# Patient Record
Sex: Female | Born: 1990
Health system: Southern US, Community
[De-identification: ages and names within clinical notes are randomized; demographics above are authoritative.]

## PROBLEM LIST (undated history)

## (undated) DIAGNOSIS — R51 Headache: Secondary | ICD-10-CM

## (undated) DIAGNOSIS — F419 Anxiety disorder, unspecified: Secondary | ICD-10-CM

## (undated) DIAGNOSIS — K589 Irritable bowel syndrome without diarrhea: Secondary | ICD-10-CM

## (undated) DIAGNOSIS — N939 Abnormal uterine and vaginal bleeding, unspecified: Secondary | ICD-10-CM

## (undated) DIAGNOSIS — D8989 Other specified disorders involving the immune mechanism, not elsewhere classified: Secondary | ICD-10-CM

## (undated) DIAGNOSIS — G43909 Migraine, unspecified, not intractable, without status migrainosus: Secondary | ICD-10-CM

## (undated) DIAGNOSIS — E282 Polycystic ovarian syndrome: Secondary | ICD-10-CM

## (undated) DIAGNOSIS — J309 Allergic rhinitis, unspecified: Secondary | ICD-10-CM

## (undated) DIAGNOSIS — R519 Headache, unspecified: Secondary | ICD-10-CM

## (undated) HISTORY — DX: Polycystic ovarian syndrome: E28.2

## (undated) HISTORY — DX: Other specified disorders involving the immune mechanism, not elsewhere classified: D89.89

## (undated) HISTORY — PX: TONSILLECTOMY: SUR1361

## (undated) HISTORY — DX: Headache: R51

## (undated) HISTORY — DX: Migraine, unspecified, not intractable, without status migrainosus: G43.909

## (undated) HISTORY — DX: Headache, unspecified: R51.9

## (undated) HISTORY — DX: Allergic rhinitis, unspecified: J30.9

---

## 2009-01-06 ENCOUNTER — Emergency Department: Payer: Self-pay | Admitting: Emergency Medicine

## 2009-01-30 ENCOUNTER — Emergency Department: Payer: Self-pay | Admitting: Emergency Medicine

## 2009-03-18 HISTORY — PX: TONSILLECTOMY: SUR1361

## 2016-07-01 DIAGNOSIS — F32A Depression, unspecified: Secondary | ICD-10-CM | POA: Insufficient documentation

## 2016-09-10 ENCOUNTER — Encounter (HOSPITAL_COMMUNITY): Payer: Self-pay

## 2016-09-10 ENCOUNTER — Emergency Department (HOSPITAL_COMMUNITY)
Admission: EM | Admit: 2016-09-10 | Discharge: 2016-09-10 | Disposition: A | Discharge status: Home / Self Care | Payer: PRIVATE HEALTH INSURANCE | Attending: Emergency Medicine | Admitting: Emergency Medicine

## 2016-09-10 DIAGNOSIS — M1990 Unspecified osteoarthritis, unspecified site: Secondary | ICD-10-CM | POA: Diagnosis not present

## 2016-09-10 DIAGNOSIS — M79673 Pain in unspecified foot: Secondary | ICD-10-CM

## 2016-09-10 DIAGNOSIS — M26629 Arthralgia of temporomandibular joint, unspecified side: Secondary | ICD-10-CM | POA: Diagnosis present

## 2016-09-10 DIAGNOSIS — M79643 Pain in unspecified hand: Secondary | ICD-10-CM

## 2016-09-10 HISTORY — DX: Anxiety disorder, unspecified: F41.9

## 2016-09-10 HISTORY — DX: Irritable bowel syndrome, unspecified: K58.9

## 2016-09-10 MED ORDER — ACETAMINOPHEN 500 MG PO TABS
1000.0000 mg | ORAL_TABLET | Freq: Once | ORAL | Status: AC
  Administered 2016-09-10: 1000 mg via ORAL
  Filled 2016-09-10: qty 2

## 2016-09-10 MED ORDER — OXYCODONE HCL 5 MG PO TABS
5.0000 mg | ORAL_TABLET | Freq: Once | ORAL | Status: AC
  Administered 2016-09-10: 5 mg via ORAL
  Filled 2016-09-10: qty 1

## 2016-09-10 MED ORDER — KETOROLAC TROMETHAMINE 60 MG/2ML IM SOLN
15.0000 mg | Freq: Once | INTRAMUSCULAR | Status: AC
  Administered 2016-09-10: 15 mg via INTRAMUSCULAR
  Filled 2016-09-10: qty 2

## 2016-09-10 NOTE — ED Provider Notes (Signed)
[facility]-EMER[facility]ENCY DEPT Provider Note   CSN: [id number] Arrival date & time: 09/10/16  1904     History   Chief Complaint Chief Complaint  Patient presents with  . Temporomandibular Joint Pain    HPI Jennifer Vincent is a 26 y.o. female.  26 yo F with a chief complaint of hand and foot pain. This been going on for the past week or so. Denies fevers or chills. Denies rash. Denies sore throat. Denies sick contacts. Denies vomiting or diarrhea. Denies chest pain shortness breath abdominal pain. Denies prior to exposure. She called her father who is a physician who told her that she needed to see someone for this. She went to urgent care and they sent her here for evaluation.   The history is provided by the patient and the spouse.  Illness  This is a new problem. The current episode started more than 1 week ago. The problem occurs constantly. The problem has been gradually worsening. Pertinent negatives include no chest pain, no headaches and no shortness of breath. Nothing aggravates the symptoms. Nothing relieves the symptoms. She has tried nothing for the symptoms. The treatment provided no relief.    Past Medical History:  Diagnosis Date  . Anxiety   . IBS (irritable bowel syndrome)     There are no active problems to display for this patient.   Past Surgical History:  Procedure Laterality Date  . TONSILLECTOMY      OB History    No data available       Home Medications    Prior to Admission medications   Not on File    Family History No family history on file.  Social History Social History  Substance Use Topics  . Smoking status: Never Smoker  . Smokeless tobacco: Never Used  . Alcohol use Yes     Allergies   Patient has no allergy information on record.   Review of Systems Review of Systems  Constitutional: Negative for chills and fever.  HENT: Negative for congestion and rhinorrhea.   Eyes: Negative for redness and visual disturbance.  Respiratory:  Negative for shortness of breath and wheezing.   Cardiovascular: Negative for chest pain and palpitations.  Gastrointestinal: Negative for nausea and vomiting.  Genitourinary: Negative for dysuria and urgency.  Musculoskeletal: Negative for arthralgias and myalgias.  Skin: Negative for pallor and wound.  Neurological: Negative for dizziness and headaches.     Physical Exam Updated Vital Signs BP 119/77   Pulse 88   Temp 98.8 F (37.1 C) (Oral)   Resp 16   LMP 08/27/2016   SpO2 99%   Physical Exam  Constitutional: She is oriented to person, place, and time. She appears well-developed and well-nourished. No distress.  HENT:  Head: Normocephalic and atraumatic.  Eyes: EOM are normal. Pupils are equal, round, and reactive to light.  Neck: Normal range of motion. Neck supple.  Cardiovascular: Normal rate and regular rhythm.  Exam reveals no gallop and no friction rub.   No murmur heard. Pulmonary/Chest: Effort normal. She has no wheezes. She has no rales.  Abdominal: Soft. She exhibits no distension and no mass. There is no tenderness. There is no guarding.  Musculoskeletal: She exhibits no edema or tenderness.  Mild erythema to the palms and soles. No overt rash. No pain with range of motion on exam. No edema.  Neurological: She is alert and oriented to person, place, and time.  Skin: Skin is warm and dry. She is not diaphoretic.  Psychiatric:  She has a normal mood and affect. Her behavior is normal.  Nursing note and vitals reviewed.    ED Treatments / Results  Labs (all labs ordered are listed, but only abnormal results are displayed) Labs Reviewed - No data to display  EKG  EKG Interpretation  Date/Time:  Tuesday September 10 2016 19:09:40 EDT Ventricular Rate:  105 PR Interval:  146 QRS Duration: 74 QT Interval:  332 QTC Calculation: 438 R Axis:   40 Text Interpretation:  Sinus tachycardia Biatrial enlargement Low voltage QRS Abnormal ECG No significant change since  last tracing Confirmed by Melene Plan [phone]) on 09/10/2016 9:01:54 PM       Radiology No results found.  Procedures Procedures (including critical care time)  Medications Ordered in ED Medications  ketorolac (TORADOL) injection 15 mg (15 mg Intramuscular Given 09/10/16 2114)  acetaminophen (TYLENOL) tablet 1,000 mg (1,000 mg Oral Given 09/10/16 2114)  oxyCODONE (Oxy IR/ROXICODONE) immediate release tablet 5 mg (5 mg Oral Given 09/10/16 2114)     Initial Impression / Assessment and Plan / ED Course  I have reviewed the triage vital signs and the nursing notes.  Pertinent labs & imaging results that were available during my care of the patient were reviewed by me and considered in my medical decision making (see chart for details).     26 yo F With a chief complaints of hand and foot pain. Going on for the past week. Family history of arthritis. Systemically is well-appearing. Was significantly tachycardic in triage but resolved by my exam. EKG with sinus tachycardia. Discussed possible courses of care with the patient. At this point she says no signs of infection, no thought to exposure. No large joint involvement. I am unsure of the etiology of lab eval in the ED.    Referred to rheumatology. The patient also needs to establish with a primary care physician. Tylenol and NSAIDs. Offered course of steroids which she is declining prior to lab eval.  9:36 PM:  I have discussed the diagnosis/risks/treatment options with the patient and family and believe the pt to be eligible for discharge home to follow-up with Rheum, PCP. We also discussed returning to the ED immediately if new or worsening sx occur. We discussed the sx which are most concerning (e.g., sudden worsening pain, rash, fever) that necessitate immediate return. Medications administered to the patient during their visit and any new prescriptions provided to the patient are listed below.  Medications given during this  visit Medications  ketorolac (TORADOL) injection 15 mg (15 mg Intramuscular Given 09/10/16 2114)  acetaminophen (TYLENOL) tablet 1,000 mg (1,000 mg Oral Given 09/10/16 2114)  oxyCODONE (Oxy IR/ROXICODONE) immediate release tablet 5 mg (5 mg Oral Given 09/10/16 2114)     The patient appears reasonably screen and/or stabilized for discharge and I doubt any other medical condition or other [facility] requiring further screening, evaluation, or treatment in the ED at this time prior to discharge.    Final Clinical Impressions(s) / ED Diagnoses   Final diagnoses:  Hand and foot pain, unspecified laterality  Arthritis    New Prescriptions New Prescriptions   No medications on file     Melene Plan, DO 09/10/16 2136

## 2016-09-10 NOTE — ED Notes (Signed)
Pt verbalized understanding of d/c instructions and has no further questions. Pt stable and NAD. VSS. Pt to follow up with rheumatologist and pcp.

## 2016-09-10 NOTE — ED Triage Notes (Signed)
Pt reports joint pain to her wrists, ankles and knees, onset 4-5 days ago but today was the worst and unrelieved with ibuprofen. She reports she went to urgent care and they sent her here. She reports feelings very anxious and panicking and reports shortness of breath but states she thinks its because of her anxiety.

## 2016-09-10 NOTE — Discharge Instructions (Signed)
Take 4 over the counter ibuprofen tablets 3 times a day or 2 over-the-counter naproxen tablets twice a day for pain. Also take tylenol 1000mg(2 extra strength) four times a day.    

## 2016-09-20 ENCOUNTER — Encounter: Payer: Self-pay | Admitting: Family Medicine

## 2016-09-20 ENCOUNTER — Ambulatory Visit (INDEPENDENT_AMBULATORY_CARE_PROVIDER_SITE_OTHER): Payer: PRIVATE HEALTH INSURANCE | Admitting: Family Medicine

## 2016-09-20 VITALS — BP 110/82 | HR 79 | Temp 98.6°F | Ht 67.5 in | Wt 192.3 lb

## 2016-09-20 DIAGNOSIS — F419 Anxiety disorder, unspecified: Secondary | ICD-10-CM

## 2016-09-20 DIAGNOSIS — M2550 Pain in unspecified joint: Secondary | ICD-10-CM

## 2016-09-20 DIAGNOSIS — F329 Major depressive disorder, single episode, unspecified: Secondary | ICD-10-CM

## 2016-09-20 DIAGNOSIS — F32A Depression, unspecified: Secondary | ICD-10-CM

## 2016-09-20 DIAGNOSIS — Z7689 Persons encountering health services in other specified circumstances: Secondary | ICD-10-CM | POA: Diagnosis not present

## 2016-09-20 DIAGNOSIS — G43809 Other migraine, not intractable, without status migrainosus: Secondary | ICD-10-CM

## 2016-09-20 LAB — VITAMIN D 25 HYDROXY (VIT D DEFICIENCY, FRACTURES): VITD: 33.58 ng/mL (ref 30.00–100.00)

## 2016-09-20 LAB — CBC WITH DIFFERENTIAL/PLATELET
BASOS ABS: 0.1 10*3/uL (ref 0.0–0.1)
Basophils Relative: 1.1 % (ref 0.0–3.0)
Eosinophils Absolute: 0 10*3/uL (ref 0.0–0.7)
Eosinophils Relative: 0.7 % (ref 0.0–5.0)
HEMATOCRIT: 41.7 % (ref 36.0–46.0)
HEMOGLOBIN: 14.1 g/dL (ref 12.0–15.0)
LYMPHS PCT: 33 % (ref 12.0–46.0)
Lymphs Abs: 1.9 10*3/uL (ref 0.7–4.0)
MCHC: 33.9 g/dL (ref 30.0–36.0)
MCV: 92.2 fl (ref 78.0–100.0)
Monocytes Absolute: 0.3 10*3/uL (ref 0.1–1.0)
Monocytes Relative: 5.9 % (ref 3.0–12.0)
Neutro Abs: 3.4 10*3/uL (ref 1.4–7.7)
Neutrophils Relative %: 59.3 % (ref 43.0–77.0)
Platelets: 233 10*3/uL (ref 150.0–400.0)
RBC: 4.52 Mil/uL (ref 3.87–5.11)
RDW: 13.1 % (ref 11.5–15.5)
WBC: 5.7 10*3/uL (ref 4.0–10.5)

## 2016-09-20 LAB — BASIC METABOLIC PANEL
BUN: 9 mg/dL (ref 6–23)
CALCIUM: 9.6 mg/dL (ref 8.4–10.5)
CO2: 29 mEq/L (ref 19–32)
Chloride: 103 mEq/L (ref 96–112)
Creatinine, Ser: 0.78 mg/dL (ref 0.40–1.20)
GFR: 94.34 mL/min (ref >60.00)
Glucose, Bld: 92 mg/dL (ref 70–99)
Potassium: 4.3 mEq/L (ref 3.5–5.1)
SODIUM: 138 meq/L (ref 135–145)

## 2016-09-20 LAB — TSH: TSH: 0.93 u[IU]/mL (ref 0.35–4.50)

## 2016-09-20 MED ORDER — SERTRALINE HCL 25 MG PO TABS: 25.0000 mg | ORAL_TABLET | Freq: Every day | ORAL | 0 refills | Status: DC

## 2016-09-20 MED ORDER — LORAZEPAM 0.5 MG PO TABS: 0.2500 mg | ORAL_TABLET | Freq: Three times a day (TID) | ORAL | 0 refills | Status: DC | PRN

## 2016-09-20 NOTE — Patient Instructions (Signed)
BEFORE YOU LEAVE: -xray sheet -follow up:  4-6 weeks -labs  Go get the xray of the hand.  Call today to set up counseling appointment.  Start slow taper off of the zoloft.  Reserve the ativan for rare use for panic attacks as needed. I do not prescribe this for regular use in adults.  We have ordered labs or studies at this visit. It can take up to 1-2 weeks for results and processing. IF results require follow up or explanation, we will call you with instructions. Clinically stable results will be released to your [facility]. If you have not heard from us or cannot find your results in [facility] in 2 weeks please contact our office at [phone].  If you are not yet signed up for [facility], please consider signing up.  For celiac testing you will need to eat wheat daily for at least 8 weeks.

## 2016-09-20 NOTE — Progress Notes (Signed)
HPI:  Renny Gunnarson is here to establish care.  Last PCP and physical: last gyn physical in April.   Has the following chronic problems that require follow up and concerns today:  Anxiety/Depression: -diagnosed about 8 years ago -gynecologist was prescribing zoloft -anxiety worse this year, worse the last few weeks - had panic attack -no thoughts of harm to self or others, hallucination, hx bipolar d/o or hospitalizations -not seeing a counselor of pscyhologist  Wrist and finger pain: -on and off for 4-5 years -severe a few weeks ago and went to the ER - got toradol and now doing better -pain in hands, wrist jts, ankles and knees -a few times per year, lasts a few weeks when occurs -notices some swelling -her brother has ankylosing spondylosis and her dad has "arthritis" -denies fever, malaise, rashes, tick bites -she did feel tired with most recent episode by was prior to period -normally gets regular exercise  Migraines: -on topamax in the past - made her feel lightheaded -now well controlled and off medications - OCPs helped  IBS: -sees Dr. Jason Fila in GI in [place] -on linzess - this has done well  ROS negative for unless reported above: fevers, unintentional weight loss, hearing or vision loss, chest pain, palpitations, struggling to breath, hemoptysis, melena, hematochezia, hematuria, falls, loc, si, thoughts of self harm  Past Medical History:  Diagnosis Date  . Anxiety   . Frequent headaches   . IBS (irritable bowel syndrome)   . PCOS (polycystic ovarian syndrome)     Past Surgical History:  Procedure Laterality Date  . TONSILLECTOMY      Family History  Problem Relation Age of Onset  . Mental illness Mother   . Arthritis Father   . Arthritis Brother   . Alcohol abuse Brother   . Mental illness Brother   . Mental illness Maternal Grandfather   . Heart disease Paternal Grandfather   . Diabetes Paternal Grandfather     Social History   Social History   . Marital status: Single    Spouse name: N/A  . Number of children: N/A  . Years of education: N/A   Social History Main Topics  . Smoking status: Never Smoker  . Smokeless tobacco: Never Used  . Alcohol use Yes     Comment: 1x per week, 1 drink  . Drug use: Unknown  . Sexual activity: Yes    Birth control/ protection: OCP   Other Topics Concern  . 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  linaclotide (LINZESS) 290 MCG CAPS capsule, Take by mouth., Disp: , Rfl:  .  Norgestimate-Ethinyl Estradiol Triphasic (TRI-PREVIFEM) 0.18/0.215/0.25 MG-35 MCG tablet, Take 1 tablet by mouth daily., Disp: , Rfl:  .  LORazepam (ATIVAN) 0.5 MG tablet, Take 0.5 tablets (0.25 mg total) by mouth every 8 (eight) hours as needed (panic attack)., Disp: 10 tablet, Rfl: 0 .  sertraline (ZOLOFT) 25 MG tablet, Take 1 tablet (25 mg total) by mouth daily., Disp: 60 tablet, Rfl: 0  EXAM:  Vitals:   09/20/16 1311  BP: 110/82  Pulse: 79  Temp: 98.6 F (37 C)    Body mass index is 29.67 kg/m.  GENERAL: vitals reviewed and listed  above, alert, oriented, appears well hydrated and in no acute distress  HEENT: atraumatic, conjunttiva clear, no obvious abnormalities on inspection of external nose and ears  NECK: no obvious masses on inspection  LUNGS: clear to auscultation bilaterally, no wheezes, rales or rhonchi, good air movement  CV: HRRR, no peripheral edema  MS: moves all extremities without noticeable abnormality, she has some TTP around her wrist and mainly 1st mcp jts, no appreciable swelling, erythema or warmth or TTP of large joints  PSYCH: pleasant and cooperative, no obvious depression or anxiety  ASSESSMENT AND PLAN:  Discussed the following assessment and  plan:  Anxiety and depression - Plan: sertraline (ZOLOFT) 25 MG tablet -discussed various treatment options, risks -she opted for: CBT, taper of zoloft, prn ativan for panic only (knows I do not rx for regular use and understand risks), then perhaps trying prozac or paxil -close follow up  Polyarthralgia - Plan: Basic metabolic panel, CBC with Differential/Platelet, TSH, VITAMIN D 25 Hydroxy (Vit-D Deficiency, Fractures), Cyclic citrul peptide antibody, IgG, Rheumatoid Factor, DG Hand Complete Right -will get labs per orders, xray R hand, consider rheum referral, would also like to screen for celiac sprue but has been avoiding gluten - will check after 8 weeks of wheat daily  Other migraine without status migrainosus, not intractable -well controlled daily  Encounter to establish care -We reviewed the PMH, PSH, FH, SH, Meds and Allergies. -We provided refills for any medications we will prescribe as needed. -We addressed current concerns per orders and patient instructions. -follow up per below   Patient Instructions  BEFORE YOU LEAVE: -xray sheet -follow up:  4-6 weeks -labs  Go get the xray of the hand.  Call today to set up counseling appointment.  Start slow taper off of the zoloft.  Reserve the ativan for rare use for panic attacks as needed. I do not prescribe this for regular use in adults.  We have ordered labs or studies at this visit. It can take up to 1-2 weeks for results and processing. IF results require follow up or explanation, we will call you with instructions. Clinically stable results will be released to your [facility]. If you have not heard from [place] or cannot find your results in [facility] in 2 weeks please contact our office at [phone].  If you are not yet signed up for [facility], please consider signing up.  For celiac testing you will need to eat wheat daily for at least 8 weeks.           Kriste BasqueKIM, Daxon Kyne R.

## 2016-09-23 ENCOUNTER — Ambulatory Visit (INDEPENDENT_AMBULATORY_CARE_PROVIDER_SITE_OTHER)
Admission: RE | Admit: 2016-09-23 | Discharge: 2016-09-23 | Disposition: A | Discharge status: Home / Self Care | Payer: PRIVATE HEALTH INSURANCE | Source: Ambulatory Visit | Attending: Family Medicine | Admitting: Family Medicine

## 2016-09-23 DIAGNOSIS — M2550 Pain in unspecified joint: Secondary | ICD-10-CM

## 2016-09-23 LAB — RHEUMATOID FACTOR

## 2016-09-23 LAB — CYCLIC CITRUL PEPTIDE ANTIBODY, IGG: Cyclic Citrullin Peptide Ab: <16 Units

## 2016-09-26 ENCOUNTER — Encounter: Payer: Self-pay | Admitting: Family Medicine

## 2016-10-01 ENCOUNTER — Ambulatory Visit (INDEPENDENT_AMBULATORY_CARE_PROVIDER_SITE_OTHER): Payer: PRIVATE HEALTH INSURANCE | Admitting: Clinical

## 2016-10-01 DIAGNOSIS — F410 Panic disorder [episodic paroxysmal anxiety] without agoraphobia: Secondary | ICD-10-CM

## 2016-10-04 ENCOUNTER — Ambulatory Visit (INDEPENDENT_AMBULATORY_CARE_PROVIDER_SITE_OTHER): Payer: PRIVATE HEALTH INSURANCE | Admitting: Family Medicine

## 2016-10-04 ENCOUNTER — Encounter: Payer: Self-pay | Admitting: Family Medicine

## 2016-10-04 VITALS — BP 110/80 | HR 95 | Temp 98.3°F | Ht 67.5 in | Wt 187.9 lb

## 2016-10-04 DIAGNOSIS — F410 Panic disorder [episodic paroxysmal anxiety] without agoraphobia: Secondary | ICD-10-CM | POA: Diagnosis not present

## 2016-10-04 DIAGNOSIS — F419 Anxiety disorder, unspecified: Secondary | ICD-10-CM | POA: Insufficient documentation

## 2016-10-04 NOTE — Progress Notes (Signed)
HPI:  Acute visit for anxiety: -worsening she feel since her gynecologist upped her zoloft and she wants to wean off -symptoms are: several panic attacks per week, feeling on edge and constant fear/anxiety about having another panic attack and feeling like she should be abl to manage this better -she was so afraid of having a panic attack that she did not go to work one day this week -she is afraid to use the benzo and become addicted - used 1/2 tab twice and it did not help, use one tab once for panic attach and it did help -decreased lexapro yesterday to 100mg  -lovers her job, life in general right now, happy and enjoys fun activities -no SI, thoughts of harm to self or other, depressed mood, manic symptoms -saw Psychologist - Eileen StanfordJenna a few days ago and felt really good about this and has done better since -thinks should see psychiatry as well as psychologist -hx of depression but she does not feel she has depression right now  ROS: See pertinent positives and negatives per HPI.  Past Medical History:  Diagnosis Date  . Anxiety   . Frequent headaches   . IBS (irritable bowel syndrome)   . PCOS (polycystic ovarian syndrome)     Past Surgical History:  Procedure Laterality Date  . TONSILLECTOMY      Family History  Problem Relation Age of Onset  . Mental illness Mother   . Arthritis Father   . Arthritis Brother   . Alcohol abuse Brother   . Mental illness Brother   . Mental illness Maternal Grandfather   . Heart disease Paternal Grandfather   . Diabetes Paternal Grandfather     Social History   Social History  . Marital status: Single    Spouse name: N/A  . Number of children: N/A  . Years of education: N/A   Social History Main Topics  . Smoking status: Never Smoker  . Smokeless tobacco: Never Used  . Alcohol use Yes     Comment: 1x per week, 1 drink  . Drug use: Unknown  . Sexual activity: Yes    Birth control/ protection: OCP   Other Topics Concern  .  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  linaclotide (LINZESS) 290 MCG CAPS capsule, Take by mouth., Disp: , Rfl:  .  LORazepam (ATIVAN) 0.5 MG tablet, Take 0.5 tablets (0.25 mg total) by mouth every 8 (eight) hours as needed (panic attack)., Disp: 10 tablet, Rfl: 0 .  Norgestimate-Ethinyl Estradiol Triphasic (TRI-PREVIFEM) 0.18/0.215/0.25 MG-35 MCG tablet, Take 1 tablet by mouth daily., Disp: , Rfl:  .  sertraline (ZOLOFT) 100 MG tablet, Take 150 mg by mouth daily., Disp: , Rfl:   EXAM:  Vitals:   10/04/16 1304  BP: 110/80  Pulse: 95  Temp: 98.3 F (36.8 C)    Body mass index is 28.99 kg/m.  GENERAL: vitals reviewed and listed above, alert, oriented, appears well hydrated and in no acute distress  HEENT: atraumatic, conjunttiva clear, no obvious abnormalities on inspection of external nose and ears  NECK: no obvious masses on inspection  LUNGS: clear to auscultation bilaterally, no wheezes, rales or rhonchi, good air movement  CV: HRRR, no peripheral edema  MS: moves all extremities without noticeable abnormality  PSYCH: pleasant and cooperative, no obvious depression, somewhat anxious in demeanor  ASSESSMENT AND PLAN:  Discussed the following assessment and plan: More than 50% of over 25 minutes spent in total in caring for this patient was spent face-to-face with the patient, counseling and/or coordinating care.    Anxiety  Panic disorder  -continue counseling -she plans to schedule appt with psychiatry -discussed ways to think through panic attack and she wants to try to get thru some on her own and plans to discuss this more with Eileen Stanford -I think it would be safe and ok to use the benzo for every other panic attack or a few times per week if  it helps and she may try this -she really wants to come off zoloft so plans to slowly taper -follow up here in interim as needed -Patient advised to return or notify a doctor immediately if symptoms worsen or persist or new concerns arise.  Patient Instructions  Call to schedule psychiatry appointment.  Continue work with [place].  Ativan as needed for panic attacks - though try to work through at least every other attack without the medication if possible.  Keep up with all of the good things you are doing, exercise, yoga, reading...etc!  I hope you are feeling better soon! Seek care immediately if worsening, new concerns or you are not improving with treatment.     Kriste Basque R., DO

## 2016-10-04 NOTE — Patient Instructions (Signed)
Call to schedule psychiatry appointment.  Continue work with [place].  Ativan as needed for panic attacks - though try to work through at least every other attack without the medication if possible.  Keep up with all of the good things you are doing, exercise, yoga, reading...etc!  I hope you are feeling better soon! Seek care immediately if worsening, new concerns or you are not improving with treatment.

## 2016-10-11 ENCOUNTER — Other Ambulatory Visit: Payer: Self-pay | Admitting: Family Medicine

## 2016-10-14 ENCOUNTER — Ambulatory Visit (INDEPENDENT_AMBULATORY_CARE_PROVIDER_SITE_OTHER): Payer: PRIVATE HEALTH INSURANCE | Admitting: Clinical

## 2016-10-14 ENCOUNTER — Ambulatory Visit: Payer: PRIVATE HEALTH INSURANCE | Admitting: Clinical

## 2016-10-14 DIAGNOSIS — F410 Panic disorder [episodic paroxysmal anxiety] without agoraphobia: Secondary | ICD-10-CM | POA: Diagnosis not present

## 2016-10-14 NOTE — Telephone Encounter (Signed)
Printed Rx was called in to [facility].

## 2016-10-25 ENCOUNTER — Ambulatory Visit: Payer: PRIVATE HEALTH INSURANCE | Admitting: Family Medicine

## 2016-10-28 ENCOUNTER — Ambulatory Visit (INDEPENDENT_AMBULATORY_CARE_PROVIDER_SITE_OTHER): Payer: PRIVATE HEALTH INSURANCE | Admitting: Clinical

## 2016-10-28 DIAGNOSIS — F410 Panic disorder [episodic paroxysmal anxiety] without agoraphobia: Secondary | ICD-10-CM | POA: Diagnosis not present

## 2016-11-11 ENCOUNTER — Ambulatory Visit (INDEPENDENT_AMBULATORY_CARE_PROVIDER_SITE_OTHER): Payer: PRIVATE HEALTH INSURANCE | Admitting: Clinical

## 2016-11-11 DIAGNOSIS — F410 Panic disorder [episodic paroxysmal anxiety] without agoraphobia: Secondary | ICD-10-CM | POA: Diagnosis not present

## 2016-11-22 ENCOUNTER — Ambulatory Visit (INDEPENDENT_AMBULATORY_CARE_PROVIDER_SITE_OTHER): Payer: PRIVATE HEALTH INSURANCE | Admitting: Clinical

## 2016-11-22 DIAGNOSIS — F410 Panic disorder [episodic paroxysmal anxiety] without agoraphobia: Secondary | ICD-10-CM | POA: Diagnosis not present

## 2016-12-05 ENCOUNTER — Encounter: Payer: Self-pay | Admitting: Family Medicine

## 2017-02-05 ENCOUNTER — Ambulatory Visit (INDEPENDENT_AMBULATORY_CARE_PROVIDER_SITE_OTHER): Payer: PRIVATE HEALTH INSURANCE | Admitting: Clinical

## 2017-02-05 DIAGNOSIS — F410 Panic disorder [episodic paroxysmal anxiety] without agoraphobia: Secondary | ICD-10-CM | POA: Diagnosis not present

## 2017-02-14 ENCOUNTER — Ambulatory Visit: Payer: Self-pay | Admitting: Family Medicine

## 2017-02-27 ENCOUNTER — Encounter: Payer: Self-pay | Admitting: Family Medicine

## 2017-02-27 ENCOUNTER — Ambulatory Visit (INDEPENDENT_AMBULATORY_CARE_PROVIDER_SITE_OTHER): Payer: PRIVATE HEALTH INSURANCE | Admitting: Family Medicine

## 2017-02-27 VITALS — BP 112/88 | HR 100 | Temp 98.8°F | Ht 67.5 in | Wt 193.4 lb

## 2017-02-27 DIAGNOSIS — Z3009 Encounter for other general counseling and advice on contraception: Secondary | ICD-10-CM | POA: Diagnosis not present

## 2017-02-27 DIAGNOSIS — R030 Elevated blood-pressure reading, without diagnosis of hypertension: Secondary | ICD-10-CM

## 2017-02-27 NOTE — Progress Notes (Signed)
HPI:  Jennifer Vincent is a very pleasant 26 year old here for an acute visit discuss contraception.  Is been on birth control pills since she was a teenager.  She was diagnosed with polycystic ovarian syndrome as a teenager.  Had irregular periods.  However, when she is on birth control she notices her blood pressure is higher.  She wants to stop her birth control as she and her husband are thinking about conceiving.  She does not have an obstetrician in town.  She wonders how long after stopping the birth control she would have before she would get pregnant.  He does not plan on getting pregnant immediately and plans to use condoms for now.  She wants my recommendations on obstetricians in town and she would like to get plugged in for a preconception visit.    ROS: See pertinent positives and negatives per HPI.  Past Medical History:  Diagnosis Date  . Anxiety   . Frequent headaches   . IBS (irritable bowel syndrome)   . PCOS (polycystic ovarian syndrome)     Past Surgical History:  Procedure Laterality Date  . TONSILLECTOMY      Family History  Problem Relation Age of Onset  . Mental illness Mother   . Arthritis Father   . Arthritis Brother   . Alcohol abuse Brother   . Mental illness Brother   . Mental illness Maternal Grandfather   . Heart disease Paternal Grandfather   . Diabetes Paternal Grandfather     Social History   Socioeconomic History  . Marital status: Married    Spouse name: None  . Number of children: None  . Years of education: None  . Highest education level: None  Social Needs  . Financial resource strain: None  . Food insecurity - worry: None  . Food insecurity - inability: None  . Transportation needs - medical: None  . Transportation needs - non-medical: None  Occupational History  . None  Tobacco Use  . Smoking status: Never Smoker  . Smokeless tobacco: Never Used  Substance and Sexual Activity  . Alcohol use: Yes    Comment: 1x per week, 1  drink  . Drug use: None  . Sexual activity: Yes    Birth control/protection: OCP  Other Topics Concern  . 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  busPIRone (BUSPAR) 15 MG tablet, Take 15 mg by mouth 2 (two) times daily. , Disp: , Rfl: 0 .  linaclotide (LINZESS) 290 MCG CAPS capsule, Take by mouth., Disp: , Rfl:  .  LORazepam (ATIVAN) 0.5 MG tablet, TAKE 1/2 TABLET BY MOUTH EVERY 8 HOURS AS NEEDED FOR PANIC ATTACK, Disp: 10 tablet, Rfl: 0 .  Norgestimate-Ethinyl Estradiol Triphasic (TRI-PREVIFEM) 0.18/0.215/0.25 MG-35 MCG tablet, Take 1 tablet by mouth daily., Disp: , Rfl:  .  sertraline (ZOLOFT) 100 MG tablet, Take 150 mg by mouth daily., Disp: , Rfl:   EXAM:  Vitals:   02/27/17 0838  BP: 112/88  Pulse: 100  Temp: 98.8 F (37.1 C)    Body mass index is 29.84 kg/m.  GENERAL: vitals reviewed and listed above, alert, oriented, appears well hydrated and in no acute distress  HEENT: atraumatic, conjunttiva clear, no obvious abnormalities on inspection of  external nose and ears  NECK: no obvious masses on inspection  LUNGS: clear to auscultation bilaterally, no wheezes, rales or rhonchi, good air movement  CV: HRRR, no peripheral edema  MS: moves all extremities without noticeable abnormality  PSYCH: pleasant and cooperative, no obvious depression or anxiety  ASSESSMENT AND PLAN:  Discussed the following assessment and plan:  Elevated blood pressure reading  Contraceptive use education  -Discussed various birth control options and consideration of stopping birth control -Provided her with a list of options for obstetrical care in town for preconception counseling -She has decided to go ahead and stop the birth control and condoms for now -Follow-up in 3  months here to recheck her blood pressure -Patient advised to return or notify a doctor immediately if symptoms worsen or persist or new concerns arise.  There are no Patient Instructions on file for this visit.  Kriste BasqueKIM, HANNAH R., DO

## 2017-03-05 ENCOUNTER — Ambulatory Visit: Payer: PRIVATE HEALTH INSURANCE | Admitting: Clinical

## 2017-03-27 ENCOUNTER — Encounter: Payer: Self-pay | Admitting: Family Medicine

## 2017-05-29 ENCOUNTER — Ambulatory Visit: Payer: Self-pay | Admitting: Family Medicine

## 2017-07-04 MED FILL — ALPRAZolam 0.5 MG TABS: 0.5 | 30 days supply | Qty: 30 | Fill #0

## 2017-07-04 MED FILL — busPIRone HCL 15 MG TABS: 15 | 30 days supply | Qty: 60 | Fill #0

## 2017-07-04 MED FILL — SERTRALINE HCL 100 MG TAB: 100 | 90 days supply | Qty: 90 | Fill #0

## 2017-07-04 MED FILL — LINZESS 290 MCG CAPSULE: 290 | 90 days supply | Qty: 90 | Fill #0

## 2017-08-06 MED FILL — busPIRone HCL 15 MG TABS: 15 | 30 days supply | Qty: 60 | Fill #1

## 2017-09-04 MED FILL — busPIRone HCL 15 MG TABS: 15 | 90 days supply | Qty: 180 | Fill #2

## 2017-09-15 MED FILL — ALPRAZolam 0.5 MG TABS: 0.5 | 30 days supply | Qty: 30 | Fill #1

## 2017-10-14 MED FILL — LINZESS 290 MCG CAPSULE: 290 | 90 days supply | Qty: 90 | Fill #1

## 2017-11-04 MED FILL — ALPRAZolam 0.5 MG TABS: 0.5 | 30 days supply | Qty: 60 | Fill #0

## 2017-11-11 ENCOUNTER — Telehealth: Payer: Self-pay | Admitting: Family Medicine

## 2017-11-11 NOTE — Telephone Encounter (Signed)
Give her my congrats. We have not seen her in some time so would recommend she schedule with her treating obstetriction to confirm and discuss. I don't believe we prescribe any of these for her? I recommend that she discuss with prescribers and her obstetrician to better weigh risks/benefits. buspar and linzess often can be continued. zoloft is sometimes continued based on risks vs benefits.

## 2017-11-11 NOTE — Telephone Encounter (Signed)
I called the pt and informed her of the message below

## 2017-11-11 NOTE — Telephone Encounter (Signed)
Copied from CRM [id number]. Topic: General - Other >> Nov 11, 2017  2:42 PM Mcneil, Ja-Kwan wrote: Reason for CRM:  Pt states she believes she may be pregnant and she would like to know if she should continue taking the following medications: linaclotide (LINZESS) 290 MCG CAPS capsule, busPIRone (BUSPAR) 15 MG tablet,and sertraline (ZOLOFT) 100 MG tablet. Pt requests call back. Cb# [phone]

## 2017-11-12 MED FILL — HYDROXYZINE PAM 25 MG CAP: 25 | 22 days supply | Qty: 90 | Fill #0

## 2017-11-27 MED FILL — BONJESTA 20-20 MG TAB: 20-20 | 30 days supply | Qty: 30 | Fill #0

## 2017-12-03 LAB — OB RESULTS CONSOLE RUBELLA ANTIBODY, IGM: Rubella: IMMUNE

## 2017-12-03 LAB — OB RESULTS CONSOLE ABO/RH: RH Type: POSITIVE

## 2017-12-03 LAB — OB RESULTS CONSOLE HIV ANTIBODY (ROUTINE TESTING): HIV: NONREACTIVE

## 2017-12-03 LAB — OB RESULTS CONSOLE RPR: RPR: NONREACTIVE

## 2017-12-03 LAB — OB RESULTS CONSOLE ANTIBODY SCREEN: Antibody Screen: NEGATIVE

## 2017-12-03 LAB — OB RESULTS CONSOLE GC/CHLAMYDIA
Chlamydia: NEGATIVE
Gonorrhea: NEGATIVE

## 2017-12-03 LAB — OB RESULTS CONSOLE HEPATITIS B SURFACE ANTIGEN: Hepatitis B Surface Ag: NEGATIVE

## 2017-12-04 MED FILL — busPIRone HCL 15 MG TABS: 15 | 30 days supply | Qty: 60 | Fill #3

## 2017-12-30 MED FILL — CITRANATAL 90 DHA COMBO PAC: 90-1 & 300 | 30 days supply | Qty: 60 | Fill #0

## 2018-01-07 ENCOUNTER — Ambulatory Visit: Payer: PRIVATE HEALTH INSURANCE | Admitting: Psychiatry

## 2018-01-11 DIAGNOSIS — F419 Anxiety disorder, unspecified: Secondary | ICD-10-CM | POA: Insufficient documentation

## 2018-01-11 DIAGNOSIS — F411 Generalized anxiety disorder: Secondary | ICD-10-CM

## 2018-01-16 ENCOUNTER — Ambulatory Visit (INDEPENDENT_AMBULATORY_CARE_PROVIDER_SITE_OTHER): Payer: Self-pay | Admitting: Family Medicine

## 2018-01-16 ENCOUNTER — Encounter: Payer: Self-pay | Admitting: Family Medicine

## 2018-01-16 VITALS — BP 100/75 | HR 101 | Temp 98.7°F | Resp 18 | Wt 207.0 lb

## 2018-01-16 DIAGNOSIS — J029 Acute pharyngitis, unspecified: Secondary | ICD-10-CM

## 2018-01-16 LAB — POCT RAPID STREP A (OFFICE): RAPID STREP A SCREEN: NEGATIVE

## 2018-01-16 NOTE — Patient Instructions (Signed)

## 2018-01-16 NOTE — Progress Notes (Signed)
Jennifer Vincent is a 27 y.o. female who presents today with concerns of sore throat in the last 24 hours. She has not attempted to treat this condition with any medications and of note is [redacted] weeks pregnant.  Review of Systems  Constitutional: Negative for chills, fever and malaise/fatigue.  HENT: Positive for sore throat. Negative for congestion, ear discharge, ear pain and sinus pain.   Eyes: Negative.   Respiratory: Negative for cough, sputum production and shortness of breath.   Cardiovascular: Negative.  Negative for chest pain.  Gastrointestinal: Negative for abdominal pain, diarrhea, nausea and vomiting.  Genitourinary: Negative for dysuria, frequency, hematuria and urgency.  Musculoskeletal: Negative for myalgias.  Skin: Negative.   Neurological: Negative for headaches.  Endo/Heme/Allergies: Negative.   Psychiatric/Behavioral: Negative.     O: Vitals:   01/16/18 1210  BP: 100/75  Pulse: (!) 101  Resp: 18  Temp: 98.7 F (37.1 C)  SpO2: 98%     Physical Exam  Constitutional: She is oriented to person, place, and time. Vital signs are normal. She appears well-developed and well-nourished. She is active.  Non-toxic appearance. She does not have a sickly appearance.  HENT:  Head: Normocephalic.  Right Ear: Hearing, tympanic membrane, external ear and ear canal normal.  Left Ear: Hearing, tympanic membrane, external ear and ear canal normal.  Nose: Nose normal.  Mouth/Throat: Uvula is midline and oropharynx is clear and moist.  Neck: Normal range of motion. Neck supple.  Cardiovascular: Normal rate, regular rhythm, normal heart sounds and normal pulses.  Pulmonary/Chest: Effort normal and breath sounds normal.  Abdominal: Soft. Bowel sounds are normal.  Musculoskeletal: Normal range of motion.  Lymphadenopathy:       Head (right side): No submental and no submandibular adenopathy present.       Head (left side): No submental and no submandibular adenopathy present.    She has  no cervical adenopathy.  Neurological: She is alert and oriented to person, place, and time.  Psychiatric: She has a normal mood and affect.  Vitals reviewed.    A: 1. Sore throat    P: Discussed exam findings, diagnosis etiology and medication use and indications reviewed with patient. Follow- Up and discharge instructions provided. No emergent/urgent issues found on exam.  Patient verbalized understanding of information provided and agrees with plan of care (POC), all questions answered.  1. Sore throat - POCT rapid strep A Results for orders placed or performed in visit on 01/16/18 (from the past 24 hour(s))  POCT rapid strep A     Status: Normal   Collection Time: 01/16/18 12:24 PM  Result Value Ref Range   Rapid Strep A Screen Negative Negative   Discussed supportive care of warm salt water rinses, tylenol and throat lozenges. Reviewed symptoms for urgent emergent evaluation.   Other orders - BONJESTA 20-20 MG TBCR - Prenatal Vit-Fe Sulfate-FA (PRENATAL MULTIVIT-IRON PO); Take by mouth.

## 2018-01-23 ENCOUNTER — Ambulatory Visit: Payer: No Typology Code available for payment source | Admitting: Psychiatry

## 2018-01-23 ENCOUNTER — Encounter: Payer: Self-pay | Admitting: Psychiatry

## 2018-01-23 VITALS — BP 117/68 | HR 90

## 2018-01-23 DIAGNOSIS — F411 Generalized anxiety disorder: Secondary | ICD-10-CM

## 2018-01-23 MED ORDER — SERTRALINE HCL 100 MG PO TABS: 100.0000 mg | ORAL_TABLET | Freq: Every day | ORAL | 1 refills | Status: DC

## 2018-01-23 MED ORDER — BUSPIRONE HCL 15 MG PO TABS: 15.0000 mg | ORAL_TABLET | Freq: Two times a day (BID) | ORAL | 1 refills | Status: DC

## 2018-01-23 NOTE — Progress Notes (Signed)
Jennifer Vincent [id number] 09/16/1990 27 y.o.  Subjective:   Patient ID:  Jennifer Vincent is a 27 y.o. (DOB 01/12/1991) female.  Chief Complaint:  Chief Complaint  Patient presents with  . Follow-up    Anxiety    HPI Jennifer Vincent presents to the office today for follow-up of anxiety. She reports that her mood has improved and is ok overall other than brief "hormone shifts." She reports that her anxiety has been lower than it has been in a long time. She reports that she has continued Buspar 15 mg po BID. She denies any recent panic attacks. Reports occasional anxious thoughts but no longer having physical s/s of anxiety. "I am feeling really good." She reports adequate sleep since anti-nausea medication has caused some drowsiness. She reports that she is now [redacted] weeks pregnant and now feeling better in terms of nausea and energy. She reports that her appetite has been improving. Has been exercising again and thinks that this has been helpful for fatigue. She reports adequate concentration overall with occasional difficulty. Denies SI.   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  Review of Systems:  Review of Systems  Constitutional:       Recent URI that has almost resolved.    Gastrointestinal: Positive for nausea and vomiting.  Neurological: Positive for headaches.   Pregnant. Due date is July 10, 2018.    Medications: I have reviewed the patient's current medications.  Current Outpatient Medications  Medication Sig Dispense Refill  . busPIRone (BUSPAR) 30 MG tablet Take 20 mg by mouth 2 (two) times daily. 2/3 po bid  0  . doxylamine, Sleep, (UNISOM) 25 MG tablet Take 25 mg by mouth at bedtime as needed.    . Prenatal Vit-Fe Sulfate-FA (PRENATAL MULTIVIT-IRON PO) Take by mouth.    . Pyridoxine HCl (VITAMIN B-6 PO) Take by mouth.    . sertraline (ZOLOFT) 100 MG tablet Take 1 tablet (100 mg total) by mouth  daily. 90 tablet 1  . BONJESTA 20-20 MG TBCR   0  . busPIRone (BUSPAR) 15 MG tablet Take 1 tablet (15 mg total) by mouth 2 (two) times daily. 180 tablet 1  . hydrOXYzine (ATARAX/VISTARIL) 25 MG tablet Take 25 mg by mouth every 6 (six) hours as needed.    . linaclotide (LINZESS) 290 MCG CAPS capsule Take by mouth.     No current facility-administered medications for this visit.     Medication Side Effects: None  Allergies: No Known Allergies  Past Medical History:  Diagnosis Date  . Allergic rhinitis   . Anxiety   . Frequent headaches   . IBS (irritable bowel syndrome)   . PCOS (polycystic ovarian syndrome)     Family History  Problem Relation Age of Onset  . Depression Mother   . Eating disorder Mother   . Arthritis Father   . Arthritis Brother   . Alcohol abuse Brother   . Mental illness Brother   . Depression Brother   . Anxiety disorder Brother   . Mental illness Maternal Grandfather   . Anxiety disorder Maternal Grandfather   . OCD Maternal Grandfather   . Heart disease Paternal Grandfather   . Diabetes Paternal Grandfather     Social History   Socioeconomic History  . Marital status: Married    Spouse name: Not on file  . Number of children: Not on file  . Years of  education: Not on file  . Highest education level: Not on file  Occupational History  . Not on file  Social Needs  . Financial resource strain: Not on file  . Food insecurity:    Worry: Not on file    Inability: Not on file  . Transportation needs:    Medical: Not on file    Non-medical: Not on file  Tobacco Use  . Smoking status: Never Smoker  . Smokeless tobacco: Never Used  Substance and Sexual Activity  . Alcohol use: Yes    Comment: 1x per week, 1 drink  . Drug use: Not on file  . Sexual activity: Yes  Lifestyle  . Physical activity:    Days per week: Not on file    Minutes per session: Not on file  . Stress: Not on file  Relationships  . Social connections:    Talks on  phone: Not on file    Gets together: Not on file    Attends religious service: Not on file    Active member of club or organization: Not on file    Attends meetings of clubs or organizations: Not on file    Relationship status: Not on file  . Intimate partner violence:    Fear of current or ex partner: Not on file    Emotionally abused: Not on file    Physically abused: Not on file    Forced sexual activity: Not on file  Other Topics Concern  . 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   Please see review of systems for further details on the patient's review from today.   Objective:   Physical Exam:  BP 117/68   Pulse 90   LMP 02/27/2017 (Exact Date)   Physical Exam  Constitutional: She is oriented to person, place, and time. She appears well-developed. No distress.  Musculoskeletal: She exhibits no deformity.  Neurological: She is alert and oriented to person, place, and time. Coordination normal.  Psychiatric: She has a normal mood and affect. Her speech is normal and behavior is normal. Judgment and thought content normal. Her mood appears not anxious. Her affect is not angry, not blunt, not labile and not inappropriate. Cognition and memory are normal. She does not exhibit a depressed mood. She expresses no homicidal and no suicidal ideation. She expresses no suicidal plans and no homicidal plans.  Insight intact. No auditory or visual hallucinations. No delusions.     Lab Review:     Component Value Date/Time   NA 138 09/20/2016 1413   K 4.3 09/20/2016 1413   CL 103 09/20/2016 1413   CO2 29 09/20/2016 1413   GLUCOSE 92 09/20/2016 1413   BUN 9 09/20/2016 1413    CREATININE 0.78 09/20/2016 1413   CALCIUM 9.6 09/20/2016 1413       Component Value Date/Time   WBC 5.7 09/20/2016 1413   RBC 4.52 09/20/2016 1413   HGB 14.1 09/20/2016 1413   HCT 41.7 09/20/2016 1413   PLT 233.0 09/20/2016 1413   MCV 92.2 09/20/2016 1413   MCHC 33.9 09/20/2016 1413   RDW 13.1 09/20/2016 1413   LYMPHSABS 1.9 09/20/2016 1413  MONOABS 0.3 09/20/2016 1413   EOSABS 0.0 09/20/2016 1413   BASOSABS 0.1 09/20/2016 1413    No results found for: POCLITH, LITHIUM   No results found for: PHENYTOIN, PHENOBARB, VALPROATE, CBMZ   .res Assessment: Plan:   Patient seen for 30 minutes and greater than 50% of visit spent counseling and coordinating care to include discussing potential benefits and risk of medications during pregnancy.  Patient reports that she has discussed current medications with her OB/GYN, and that she feels that benefits of medications are currently outweighing potential risks.  Patient reports that she would like to continue current medications.  Patient advised to contact office with any worsening signs and symptoms and she agrees to do so. Generalized anxiety disorder - Plan: sertraline (ZOLOFT) 100 MG tablet, busPIRone (BUSPAR) 15 MG tablet  Please see After Visit Summary for patient specific instructions.  Future Appointments  Date Time Provider Department Center  02/02/2018  4:00 PM Mathis Fare, LCSW [facility] None  04/24/2018  8:00 AM Corie Chiquito, PMHNP [facility] None    No orders of the defined types were placed in this encounter.     -------------------------------

## 2018-02-02 ENCOUNTER — Ambulatory Visit: Payer: PRIVATE HEALTH INSURANCE | Admitting: Psychiatry

## 2018-03-18 NOTE — L&D Delivery Note (Signed)
Delivery Note At 4:49 PM a viable female was delivered via Vaginal, Spontaneous  Presentation LOA Apgars pending Weight pending Placenta routine Cord PH not sent  Complications tight nuchal, delivered through  Anesthesia:   Episiotomy: None Lacerations: 2nd degree Suture Repair: 2.0 3.0 vicryl Est. Blood Loss (mL):  266cc   It's a girl - "Ava"! Mom to postpartum.  Baby to Couplet care / Skin to Skin.  [name] 07/03/2018, 5:20 PM

## 2018-04-22 DIAGNOSIS — B349 Viral infection, unspecified: Secondary | ICD-10-CM | POA: Diagnosis not present

## 2018-04-24 ENCOUNTER — Encounter: Payer: Self-pay | Admitting: Psychiatry

## 2018-04-24 ENCOUNTER — Ambulatory Visit: Payer: BLUE CROSS/BLUE SHIELD | Admitting: Psychiatry

## 2018-04-24 VITALS — BP 95/61 | HR 107

## 2018-04-24 DIAGNOSIS — F411 Generalized anxiety disorder: Secondary | ICD-10-CM

## 2018-04-24 MED ORDER — BUSPIRONE HCL 15 MG PO TABS: 15.0000 mg | ORAL_TABLET | Freq: Two times a day (BID) | ORAL | 1 refills | Status: DC

## 2018-04-24 MED ORDER — SERTRALINE HCL 100 MG PO TABS: 100.0000 mg | ORAL_TABLET | Freq: Every day | ORAL | 1 refills | Status: DC

## 2018-04-24 NOTE — Progress Notes (Signed)
Jennifer Vincent [id number] 06/03/1990 28 y.o.  Subjective:   Patient ID:  Jennifer Vincent is a 28 y.o. (DOB 11/02/1990) female.  Chief Complaint:  Chief Complaint  Patient presents with  . Follow-up    h/o Anxiety    HPI Jennifer Vincent presents to the office today for follow-up of anxiety. She reports that anxiety has been well controlled. She reports that she had a panic attack while traveling and that this was precipitated by exhaustion and other circumstances. She reports taking 1/2 tab of Xanax with good response. Denies any other panic attacks. Reports that she has been able to manage generalized anxiety. She reports that her mood has been "good" overall. Denies depression. She reports that she has been easily moved to tears and attributes this to pregnancy. Reports some sleep disturbance and attributes this to pregnancy. Reports falling asleep at 10 pm and awakens around 3-4 am to use the bathroom and then sleep is fragmented. She reports that her appetite has been good. Energy was low this past week with viral s/s. Energy has decreased as pregnancy progresses. Motivation has been good. Concentration has been good overall. Denies SI.  Enjoying new job. She reports that she has a good support system.   Past Psychiatric Medication Trials: Ativan Klonopin Sertraline-effective at 100 mg.  Increased anxiety at 150 mg daily BuSpar-effective  Review of Systems:  Review of Systems  HENT: Positive for congestion.   Respiratory: Positive for cough.   Gastrointestinal:       Reports that pregnancy nausea has resolved.   Musculoskeletal: Negative for gait problem.  Neurological: Negative for tremors.  Psychiatric/Behavioral:       Please refer to HPI    Medications: I have reviewed the patient's current medications.  Current Outpatient Medications  Medication Sig Dispense Refill  . Prenatal Vit-Fe Sulfate-FA (PRENATAL MULTIVIT-IRON PO) Take by mouth.    . busPIRone (BUSPAR) 15 MG tablet Take 1  tablet (15 mg total) by mouth 2 (two) times daily. 180 tablet 1  . hydrOXYzine (ATARAX/VISTARIL) 25 MG tablet Take 25 mg by mouth every 6 (six) hours as needed.    . sertraline (ZOLOFT) 100 MG tablet Take 1 tablet (100 mg total) by mouth daily. 90 tablet 1   No current facility-administered medications for this visit.     Medication Side Effects: None  Allergies: No Known Allergies  Past Medical History:  Diagnosis Date  . Allergic rhinitis   . Anxiety   . Frequent headaches   . IBS (irritable bowel syndrome)   . PCOS (polycystic ovarian syndrome)     Family History  Problem Relation Age of Onset  . Depression Mother   . Eating disorder Mother   . Arthritis Father   . Arthritis Brother   . Alcohol abuse Brother   . Mental illness Brother   . Depression Brother   . Anxiety disorder Brother   . Mental illness Maternal Grandfather   . Anxiety disorder Maternal Grandfather   . OCD Maternal Grandfather   . Heart disease Paternal Grandfather   . Diabetes Paternal Grandfather     Social History   Socioeconomic History  . Marital status: Married    Spouse name: Not on file  . Number of children: Not on file  . Years of education: Not on file  . Highest education level: Not on file  Occupational History  . Not on file  Social Needs  . Financial resource strain: Not on file  . Food insecurity:  Worry: Not on file    Inability: Not on file  . Transportation needs:    Medical: Not on file    Non-medical: Not on file  Tobacco Use  . Smoking status: Never Smoker  . Smokeless tobacco: Never Used  Substance and Sexual Activity  . Alcohol use: Yes    Comment: 1x per week, 1 drink  . Drug use: Not on file  . Sexual activity: Yes  Lifestyle  . Physical activity:    Days per week: Not on file    Minutes per session: Not on file  . Stress: Not on file  Relationships  . Social connections:    Talks on phone: Not on file    Gets together: Not on file    Attends  religious service: Not on file    Active member of club or organization: Not on file    Attends meetings of clubs or organizations: Not on file    Relationship status: Not on file  . Intimate partner violence:    Fear of current or ex partner: Not on file    Emotionally abused: Not on file    Physically abused: Not on file    Forced sexual activity: Not on file  Other Topics Concern  . 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   Please see review of systems for further details on the patient's review from today.   Objective:   Physical Exam:  BP 95/61   Pulse (!) 107   Physical Exam Constitutional:      General: She is not in acute distress.    Appearance: She is well-developed.  Musculoskeletal:        General: No deformity.  Neurological:     Mental Status: She is alert and oriented to person, place, and time.     Coordination: Coordination normal.  Psychiatric:        Attention and Perception: Attention and perception normal. She does not perceive auditory or visual hallucinations.        Mood and Affect: Mood normal. Mood is not anxious or depressed. Affect is not labile, blunt, angry or inappropriate.        Speech: Speech normal.        Behavior: Behavior normal.        Thought Content: Thought content normal. Thought content does not include homicidal or suicidal ideation. Thought content does not include homicidal or suicidal plan.        Cognition and Memory: Cognition and memory normal.        Judgment: Judgment normal.     Comments: Insight intact. No delusions.      Lab Review:     Component Value Date/Time   NA 138 09/20/2016 1413   K 4.3 09/20/2016 1413    CL 103 09/20/2016 1413   CO2 29 09/20/2016 1413   GLUCOSE 92 09/20/2016 1413   BUN 9 09/20/2016 1413   CREATININE 0.78 09/20/2016 1413   CALCIUM 9.6 09/20/2016 1413       Component Value Date/Time   WBC 5.7 09/20/2016 1413   RBC 4.52 09/20/2016 1413   HGB 14.1 09/20/2016 1413  HCT 41.7 09/20/2016 1413   PLT 233.0 09/20/2016 1413   MCV 92.2 09/20/2016 1413   MCHC 33.9 09/20/2016 1413   RDW 13.1 09/20/2016 1413   LYMPHSABS 1.9 09/20/2016 1413   MONOABS 0.3 09/20/2016 1413   EOSABS 0.0 09/20/2016 1413   BASOSABS 0.1 09/20/2016 1413    No results found for: POCLITH, LITHIUM   No results found for: PHENYTOIN, PHENOBARB, VALPROATE, CBMZ   .res Assessment: Plan:   Recommend continuing current plan of care since anxiety signs and symptoms are well controlled.  Discussed plan if patient were to develop postpartum depression and recommended that she call office and request appointment as soon as possible if this were to occur. Continue sertraline 100 mg daily for mood and anxiety. Continue BuSpar 15 mg twice daily for anxiety. Generalized anxiety disorder - Plan: sertraline (ZOLOFT) 100 MG tablet, busPIRone (BUSPAR) 15 MG tablet  Please see After Visit Summary for patient specific instructions.  Future Appointments  Date Time Provider Department Center  08/06/2018 11:00 AM Corie Chiquito, PMHNP [facility] None    No orders of the defined types were placed in this encounter.     -------------------------------

## 2018-05-04 DIAGNOSIS — Z3A30 30 weeks gestation of pregnancy: Secondary | ICD-10-CM | POA: Diagnosis not present

## 2018-05-04 DIAGNOSIS — O26843 Uterine size-date discrepancy, third trimester: Secondary | ICD-10-CM | POA: Diagnosis not present

## 2018-06-11 DIAGNOSIS — Z3688 Encounter for antenatal screening for fetal macrosomia: Secondary | ICD-10-CM | POA: Diagnosis not present

## 2018-06-11 DIAGNOSIS — Z3685 Encounter for antenatal screening for Streptococcus B: Secondary | ICD-10-CM | POA: Diagnosis not present

## 2018-06-11 DIAGNOSIS — Z3A35 35 weeks gestation of pregnancy: Secondary | ICD-10-CM | POA: Diagnosis not present

## 2018-06-11 LAB — OB RESULTS CONSOLE GBS: GBS: NEGATIVE

## 2018-06-22 ENCOUNTER — Encounter (HOSPITAL_COMMUNITY): Payer: Self-pay | Admitting: *Deleted

## 2018-06-22 ENCOUNTER — Telehealth (HOSPITAL_COMMUNITY): Payer: Self-pay | Admitting: *Deleted

## 2018-06-22 NOTE — Telephone Encounter (Signed)
Preadmission screen  

## 2018-06-30 DIAGNOSIS — O3663X0 Maternal care for excessive fetal growth, third trimester, not applicable or unspecified: Secondary | ICD-10-CM | POA: Diagnosis not present

## 2018-06-30 DIAGNOSIS — Z3A38 38 weeks gestation of pregnancy: Secondary | ICD-10-CM | POA: Diagnosis not present

## 2018-07-02 ENCOUNTER — Other Ambulatory Visit (HOSPITAL_COMMUNITY): Payer: Self-pay | Admitting: *Deleted

## 2018-07-02 NOTE — H&P (Signed)
Jennifer Vincent is a 28 y.o. female presenting for IOL s/s LGA - EFW 97%-ile. This pregnancy has been uncomplicated except for EFW 97%-ile at 38.4 wga. EFW 9#6 at that time. She was counseled on options and opted IOL at 39.0 wga. She has a hx of anxiety and Jewish ancestry.    OB History    Gravida  1   Para      Term      Preterm      AB      Living        SAB      TAB      Ectopic      Multiple      Live Births             Past Medical History:  Diagnosis Date  . Allergic rhinitis   . Anxiety   . Frequent headaches   . IBS (irritable bowel syndrome)   . PCOS (polycystic ovarian syndrome)    Past Surgical History:  Procedure Laterality Date  . TONSILLECTOMY     Family History: family history includes Alcohol abuse in her brother; Anxiety disorder in her brother and maternal grandfather; Arthritis in her brother and father; Depression in her brother and mother; Diabetes in her paternal grandfather; Eating disorder in her mother; Heart disease in her paternal grandfather; Mental illness in her brother and maternal grandfather; OCD in her maternal grandfather. Social History:  reports that she has never smoked. She has never used smokeless tobacco. She reports current alcohol use. No history on file for drug.     Maternal Diabetes: No Genetic Screening: Normal Maternal Ultrasounds/Referrals: Abnormal:  Findings:   Other: LGA - EFW 97%-ile at 38.4 wga. EFW 9#6 at that time.  Fetal Ultrasounds or other Referrals:  None Maternal Substance Abuse:  No Significant Maternal Medications:  None Significant Maternal Lab Results:  None Other Comments:  None  ROS History   Last menstrual period 10/03/2017. Exam Physical Exam  (from office) NAD, A&O NWOB Abd soft, nondistended, gravid SVE 2/50/-2  Prenatal labs: ABO, Rh: A/Positive/-- (09/18 0000) Antibody: Negative (09/18 0000) Rubella: Immune (09/18 0000) RPR: Nonreactive (09/18 0000)  HBsAg: Negative (09/18  0000)  HIV: Non-reactive (09/18 0000)  GBS:   Negative  Assessment/Plan: 28 yo G1P0 @39 .0 wga (EDD 07/10/18) presenting for IOL s/s LGA - 97%ile.  Bishop's score of 6 but given a nullip, plan for one dose of cervical ripening followed by pitocin 4 hours after the one dose of cytotec - both orders are placed. Will plan for AROM after pitocin started.  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  Expecting female. GBS negative.      Jennifer Vincent 07/02/2018, 9:59 AM

## 2018-07-03 ENCOUNTER — Inpatient Hospital Stay (HOSPITAL_COMMUNITY)
Admission: AD | Admit: 2018-07-03 | Discharge: 2018-07-05 | DRG: 807 | Disposition: A | Discharge status: Home / Self Care | Payer: BLUE CROSS/BLUE SHIELD | Attending: Obstetrics and Gynecology | Admitting: Obstetrics and Gynecology

## 2018-07-03 ENCOUNTER — Other Ambulatory Visit: Payer: Self-pay

## 2018-07-03 ENCOUNTER — Inpatient Hospital Stay (HOSPITAL_COMMUNITY): Payer: BLUE CROSS/BLUE SHIELD | Admitting: Anesthesiology

## 2018-07-03 ENCOUNTER — Inpatient Hospital Stay (HOSPITAL_COMMUNITY): Payer: BLUE CROSS/BLUE SHIELD

## 2018-07-03 ENCOUNTER — Encounter (HOSPITAL_COMMUNITY): Payer: Self-pay

## 2018-07-03 DIAGNOSIS — Z3490 Encounter for supervision of normal pregnancy, unspecified, unspecified trimester: Secondary | ICD-10-CM

## 2018-07-03 DIAGNOSIS — O3663X0 Maternal care for excessive fetal growth, third trimester, not applicable or unspecified: Secondary | ICD-10-CM | POA: Diagnosis not present

## 2018-07-03 DIAGNOSIS — Z3A39 39 weeks gestation of pregnancy: Secondary | ICD-10-CM

## 2018-07-03 DIAGNOSIS — Z23 Encounter for immunization: Secondary | ICD-10-CM | POA: Diagnosis not present

## 2018-07-03 LAB — CBC
HCT: 39.9 % (ref 36.0–46.0)
Hemoglobin: 13.1 g/dL (ref 12.0–15.0)
MCH: 30.3 pg (ref 26.0–34.0)
MCHC: 32.8 g/dL (ref 30.0–36.0)
MCV: 92.4 fL (ref 80.0–100.0)
Platelets: 209 10*3/uL (ref 150–400)
RBC: 4.32 MIL/uL (ref 3.87–5.11)
RDW: 14 % (ref 11.5–15.5)
WBC: 12.2 10*3/uL — ABNORMAL HIGH (ref 4.0–10.5)
nRBC: 0 % (ref 0.0–0.2)

## 2018-07-03 LAB — TYPE AND SCREEN
ABO/RH(D): A POS
Antibody Screen: NEGATIVE

## 2018-07-03 LAB — RPR: RPR Ser Ql: NONREACTIVE

## 2018-07-03 MED ORDER — SIMETHICONE 80 MG PO CHEW: 80.0000 mg | CHEWABLE_TABLET | ORAL | Status: DC | PRN

## 2018-07-03 MED ORDER — LIDOCAINE HCL (PF) 1 % IJ SOLN
INTRAMUSCULAR | Status: DC | PRN
  Administered 2018-07-03: 3 mL via EPIDURAL
  Administered 2018-07-03: 5 mL via EPIDURAL
  Administered 2018-07-03: 2 mL via EPIDURAL

## 2018-07-03 MED ORDER — BUTORPHANOL TARTRATE 1 MG/ML IJ SOLN: 1.0000 mg | INTRAMUSCULAR | Status: DC | PRN

## 2018-07-03 MED ORDER — LACTATED RINGERS IV SOLN
500.0000 mL | Freq: Once | INTRAVENOUS | Status: AC
  Administered 2018-07-03: 500 mL via INTRAVENOUS

## 2018-07-03 MED ORDER — FENTANYL-BUPIVACAINE-NACL 0.5-0.125-0.9 MG/250ML-% EP SOLN
12.0000 mL/h | EPIDURAL | Status: DC | PRN
  Filled 2018-07-03: qty 250

## 2018-07-03 MED ORDER — DIPHENHYDRAMINE HCL 50 MG/ML IJ SOLN: 12.5000 mg | INTRAMUSCULAR | Status: DC | PRN

## 2018-07-03 MED ORDER — OXYTOCIN 40 UNITS IN NORMAL SALINE INFUSION - SIMPLE MED
2.5000 [IU]/h | INTRAVENOUS | Status: DC
  Administered 2018-07-03: 2.5 [IU]/h via INTRAVENOUS

## 2018-07-03 MED ORDER — BUSPIRONE HCL 5 MG PO TABS
15.0000 mg | ORAL_TABLET | Freq: Two times a day (BID) | ORAL | Status: DC
  Administered 2018-07-03 – 2018-07-05 (×4): 15 mg via ORAL
  Filled 2018-07-03 (×5): qty 3

## 2018-07-03 MED ORDER — COCONUT OIL OIL: 1.0000 "application " | TOPICAL_OIL | Status: DC | PRN

## 2018-07-03 MED ORDER — OXYTOCIN 40 UNITS IN NORMAL SALINE INFUSION - SIMPLE MED
1.0000 m[IU]/min | INTRAVENOUS | Status: DC
  Administered 2018-07-03: 2 m[IU]/min via INTRAVENOUS
  Filled 2018-07-03: qty 1000

## 2018-07-03 MED ORDER — SENNOSIDES-DOCUSATE SODIUM 8.6-50 MG PO TABS
2.0000 | ORAL_TABLET | ORAL | Status: DC
  Administered 2018-07-03 – 2018-07-05 (×2): 2 via ORAL
  Filled 2018-07-03 (×2): qty 2

## 2018-07-03 MED ORDER — PHENYLEPHRINE 40 MCG/ML (10ML) SYRINGE FOR IV PUSH (FOR BLOOD PRESSURE SUPPORT)
80.0000 ug | PREFILLED_SYRINGE | INTRAVENOUS | Status: DC | PRN
  Filled 2018-07-03: qty 10

## 2018-07-03 MED ORDER — OXYTOCIN BOLUS FROM INFUSION
500.0000 mL | Freq: Once | INTRAVENOUS | Status: AC
  Administered 2018-07-03: 17:00:00 500 mL via INTRAVENOUS

## 2018-07-03 MED ORDER — EPHEDRINE 5 MG/ML INJ: 10.0000 mg | INTRAVENOUS | Status: DC | PRN

## 2018-07-03 MED ORDER — IBUPROFEN 600 MG PO TABS
600.0000 mg | ORAL_TABLET | Freq: Four times a day (QID) | ORAL | Status: DC
  Administered 2018-07-03 – 2018-07-05 (×6): 600 mg via ORAL
  Filled 2018-07-03 (×6): qty 1

## 2018-07-03 MED ORDER — ONDANSETRON HCL 4 MG/2ML IJ SOLN: 4.0000 mg | Freq: Four times a day (QID) | INTRAMUSCULAR | Status: DC | PRN

## 2018-07-03 MED ORDER — TETANUS-DIPHTH-ACELL PERTUSSIS 5-2.5-18.5 LF-MCG/0.5 IM SUSP: 0.5000 mL | Freq: Once | INTRAMUSCULAR | Status: DC

## 2018-07-03 MED ORDER — ONDANSETRON HCL 4 MG PO TABS: 4.0000 mg | ORAL_TABLET | ORAL | Status: DC | PRN

## 2018-07-03 MED ORDER — LACTATED RINGERS IV SOLN: 500.0000 mL | INTRAVENOUS | Status: DC | PRN

## 2018-07-03 MED ORDER — ONDANSETRON HCL 4 MG/2ML IJ SOLN: 4.0000 mg | INTRAMUSCULAR | Status: DC | PRN

## 2018-07-03 MED ORDER — BENZOCAINE-MENTHOL 20-0.5 % EX AERO
1.0000 "application " | INHALATION_SPRAY | CUTANEOUS | Status: DC | PRN
  Filled 2018-07-03: qty 56

## 2018-07-03 MED ORDER — SODIUM CHLORIDE (PF) 0.9 % IJ SOLN
INTRAMUSCULAR | Status: DC | PRN
  Administered 2018-07-03: 12 mL/h via EPIDURAL

## 2018-07-03 MED ORDER — LIDOCAINE HCL (PF) 1 % IJ SOLN
30.0000 mL | INTRAMUSCULAR | Status: DC | PRN
  Filled 2018-07-03: qty 30

## 2018-07-03 MED ORDER — PHENYLEPHRINE 40 MCG/ML (10ML) SYRINGE FOR IV PUSH (FOR BLOOD PRESSURE SUPPORT): 80.0000 ug | PREFILLED_SYRINGE | INTRAVENOUS | Status: DC | PRN

## 2018-07-03 MED ORDER — OXYCODONE-ACETAMINOPHEN 5-325 MG PO TABS: 1.0000 | ORAL_TABLET | ORAL | Status: DC | PRN

## 2018-07-03 MED ORDER — TERBUTALINE SULFATE 1 MG/ML IJ SOLN: 0.2500 mg | Freq: Once | INTRAMUSCULAR | Status: DC | PRN

## 2018-07-03 MED ORDER — DIPHENHYDRAMINE HCL 25 MG PO CAPS: 25.0000 mg | ORAL_CAPSULE | Freq: Four times a day (QID) | ORAL | Status: DC | PRN

## 2018-07-03 MED ORDER — PRENATAL MULTIVITAMIN CH
1.0000 | ORAL_TABLET | Freq: Every day | ORAL | Status: DC
  Administered 2018-07-04: 13:00:00 1 via ORAL
  Filled 2018-07-03: qty 1

## 2018-07-03 MED ORDER — SOD CITRATE-CITRIC ACID 500-334 MG/5ML PO SOLN: 30.0000 mL | ORAL | Status: DC | PRN

## 2018-07-03 MED ORDER — ACETAMINOPHEN 325 MG PO TABS: 650.0000 mg | ORAL_TABLET | ORAL | Status: DC | PRN

## 2018-07-03 MED ORDER — DIBUCAINE (PERIANAL) 1 % EX OINT
1.0000 "application " | TOPICAL_OINTMENT | CUTANEOUS | Status: DC | PRN
  Administered 2018-07-04: 1 via RECTAL
  Filled 2018-07-03: qty 28

## 2018-07-03 MED ORDER — SERTRALINE HCL 100 MG PO TABS
100.0000 mg | ORAL_TABLET | Freq: Every day | ORAL | Status: DC
  Administered 2018-07-03 – 2018-07-04 (×2): 100 mg via ORAL
  Filled 2018-07-03 (×3): qty 1

## 2018-07-03 MED ORDER — OXYCODONE HCL 5 MG PO TABS
5.0000 mg | ORAL_TABLET | ORAL | Status: DC | PRN
  Administered 2018-07-04: 5 mg via ORAL
  Filled 2018-07-03: qty 1

## 2018-07-03 MED ORDER — ZOLPIDEM TARTRATE 5 MG PO TABS: 5.0000 mg | ORAL_TABLET | Freq: Every evening | ORAL | Status: DC | PRN

## 2018-07-03 MED ORDER — MISOPROSTOL 25 MCG QUARTER TABLET
25.0000 ug | ORAL_TABLET | Freq: Once | ORAL | Status: AC
  Administered 2018-07-03: 25 ug via VAGINAL
  Filled 2018-07-03: qty 1

## 2018-07-03 MED ORDER — ZOLPIDEM TARTRATE 5 MG PO TABS
5.0000 mg | ORAL_TABLET | Freq: Once | ORAL | Status: AC
  Administered 2018-07-03: 5 mg via ORAL
  Filled 2018-07-03: qty 1

## 2018-07-03 MED ORDER — LACTATED RINGERS IV SOLN
INTRAVENOUS | Status: DC
  Administered 2018-07-03 (×2): via INTRAVENOUS

## 2018-07-03 MED ORDER — OXYCODONE HCL 5 MG PO TABS: 10.0000 mg | ORAL_TABLET | ORAL | Status: DC | PRN

## 2018-07-03 MED ORDER — OXYCODONE-ACETAMINOPHEN 5-325 MG PO TABS: 2.0000 | ORAL_TABLET | ORAL | Status: DC | PRN

## 2018-07-03 MED ORDER — WITCH HAZEL-GLYCERIN EX PADS: 1.0000 "application " | MEDICATED_PAD | CUTANEOUS | Status: DC | PRN

## 2018-07-03 NOTE — Progress Notes (Signed)
Patient has been pushing for almost two hours. Position is now LOA - almost DOA. Caput forming but she has pushing from 0 to +1. Thus, she has made good progress albeit slow.  She would like to continue pushing.

## 2018-07-03 NOTE — Progress Notes (Signed)
9/c/0, just a little LOA, almost LOT but not quite Position change to help head come down   Rosie Fate MD

## 2018-07-03 NOTE — Anesthesia Procedure Notes (Signed)
Epidural Patient location during procedure: OB Start time: 07/03/2018 8:26 AM End time: 07/03/2018 8:33 AM  Staffing Anesthesiologist: Marcene Duos, MD Performed: anesthesiologist   Preanesthetic Checklist Completed: patient identified, site marked, surgical consent, pre-op evaluation, timeout performed, IV checked, risks and benefits discussed and monitors and equipment checked  Epidural Patient position: sitting Prep: site prepped and draped and DuraPrep Patient monitoring: continuous pulse ox and blood pressure Approach: midline Location: L4-L5 Injection technique: LOR air  Needle:  Needle type: Tuohy  Needle gauge: 17 G Needle length: 9 cm and 9 Needle insertion depth: 6 cm Catheter type: closed end flexible Catheter size: 19 Gauge Catheter at skin depth: 11 cm Test dose: negative  Assessment Events: blood not aspirated, injection not painful, no injection resistance, negative IV test and no paresthesia

## 2018-07-03 NOTE — Anesthesia Preprocedure Evaluation (Signed)
Anesthesia Evaluation  Patient identified by MRN, date of birth, ID band Patient awake    Reviewed: Allergy & Precautions, Patient's Chart, lab work & pertinent test results  Airway Mallampati: II  TM Distance: >3 FB     Dental   Pulmonary    Pulmonary exam normal        Cardiovascular negative cardio ROS Normal cardiovascular exam     Neuro/Psych  Headaches,    GI/Hepatic negative GI ROS, Neg liver ROS,   Endo/Other  negative endocrine ROS  Renal/GU negative Renal ROS     Musculoskeletal   Abdominal   Peds  Hematology negative hematology ROS (+)   Anesthesia Other Findings   Reproductive/Obstetrics (+) Pregnancy                             Anesthesia Physical Anesthesia Plan  ASA: II  Anesthesia Plan: Epidural   Post-op Pain Management:    Induction:   PONV Risk Score and Plan: Treatment may vary due to age or medical condition  Airway Management Planned: Natural Airway  Additional Equipment:   Intra-op Plan:   Post-operative Plan:   Informed Consent: I have reviewed the patients History and Physical, chart, labs and discussed the procedure including the risks, benefits and alternatives for the proposed anesthesia with the patient or authorized representative who has indicated his/her understanding and acceptance.       Plan Discussed with:   Anesthesia Plan Comments:         Anesthesia Quick Evaluation

## 2018-07-03 NOTE — Progress Notes (Signed)
No changes to H&P.  SVE 6/60/-3, head well applied to cervix, AROM for clear fluid, OP EFW 9#10 by leopolds (4360g). Last [place] 9#6 in office ~ one week ago.  Possibility of shoulder dystocia d/w pt in detail.  Continue pitocin per protocol. Wants CLE.  Expecting female.  GBS negative.    Rosie Fate MD

## 2018-07-03 NOTE — Lactation Note (Signed)
This note was copied from a baby's chart. Lactation Consultation Note  Patient Name: Jennifer Vincent LSLHT'D Date: 07/03/2018 Reason for consult: Initial assessment;1st time breastfeeding;Term P1, 4 hour female infant, LGA greater than 9 lbs. Per mom, she is having a lot of breast pain due infant not latching well in L&D ( breastfeeding  hurt and was painful). Infant breastfeed 5 minutes on each breast. LC notice mom has very flat nipples with abrasions on both breast due poor latch in L &D. Mom attended a BF class at [facility] in her pregnancy and has Medela DEBP at home. Mom was taught hand expression and infant was given 5 ml of colostrum by spoon. LC discussed identifying hunger cues and breastfeeding infant 8 or more times within 24 hours. LC ask mom due breast stimulation and hand express prior to latching infant to breast. After few attempts of latching infant to breast, Mom was fitted with 24 mm NS and infant latched without difficulty, wide mouth and nose to breast. Infant breastfeed for 21 minutes and was still feeding when LC left room.  Mom was given comfort gels and explained how to use. Mom was given breast shells and knows to wear them during the day in bra  and not at night  Mom will try to pump every 3 hours for 15 minutes on initial setting. LC discussed I & O. Mom knows to call Nurse or LC if she has any further questions, concerns or need assistance with latching infant to breast. Mom made aware of O/P services, breastfeeding support groups, community resources, and our phone # for post-discharge questions.  Mom's plans: 1. Mom will breastfeed according hunger cues, 8 or more times within 24 hours using 24 mm NS. 2. Mom will wear breast shells during day in bra. 3. Mom will try pump after breastfeeding with DEBP and will give infant back any EBM. 4. Mom will call for assistance if she needs help with latching infant to breast.   Maternal Data Formula Feeding for  Exclusion: No Has patient been taught Hand Expression?: Yes(74ml given by spoon) Does the patient have breastfeeding experience prior to this delivery?: No  Feeding Feeding Type: Breast Fed  LATCH Score Latch: Repeated attempts needed to sustain latch, nipple held in mouth throughout feeding, stimulation needed to elicit sucking reflex.  Audible Swallowing: A few with stimulation  Type of Nipple: Flat  Comfort (Breast/Nipple): Soft / non-tender  Hold (Positioning): Assistance needed to correctly position infant at breast and maintain latch.  LATCH Score: 6  Interventions Interventions: Breast feeding basics reviewed;Assisted with latch;Skin to skin;Breast massage;Hand express;Support pillows;Comfort gels;Adjust position;Shells;Breast compression;Expressed milk;DEBP;Position options;Pre-pump if needed;Hand pump  Lactation Tools Discussed/Used Tools: Shells;Pump;Nipple Shields Nipple shield size: 24 Breast pump type: Double-Electric Breast Pump WIC Program: No Pump Review: Setup, frequency, and cleaning;Milk Storage Initiated by:: Danelle Earthly, IBCLC Date initiated:: 07/03/18   Consult Status Consult Status: Follow-up Date: 07/04/18 Follow-up type: In-patient    Danelle Earthly 07/03/2018, 9:18 PM

## 2018-07-04 LAB — CBC
HCT: 36.6 % (ref 36.0–46.0)
Hemoglobin: 12.1 g/dL (ref 12.0–15.0)
MCH: 30.7 pg (ref 26.0–34.0)
MCHC: 33.1 g/dL (ref 30.0–36.0)
MCV: 92.9 fL (ref 80.0–100.0)
Platelets: 182 10*3/uL (ref 150–400)
RBC: 3.94 MIL/uL (ref 3.87–5.11)
RDW: 14.1 % (ref 11.5–15.5)
WBC: 21.1 10*3/uL — ABNORMAL HIGH (ref 4.0–10.5)
nRBC: 0 % (ref 0.0–0.2)

## 2018-07-04 LAB — ABO/RH: ABO/RH(D): A POS

## 2018-07-04 MED ORDER — DOCUSATE SODIUM 100 MG PO CAPS: 100.0000 mg | ORAL_CAPSULE | Freq: Two times a day (BID) | ORAL | 2 refills | Status: DC

## 2018-07-04 MED ORDER — IBUPROFEN 600 MG PO TABS: 600.0000 mg | ORAL_TABLET | Freq: Four times a day (QID) | ORAL | 0 refills | Status: DC

## 2018-07-04 MED ORDER — OXYCODONE HCL 5 MG PO TABS: 5.0000 mg | ORAL_TABLET | ORAL | 0 refills | Status: DC | PRN

## 2018-07-04 NOTE — Lactation Note (Signed)
This note was copied from a baby's chart. Lactation Consultation Note  Patient Name: Girl Phinley Schrack [id number] Date: 07/04/2018 Reason for consult: Follow-up assessment;1st time breastfeeding;Primapara;Term;Nipple pain/trauma;Difficult latch  Visited with P1 Mom of term baby at 45 hrs old.  Mom has history of anxiety and is feeling very anxious right now.  Mom started crying.  Reassured Mom and gave her some TLC.   Baby cueing and alert.  Mom states baby had just fed on right breast for 20 mins.  Talked about normal newborn feeding behavior, and how baby will becoming more hungry.   Baby had a large void and transitional stool prior to assist with breastfeeding.  Offered to assist/assess with latch.  Mom has very sore, bruised and blistered nipples from earlier shallow latches.  Nipple flat, with very little eversion with pumping.  Mom had been using a 20 mm nipple shield.  Watched FOB apply this very well.  Nipple looks pinched in shield.  Baby unable to attain a deep, consistent latch.  Mom tends to want to hold shield in place.  Assisted Mom to move her hands back to base of breast.  Baby tends to push nipple out of her mouth.   Oral exam- labial frenulum to gum ridge and posterior lingual frenulum noted.  Baby's tongue elevated and extends.  Palate normal arched.  Switched to 24 mm nipple shield following hand pumping to prime the breast.  Mom assisted to latch baby to left breast in football hold.  Baby able to attain a deep latch with deep jaw extensions.  Baby was consistent for 25 mins.  Colostrum noted all over baby's mouth.  Swallowing identified.   Reviewed pumping and disassembling and washing of pump parts, rinsing, and air drying.    Comfort Gels given to Mom, and explained how to care for them.  Plan-  1- Keep baby STS as much as possible 2- Offer breast with feeding cue, pre-pump using hand pump and 24 mm nipple shield to assist with latch.  Mom to support breast firmly during  feeding. 3- After every other breastfeeding, pump both breasts on initiation setting.  Save any EBM to feed back to baby.  4- ask for help prn.    Interventions Interventions: Breast feeding basics reviewed;Assisted with latch;Skin to skin;Breast massage;Hand express;Pre-pump if needed;Breast compression;Adjust position;Support pillows;Position options;Expressed milk;Shells;Comfort gels;Hand pump;DEBP  Lactation Tools Discussed/Used Tools: Shells;Pump;Nipple Shields;Comfort gels Nipple shield size: 24 Shell Type: Inverted Breast pump type: Double-Electric Breast Pump;Manual   Consult Status Consult Status: Follow-up Date: 07/05/18 Follow-up type: In-patient    Judee Clara 07/04/2018, 2:25 PM

## 2018-07-04 NOTE — Progress Notes (Signed)
MOB was referred for history of depression/anxiety. * Referral screened out by Clinical Social Worker because none of the following criteria appear to apply: ~ History of anxiety/depression during this pregnancy, or of post-partum depression following prior delivery. ~ Diagnosis of anxiety and/or depression within last 3 years OR * MOB's symptoms currently being treated with medication and/or therapy. MOB has an active Rx for Zoloft and Buspar.  No concerns were noted in OB record.   Please contact the Clinical Social Worker if needs arise, by [facility] request, or if MOB scores greater than 9/yes to question 10 on Edinburgh Postpartum Depression Screen.  Blaine Hamper, MSW, LCSW Clinical Social Work [phone]

## 2018-07-04 NOTE — Progress Notes (Signed)
Post Partum Day 1 Subjective: no complaints, up ad lib, voiding and tolerating PO  Objective: Blood pressure 117/81, pulse (!) 105, temperature 97.8 F (36.6 C), temperature source Oral, resp. rate 19, height 5\' 8"  (1.727 m), weight 104.3 kg, last menstrual period 10/03/2017, SpO2 98 %, unknown if currently breastfeeding.  Physical Exam:  General: alert, cooperative and appears stated age Lochia: appropriate Uterine Fundus: firm Incision: healing well, no significant drainage, no dehiscence, no significant erythema DVT Evaluation: No evidence of DVT seen on physical exam. Negative Homan's sign. No cords or calf tenderness. No significant calf/ankle edema.  Recent Labs    07/03/18 0020 07/04/18 0451  HGB 13.1 12.1  HCT 39.9 36.6    Assessment/Plan: Plan for discharge tomorrow.    LOS: 1 day   [name] 07/04/2018, 9:24 PM

## 2018-07-04 NOTE — Discharge Summary (Signed)
Obstetric Discharge Summary Reason for Admission: induction of labor Prenatal Procedures: none Intrapartum Procedures: spontaneous vaginal delivery Postpartum Procedures: none Complications-Operative and Postpartum: 2nd degree perineal laceration Hemoglobin  Date Value Ref Range Status  07/04/2018 12.1 12.0 - 15.0 g/dL Final   HCT  Date Value Ref Range Status  07/04/2018 36.6 36.0 - 46.0 % Final    Physical Exam:  General: alert, cooperative and appears stated age Lochia: appropriate Uterine Fundus: firm Incision: healing well, no significant drainage, no dehiscence DVT Evaluation: No evidence of DVT seen on physical exam. Negative Homan's sign. No cords or calf tenderness. No significant calf/ankle edema.  Discharge Diagnoses: Term Pregnancy-delivered and LGA  Discharge Information: Date: 07/04/2018 Activity: pelvic rest Diet: routine Medications: PNV, Ibuprofen, Colace and oxycodone Condition: stable Instructions: refer to practice specific booklet Discharge to: home   Newborn Data: Live born female  Birth Weight: 9 lb 4.5 oz (4210 g) APGAR: 8, 9  Newborn Delivery   Birth date/time:  07/03/2019 16:49:00 Delivery type:  Vaginal, Spontaneous     Home with mother.  [name] 07/04/2018, 7:51 AM

## 2018-07-04 NOTE — Anesthesia Postprocedure Evaluation (Signed)
Anesthesia Post Note  Patient: Jennifer Vincent  Procedure(s) Performed: AN AD HOC LABOR EPIDURAL     Patient location during evaluation: Mother Baby Anesthesia Type: Epidural Level of consciousness: awake Pain management: satisfactory to patient Vital Signs Assessment: post-procedure vital signs reviewed and stable Respiratory status: spontaneous breathing Cardiovascular status: stable Anesthetic complications: no    Last Vitals:  Vitals:   07/04/18 0500 07/04/18 0734  BP: 92/71 117/81  Pulse: (!) 113 (!) 105  Resp: 19   Temp: 36.6 C   SpO2:  98%    Last Pain:  Vitals:   07/04/18 0734  TempSrc:   PainSc: 6    Pain Goal:                Epidural/Spinal Function Cutaneous sensation: Normal sensation (07/04/18 0734), Patient able to flex knees: Yes (07/04/18 0734), Patient able to lift hips off bed: Yes (07/04/18 0734), Back pain beyond tenderness at insertion site: No (07/04/18 0734), Progressively worsening motor and/or sensory loss: No (07/04/18 0734), Bowel and/or bladder incontinence post epidural: No (07/04/18 0734)  Cephus Shelling

## 2018-07-05 ENCOUNTER — Ambulatory Visit: Payer: Self-pay

## 2018-07-05 NOTE — Lactation Note (Signed)
This note was copied from a baby's chart. Lactation Consultation Note:  Mother ready for discharge. She reports that she thinks that breastfeeding is going better.  Mother denies having pain or soreness when infant feeding.   Mother was given harmony hand pump with instructions.   Encouraged mother to continue to cue base feed and breastfeed 8-12 time or more in 24 hours. Discussed cluster feeding up to the 5th night of life. Mother reports having an electric pump at home.   Discussed treatment and prevention of engorgement.  Mother receptive to all teaching.  Advised to follow up with [facility] for breastfeeding questions or concerns. Patient hand phone number for lc office.   Patient Name: Jennifer Vincent [id number]'N Date: 07/05/2018 Reason for consult: Follow-up assessment   Maternal Data    Feeding    LATCH Score                   Interventions Interventions: Hand pump  Lactation Tools Discussed/Used     Consult Status Consult Status: Complete    Jennifer Vincent 07/05/2018, 3:07 PM

## 2018-07-05 NOTE — Discharge Summary (Signed)
Obstetric Discharge Summary Please see prior dcs. No changes. Patient re-examined on day of discharge and no changes.    Idelia Salm MD

## 2018-07-16 ENCOUNTER — Ambulatory Visit: Payer: Self-pay

## 2018-07-16 DIAGNOSIS — R633 Feeding difficulties: Secondary | ICD-10-CM | POA: Diagnosis not present

## 2018-07-16 NOTE — Lactation Note (Addendum)
This note was copied from a baby's chart. Lactation Consultation Note  Patient Name: Jennifer Vincent Orbach GNFAO'ZToday's Date: 07/16/2018   07/16/2018  Name: Jennifer Vincent Darnold MRN: [id number] Date of Birth: 07/03/2019 Gestational Age: Gestational Age: [id number] Birth Weight: 148.5 oz Weight today:    10 pounds 3.3 ounces (4628 grams) with clean size 1 diaper  13 day old term infant presents today with mom for feeding assessment. Mom has been feeling overwhelmed as infant comes on and off the breast and will sometimes become frantic with feeding. Infant tends to cluster feed for hours at a time, most likely she is not transferring well with her latches and needing frequent feedings.   Infant has gained 602 grams in the last 11 days with an average daily weight gain of 55 grams a day. Infant is well above birth weight.   Mom was using the NS in the hospital and for 2 days once home. Mom says they threw them away as they did not stay on well. This is most likely due to flat nipples that do not evert well.  Worked on application and stimulating nipple prior to applying.   Infant is self awakening for some feeds and parents are awakening at 3 hours if she is not stirring. Enc mom to allow infant to demand feed since she has such good growth.   Mom has been pumping and bottle feeding infant recently as mom has been feeling overwhelmed with feeding. Infant tends to pull on and off the breast. Mom was feeling very anxious. Reviewed hands free bra and hands on pumping to help empty the breast. Reviewed relaxation with pumping.   Infant has been supplemented with formula in the beginning for jaundice. Mom says occasionally she needs to use formula as she is out of breast milk. Enc her to do so as needed.   Infant latched to the right breast in the cross cradle hold. Infant would attempt to latch and pull on and off the breast. # 24 NS applied and infant relatched and fed for about 20 minutes and transferred 40 ml. Mom  reports this is the best feeding she has had recently.   Flat nipples seem to be an issue with infant sustaining latch. Will reassess infant oral structure at next assessment. Gave mom breast shells with instructions on use and cleaning. To wear during the day only.   With difficulty of feeding and latching, enc mom to pump and bottle feed at night and to attempt BF 3-4 x a day and to pump afterwards to supplement infant as needed.   Infant to follow up with Dr. Vonna KotykeClaire tomorrow. Infant to follow up with Lactation in 2 weeks and to call with questions/concerns as needed. Mom informed of Virtual BF Support Groups and how to sign up. [facility] did call and speak with mom after discharge.   Mom feels like she is resting and napping during the day and eating and drinking well. Dad is working from home and very supportive of mom.   Praised mom for her efforts in feeding her child. Mom says she feels better after meeting today. Enc mom to take one feeding at a time and to adjust feedings as needed with goal of making sure infant is fed and milk supply is protected while continuing to practice and learn BF. Mom voiced understanding.     General Information: Mother's reason for visit: Feeding assessment, difficulty maintaining latch Consult: Initial Lactation consultant: Noralee StainSharon Jeanpaul Biehl RN,IBCLC Breastfeeding experience: has  been pumping and bottle feeding as infant not BF well. Maternal medical conditions: Polycystic ovarian syndrome Maternal medications: Pre-natal vitamin(Zoloft, Buspar)  Breastfeeding History: Frequency of breast feeding: has been pumping and bottle feeding Duration of feeding: 20 minutes  Supplementation: Supplement method: bottle(Medela slow flow nipple)         Breast milk volume: 3 ounces Breast milk frequency: every 3 hours   Pump type: Medela pump in style Pump frequency: every 3 hours Pump volume: 4-5 ounces  Infant Output Assessment: Voids per 24 hours:  8+ Urine color: Clear yellow Stools per 24 hours: 5+ Stool color: Yellow  Breast Assessment: Breast: Soft, Compressible Nipple: Flat, Reddened(have healed per mom) Pain level: 2 Pain interventions: Bra, Breast pump, Expressed breast milk, Coconut oil, Nipple shield  Feeding Assessment: Infant oral assessment: Variance Infant oral assessment comment: Infant with recessed chin and high palate. Infant with thin labial frenulum noted, upper lip with some tightness with flanging. Infant with some decreased mid tongue elevation. Infant with good tongue extension and lateralization. Infant has difficulty maintaining latch on the breast, mom's nipple are flat. mom with no pain with feeding.  Will reasess at next feeding.  Positioning: Cross cradle(right breast, 20 minutes) Latch: 1 - Repeated attempts needed to sustain latch, nipple held in mouth throughout feeding, stimulation needed to elicit sucking reflex. Audible swallowing: 2 - Spontaneous and intermittent Type of nipple: 1 - Flat Comfort: 2 - Soft/non-tender Hold: 2 - No assistance needed to correctly position infant at breast LATCH score: 8 Latch assessment: Deep Lips flanged: Yes Suck assessment: Displays both Tools: Nipple shield 24 mm Pre-feed weight: 4628 grams Post feed weight: 4668 grams Amount transferred: 40 ml Amount supplemented: supplemented prior to appt  Additional Feeding Assessment:                                    Totals: Total amount transferred: 40 ml Total supplement given: did not supplement after feedings Total amount pumped post feed: did not pump   Plan: 1. Offer infant the breast with feeding cues, limit feedings to 30 minutes if infant is sleepy. Try to put infant to breast about 3-4 x a day.  2. Keep infant awake with feedings as needed 3. Massage/compress breast with feeding  4. Empty the first breast before offering the second breast 5. If infant frantic at the breast offer 1/2  ounce to 1 ounce in the bottle and then offer the breast 6. Use the Nipple Shield to start the feeding if needed and then take it off and latch her to the breast 7. Can use your manual pump prior to latch to help pull out nipple prior to feeding 8. Can use the breast shells between feedings during the day to assist with elongate nipple prior to latch 9. Offer infant a bottle of pumped milk if she will not latch to the breast 10. Infant needs about 86-115 ml (3-4 ounces) for 8 feedings a day or 690-920 ml (23-31 ounces) in 24 hours. Infant may take more or less depending on how often she feeds. Feed her until she is satisfied.  11. Feed infant using a slow flow nipple 12. Feed infant using the paced bottle feeding method (video on [email or web address].[email or web address]) 13. Would recommend that you pump at least 4-8 x a day for 15-20 minutes with your double electric breast pump or anytime infant is not latching to the breast for a  feeding 14. Keep up the good work 15. Thank you for allowing me to assist you today 16. Please call with any questions/concerns as needed [phone] 17. Follow up with Lactation in 2 weeks   Ed Blalock RN, IBCLC                                                       Ed Blalock 07/16/2018, 8:37 AM

## 2018-08-06 ENCOUNTER — Other Ambulatory Visit: Payer: Self-pay

## 2018-08-06 ENCOUNTER — Encounter: Payer: Self-pay | Admitting: Psychiatry

## 2018-08-06 ENCOUNTER — Ambulatory Visit (INDEPENDENT_AMBULATORY_CARE_PROVIDER_SITE_OTHER): Payer: BLUE CROSS/BLUE SHIELD | Admitting: Psychiatry

## 2018-08-06 DIAGNOSIS — F411 Generalized anxiety disorder: Secondary | ICD-10-CM | POA: Diagnosis not present

## 2018-08-06 NOTE — Progress Notes (Signed)
Jennifer Vincent [id number] 10/18/1990 28 y.o.  Virtual Visit via Telephone Note  I connected with pt on 08/06/18 at 11:00 AM EDT by telephone and verified that I am speaking with the correct person using two identifiers.   I discussed the limitations, risks, security and privacy concerns of performing an evaluation and management service by telephone and the availability of in person appointments. I also discussed with the patient that there may be a patient responsible charge related to this service. The patient expressed understanding and agreed to proceed.   I discussed the assessment and treatment plan with the patient. The patient was provided an opportunity to ask questions and all were answered. The patient agreed with the plan and demonstrated an understanding of the instructions.   The patient was advised to call back or seek an in-person evaluation if the symptoms worsen or if the condition fails to improve as anticipated.  I provided 30 minutes of non-face-to-face time during this encounter.  The patient was located at home.  The provider was located at home.   Corie Chiquito, PMHNP   Subjective:   Patient ID:  Jennifer Vincent is a 28 y.o. (DOB 01/21/1991) female.  Chief Complaint:  Chief Complaint  Patient presents with  . Follow-up    Anxiety    HPI Jennifer Vincent presents for follow-up of anxiety and depression. Baby, Ava, will be 72 weeks old tomorrow. Pt reports that the first 2 weeks were "difficult" with sleep deprivation and some increased anxiety. Reports that she had some challenges with breastfeeding and anxiety in response to this. Reports that she had a severe panic attack about a week after giving birth with a "perfect storm" of triggers. She reports that she took 1/2 tab of Xanax and gave her baby formula afterwards. Reports that anxiety is now manageable. Reports that she has been bonding with her baby since birth. She reports that baby is doing well and sleeping better and  pt reports that she is now sleeping better. She reports that she is sleeping an adequate amount. She reports that there are moments where she feels sad about the pandemic interfering with how she had hoped the birth of their child with not being able to share this time with family due to preventing risk of virus. Denies persistent sad mood. Reports that she was having crying episodes in the first 2-3 weeks after child birth when having breastfeeding issues. Reports crying yesterday with husband returning to work today. She reports that her appetite has been increased with breastfeeding. She reports that her energy and motivation have been ok now that she is getting adequate sleep. Has been trying to walk when weather permits. Discussed adequate concentration is adequate. Denies SI.   Reports that her husband has been very supportive.   Reports that she mostly pumping and then feeding a bottle.   Past Psychiatric Medication Trials: Ativan Xanax Klonopin Sertraline-effective at 100 mg.  Increased anxiety at 150 mg daily BuSpar-effective  Review of Systems:  Review of Systems  Genitourinary: Positive for vaginal bleeding.  Musculoskeletal: Negative for gait problem.  Neurological: Negative for tremors.  Psychiatric/Behavioral:       Please refer to HPI    Medications: I have reviewed the patient's current medications.  Current Outpatient Medications  Medication Sig Dispense Refill  . ALPRAZolam (XANAX) 0.5 MG tablet Take 0.5 mg by mouth daily as needed for anxiety.    . Prenatal Vit-Fe Sulfate-FA (PRENATAL MULTIVIT-IRON PO) Take by mouth.    Marland Kitchen  busPIRone (BUSPAR) 15 MG tablet Take 1 tablet (15 mg total) by mouth 2 (two) times daily. 180 tablet 1  . ibuprofen (ADVIL) 600 MG tablet Take 1 tablet (600 mg total) by mouth every 6 (six) hours. (Patient not taking: Reported on 08/06/2018) 30 tablet 0  . sertraline (ZOLOFT) 100 MG tablet Take 1 tablet (100 mg total) by mouth daily. 90 tablet 1    No current facility-administered medications for this visit.     Medication Side Effects: None  Allergies: No Known Allergies  Past Medical History:  Diagnosis Date  . Allergic rhinitis   . Anxiety   . Frequent headaches   . IBS (irritable bowel syndrome)   . PCOS (polycystic ovarian syndrome)     Family History  Problem Relation Age of Onset  . Depression Mother   . Eating disorder Mother   . Arthritis Father   . Arthritis Brother   . Alcohol abuse Brother   . Mental illness Brother   . Depression Brother   . Anxiety disorder Brother   . Mental illness Maternal Grandfather   . Anxiety disorder Maternal Grandfather   . OCD Maternal Grandfather   . Heart disease Paternal Grandfather   . Diabetes Paternal Grandfather     Social History   Socioeconomic History  . Marital status: Married    Spouse name: Not on file  . Number of children: Not on file  . Years of education: Not on file  . Highest education level: Not on file  Occupational History  . Not on file  Social Needs  . Financial resource strain: Not on file  . Food insecurity:    Worry: Not on file    Inability: Not on file  . Transportation needs:    Medical: Not on file    Non-medical: Not on file  Tobacco Use  . Smoking status: Never Smoker  . Smokeless tobacco: Never Used  Substance and Sexual Activity  . Alcohol use: Yes    Comment: 1x per week, 1 drink  . Drug use: Never  . Sexual activity: Yes  Lifestyle  . Physical activity:    Days per week: Not on file    Minutes per session: Not on file  . Stress: Not on file  Relationships  . Social connections:    Talks on phone: Not on file    Gets together: Not on file    Attends religious service: Not on file    Active member of club or organization: Not on file    Attends meetings of clubs or organizations: Not on file    Relationship status: Not on file  . Intimate partner violence:    Fear of current or ex partner: Not on file     Emotionally abused: Not on file    Physically abused: Not on file    Forced sexual activity: Not on file  Other Topics Concern  . 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   Please see review of systems for further details on the patient's review from today.   Objective:   Physical Exam:  LMP 10/03/2017   Physical Exam Neurological:  Mental Status: She is alert and oriented to person, place, and time.     Cranial Nerves: No dysarthria.  Psychiatric:        Attention and Perception: Attention normal.        Speech: Speech normal.        Behavior: Behavior is cooperative.        Thought Content: Thought content normal. Thought content is not paranoid or delusional. Thought content does not include homicidal or suicidal ideation. Thought content does not include homicidal or suicidal plan.        Cognition and Memory: Cognition and memory normal.        Judgment: Judgment normal.     Comments: Mood is appropriate to content     Lab Review:     Component Value Date/Time   NA 138 09/20/2016 1413   K 4.3 09/20/2016 1413   CL 103 09/20/2016 1413   CO2 29 09/20/2016 1413   GLUCOSE 92 09/20/2016 1413   BUN 9 09/20/2016 1413   CREATININE 0.78 09/20/2016 1413   CALCIUM 9.6 09/20/2016 1413       Component Value Date/Time   WBC 21.1 (H) 07/04/2018 0451   RBC 3.94 07/04/2018 0451   HGB 12.1 07/04/2018 0451   HCT 36.6 07/04/2018 0451   PLT 182 07/04/2018 0451   MCV 92.9 07/04/2018 0451   MCH 30.7 07/04/2018 0451   MCHC 33.1 07/04/2018 0451   RDW 14.1 07/04/2018 0451   LYMPHSABS 1.9 09/20/2016 1413   MONOABS 0.3 09/20/2016 1413   EOSABS 0.0 09/20/2016 1413   BASOSABS 0.1  09/20/2016 1413    No results found for: POCLITH, LITHIUM   No results found for: PHENYTOIN, PHENOBARB, VALPROATE, CBMZ   .res Assessment: Plan:   Will continue sertraline 100 mg daily for anxiety and depression. Continue BuSpar 15 mg twice daily for anxiety. Will continue alprazolam 0.5 mg 1/2 to 1 tablet daily as needed for severe anxiety or panic.  Discussed that alprazolam is detected in breastmilk and recommended waiting 8-12 hours after taking Xanax before breastfeeding and feeding infant either stored breast milk or formula during that time.  Patient to follow-up in 6 weeks or sooner if clinically indicated. Patient advised to contact office with any questions, adverse effects, or acute worsening in signs and symptoms.   Generalized anxiety disorder  Please see After Visit Summary for patient specific instructions.  No future appointments.  No orders of the defined types were placed in this encounter.     -------------------------------

## 2018-08-11 DIAGNOSIS — Z1389 Encounter for screening for other disorder: Secondary | ICD-10-CM | POA: Diagnosis not present

## 2018-10-25 ENCOUNTER — Other Ambulatory Visit: Payer: Self-pay | Admitting: Psychiatry

## 2018-10-25 DIAGNOSIS — F411 Generalized anxiety disorder: Secondary | ICD-10-CM

## 2018-11-06 ENCOUNTER — Ambulatory Visit (INDEPENDENT_AMBULATORY_CARE_PROVIDER_SITE_OTHER): Payer: BC Managed Care – PPO | Admitting: Psychiatry

## 2018-11-06 ENCOUNTER — Other Ambulatory Visit: Payer: Self-pay

## 2018-11-06 ENCOUNTER — Encounter: Payer: Self-pay | Admitting: Psychiatry

## 2018-11-06 DIAGNOSIS — F411 Generalized anxiety disorder: Secondary | ICD-10-CM

## 2018-11-06 MED ORDER — ALPRAZOLAM 0.5 MG PO TABS: 0.5000 mg | ORAL_TABLET | Freq: Two times a day (BID) | ORAL | 1 refills | Status: DC | PRN

## 2018-11-06 MED ORDER — SERTRALINE HCL 100 MG PO TABS: 100.0000 mg | ORAL_TABLET | Freq: Every day | ORAL | 1 refills | Status: DC

## 2018-11-06 MED ORDER — BUSPIRONE HCL 15 MG PO TABS: 15.0000 mg | ORAL_TABLET | Freq: Two times a day (BID) | ORAL | 0 refills | Status: DC

## 2018-11-06 NOTE — Progress Notes (Signed)
Jennifer Vincent [id number] 09/01/1990 28 y.o.  Virtual Visit via Telephone Note  I connected with pt on 11/06/18 at  2:15 PM EDT by telephone and verified that I am speaking with the correct person using two identifiers.   I discussed the limitations, risks, security and privacy concerns of performing an evaluation and management service by telephone and the availability of in person appointments. I also discussed with the patient that there may be a patient responsible charge related to this service. The patient expressed understanding and agreed to proceed.   I discussed the assessment and treatment plan with the patient. The patient was provided an opportunity to ask questions and all were answered. The patient agreed with the plan and demonstrated an understanding of the instructions.   The patient was advised to call back or seek an in-person evaluation if the symptoms worsen or if the condition fails to improve as anticipated.  I provided 30 minutes of non-face-to-face time during this encounter.  The patient was located at home.  [facility]   Jennifer ChiquitoJessica Lamaj Vincent, PMHNP   Subjective:   Patient ID:  Jennifer Vincent is a 28 y.o. (DOB 02/24/1991) female.  Chief Complaint:  Chief Complaint  Patient presents with  . Anxiety  . Panic Attack    HPI Jennifer Vincent presents for follow-up of anxiety. She reports that she is doing "ok." She reports that she is in her second week back to work and having some difficulty with return to work and reports that it has been harder than she expected. She reports that she has had more anxiety and is tearful- "overwhelmed teary." She reports that she has had a few panic attacks this week for the first time in awhile. Has been having increase in anxious thoughts and worry. Reports that she has had some intrusive thoughts and catastrophic thinking. Noticed HA's earlier this week and it could be from eye strain, working with  florescent lights, and anxiety. Has had some tremor over the past week. Has also noticed some chest tightness and SOB. Denies depressed mood. Sleeping ok. Took Xanax Monday, Tuesday, and Wednesday nights. She reports that appetite has been good. Energy and motivation have been ok. Reports that motivation to work has been lower. Has been trying to exercising and recently bought a Peloton. Has been having difficulty with concentration this. Denies SI.   Monday was first day of school. K-2 in person and the remainder of the grades are doing remote learning.   Currently breastfeeding and is in process of weaning. Reports that she will pump and dump after taking  Past Psychiatric Medication Trials: Ativan Xanax Klonopin Sertraline-effective at 100 mg. Increased anxiety at 150 mg daily BuSpar-effective  Review of Systems:  Review of Systems  Musculoskeletal: Negative for gait problem.  Neurological: Positive for tremors and headaches.  Psychiatric/Behavioral:       Please refer to HPI    Medications: I have reviewed the patient's current medications.  Current Outpatient Medications  Medication Sig Dispense Refill  . ALPRAZolam (XANAX) 0.5 MG tablet Take 1 tablet (0.5 mg total) by mouth 2 (two) times daily as needed for anxiety. 60 tablet 1  . busPIRone (BUSPAR) 15 MG tablet Take 1 tablet (15 mg total) by mouth 2 (two) times daily. 180 tablet 0  . Prenatal Vit-Fe Sulfate-FA (PRENATAL MULTIVIT-IRON PO) Take by mouth.    . sertraline (ZOLOFT) 100 MG tablet Take 1 tablet (100 mg total) by mouth daily. 90 tablet 1  No current facility-administered medications for this visit.     Medication Side Effects: None  Allergies: No Known Allergies  Past Medical History:  Diagnosis Date  . Allergic rhinitis   . Anxiety   . Frequent headaches   . IBS (irritable bowel syndrome)   . PCOS (polycystic ovarian syndrome)     Family History  Problem Relation Age of Onset  . Depression Mother    . Eating disorder Mother   . Arthritis Father   . Arthritis Brother   . Alcohol abuse Brother   . Mental illness Brother   . Depression Brother   . Anxiety disorder Brother   . Mental illness Maternal Grandfather   . Anxiety disorder Maternal Grandfather   . OCD Maternal Grandfather   . Heart disease Paternal Grandfather   . Diabetes Paternal Grandfather     Social History   Socioeconomic History  . Marital status: Married    Spouse name: Not on file  . Number of children: Not on file  . Years of education: Not on file  . Highest education level: Not on file  Occupational History  . Not on file  Social Needs  . Financial resource strain: Not on file  . Food insecurity    Worry: Not on file    Inability: Not on file  . Transportation needs    Medical: Not on file    Non-medical: Not on file  Tobacco Use  . Smoking status: Never Smoker  . Smokeless tobacco: Never Used  Substance and Sexual Activity  . Alcohol use: Yes    Comment: 1x per week, 1 drink  . Drug use: Never  . Sexual activity: Yes  Lifestyle  . Physical activity    Days per week: Not on file    Minutes per session: Not on file  . Stress: Not on file  Relationships  . Social Herbalist on phone: Not on file    Gets together: Not on file    Attends religious service: Not on file    Active member of club or organization: Not on file    Attends meetings of clubs or organizations: Not on file    Relationship status: Not on file  . Intimate partner violence    Fear of current or ex partner: Not on file    Emotionally abused: Not on file    Physically abused: Not on file    Forced sexual activity: Not on file  Other Topics Concern  . 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   Please see review of systems for further details on the patient's review from today.   Objective:   Physical Exam:  There were no vitals taken for this visit.  Physical Exam Neurological:     Mental Status: She is alert and oriented to person, place, and time.     Cranial Nerves: No dysarthria.  Psychiatric:        Attention and Perception: Attention normal.        Mood and Affect: Mood is anxious.        Speech: Speech normal.  Behavior: Behavior is cooperative.        Thought Content: Thought content normal. Thought content is not paranoid or delusional. Thought content does not include homicidal or suicidal ideation. Thought content does not include homicidal or suicidal plan.        Cognition and Memory: Cognition and memory normal.        Judgment: Judgment normal.     Lab Review:     Component Value Date/Time   NA 138 09/20/2016 1413   K 4.3 09/20/2016 1413   CL 103 09/20/2016 1413   CO2 29 09/20/2016 1413   GLUCOSE 92 09/20/2016 1413   BUN 9 09/20/2016 1413   CREATININE 0.78 09/20/2016 1413   CALCIUM 9.6 09/20/2016 1413       Component Value Date/Time   WBC 21.1 (H) 07/04/2018 0451   RBC 3.94 07/04/2018 0451   HGB 12.1 07/04/2018 0451   HCT 36.6 07/04/2018 0451   PLT 182 07/04/2018 0451   MCV 92.9 07/04/2018 0451   MCH 30.7 07/04/2018 0451   MCHC 33.1 07/04/2018 0451   RDW 14.1 07/04/2018 0451   LYMPHSABS 1.9 09/20/2016 1413   MONOABS 0.3 09/20/2016 1413   EOSABS 0.0 09/20/2016 1413   BASOSABS 0.1 09/20/2016 1413    No results found for: POCLITH, LITHIUM   No results found for: PHENYTOIN, PHENOBARB, VALPROATE, CBMZ   .res Assessment: Plan:   Pt seen for 30 minutes and greater than 50% of visit spent counseling pt and coordinating care. Discussed potential benefits, risks, and side effects of several tx options to improve anxiety to include increasing Buspar  and starting Propranolol. Also discussed safety of medications while breastfeeding .Pt reports that she would prefer to increase Buspar and not start any new medications at this time.  Recommended increasing Buspar to 15 mg either 1 and 1/3 tab (20 mg) or 1.5 tabs (22.5 mg) BID for at least one week, and then may increase Buspar to 30 mg BID as tolerated.  Recommended considering taking Xanax prn twice daily over the weekend, and pumping after taking Xanax, to stabilize acute anxiety and panic since pt does not have to work and her husband can assist with the baby if needed. Discussed that therapy is available via telehealth if she would like to resume therapy. Pt reports that she will consider this.  Pt to f/u in 4 weeks or sooner if clinically indicated.  Jennifer Vincent was seen today for anxiety and panic attack.  Diagnoses and all orders for this visit:  Generalized anxiety disorder -     busPIRone (BUSPAR) 15 MG tablet; Take 1 tablet (15 mg total) by mouth 2 (two) times daily. -     sertraline (ZOLOFT) 100 MG tablet; Take 1 tablet (100 mg total) by mouth daily. -     ALPRAZolam (XANAX) 0.5 MG tablet; Take 1 tablet (0.5 mg total) by mouth 2 (two) times daily as needed for anxiety.    Please see After Visit Summary for patient specific instructions.  No future appointments.  No orders of the defined types were placed in this encounter.     -------------------------------

## 2018-11-26 ENCOUNTER — Ambulatory Visit: Payer: BLUE CROSS/BLUE SHIELD | Admitting: Psychiatry

## 2018-11-26 ENCOUNTER — Encounter

## 2018-12-01 DIAGNOSIS — K625 Hemorrhage of anus and rectum: Secondary | ICD-10-CM | POA: Diagnosis not present

## 2018-12-01 DIAGNOSIS — L659 Nonscarring hair loss, unspecified: Secondary | ICD-10-CM | POA: Diagnosis not present

## 2019-01-04 ENCOUNTER — Other Ambulatory Visit: Payer: Self-pay

## 2019-01-04 DIAGNOSIS — Z20822 Contact with and (suspected) exposure to covid-19: Secondary | ICD-10-CM

## 2019-01-06 LAB — NOVEL CORONAVIRUS, NAA: SARS-CoV-2, NAA: NOT DETECTED

## 2019-01-13 ENCOUNTER — Telehealth: Payer: Self-pay | Admitting: *Deleted

## 2019-01-13 ENCOUNTER — Telehealth: Payer: Self-pay | Admitting: Psychiatry

## 2019-01-13 ENCOUNTER — Other Ambulatory Visit: Payer: Self-pay | Admitting: Adult Health

## 2019-01-13 ENCOUNTER — Other Ambulatory Visit: Payer: Self-pay

## 2019-01-13 ENCOUNTER — Telehealth (INDEPENDENT_AMBULATORY_CARE_PROVIDER_SITE_OTHER): Payer: BC Managed Care – PPO | Admitting: Adult Health

## 2019-01-13 DIAGNOSIS — G43011 Migraine without aura, intractable, with status migrainosus: Secondary | ICD-10-CM | POA: Diagnosis not present

## 2019-01-13 MED ORDER — NAPROXEN 500 MG PO TABS: 500.0000 mg | ORAL_TABLET | Freq: Two times a day (BID) | ORAL | 0 refills | Status: DC

## 2019-01-13 MED ORDER — SUMATRIPTAN SUCCINATE 50 MG PO TABS: 50.0000 mg | ORAL_TABLET | ORAL | 0 refills | Status: DC | PRN

## 2019-01-13 MED ORDER — ONDANSETRON HCL 4 MG PO TABS: 4.0000 mg | ORAL_TABLET | Freq: Three times a day (TID) | ORAL | 0 refills | Status: DC | PRN

## 2019-01-13 NOTE — Progress Notes (Signed)
Virtual Visit via Video Note  I connected with Jennifer Vincent on 01/13/19 at  3:30 PM EDT by a video enabled telemedicine application and verified that I am speaking with the correct person using two identifiers.  Location patient: home Location provider:work or home office Persons participating in the virtual visit: patient, provider  I discussed the limitations of evaluation and management by telemedicine and the availability of in person appointments. The patient expressed understanding and agreed to proceed.   HPI: 28 year old female who is being evaluated today for migraine headache.  She has a history of migraine headaches in the past has not had a migraine in the last 10 to 12 years.  In the past she is used Imitrex and Topamax which seemed to work well for her but Topamax made her feel jittery.  For the last 2 days she has had a migraine headache with accompanied by photophobia and phonophobia as well as nausea.  She has tried over-the-counter Motrin and Aleve without any improvement.  Ranges in the weather and stress usually causes headaches for her.  No fevers, chills, vomiting, or abdominal pain.   ROS: See pertinent positives and negatives per HPI.  Past Medical History:  Diagnosis Date  . Allergic rhinitis   . Anxiety   . Frequent headaches   . IBS (irritable bowel syndrome)   . PCOS (polycystic ovarian syndrome)     Past Surgical History:  Procedure Laterality Date  . TONSILLECTOMY      Family History  Problem Relation Age of Onset  . Depression Mother   . Eating disorder Mother   . Arthritis Father   . Arthritis Brother   . Alcohol abuse Brother   . Mental illness Brother   . Depression Brother   . Anxiety disorder Brother   . Mental illness Maternal Grandfather   . Anxiety disorder Maternal Grandfather   . OCD Maternal Grandfather   . Heart disease Paternal Grandfather   . Diabetes Paternal Grandfather       Current Outpatient Medications:  .   ALPRAZolam (XANAX) 0.5 MG tablet, Take 1 tablet (0.5 mg total) by mouth 2 (two) times daily as needed for anxiety., Disp: 60 tablet, Rfl: 1 .  busPIRone (BUSPAR) 15 MG tablet, Take 1 tablet (15 mg total) by mouth 2 (two) times daily., Disp: 180 tablet, Rfl: 0 .  Prenatal Vit-Fe Sulfate-FA (PRENATAL MULTIVIT-IRON PO), Take by mouth., Disp: , Rfl:  .  sertraline (ZOLOFT) 100 MG tablet, Take 1 tablet (100 mg total) by mouth daily., Disp: 90 tablet, Rfl: 1  EXAM:  VITALS per patient if applicable:  GENERAL: alert, oriented, appears well and in no acute distress  HEENT: atraumatic, conjunttiva clear, no obvious abnormalities on inspection of external nose and ears  NECK: normal movements of the head and neck  LUNGS: on inspection no signs of respiratory distress, breathing rate appears normal, no obvious gross SOB, gasping or wheezing  CV: no obvious cyanosis  MS: moves all visible extremities without noticeable abnormality  PSYCH/NEURO: pleasant and cooperative, no obvious depression or anxiety, speech and thought processing grossly intact  ASSESSMENT AND PLAN:  Discussed the following assessment and plan:  1. Intractable migraine without aura and with status migrainosus - Follow up if no improvement in the next 24 hours  - SUMAtriptan (IMITREX) 50 MG tablet; Take 1 tablet (50 mg total) by mouth every 2 (two) hours as needed for migraine. May repeat in 2 hours if headache persists or recurs.  Dispense: 10 tablet;  Refill: 0 - naproxen (NAPROSYN) 500 MG tablet; Take 1 tablet (500 mg total) by mouth 2 (two) times daily with a meal.  Dispense: 30 tablet; Refill: 0 - ondansetron (ZOFRAN) 4 MG tablet; Take 1 tablet (4 mg total) by mouth every 8 (eight) hours as needed for nausea or vomiting.  Dispense: 20 tablet; Refill: 0   I discussed the assessment and treatment plan with the patient. The patient was provided an opportunity to ask questions and all were answered. The patient agreed with  the plan and demonstrated an understanding of the instructions.   The patient was advised to call back or seek an in-person evaluation if the symptoms worsen or if the condition fails to improve as anticipated.   Dorothyann Peng, NP

## 2019-01-13 NOTE — Telephone Encounter (Signed)
Received return call from Dr. Quay Burow that prescription was being sent to the pharmacy.Patient called and notified that new prescription was being sent to the pharmacy and to check with pharmacy to see when it would be available for pickup. Pt verbalized understanding.

## 2019-01-13 NOTE — Telephone Encounter (Signed)
Spoke with Alroy Dust, the pharmacist who states that a fax was sent earlier today regarding prescription of Sumatriptan 50 mg tab. Alroy Dust states that the instructions currently could be dangerous for the patient to take as written. Alroy Dust states that instructions usually state to take 1 tablet for one dose as needed and may take an additional dose after a certain amount of time or a max daily dose needs to be specified on current instructions.  On call provider Dr. Quay Burow called and left message to return call for additional information.

## 2019-01-13 NOTE — Telephone Encounter (Signed)
Refills sent by [facility] today.

## 2019-01-13 NOTE — Telephone Encounter (Signed)
Patient went to pharmacy to pick upo prescription and was told refill was sent back . She needs this medication for her migraines.   Please advise .  Please reach out to Pharmacy    [facility] [place], [place]  [address] [place]  [address]  Phone: [phone] Fax: [phone]   Medication  SUMAtriptan (IMITREX) 50 MG tablet [[id number]]

## 2019-01-13 NOTE — Addendum Note (Signed)
Addended by: Binnie Rail on: 01/13/2019 06:07 PM   Modules accepted: Orders

## 2019-01-13 NOTE — Telephone Encounter (Signed)
Jennifer Vincent called to because she is having a high panic day.  She took a Xanax this morning at 7:00am.  She wants to take another dose.  When can she take the 2nd dose?  Please call soon

## 2019-01-13 NOTE — Telephone Encounter (Signed)
Medication Refill - Medication: SUMAtriptan (IMITREX) 50 MG tablet [[id number]]     Preferred Pharmacy (with phone number or street name):  [facility] [place] [facility], [place] [facility] [place]  [address] [place] [address]  Phone: [phone] Fax: [phone]     Agent: Please be advised that RX refills may take up to 3 business days. We ask that you follow-up with your pharmacy.     Please contact Pharmacy on directions for patient to take the medication

## 2019-01-13 NOTE — Telephone Encounter (Signed)
Pt. Made aware. She said yesterday and today have been really bad. She did make an appt. With Jessica for 01/22/19. Pt. Has has a lot of stress going on, her mother is sick and they are not sure what is going on with her. She has had a rough 36 hours, has had a migraine and things are just not going well at the moment. Can she increase Xanax to 2 tablets daily and 1 PRN just for a few days or until her appt? Please advise.

## 2019-01-13 NOTE — Telephone Encounter (Signed)
rx resent  

## 2019-01-14 ENCOUNTER — Telehealth: Payer: Self-pay | Admitting: Psychiatry

## 2019-01-14 NOTE — Telephone Encounter (Signed)
Left pt. A detailed message on her personal phone with information from all messages tied to this one. Asked her to call back with any questions or concerns.

## 2019-01-14 NOTE — Telephone Encounter (Signed)
See previous phone message. 

## 2019-01-14 NOTE — Telephone Encounter (Signed)
PT had to take Imitrex this am due to a migraine. Wants to know if she can take Xanax today with having the imitrex?

## 2019-01-18 ENCOUNTER — Encounter: Payer: Self-pay | Admitting: Family Medicine

## 2019-01-18 ENCOUNTER — Other Ambulatory Visit: Payer: Self-pay

## 2019-01-18 ENCOUNTER — Ambulatory Visit: Payer: BC Managed Care – PPO | Admitting: Family Medicine

## 2019-01-18 VITALS — BP 130/80 | HR 109 | Temp 97.9°F | Ht 68.0 in | Wt 219.6 lb

## 2019-01-18 DIAGNOSIS — M2550 Pain in unspecified joint: Secondary | ICD-10-CM | POA: Diagnosis not present

## 2019-01-18 DIAGNOSIS — F419 Anxiety disorder, unspecified: Secondary | ICD-10-CM

## 2019-01-18 DIAGNOSIS — G43809 Other migraine, not intractable, without status migrainosus: Secondary | ICD-10-CM | POA: Diagnosis not present

## 2019-01-18 DIAGNOSIS — R5383 Other fatigue: Secondary | ICD-10-CM

## 2019-01-18 DIAGNOSIS — D72829 Elevated white blood cell count, unspecified: Secondary | ICD-10-CM

## 2019-01-18 LAB — CBC WITH DIFFERENTIAL/PLATELET
Basophils Absolute: 0 10*3/uL (ref 0.0–0.1)
Basophils Relative: 0.9 % (ref 0.0–3.0)
Eosinophils Absolute: 0 10*3/uL (ref 0.0–0.7)
Eosinophils Relative: 0.5 % (ref 0.0–5.0)
HCT: 44 % (ref 36.0–46.0)
Hemoglobin: 14.8 g/dL (ref 12.0–15.0)
Lymphocytes Relative: 27.4 % (ref 12.0–46.0)
Lymphs Abs: 1.5 10*3/uL (ref 0.7–4.0)
MCHC: 33.6 g/dL (ref 30.0–36.0)
MCV: 92 fl (ref 78.0–100.0)
Monocytes Absolute: 0.3 10*3/uL (ref 0.1–1.0)
Monocytes Relative: 5.5 % (ref 3.0–12.0)
Neutro Abs: 3.6 10*3/uL (ref 1.4–7.7)
Neutrophils Relative %: 65.7 % (ref 43.0–77.0)
Platelets: 247 10*3/uL (ref 150.0–400.0)
RBC: 4.78 Mil/uL (ref 3.87–5.11)
RDW: 12.5 % (ref 11.5–15.5)
WBC: 5.5 10*3/uL (ref 4.0–10.5)

## 2019-01-18 LAB — TSH: TSH: 0.81 u[IU]/mL (ref 0.35–4.50)

## 2019-01-18 LAB — COMPREHENSIVE METABOLIC PANEL
ALT: 10 U/L (ref 0–35)
AST: 14 U/L (ref 0–37)
Albumin: 4.7 g/dL (ref 3.5–5.2)
Alkaline Phosphatase: 54 U/L (ref 39–117)
BUN: 9 mg/dL (ref 6–23)
CO2: 28 mEq/L (ref 19–32)
Calcium: 9.6 mg/dL (ref 8.4–10.5)
Chloride: 103 mEq/L (ref 96–112)
Creatinine, Ser: 0.68 mg/dL (ref 0.40–1.20)
GFR: 102.24 mL/min (ref >60.00)
Glucose, Bld: 95 mg/dL (ref 70–99)
Potassium: 3.7 mEq/L (ref 3.5–5.1)
Sodium: 140 mEq/L (ref 135–145)
Total Bilirubin: 0.4 mg/dL (ref 0.2–1.2)
Total Protein: 7.2 g/dL (ref 6.0–8.3)

## 2019-01-18 LAB — VITAMIN D 25 HYDROXY (VIT D DEFICIENCY, FRACTURES): VITD: 33.46 ng/mL (ref 30.00–100.00)

## 2019-01-18 LAB — VITAMIN B12: Vitamin B-12: 531 pg/mL (ref 211–911)

## 2019-01-18 LAB — C-REACTIVE PROTEIN: CRP: <1 mg/dL (ref 0.5–20.0)

## 2019-01-18 LAB — SEDIMENTATION RATE: Sed Rate: 4 mm/hr (ref 0–20)

## 2019-01-18 LAB — URIC ACID: Uric Acid, Serum: 3.5 mg/dL (ref 2.4–7.0)

## 2019-01-18 NOTE — Progress Notes (Signed)
Jennifer Vincent DOB: 05/16/1990 Encounter date: 01/18/2019  This is a 28 y.o. female who presents to establish care. Chief Complaint  Patient presents with  . Establish Care    History of present illness:   Main reason she wanted to come in was joint pain.  A couple years ago saw Dr. Maudie Mercury for this. Bothered her on and off for 5 years. Always bad in hands, but recently feeling it in knees, ankles, but comes and goes. Has 3 mo old so it did bother her during pregnancy as well. When she called to make appointment she couldn't even make a bottle for daughter due to hands being in pain. Looks swollen to her sometimes. Today discomfort is at a 4-5/10. Sometimes knuckles look red to her. Rings hard to get off sometimes. Rainy weather is worse; stormy days are worse but these also trigger bad headaches for her. Pain seems worse at night (might be related to her slowing down); can wake at night with it being stiff. Doesn't feel like ibuprofen helps and doesn't want to be taking it all the time. Doesn't really have muscle pain. Not really having back or neck pain.   Dad and brother both have arthritis.   She did feel better when she was pregnant.   Allergic rhinitis: FGH:WEXHBZJ issue. Stable now. Was improved when pregnant.  Anxiety: following with therapist. On zoloft. Headaches: didn't feel like the sumatriptan helped when she took it.    Past Medical History:  Diagnosis Date  . Allergic rhinitis   . Anxiety   . Frequent headaches   . IBS (irritable bowel syndrome)   . PCOS (polycystic ovarian syndrome)    Past Surgical History:  Procedure Laterality Date  . TONSILLECTOMY     Allergies  Allergen Reactions  . Lactose Intolerance (Gi)     GI upset   Current Meds  Medication Sig  . busPIRone (BUSPAR) 15 MG tablet Take 1 tablet (15 mg total) by mouth 2 (two) times daily.  . naproxen (NAPROSYN) 500 MG tablet Take 1 tablet (500 mg total) by mouth 2 (two) times daily with a meal.  .  ondansetron (ZOFRAN) 4 MG tablet Take 1 tablet (4 mg total) by mouth every 8 (eight) hours as needed for nausea or vomiting.  . sertraline (ZOLOFT) 100 MG tablet Take 1 tablet (100 mg total) by mouth daily.  . SUMAtriptan (IMITREX) 50 MG tablet Take 1 tablet (50 mg total) by mouth as needed for up to 1 dose for migraine. May repeat in 2 hours if headache persists or recurs.   Social History   Tobacco Use  . Smoking status: Never Smoker  . Smokeless tobacco: Never Used  Substance Use Topics  . Alcohol use: Yes    Comment: 1x per week, 1 drink   Family History  Problem Relation Age of Onset  . Depression Mother   . Eating disorder Mother   . Arthritis Father   . Crohn's disease Father   . Arthritis Brother   . Alcohol abuse Brother   . Mental illness Brother   . Depression Brother   . Anxiety disorder Brother   . Crohn's disease Brother   . Mental illness Maternal Grandfather   . Anxiety disorder Maternal Grandfather   . OCD Maternal Grandfather   . Heart disease Paternal Grandfather   . Diabetes Paternal Grandfather      Review of Systems  Objective:  BP 130/80 (BP Location: Left Arm, Patient Position: Sitting, Cuff Size: Large)  Pulse (!) 109   Temp 97.9 F (36.6 C) (Temporal)   Ht 5\' 8"  (1.727 m)   Wt 219 lb 9.6 oz (99.6 kg)   LMP 01/01/2019 (Exact Date)   SpO2 98%   Breastfeeding No   BMI 33.39 kg/m   Weight: 219 lb 9.6 oz (99.6 kg)   BP Readings from Last 3 Encounters:  01/18/19 130/80  07/04/18 118/82  04/24/18 95/61   Wt Readings from Last 3 Encounters:  01/18/19 219 lb 9.6 oz (99.6 kg)  07/03/18 230 lb (104.3 kg)  01/16/18 207 lb (93.9 kg)    Physical Exam Constitutional:      General: She is not in acute distress.    Appearance: She is well-developed.  Cardiovascular:     Rate and Rhythm: Normal rate and regular rhythm.     Heart sounds: Normal heart sounds. No murmur. No friction rub.  Pulmonary:     Effort: Pulmonary effort is normal. No  respiratory distress.     Breath sounds: Normal breath sounds. No wheezing or rales.  Abdominal:     General: Bowel sounds are normal. There is no distension.     Palpations: Abdomen is soft.     Tenderness: There is no abdominal tenderness.  Musculoskeletal:     Right lower leg: No edema.     Left lower leg: No edema.     Comments: There is some appreciated bogginess in the PIP joints of the fingers bilaterally.  Otherwise no joint inflammations appreciated today, although patient is not in pain today either.  She has full mobility of other joints.  There is slight tenderness of the lateral epicondyle on the right, but this is minimal.  There is no pain with joint mobility.  Skin:    General: Skin is warm.  Neurological:     Mental Status: She is alert and oriented to person, place, and time.  Psychiatric:        Mood and Affect: Mood normal.        Behavior: Behavior normal.     Assessment/Plan:  1. Anxiety Current joint pain is causing more anxiety.  On Zoloft.  Actively in therapy.  2. Arthralgia, unspecified joint I do feel she has some underlying process going on contributing to sporadic joint inflammation.  Previous blood work was negative.  We will repeat some additional blood work today.  If this is negative, I do think that it would be reasonable for her to follow-up with rheumatology as I do suspect there is something underlying triggering this joint inflammation and significant pain. - ANA; Future - C-reactive protein; Future - Rheumatoid factor; Future - Sedimentation rate; Future - Uric acid; Future - Comprehensive metabolic panel; Future  3. Leukocytosis, unspecified type - CBC with Differential/Platelet; Future  Return for follow up based on lab results.  13/01/19, MD

## 2019-01-19 ENCOUNTER — Other Ambulatory Visit: Payer: Self-pay | Admitting: Family Medicine

## 2019-01-19 ENCOUNTER — Other Ambulatory Visit: Payer: Self-pay

## 2019-01-19 ENCOUNTER — Encounter: Payer: Self-pay | Admitting: Psychiatry

## 2019-01-19 ENCOUNTER — Ambulatory Visit (INDEPENDENT_AMBULATORY_CARE_PROVIDER_SITE_OTHER): Payer: BC Managed Care – PPO | Admitting: Psychiatry

## 2019-01-19 DIAGNOSIS — F411 Generalized anxiety disorder: Secondary | ICD-10-CM | POA: Diagnosis not present

## 2019-01-19 DIAGNOSIS — G43719 Chronic migraine without aura, intractable, without status migrainosus: Secondary | ICD-10-CM | POA: Diagnosis not present

## 2019-01-19 DIAGNOSIS — Z049 Encounter for examination and observation for unspecified reason: Secondary | ICD-10-CM | POA: Diagnosis not present

## 2019-01-19 MED ORDER — PREDNISONE 20 MG PO TABS: ORAL_TABLET | ORAL | 0 refills | Status: DC

## 2019-01-19 NOTE — Progress Notes (Signed)
Jennifer Vincent [id number] Nov 01, 1990 28 y.o.  Virtual Visit via Telephone Note  I connected with pt on 01/19/19 at  2:30 PM EST by telephone and verified that I am speaking with the correct person using two identifiers.   I discussed the limitations, risks, security and privacy concerns of performing an evaluation and management service by telephone and the availability of in person appointments. I also discussed with the patient that there may be a patient responsible charge related to this service. The patient expressed understanding and agreed to proceed.   I discussed the assessment and treatment plan with the patient. The patient was provided an opportunity to ask questions and all were answered. The patient agreed with the plan and demonstrated an understanding of the instructions.   The patient was advised to call back or seek an in-person evaluation if the symptoms worsen or if the condition fails to improve as anticipated.  I provided 25 minutes of non-face-to-face time during this encounter.  The patient was located at home.  The provider was located at [facility].   Corie Chiquito, PMHNP   Subjective:   Patient ID:  Jennifer Vincent is a 28 y.o. (DOB [birth year]) female.  Chief Complaint:  Chief Complaint  Patient presents with  . Anxiety    HPI Jennifer Vincent presents for follow-up of anxiety.   She reports that there was a "health scare" involving her mother who tested positive for COVID. Health- related stress is a significant trigger to her anxiety. Pt also had severe migraine with nausea. Saw a neurologist today. She reports that she thinks that her anxiety had been gradually building up and she was not as aware of this. She reports that she has been seeking medical tx for physical s/s.  Reports that last week she was not able to go to work last week due to severe anxiety and migraines. She reports that taking Alprazolam BID was somewhat helpful for her anxiety. She  reports that the day her migraine and anxiety was most severe she did not notice any significant improvement in anxiety with Xanax 0.5 mg po qd.   "I can breathe a little bit." She reports some improvement in anxiety after seeing PCP and neurologist. She reports that she is continuing to have some anxiety and anxious thoughts re: pending lab results. Reports that last panic s/s occurred yesterday. Had racing heart rate in anticipation of PCP apt. Denies depressed mood. She reports that she has been sleeping ok. Appetite has been decreased since migraine. Concentration has been better today.  Denies SI.   Will start Zonisamide tonight. Prescribed Baclofen prn migraines.   Has apt to start therapy.   Past Psychiatric Medication Trials: Ativan Xanax Klonopin Sertraline-effective at 100 mg. Increased anxiety at 150 mg daily BuSpar-effective  Review of Systems:  Review of Systems  Musculoskeletal: Positive for arthralgias and joint swelling. Negative for gait problem.  Neurological: Positive for headaches. Negative for tremors.       Reports that she has had long-standing daily headaches. Recent severe migraine  Psychiatric/Behavioral:       Please refer to HPI    Medications: Medications reviewed  Current Outpatient Medications  Medication Sig Dispense Refill  . ALPRAZolam (XANAX) 0.5 MG tablet Take 1 tablet (0.5 mg total) by mouth 2 (two) times daily as needed for anxiety. 60 tablet 1  . busPIRone (BUSPAR) 15 MG tablet Take 1 tablet (15 mg total) by mouth 2 (two) times daily. 180 tablet 0  . naproxen (NAPROSYN)  500 MG tablet Take 1 tablet (500 mg total) by mouth 2 (two) times daily with a meal. 30 tablet 0  . ondansetron (ZOFRAN) 4 MG tablet Take 1 tablet (4 mg total) by mouth every 8 (eight) hours as needed for nausea or vomiting. 20 tablet 0  . predniSONE (DELTASONE) 20 MG tablet Take 3 tabs daily x 3 days, 2 tabs daily x 3 days, 1 tab daily x 2 days, 1/2 tab daily x 2 days 18  tablet 0  . sertraline (ZOLOFT) 100 MG tablet Take 1 tablet (100 mg total) by mouth daily. 90 tablet 1  . SUMAtriptan (IMITREX) 50 MG tablet Take 1 tablet (50 mg total) by mouth as needed for up to 1 dose for migraine. May repeat in 2 hours if headache persists or recurs. 10 tablet 0   No current facility-administered medications for this visit.     Medication Side Effects: None  Allergies:  Allergies  Allergen Reactions  . Lactose Intolerance (Gi)     GI upset    Past Medical History:  Diagnosis Date  . Allergic rhinitis   . Anxiety   . Frequent headaches   . IBS (irritable bowel syndrome)   . PCOS (polycystic ovarian syndrome)     Family History  Problem Relation Age of Onset  . Depression Mother   . Eating disorder Mother   . Arthritis Father   . Crohn's disease Father   . Arthritis Brother   . Alcohol abuse Brother   . Mental illness Brother   . Depression Brother   . Anxiety disorder Brother   . Crohn's disease Brother   . Mental illness Maternal Grandfather   . Anxiety disorder Maternal Grandfather   . OCD Maternal Grandfather   . Heart disease Paternal Grandfather   . Diabetes Paternal Grandfather     Social History   Socioeconomic History  . Marital status: Married    Spouse name: Not on file  . Number of children: Not on file  . Years of education: Not on file  . Highest education level: Not on file  Occupational History  . Not on file  Social Needs  . Financial resource strain: Not on file  . Food insecurity    Worry: Not on file    Inability: Not on file  . Transportation needs    Medical: Not on file    Non-medical: Not on file  Tobacco Use  . Smoking status: Never Smoker  . Smokeless tobacco: Never Used  Substance and Sexual Activity  . Alcohol use: Yes    Comment: 1x per week, 1 drink  . Drug use: Never  . Sexual activity: Yes  Lifestyle  . Physical activity    Days per week: Not on file    Minutes per session: Not on file  .  Stress: Not on file  Relationships  . Social Musician on phone: Not on file    Gets together: Not on file    Attends religious service: Not on file    Active member of club or organization: Not on file    Attends meetings of clubs or organizations: Not on file    Relationship status: Not on file  . Intimate partner violence    Fear of current or ex partner: Not on file    Emotionally abused: Not on file    Physically abused: Not on file    Forced sexual activity: Not on file  Other Topics Concern  .  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   Please see review of systems for further details on the patient's review from today.   Objective:   Physical Exam:  LMP 01/01/2019 (Exact Date)   Physical Exam Neurological:     Mental Status: She is alert and oriented to person, place, and time.     Cranial Nerves: No dysarthria.  Psychiatric:        Attention and Perception: Attention normal.        Mood and Affect: Mood is anxious.        Speech: Speech normal.        Behavior: Behavior is cooperative.        Thought Content: Thought content normal. Thought content is not paranoid or delusional. Thought content does not include homicidal or suicidal ideation. Thought content does not include homicidal or suicidal plan.        Cognition and Memory: Cognition and memory normal.        Judgment: Judgment normal.     Comments: Insight intact     Lab Review:     Component Value Date/Time   NA 140 01/18/2019 1226   K 3.7 01/18/2019 1226   CL 103 01/18/2019 1226   CO2 28 01/18/2019 1226   GLUCOSE 95 01/18/2019 1226   BUN 9 01/18/2019 1226   CREATININE 0.68 01/18/2019  1226   CALCIUM 9.6 01/18/2019 1226   PROT 7.2 01/18/2019 1226   ALBUMIN 4.7 01/18/2019 1226   AST 14 01/18/2019 1226   ALT 10 01/18/2019 1226   ALKPHOS 54 01/18/2019 1226   BILITOT 0.4 01/18/2019 1226       Component Value Date/Time   WBC 5.5 01/18/2019 1226   RBC 4.78 01/18/2019 1226   HGB 14.8 01/18/2019 1226   HCT 44.0 01/18/2019 1226   PLT 247.0 01/18/2019 1226   MCV 92.0 01/18/2019 1226   MCH 30.7 07/04/2018 0451   MCHC 33.6 01/18/2019 1226   RDW 12.5 01/18/2019 1226   LYMPHSABS 1.5 01/18/2019 1226   MONOABS 0.3 01/18/2019 1226   EOSABS 0.0 01/18/2019 1226   BASOSABS 0.0 01/18/2019 1226    No results found for: POCLITH, LITHIUM   No results found for: PHENYTOIN, PHENOBARB, VALPROATE, CBMZ   .res Assessment: Plan:   Patient seen for 25 minutes and greater than 50% of visit spent counseling patient and coordination of care to include discussing recent visits with neurologist and PCP and reviewing PCPs note.  Reiterated that some of her physical signs and symptoms are likely unrelated to anxiety and agreed with plan to continue to work with medical providers for evaluation and treatment of the signs and symptoms. Discussed that she could take Xanax 0.5 mg 2 tabs as needed severe anxiety or can take 1 Xanax 0.5 mg tablet and then take an additional 0.5 mg tablet if ineffective.  Encourage patient to continue to use Xanax as needed to stabilize acute anxiety. Will continue current medications without changes since patient is also adjusting to new medications for treatment of migraines. Patient to follow-up with this provider in 6 weeks or sooner if clinically indicated. Patient advised to contact office with  any questions, adverse effects, or acute worsening in signs and symptoms.   Jennifer Vincent was seen today for anxiety.  Diagnoses and all orders for this visit:  Generalized anxiety disorder    Please see After Visit Summary for patient specific instructions.  No future  appointments.  No orders of the defined types were placed in this encounter.     -------------------------------

## 2019-01-20 ENCOUNTER — Other Ambulatory Visit: Payer: Self-pay | Admitting: Family Medicine

## 2019-01-20 DIAGNOSIS — R768 Other specified abnormal immunological findings in serum: Secondary | ICD-10-CM

## 2019-01-20 DIAGNOSIS — M2550 Pain in unspecified joint: Secondary | ICD-10-CM

## 2019-01-20 LAB — ANTI-NUCLEAR AB-TITER (ANA TITER): ANA Titer 1: 1:320 {titer} — ABNORMAL HIGH

## 2019-01-20 LAB — ANA: Anti Nuclear Antibody (ANA): POSITIVE — AB

## 2019-01-20 LAB — RHEUMATOID FACTOR: Rheumatoid fact SerPl-aCnc: <14 IU/mL (ref <14)

## 2019-01-21 DIAGNOSIS — G43909 Migraine, unspecified, not intractable, without status migrainosus: Secondary | ICD-10-CM | POA: Insufficient documentation

## 2019-01-22 ENCOUNTER — Ambulatory Visit: Payer: BC Managed Care – PPO | Admitting: Psychiatry

## 2019-01-25 NOTE — Progress Notes (Signed)
Office Visit Note  Patient: Jennifer Vincent             Date of Birth: 10-Jul-1990           MRN: [id number]             PCP: Caren Macadam, MD Referring: Caren Macadam, MD Visit Date: 01/26/2019 Occupation: School counselor  Subjective:  New Patient (Initial Visit) (Abnormal labs, joint pain and swelling)   History of Present Illness: Jennifer Vincent is a 28 y.o. female seen in consultation per request of her PCP.  According to patient her symptoms are started about 4 to 5 years ago with a stiffness and dull pain in her ankles and wrist.  She states the symptoms fluctuated over time.  Approximately 2-1/2 years ago she was experiencing increased pain at the time she had labs done by her PCP which were all negative.  She states she was pregnant and had no problems during pregnancy.  Now she is 6 months postpartum.  She stopped nursing about 1-1/2 months ago.  She noticed that the discomfort started coming back at the same time.  She describes pain and discomfort in her elbows, wrist, hands, knees, ankles and feet.  She also has some neck stiffness.  She has noticed occasional redness and swelling in her hands.  She also gives history of nocturnal pain in most of her joints and most severe in her hands.  She had seen her PCP recently and the labs showed positive ANA for that reason she was referred to me.  There is history of Crohn's disease in her brother and ulcerative colitis in her father who also has associated arthritis.  Activities of Daily Living:  Patient reports morning stiffness for 30 minutes.   Patient Reports nocturnal pain.  Difficulty dressing/grooming: Denies Difficulty climbing stairs: Denies Difficulty getting out of chair: Denies Difficulty using hands for taps, buttons, cutlery, and/or writing: Reports  Review of Systems  Constitutional: Positive for fatigue. Negative for night sweats, weight gain and weight loss.  HENT: Positive for mouth dryness. Negative for mouth  sores, trouble swallowing, trouble swallowing and nose dryness.   Eyes: Positive for dryness. Negative for pain, redness and visual disturbance.  Respiratory: Negative for cough, shortness of breath and difficulty breathing.   Cardiovascular: Negative for chest pain, palpitations, hypertension, irregular heartbeat and swelling in legs/feet.  Gastrointestinal: Positive for constipation. Negative for blood in stool and diarrhea.       History of IBS  Endocrine: Negative for excessive thirst and increased urination.  Genitourinary: Negative for difficulty urinating and vaginal dryness.  Musculoskeletal: Positive for arthralgias, joint pain, joint swelling and morning stiffness. Negative for myalgias, muscle weakness, muscle tenderness and myalgias.  Skin: Negative for color change, rash, hair loss, skin tightness, ulcers and sensitivity to sunlight.  Allergic/Immunologic: Negative for susceptible to infections.  Neurological: Positive for headaches. Negative for dizziness, numbness, memory loss, night sweats and weakness.       Migraine headaches  Hematological: Negative for bruising/bleeding tendency and swollen glands.  Psychiatric/Behavioral: Negative for depressed mood and sleep disturbance. The patient is nervous/anxious.     PMFS History:  Patient Active Problem List   Diagnosis Date Noted   Pregnant 07/03/2018   Generalized anxiety disorder 01/11/2018   Panic disorder 10/04/2016   Anxiety 10/04/2016    Past Medical History:  Diagnosis Date   Allergic rhinitis    Anxiety    Frequent headaches    IBS (irritable bowel syndrome)  PCOS (polycystic ovarian syndrome)     Family History  Problem Relation Age of Onset   Depression Mother    Eating disorder Mother    Arthritis Father    Crohn's disease Father    Arthritis Brother    Alcohol abuse Brother    Mental illness Brother    Depression Brother    Anxiety disorder Brother    Crohn's disease Brother      Mental illness Maternal Grandfather    Anxiety disorder Maternal Grandfather    OCD Maternal Grandfather    Heart disease Paternal Grandfather    Diabetes Paternal Grandfather    Past Surgical History:  Procedure Laterality Date   TONSILLECTOMY     Social History   Social History Narrative   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  Constitutional:      Appearance: She is well-developed.  HENT:     Head: Normocephalic and atraumatic.  Eyes:     Conjunctiva/sclera: Conjunctivae normal.  Neck:     Musculoskeletal: Normal range of motion.  Cardiovascular:     Rate and Rhythm: Normal rate and regular rhythm.     Heart sounds: Normal heart sounds.  Pulmonary:     Effort: Pulmonary effort is normal.     Breath sounds: Normal breath sounds.  Abdominal:     General: Bowel sounds are normal.     Palpations: Abdomen is soft.  Lymphadenopathy:     Cervical: No cervical adenopathy.  Skin:    General: Skin is warm and dry.     Capillary Refill: Capillary refill takes less than 2 seconds.  Neurological:     Mental Status: She is alert and oriented to person, place, and time.  Psychiatric:        Behavior: Behavior normal.      Musculoskeletal Exam: C-spine thoracic and lumbar spine were in good range of motion.  She had no SI joint tenderness.  Shoulder joints, elbow joints, wrist joints were in good range of motion.  She had tenderness over wrist  joints, MCPs and PIPs.  No synovitis was noted.  She had good range of motion of bilateral hip joints.  She had discomfort range of motion of her knee joints without any warmth swelling or effusion.  She has discomfort with range of motion of her ankle joints and MTPs.  No warmth swelling or effusion was noted.  CDAI Exam: CDAI Score: -- Patient Global: --; Provider Global: -- Swollen: --; Tender: -- Joint Exam   No joint exam has been documented for this visit   There is currently no information documented on the homunculus. Go to the Rheumatology activity and complete the homunculus joint exam.  Investigation: Findings:  01/18/19: ANA 1:320 NS, uric acid 3.5, sed rate 4, RF<14, CRP<1, vitamin D 33.46, vitamin B12 531, TSH 0.81  Component     Latest Ref Rng & Units 01/18/2019  ANA Titer 1     titer 1:320 (H)  ANA Pattern 1      Nuclear, Speckled (A)  Uric Acid, Serum     2.4 - 7.0 mg/dL 3.5  Sed Rate     0 - 20 mm/hr 4  RA Latex Turbid.     <14 IU/mL <14  CRP     0.5 - 20.0 mg/dL <1.0  Anti Nuclear Antibody (ANA)     NEGATIVE POSITIVE (A)   Component     Latest Ref Rng & Units 01/18/2019  VITD     30.00 - 100.00 ng/mL 33.46  Vitamin B12     211 - 911 pg/mL 531  TSH     0.35 - 4.50 uIU/mL 0.81   Imaging: Xr Foot 2 Views Left  Result Date: 01/26/2019 No MTP, PIP or DIP narrowing was noted.  No intertarsal joint space narrowing was noted.  No erosive changes were noted. Impression: Unremarkable x-ray of the foot.  Xr Foot 2 Views Right  Result Date: 01/26/2019 No MTP PIP or DIP narrowing was noted.  No intertarsal joint space narrowing was noted.  No erosive changes were noted. Impression: Unremarkable x-ray of the foot.  Xr Hand 2 View Left  Result Date: 01/26/2019 No MCP PIP or DIP narrowing was noted.  No intercarpal joint space narrowing was noted.  No erosive changes were noted. Impression: Unremarkable x-ray of the hand.  Xr Hand 2 View Right  Result Date:  01/26/2019 No MCP PIP or DIP narrowing was noted.  No intercarpal joint space narrowing was noted.  No erosive changes were noted. Impression: Unremarkable x-ray of the hand.  Xr Knee 3 View Left  Result Date: 01/26/2019 No medial lateral c ompartment narrowing was noted.  Mild patellofemoral narrowing was noted.  No chondrocalcinosis was noted. Impression: Mild chondromalacia patella of the knee.  Xr Knee 3 View Right  Result Date: 01/26/2019 No medial lateral c ompartment narrowing was noted.  Mild patellofemoral narrowing was noted.  No chondrocalcinosis was noted. Impression: Mild chondromalacia patella of the knee.   Recent Labs: Lab Results  Component Value Date   WBC 5.5 01/18/2019   HGB 14.8 01/18/2019   PLT 247.0 01/18/2019   NA 140 01/18/2019   K 3.7 01/18/2019   CL 103 01/18/2019   CO2 28 01/18/2019   GLUCOSE 95 01/18/2019   BUN 9 01/18/2019   CREATININE 0.68 01/18/2019   BILITOT 0.4 01/18/2019   ALKPHOS 54 01/18/2019   AST 14 01/18/2019   ALT 10 01/18/2019   PROT 7.2 01/18/2019   ALBUMIN 4.7 01/18/2019   CALCIUM 9.6 01/18/2019    Speciality Comments: No specialty comments available.  Procedures:  No procedures performed Allergies: Lactose intolerance (gi)   Assessment / Plan:     Visit Diagnoses: Polyarthralgia-patient complains of pain in multiple joints off and on for several years.  The joint pain has been worse in the last 2 months.  She is 6 months postpartum.  She did really well during pregnancy.  Pain in both hands -she has been experiencing increased pain and discomfort in her hands.  Patient had no synovitis on the examination.  Plan: XR Hand 2 View Right, XR Hand 2 View Left, x-ray of bilateral hands were unremarkable.  CCP, 14-3-3 eta Protein, HLA-B27  Chronic pain of both knees -she complains of pain and discomfort in her bilateral knee joints.  No warmth swelling or  effusion was noted.  Plan: XR KNEE 3 VIEW RIGHT, XR KNEE 3 VIEW LEFT.  X-ray  showed mild chondromalacia patella.  Pain in both feet -she has pain in her bilateral feet and ankles.  No swelling was noted.  She had tenderness on palpation.  Plan: XR Foot 2 Views Right, XR Foot 2 Views Left.  X-ray of bilateral feet were unremarkable.  Other fatigue - Plan: CK (Creatine Kinase), G6PD  Positive ANA (antinuclear antibody) - 01/18/19: ANA 1:320 NS, uric acid 3.5, sed rate 4, RF<14, CRP<1, vitamin D 33.46, vitamin B12 531, TSH 0.81 - Plan: UA w reflex microscopic, Scl-70, RNP Antibody, Anti-Smith antibody, Ro, [place], Ds DNA, C3 and C4, Beta-2 GP1, acl, Lupus Anticoagulant Eval w/Reflex  Generalized anxiety disorder  Panic disorder  History of migraine - Followed by Dr. Domingo Cocking  History of IBS-patient has not seen a gastroenterologist in the past.  She has never had colonoscopy.  Family history of Crohn's disease - Brother  Family history of ulcerative colitis - Her father has inflammatory arthritis and ulcerative colitis.   Orders: Orders Placed This Encounter  Procedures   XR Hand 2 View Right   XR Hand 2 View Left   XR KNEE 3 VIEW RIGHT   XR KNEE 3 VIEW LEFT   XR Foot 2 Views Right   XR Foot 2 Views Left   UA w reflex microscopic   CK (Creatine Kinase)   CCP   14-3-3 eta Protein   HLA-B27   Scl-70   RNP Antibody   Anti-Smith antibody   Ro   La   Ds DNA   C3 and C4   Beta-2 GP1   acl   Lupus Anticoagulant Eval w/Reflex   G6PD   No orders of the defined types were placed in this encounter.   Face-to-face time spent with patient was 50 minutes. Greater than 50% of time was spent in counseling and coordination of care.  Follow-Up Instructions: Return for Polyarthralgia, positive ANA.   Bo Merino, MD  Note - This record has been created using Editor, commissioning.  Chart creation errors have been sought, but may not always  have been located. Such creation errors do not reflect on  the standard of medical care.

## 2019-01-26 ENCOUNTER — Encounter: Payer: Self-pay | Admitting: Rheumatology

## 2019-01-26 ENCOUNTER — Ambulatory Visit: Payer: Self-pay

## 2019-01-26 ENCOUNTER — Other Ambulatory Visit: Payer: Self-pay

## 2019-01-26 ENCOUNTER — Ambulatory Visit: Payer: BC Managed Care – PPO | Admitting: Rheumatology

## 2019-01-26 VITALS — BP 112/73 | HR 89 | Resp 14 | Ht 68.0 in | Wt 213.8 lb

## 2019-01-26 DIAGNOSIS — F410 Panic disorder [episodic paroxysmal anxiety] without agoraphobia: Secondary | ICD-10-CM | POA: Diagnosis not present

## 2019-01-26 DIAGNOSIS — M25562 Pain in left knee: Secondary | ICD-10-CM | POA: Diagnosis not present

## 2019-01-26 DIAGNOSIS — Z8379 Family history of other diseases of the digestive system: Secondary | ICD-10-CM

## 2019-01-26 DIAGNOSIS — M79671 Pain in right foot: Secondary | ICD-10-CM

## 2019-01-26 DIAGNOSIS — M2550 Pain in unspecified joint: Secondary | ICD-10-CM

## 2019-01-26 DIAGNOSIS — M25561 Pain in right knee: Secondary | ICD-10-CM

## 2019-01-26 DIAGNOSIS — R5383 Other fatigue: Secondary | ICD-10-CM

## 2019-01-26 DIAGNOSIS — M79641 Pain in right hand: Secondary | ICD-10-CM

## 2019-01-26 DIAGNOSIS — M79672 Pain in left foot: Secondary | ICD-10-CM

## 2019-01-26 DIAGNOSIS — R768 Other specified abnormal immunological findings in serum: Secondary | ICD-10-CM

## 2019-01-26 DIAGNOSIS — G8929 Other chronic pain: Secondary | ICD-10-CM

## 2019-01-26 DIAGNOSIS — Z8669 Personal history of other diseases of the nervous system and sense organs: Secondary | ICD-10-CM

## 2019-01-26 DIAGNOSIS — M79642 Pain in left hand: Secondary | ICD-10-CM | POA: Diagnosis not present

## 2019-01-26 DIAGNOSIS — F411 Generalized anxiety disorder: Secondary | ICD-10-CM

## 2019-01-26 DIAGNOSIS — Z8719 Personal history of other diseases of the digestive system: Secondary | ICD-10-CM

## 2019-01-27 NOTE — Telephone Encounter (Signed)
Please call patient to discuss approximately when we will be able to receive blood work.  Also let her know that if her blood work is abnormal we will schedule her sooner.  As regards to the joint pain she can use topical diclofenac gel or use Tylenol.  She may discuss with Dr. Domingo Cocking what options does she have for pain management if she cannot take Tylenol.

## 2019-01-27 NOTE — Telephone Encounter (Signed)
Patient advised it can take a week to a week and half before all labs have resulted.  Patient advised that if her blood work is abnormal we will schedule her sooner. Patient advised in regards to the joint pain she can use topical diclofenac gel or use Tylenol.  She may discuss with Dr. Domingo Cocking what options does she have for pain management if she cannot take Tylenol. Patient verbalized understanding.

## 2019-01-28 DIAGNOSIS — G43719 Chronic migraine without aura, intractable, without status migrainosus: Secondary | ICD-10-CM | POA: Diagnosis not present

## 2019-01-28 DIAGNOSIS — M542 Cervicalgia: Secondary | ICD-10-CM | POA: Diagnosis not present

## 2019-01-29 ENCOUNTER — Encounter: Payer: Self-pay | Admitting: Rheumatology

## 2019-01-30 LAB — BETA-2 GLYCOPROTEIN ANTIBODIES
Beta-2 Glyco 1 IgA: <9 SAU (ref <=20)
Beta-2 Glyco 1 IgM: 9 SMU (ref <=20)
Beta-2 Glyco I IgG: <9 SGU (ref <=20)

## 2019-01-30 LAB — URINALYSIS, ROUTINE W REFLEX MICROSCOPIC
Bacteria, UA: NONE SEEN /HPF
Bilirubin Urine: NEGATIVE
Glucose, UA: NEGATIVE
Hgb urine dipstick: NEGATIVE
Hyaline Cast: NONE SEEN /LPF
Ketones, ur: NEGATIVE
Nitrite: NEGATIVE
Protein, ur: NEGATIVE
Specific Gravity, Urine: 1.025 (ref 1.001–1.03)
pH: 8 (ref 5.0–8.0)

## 2019-01-30 LAB — CARDIOLIPIN ANTIBODIES, IGG, IGM, IGA
Anticardiolipin IgA: <11 [APL'U]
Anticardiolipin IgG: 42 [GPL'U] — ABNORMAL HIGH
Anticardiolipin IgM: <12 [MPL'U]

## 2019-01-30 LAB — LUPUS ANTICOAGULANT EVAL W/ REFLEX
PTT-LA Screen: 32 s (ref <=40)
dRVVT: 35 s (ref <=45)

## 2019-01-30 LAB — RNP ANTIBODY: Ribonucleic Protein(ENA) Antibody, IgG: <1 AI

## 2019-01-30 LAB — GLUCOSE 6 PHOSPHATE DEHYDROGENASE: G-6PDH: 13.9 U/g Hgb (ref 7.0–20.5)

## 2019-01-30 LAB — HLA-B27 ANTIGEN: HLA-B27 Antigen: NEGATIVE

## 2019-01-30 LAB — ANTI-SMITH ANTIBODY: ENA SM Ab Ser-aCnc: <1 AI

## 2019-01-30 LAB — ANTI-SCLERODERMA ANTIBODY: Scleroderma (Scl-70) (ENA) Antibody, IgG: 1.8 AI — AB

## 2019-01-30 LAB — CK: Total CK: 81 U/L (ref 29–143)

## 2019-01-30 LAB — C3 AND C4
C3 Complement: 115 mg/dL (ref 83–193)
C4 Complement: 36 mg/dL (ref 15–57)

## 2019-01-30 LAB — CYCLIC CITRUL PEPTIDE ANTIBODY, IGG: Cyclic Citrullin Peptide Ab: <16 UNITS

## 2019-01-30 LAB — SJOGRENS SYNDROME-B EXTRACTABLE NUCLEAR ANTIBODY: SSB (La) (ENA) Antibody, IgG: <1 AI

## 2019-01-30 LAB — 14-3-3 ETA PROTEIN: 14-3-3 eta Protein: <0.2 ng/mL (ref <0.2)

## 2019-01-30 LAB — SJOGRENS SYNDROME-A EXTRACTABLE NUCLEAR ANTIBODY: SSA (Ro) (ENA) Antibody, IgG: <1 AI

## 2019-01-30 LAB — ANTI-DNA ANTIBODY, DOUBLE-STRANDED: ds DNA Ab: 1 IU/mL

## 2019-02-01 NOTE — Progress Notes (Signed)
Office Visit Note  Patient: Jennifer Vincent             Date of Birth: [birth year]           MRN: [id number]             PCP: Caren Macadam, MD Referring: Caren Macadam, MD Visit Date: 02/03/2019 Occupation: '@GUAROCC'$ @  Subjective:  Pain in multiple joints.   History of Present Illness: Jennifer Vincent is a 28 y.o. female with a history of inflammatory arthritis.  She states she had episode of increased joint pain and swelling in her hands and her left ankle joint.  She had pictures on her iPhone today which we looked at.  She had erythema over her MCP joints.  She also had swelling over MCP joints and left ankle joint.  She states since last night she has been having discomfort in her bilateral elbow joints.  She also has discomfort in her bilateral knee joints.  Her father who is an Materials engineer at [employer] and her husband joined during the conversation as well.  She has been experiencing a lot of fatigue.  She denies any history of oral ulcers, nasal ulcers, malar rash, photosensitivity or Raynaud's phenomenon.  She has been experiencing increased headaches.  Activities of Daily Living:  Patient reports morning stiffness for 24 hours.   Patient Reports nocturnal pain.  Difficulty dressing/grooming: Denies Difficulty climbing stairs: Denies Difficulty getting out of chair: Denies Difficulty using hands for taps, buttons, cutlery, and/or writing: Reports  Review of Systems  Constitutional: Positive for fatigue.  HENT: Positive for mouth dryness. Negative for mouth sores and nose dryness.   Eyes: Positive for dryness. Negative for itching.  Respiratory: Negative for shortness of breath, wheezing and difficulty breathing.   Cardiovascular: Negative for chest pain and palpitations.  Gastrointestinal: Negative for blood in stool, constipation and diarrhea.  Endocrine: Negative for increased urination.  Genitourinary: Negative for difficulty urinating.  Musculoskeletal: Positive for  arthralgias, joint pain, joint swelling and morning stiffness.  Skin: Negative for color change and rash.  Allergic/Immunologic: Negative for susceptible to infections.  Neurological: Positive for headaches. Negative for dizziness, numbness, memory loss and weakness.  Hematological: Negative for bruising/bleeding tendency.  Psychiatric/Behavioral: Positive for sleep disturbance. Negative for depressed mood and confusion. The patient is nervous/anxious.     PMFS History:  Patient Active Problem List   Diagnosis Date Noted  . Pregnant 07/03/2018  . Generalized anxiety disorder 01/11/2018  . Panic disorder 10/04/2016  . Anxiety 10/04/2016    Past Medical History:  Diagnosis Date  . Allergic rhinitis   . Anxiety   . Frequent headaches   . IBS (irritable bowel syndrome)   . PCOS (polycystic ovarian syndrome)     Family History  Problem Relation Age of Onset  . Depression Mother   . Eating disorder Mother   . Arthritis Father   . Crohn's disease Father   . Arthritis Brother   . Alcohol abuse Brother   . Mental illness Brother   . Depression Brother   . Anxiety disorder Brother   . Crohn's disease Brother   . Mental illness Maternal Grandfather   . Anxiety disorder Maternal Grandfather   . OCD Maternal Grandfather   . Heart disease Paternal Grandfather   . Diabetes Paternal Grandfather    Past Surgical History:  Procedure Laterality Date  . 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 Influenza-Unspecified 12/16/2016, 12/25/2017, 12/16/2018  . PPD Test 10/13/2014     Objective: Vital Signs: BP (!) 127/91 (BP Location: Left Arm, Patient Position: Sitting, Cuff Size: Normal)   Pulse (!) 105   Resp 14   Ht 5'  8" (1.727 m)   Wt 215 lb (97.5 kg)   BMI 32.69 kg/m    Physical Exam Vitals signs and nursing note reviewed.  Constitutional:      Appearance: She is well-developed.  HENT:     Head: Normocephalic and atraumatic.  Eyes:     Conjunctiva/sclera: Conjunctivae normal.  Neck:     Musculoskeletal: Normal range of motion.  Cardiovascular:     Rate and Rhythm: Normal rate and regular rhythm.     Heart sounds: Normal heart sounds.  Pulmonary:     Effort: Pulmonary effort is normal.     Breath sounds: Normal breath sounds.  Abdominal:     General: Bowel sounds are normal.     Palpations: Abdomen is soft.  Lymphadenopathy:     Cervical: No cervical adenopathy.  Skin:    General: Skin is warm and dry.     Capillary Refill: Capillary refill takes less than 2 seconds.  Neurological:     Mental Status: She is alert and oriented to person, place, and time.  Psychiatric:        Behavior: Behavior normal.      Musculoskeletal Exam: C-spine was in good range of motion.  She has some trapezius tightness.  Shoulder joints, elbow joints, wrist joints were in good range of motion.  She had tenderness on palpation of bilateral elbow joints.  She had no synovitis over MCPs or PIPs.  She has mild erythema over PIPs.  Hip joints, knee joints, ankles, MTPs and PIPs in good range of motion with no synovitis.  She had painful range of motion of bilateral knee joints.  CDAI Exam: CDAI Score: - Patient Global: -; Provider Global: - Swollen: -; Tender: - Joint Exam   No joint exam has been documented for this visit   There is currently no information documented on the homunculus. Go to the Rheumatology activity and complete the homunculus joint exam.  Investigation: No additional findings.  Imaging: Xr Foot 2 Views Left  Result Date: 01/26/2019 No MTP, PIP or DIP narrowing was noted.  No intertarsal joint space narrowing was noted.  No erosive changes were noted. Impression: Unremarkable x-ray  of the foot.  Xr Foot 2 Views Right  Result Date: 01/26/2019 No MTP PIP or DIP narrowing was noted.  No intertarsal joint space narrowing was noted.  No erosive changes were noted. Impression: Unremarkable x-ray of the foot.  Xr Hand 2 View Left  Result Date: 01/26/2019 No MCP PIP or DIP narrowing was noted.  No intercarpal joint space narrowing was noted.  No erosive changes were noted. Impression: Unremarkable x-ray of the hand.  Xr Hand 2 View Right  Result Date: 01/26/2019 No MCP PIP or DIP narrowing was noted.  No intercarpal joint space narrowing was noted.  No erosive changes were noted. Impression: Unremarkable x-ray of the hand.  Xr Knee 3 View Left  Result Date: 01/26/2019 No medial lateral c ompartment narrowing was noted.  Mild patellofemoral narrowing was noted.  No chondrocalcinosis was noted. Impression: Mild  chondromalacia patella of the knee.  Xr Knee 3 View Right  Result Date: 01/26/2019 No medial lateral c ompartment narrowing was noted.  Mild patellofemoral narrowing was noted.  No chondrocalcinosis was noted. Impression: Mild chondromalacia patella of the knee.   Recent Labs: Lab Results  Component Value Date   WBC 5.5 01/18/2019   HGB 14.8 01/18/2019   PLT 247.0 01/18/2019   NA 140 01/18/2019   K 3.7 01/18/2019   CL 103 01/18/2019   CO2 28 01/18/2019   GLUCOSE 95 01/18/2019   BUN 9 01/18/2019   CREATININE 0.68 01/18/2019   BILITOT 0.4 01/18/2019   ALKPHOS 54 01/18/2019   AST 14 01/18/2019   ALT 10 01/18/2019   PROT 7.2 01/18/2019   ALBUMIN 4.7 01/18/2019   CALCIUM 9.6 01/18/2019   January 18, 2019 ANA 1: 320 nuclear speckled  January 26, 2019 ENA negative except SCL 70+, C3-C4 normal, anticardiolipin IgG 42, beta-2 GP 1 -, lupus anticoagulant negative, CK 81, anti-CCP negative, '14 3 3 '$ eta negative, HLA-B27 negative  Speciality Comments: No specialty comments available.  Procedures:  No procedures performed Allergies: Lactose intolerance  (gi)   Assessment / Plan:     Visit Diagnoses: Autoimmune disease ([place]) - Positive ANA, positive SCL 70, aCL 42, fatigue, inflammatory arthritis (pictures noted on the cell phone showed MCP, left ankle joint swelling and rash was noted over her MCPs).  All other autoimmune work-up was negative.  I had detailed discussion with the patient.  Her father and husband also joined [place] on her cell phone.  We had detailed discussion regarding the clinical findings.  After detailed discussion we discussed the use of Plaquenil.  I believe it to be very helpful to decrease her symptoms.  Indications side effects contraindications were discussed.  The plan is to start her on Plaquenil 200 mg p.o. twice daily.  She will need baseline eye examination and then yearly eye examination.  At this point she does not have any clinical features of scleroderma.  She has positive anticardiolipin antibody.  I will repeat anticardiolipin antibody in 3 months.  I have advised her to increase fluid intake.  Polyarthralgia - History of joint pain for many years.  The pain has been worse in the last 2 months.  She is 6 months postpartum.  X-rays of bilateral hands and bilateral feet at the last visit were negative.  Pain of both elbows-she has been experiencing increased elbow joint pain in the last 24 hours.  She had tenderness on palpation.  High risk medication use-we will check labs which will include CBC, CMP in 1 month and then will obtain CBC, CMP, anticardiolipin, C3-C4 in 3 months.  Patient is a starting progesterone oral contraceptive per her GYN for headaches.  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  She had discomfort range of motion of her bilateral knee joints.  No warmth swelling or effusion was noted.  Panic disorder  Generalized anxiety disorder  History of migraine - Followed by Dr. Domingo Cocking.  History of IBS - Patient never had colonoscopy although she has seen a gastroenterologist in the past.   Family history of Crohn's disease - And her brother.  Family history of ulcerative colitis - Her father has inflammatory arthritis and ulcerative colitis.  Orders: Orders Placed This Encounter  Procedures  . CBC with Differential/Platelet  . COMPLETE METABOLIC PANEL WITH GFR   Meds ordered this encounter  Medications  . hydroxychloroquine (PLAQUENIL) 200 MG tablet    Sig: Take  1 tablet (200 mg total) by mouth 2 (two) times daily.    Dispense:  180 tablet    Refill:  0    Face-to-face time spent with patient was 40 minutes. Greater than 50% of time was spent in counseling and coordination of care.  Follow-Up Instructions: Return in about 4 weeks (around 03/03/2019) for Autoimmune disease.   Bo Merino, MD  Note - This record has been created using Editor, commissioning.  Chart creation errors have been sought, but may not always  have been located. Such creation errors do not reflect on  the standard of medical care.

## 2019-02-03 ENCOUNTER — Encounter: Payer: Self-pay | Admitting: Rheumatology

## 2019-02-03 ENCOUNTER — Other Ambulatory Visit: Payer: Self-pay

## 2019-02-03 ENCOUNTER — Ambulatory Visit: Payer: BC Managed Care – PPO | Admitting: Rheumatology

## 2019-02-03 ENCOUNTER — Telehealth: Payer: Self-pay | Admitting: Pharmacist

## 2019-02-03 ENCOUNTER — Telehealth: Payer: Self-pay | Admitting: Psychiatry

## 2019-02-03 VITALS — BP 127/91 | HR 105 | Resp 14 | Ht 68.0 in | Wt 215.0 lb

## 2019-02-03 DIAGNOSIS — F410 Panic disorder [episodic paroxysmal anxiety] without agoraphobia: Secondary | ICD-10-CM

## 2019-02-03 DIAGNOSIS — Z79899 Other long term (current) drug therapy: Secondary | ICD-10-CM

## 2019-02-03 DIAGNOSIS — M359 Systemic involvement of connective tissue, unspecified: Secondary | ICD-10-CM

## 2019-02-03 DIAGNOSIS — M25522 Pain in left elbow: Secondary | ICD-10-CM

## 2019-02-03 DIAGNOSIS — M25521 Pain in right elbow: Secondary | ICD-10-CM | POA: Diagnosis not present

## 2019-02-03 DIAGNOSIS — M2241 Chondromalacia patellae, right knee: Secondary | ICD-10-CM | POA: Diagnosis not present

## 2019-02-03 DIAGNOSIS — F411 Generalized anxiety disorder: Secondary | ICD-10-CM

## 2019-02-03 DIAGNOSIS — Z8669 Personal history of other diseases of the nervous system and sense organs: Secondary | ICD-10-CM

## 2019-02-03 DIAGNOSIS — M2550 Pain in unspecified joint: Secondary | ICD-10-CM

## 2019-02-03 DIAGNOSIS — Z8719 Personal history of other diseases of the digestive system: Secondary | ICD-10-CM

## 2019-02-03 DIAGNOSIS — M2242 Chondromalacia patellae, left knee: Secondary | ICD-10-CM

## 2019-02-03 DIAGNOSIS — Z8379 Family history of other diseases of the digestive system: Secondary | ICD-10-CM

## 2019-02-03 MED ORDER — HYDROXYCHLOROQUINE SULFATE 200 MG PO TABS: 200.0000 mg | ORAL_TABLET | Freq: Two times a day (BID) | ORAL | 0 refills | Status: DC

## 2019-02-03 MED ORDER — BUSPIRONE HCL 15 MG PO TABS: 30.0000 mg | ORAL_TABLET | Freq: Two times a day (BID) | ORAL | 0 refills | Status: DC

## 2019-02-03 NOTE — Telephone Encounter (Signed)
Updated Rx to Buspar 15 mg 2 tablets bid #360

## 2019-02-03 NOTE — Patient Instructions (Signed)
Standing Labs We placed an order today for your standing lab work.    Please come back and get your standing labs in 1 month, 3 months, and then every 5 months.  We have open lab daily Monday through Thursday from 8:30-12:30 PM and 1:30-4:30 PM and Friday from 8:30-12:30 PM and 1:30-4:00 PM at the office of Dr. Shaili Deveshwar.   You may experience shorter wait times on Monday and Friday afternoons. The office is located at [address], [address], [place], [place] [address] No appointment is necessary.   Labs are drawn by [facility].  You may receive a bill from [facility] for your lab work.  If you wish to have your labs drawn at another location, please call the office 24 hours in advance to send orders.  If you have any questions regarding directions or hours of operation,  please call [phone].   Just as a reminder please drink plenty of water prior to coming for your lab work. Thanks!  Vaccines You are taking a medication(s) that can suppress your immune system.  The following immunizations are recommended: . Flu annually . Pneumonia (Pneumovax 23 and Prevnar 13 spaced at least 1 year apart) . Shingrix  Please check with your PCP to make sure you are up to date.   Hydroxychloroquine tablets What is this medicine? HYDROXYCHLOROQUINE (hye drox ee KLOR oh kwin) is used to treat rheumatoid arthritis and systemic lupus erythematosus. It is also used to treat malaria. This medicine may be used for other purposes; ask your health care provider or pharmacist if you have questions. COMMON BRAND NAME(S): Plaquenil, Quineprox What should I tell my health care provider before I take this medicine? They need to know if you have any of these conditions:  diabetes  eye disease, vision problems  G6PD deficiency  heart disease  history of irregular heartbeat  if you often drink alcohol  kidney disease  liver disease  porphyria  psoriasis  an unusual or allergic reaction  to chloroquine, hydroxychloroquine, other medicines, foods, dyes, or preservatives  pregnant or trying to get pregnant  breast-feeding How should I use this medicine? Take this medicine by mouth with a glass of water. Follow the directions on the prescription label. Do not cut, crush or chew this medicine. Swallow the tablets whole. Take this medicine with food. Avoid taking antacids within 4 hours of taking this medicine. It is best to separate these medicines by at least 4 hours. Take your medicine at regular intervals. Do not take it more often than directed. Take all of your medicine as directed even if you think you are better. Do not skip doses or stop your medicine early. Talk to your pediatrician regarding the use of this medicine in children. While this drug may be prescribed for selected conditions, precautions do apply. Overdosage: If you think you have taken too much of this medicine contact a poison control center or emergency room at once. NOTE: This medicine is only for you. Do not share this medicine with others. What if I miss a dose? If you miss a dose, take it as soon as you can. If it is almost time for your next dose, take only that dose. Do not take double or extra doses. What may interact with this medicine? Do not take this medicine with any of the following medications:  cisapride  dronedarone  pimozide  thioridazine This medicine may also interact with the following medications:  ampicillin  antacids  cimetidine  cyclosporine    digoxin  kaolin  medicines for diabetes, like insulin, glipizide, glyburide  medicines for seizures like carbamazepine, phenobarbital, phenytoin  mefloquine  methotrexate  other medicines that prolong the QT interval (cause an abnormal heart rhythm)  praziquantel This list may not describe all possible interactions. Give your health care provider a list of all the medicines, herbs, non-prescription drugs, or dietary  supplements you use. Also tell them if you smoke, drink alcohol, or use illegal drugs. Some items may interact with your medicine. What should I watch for while using this medicine? Visit your health care professional for regular checks on your progress. Tell your health care professional if your symptoms do not start to get better or if they get worse. You may need blood work done while you are taking this medicine. If you take other medicines that can affect heart rhythm, you may need more testing. Talk to your health care professional if you have questions. Your vision may be tested before and during use of this medicine. Tell your health care professional right away if you have any change in your eyesight. What side effects may I notice from receiving this medicine? Side effects that you should report to your doctor or health care professional as soon as possible:  allergic reactions like skin rash, itching or hives, swelling of the face, lips, or tongue  changes in vision  decreased hearing or ringing of the ears  muscle weakness  redness, blistering, peeling or loosening of the skin, including inside the mouth  sensitivity to light  signs and symptoms of a dangerous change in heartbeat or heart rhythm like chest pain; dizziness; fast or irregular heartbeat; palpitations; feeling faint or lightheaded, falls; breathing problems  signs and symptoms of liver injury like dark yellow or brown urine; general ill feeling or flu-like symptoms; light-colored stools; loss of appetite; nausea; right upper belly pain; unusually weak or tired; yellowing of the eyes or skin  signs and symptoms of low blood sugar such as feeling anxious; confusion; dizziness; increased hunger; unusually weak or tired; sweating; shakiness; cold; irritable; headache; blurred vision; fast heartbeat; loss of consciousness  suicidal thoughts  uncontrollable head, mouth, neck, arm, or leg movements Side effects that  usually do not require medical attention (report to your doctor or health care professional if they continue or are bothersome):  diarrhea  dizziness  hair loss  headache  irritable  loss of appetite  nausea, vomiting  stomach pain This list may not describe all possible side effects. Call your doctor for medical advice about side effects. You may report side effects to FDA at 1-800-FDA-1088. Where should I keep my medicine? Keep out of the reach of children. Store at room temperature between 15 and 30 degrees C (59 and 86 degrees F). Protect from moisture and light. Throw away any unused medicine after the expiration date. NOTE: This sheet is a summary. It may not cover all possible information. If you have questions about this medicine, talk to your doctor, pharmacist, or health care provider.  2020 Elsevier/Gold Standard (2018-07-13 12:56:32)   

## 2019-02-03 NOTE — Progress Notes (Signed)
Pharmacy Note  Subjective: Patient presents today to [facility] for follow up office visit.   Patient seen by the pharmacist for counseling on hydroxychloroquine autoimmune disease.  She is naive to therapy.  Objective: CMP     Component Value Date/Time   NA 140 01/18/2019 1226   K 3.7 01/18/2019 1226   CL 103 01/18/2019 1226   CO2 28 01/18/2019 1226   GLUCOSE 95 01/18/2019 1226   BUN 9 01/18/2019 1226   CREATININE 0.68 01/18/2019 1226   CALCIUM 9.6 01/18/2019 1226   PROT 7.2 01/18/2019 1226   ALBUMIN 4.7 01/18/2019 1226   AST 14 01/18/2019 1226   ALT 10 01/18/2019 1226   ALKPHOS 54 01/18/2019 1226   BILITOT 0.4 01/18/2019 1226    CBC    Component Value Date/Time   WBC 5.5 01/18/2019 1226   RBC 4.78 01/18/2019 1226   HGB 14.8 01/18/2019 1226   HCT 44.0 01/18/2019 1226   PLT 247.0 01/18/2019 1226   MCV 92.0 01/18/2019 1226   MCH 30.7 07/04/2018 0451   MCHC 33.6 01/18/2019 1226   RDW 12.5 01/18/2019 1226   LYMPHSABS 1.5 01/18/2019 1226   MONOABS 0.3 01/18/2019 1226   EOSABS 0.0 01/18/2019 1226   BASOSABS 0.0 01/18/2019 1226    Assessment/Plan: Patient was counseled on the purpose, proper use, and adverse effects of hydroxychloroquine including nausea/diarrhea, skin rash, headaches, and sun sensitivity.  Discussed importance of annual eye exams while on hydroxychloroquine to monitor to ocular toxicity and discussed importance of frequent laboratory monitoring.  Provided patient with eye exam form for baseline ophthalmologic exam and standing lab instructions.  Provided patient with educational materials on hydroxychloroquine and answered all questions.  Patient consented to hydroxychloroquine.  Will upload consent in the media tab.     Dose will be Plaquenil 200 mg twice daily based on weight of 97.5 kg and height of 5'8".  Prescription sent to local pharmacy.  All questions encouraged and answered.  Instructed patient to call with any other questions or  concerns.   Mariella Saa, PharmD, [place], [place] Clinical Specialty Pharmacist [phone]  02/03/2019 3:29 PM

## 2019-02-03 NOTE — Telephone Encounter (Signed)
Jennifer Vincent called because she is trying to get a refill of her buspar but the pharmacy has an old prescription with wrong dosing.  She needs a prescription for 15mg  BID. [facility] [address].

## 2019-02-03 NOTE — Telephone Encounter (Signed)
[facility]. Submitted a Prior Authorization request to [facility] for Plaquenil via Cover My Meds. Will update once we receive a response.  Mariella Saa, PharmD, [place], [place] Clinical Specialty Pharmacist [phone]  02/03/2019 3:48 PM

## 2019-02-04 ENCOUNTER — Encounter: Payer: Self-pay | Admitting: Rheumatology

## 2019-02-04 NOTE — Telephone Encounter (Signed)
Received notification from [facility] regarding a prior authorization for Plaquenil. Authorization has been APPROVED from 02/03/2019 to 02/01/2022.   Will send document to scan center.  Authorization # [id number]   Mariella Saa, PharmD, [place], [facility] Clinical Specialty Pharmacist [phone]  02/04/2019 12:18 PM

## 2019-02-04 NOTE — Telephone Encounter (Signed)
Janett Billow from [facility] left a voicemail stating she got the PA approved for patient's Plaquenil, however there is a drug interaction and wanted to make sure you are aware and okay to dispense.  Please call back at [phone]

## 2019-02-04 NOTE — Telephone Encounter (Signed)
Returned call and spoke with pharmacist Janett Billow.  She states patient had prescription for chlorpromazine and interacts with Plaquenil.  She states patient said she rarely takes and will stop taking.  Patient has no underlying heart conditions and last EKG in 2018 had normal QTC prolongation.  Gave permission to fill.   Mariella Saa, PharmD, [place], [place] Clinical Specialty Pharmacist [phone]  02/04/2019 3:49 PM

## 2019-02-04 NOTE — Telephone Encounter (Signed)
[facility] requesting a call back today in reference to drug interaction request. Patient wants to pick up medication today. Please call pharmacy.

## 2019-02-08 ENCOUNTER — Other Ambulatory Visit: Payer: Self-pay

## 2019-02-08 ENCOUNTER — Telehealth (INDEPENDENT_AMBULATORY_CARE_PROVIDER_SITE_OTHER): Payer: BC Managed Care – PPO | Admitting: Family Medicine

## 2019-02-08 DIAGNOSIS — Z20822 Contact with and (suspected) exposure to covid-19: Secondary | ICD-10-CM

## 2019-02-08 DIAGNOSIS — Z7189 Other specified counseling: Secondary | ICD-10-CM | POA: Diagnosis not present

## 2019-02-08 DIAGNOSIS — J069 Acute upper respiratory infection, unspecified: Secondary | ICD-10-CM

## 2019-02-08 NOTE — Progress Notes (Signed)
Virtual Visit via Video Note  I connected with Jennifer Vincent on 02/08/19 at  9:00 AM EST by a video enabled telemedicine application 2/2 COVID-19 pandemic and verified that I am speaking with the correct person using two identifiers.  Location patient: home Location provider:work or home office Persons participating in the virtual visit: patient, provider  I discussed the limitations of evaluation and management by telemedicine and the availability of in person appointments. The patient expressed understanding and agreed to proceed.   HPI: Pt is a 28 yo with pmh sig for migraines, seasonal allergies, GAD who is followed by Dr. Hassan Rowan.  Pt seen for acute concern of progressively worsening sore throat starting on Saturday.  Started as a tickle, now sore.  Pt also feeling congested. Denies fever, cough, ear pain/pressure, facial pain/pressure, diarrhea, loss of taste or smell, HAs.  Tried ibuprofen, succret cough.  Taking zyrtec for allergies-states this feels different. 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  Pt concerned about getting her daughter sick.  Not breast feeding.   ROS: See pertinent positives and negatives per HPI.  Past Medical History:  Diagnosis Date  . Allergic rhinitis   . Anxiety   . Frequent headaches   . IBS (irritable bowel syndrome)   . PCOS (polycystic ovarian syndrome)     Past Surgical History:  Procedure Laterality Date  . TONSILLECTOMY      Family History  Problem Relation Age of Onset  . Depression Mother   . Eating disorder Mother   . Arthritis Father   . Crohn's disease Father   . Arthritis Brother   . Alcohol abuse Brother   . Mental illness Brother   . Depression Brother   . Anxiety disorder Brother   . Crohn's disease Brother   . Mental illness Maternal Grandfather   . Anxiety disorder Maternal Grandfather   . OCD Maternal Grandfather   . Heart disease Paternal Grandfather   . Diabetes Paternal Grandfather     Current  Outpatient Medications:  .  ALPRAZolam (XANAX) 0.5 MG tablet, Take 1 tablet (0.5 mg total) by mouth 2 (two) times daily as needed for anxiety., Disp: 60 tablet, Rfl: 1 .  baclofen (LIORESAL) 10 MG tablet, as needed., Disp: , Rfl:  .  busPIRone (BUSPAR) 15 MG tablet, Take 2 tablets (30 mg total) by mouth 2 (two) times daily., Disp: 360 tablet, Rfl: 0 .  hydroxychloroquine (PLAQUENIL) 200 MG tablet, Take 1 tablet (200 mg total) by mouth 2 (two) times daily., Disp: 180 tablet, Rfl: 0 .  sertraline (ZOLOFT) 100 MG tablet, Take 1 tablet (100 mg total) by mouth daily., Disp: 90 tablet, Rfl: 1 .  zonisamide (ZONEGRAN) 25 MG capsule, , Disp: , Rfl:   EXAM:  VITALS per patient if applicable: RR between 12-20 bpm  GENERAL: alert, oriented, appears well and in no acute distress  HEENT: atraumatic, conjunctiva clear, no obvious abnormalities on inspection of external nose and ears.  Pharynx with mild erythema.  No edema or exudate noted.  NECK: normal movements of the head and neck  LUNGS: on inspection no signs of respiratory distress, breathing rate appears normal, no obvious gross SOB, gasping or wheezing  CV: no obvious cyanosis  MS: moves all visible extremities without noticeable abnormality  PSYCH/NEURO: pleasant and cooperative, no obvious depression or anxiety, speech and thought processing grossly intact  ASSESSMENT AND PLAN:  Discussed the following assessment and plan:  Viral URI  -Discussed various causes of sore  throat and congestion including viral etiology. -supportive care -discussed frequent handwashing.  Consider wearing mask around daughter if unable to socially distance -consider COVID testing for worsened or continued symptoms -given precautions  Educated about COVID-19 infection -Discussed s/s -Consider Covid testing for continued or worsening symptoms  Follow-up as needed   I discussed the assessment and treatment plan with the patient. The patient was provided  an opportunity to ask questions and all were answered. The patient agreed with the plan and demonstrated an understanding of the instructions.   The patient was advised to call back or seek an in-person evaluation if the symptoms worsen or if the condition fails to improve as anticipated.   [name], MD

## 2019-02-10 ENCOUNTER — Encounter: Payer: Self-pay | Admitting: Family Medicine

## 2019-02-10 ENCOUNTER — Other Ambulatory Visit: Payer: Self-pay | Admitting: Family Medicine

## 2019-02-10 ENCOUNTER — Telehealth: Payer: Self-pay | Admitting: Family Medicine

## 2019-02-10 ENCOUNTER — Other Ambulatory Visit: Payer: Self-pay | Admitting: Internal Medicine

## 2019-02-10 DIAGNOSIS — G43011 Migraine without aura, intractable, with status migrainosus: Secondary | ICD-10-CM

## 2019-02-10 LAB — NOVEL CORONAVIRUS, NAA: SARS-CoV-2, NAA: NOT DETECTED

## 2019-02-10 MED ORDER — RIZATRIPTAN BENZOATE 10 MG PO TABS: 10.0000 mg | ORAL_TABLET | ORAL | 2 refills | Status: DC | PRN

## 2019-02-10 MED ORDER — SUMATRIPTAN SUCCINATE 100 MG PO TABS: 100.0000 mg | ORAL_TABLET | Freq: Once | ORAL | 2 refills | Status: DC | PRN

## 2019-02-10 NOTE — Telephone Encounter (Signed)
Copied from [place] [id number]. Topic: General - Other >> Feb 10, 2019  9:38 AM Keene Breath wrote: Reason for CRM: Patient called to ask if the doctor could send in some medication for her migraine headaches.  CB# [phone]

## 2019-02-10 NOTE — Telephone Encounter (Signed)
See mychart message from the pt with symptoms.

## 2019-02-10 NOTE — Telephone Encounter (Signed)
What has she tried so far? Can she give more details of migraine?   I know previously sumatriptan didn't work for her. Has she tried maxalt? Does she know dose of sumatriptan? Has she tried relpax?

## 2019-02-10 NOTE — Telephone Encounter (Signed)
Addressing through [employer]

## 2019-02-15 DIAGNOSIS — G43719 Chronic migraine without aura, intractable, without status migrainosus: Secondary | ICD-10-CM | POA: Diagnosis not present

## 2019-02-15 DIAGNOSIS — M542 Cervicalgia: Secondary | ICD-10-CM | POA: Diagnosis not present

## 2019-02-16 ENCOUNTER — Telehealth: Payer: BC Managed Care – PPO | Admitting: Nurse Practitioner

## 2019-02-16 DIAGNOSIS — R05 Cough: Secondary | ICD-10-CM | POA: Diagnosis not present

## 2019-02-16 DIAGNOSIS — R059 Cough, unspecified: Secondary | ICD-10-CM

## 2019-02-16 MED ORDER — BENZONATATE 100 MG PO CAPS: 100.0000 mg | ORAL_CAPSULE | Freq: Three times a day (TID) | ORAL | 0 refills | Status: DC | PRN

## 2019-02-16 NOTE — Progress Notes (Signed)
We are sorry that you are not feeling well.  Here is how we plan to help!  Based on your presentation I believe you most likely have A cough due to a virus.  This is called viral bronchitis and is best treated by rest, plenty of fluids and control of the cough.  You may use Ibuprofen or Tylenol as directed to help your symptoms.     In addition you may use A prescription cough medication called Tessalon Perles 100mg . You may take 1-2 capsules every 8 hours as needed for your cough.  If you develop a fver- may want to be tested for covid. Below is testing infromation. You can go to one of the  testing sites listed below, while they are opened (see hours). You do not need a doctors order to be tested for covid.You do need to self-isolate until your results return and if positive 14 days from when your symptoms started and until you are 3 days symptom free.   Testing Locations (Monday - Friday, 10a.m. - 3:30 p.m.)   [place]: [facility] Entrance), [address] [place], [place]: [address], [place], [place], [place] (entrance off [address] (Closed each Monday): [address] [place], [place] - the short stay covered drive at [facility] (Use the [employer] entrance to [facility] next to [facility].) -    From your responses in the [place] you describe inflammation in the upper respiratory tract which is causing a significant cough.  This is commonly called Bronchitis and has four common causes:    Allergies  Viral Infections  Acid Reflux  Bacterial Infection Allergies, viruses and acid reflux are treated by controlling symptoms or eliminating the cause. An example might be a cough caused by taking certain blood pressure medications. You stop the cough by changing the medication. Another example might be a cough caused by  acid reflux. Controlling the reflux helps control the cough.  USE OF BRONCHODILATOR ("RESCUE") INHALERS: There is a risk from using your bronchodilator too frequently.  The risk is that over-reliance on a medication which only relaxes the muscles surrounding the breathing tubes can reduce the effectiveness of medications prescribed to reduce swelling and congestion of the tubes themselves.  Although you feel brief relief from the bronchodilator inhaler, your asthma may actually be worsening with the tubes becoming more swollen and filled with mucus.  This can delay other crucial treatments, such as oral steroid medications. If you need to use a bronchodilator inhaler daily, several times per day, you should discuss this with your provider.  There are probably better treatments that could be used to keep your asthma under control.     HOME CARE . Only take medications as instructed by your medical team. . Complete the entire course of an antibiotic. . Drink plenty of fluids and get plenty of rest. . Avoid close contacts especially the very young and the elderly . Cover your mouth if you cough or cough into your sleeve. . Always remember to wash your hands . A steam or ultrasonic humidifier can help congestion.   GET HELP RIGHT AWAY IF: . You develop worsening fever. . You become short of breath . You cough up blood. . Your symptoms persist after you have completed your treatment plan MAKE SURE YOU   Understand these instructions.  Will watch your  condition.  Will get help right away if you are not doing well or get worse.  Your e-visit answers were reviewed by a board certified advanced clinical practitioner to complete your personal care plan.  Depending on the condition, your plan could have included both over the counter or prescription medications. If there is a problem please reply  once you have received a response from your provider. Your safety is important to [place].  If you have  drug allergies check your prescription carefully.    You can use MyChart to ask questions about today's visit, request a non-urgent call back, or ask for a work or school excuse for 24 hours related to this e-Visit. If it has been greater than 24 hours you will need to follow up with your provider, or enter a new e-Visit to address those concerns. You will get an e-mail in the next two days asking about your experience.  I hope that your e-visit has been valuable and will speed your recovery. Thank you for using e-visits.  5-10 minutes spent reviewing and documenting in chart.

## 2019-02-17 ENCOUNTER — Other Ambulatory Visit: Payer: Self-pay

## 2019-02-17 DIAGNOSIS — Z20822 Contact with and (suspected) exposure to covid-19: Secondary | ICD-10-CM

## 2019-02-20 LAB — NOVEL CORONAVIRUS, NAA: SARS-CoV-2, NAA: NOT DETECTED

## 2019-02-22 ENCOUNTER — Ambulatory Visit: Payer: BC Managed Care – PPO | Admitting: Rheumatology

## 2019-03-01 DIAGNOSIS — M542 Cervicalgia: Secondary | ICD-10-CM | POA: Diagnosis not present

## 2019-03-01 DIAGNOSIS — Z79899 Other long term (current) drug therapy: Secondary | ICD-10-CM | POA: Diagnosis not present

## 2019-03-01 DIAGNOSIS — G43719 Chronic migraine without aura, intractable, without status migrainosus: Secondary | ICD-10-CM | POA: Diagnosis not present

## 2019-03-02 ENCOUNTER — Ambulatory Visit: Payer: BC Managed Care – PPO | Admitting: Rheumatology

## 2019-03-02 NOTE — Progress Notes (Signed)
Virtual Visit via Video Note  I connected with Jennifer Vincent on 03/03/19 at 11:00 AM EST by a video enabled telemedicine application and verified that I am speaking with the correct person using two identifiers.  Location: Patient: Home Provider: Clinic  This service was conducted via virtual visit.  Both audio and visual tools were used.  The patient was located at home. I was located in my office.  Consent was obtained prior to the virtual visit and is aware of possible charges through their insurance for this visit.  The patient is an established patient.  Dr. Corliss Skains, MD conducted the virtual visit and Sherron Ales, PA-C acted as scribe during the service.  Office staff helped with scheduling follow up visits after the service was conducted.     I discussed the limitations of evaluation and management by telemedicine and the availability of in person appointments. The patient expressed understanding and agreed to proceed.  CC: Medication monitoring  History of Present Illness: Patient is a 28 year old female with a past medical history of autoimmune disease.  She is taking plaquenil 200 mg 1 tablet by mouth twice daily.  She is tolerating PLQ without any side effects.  She has been taking PLQ for the past 3 weeks.  She has noticed mild improvement in her level of fatigue. She has persistent arthralgias in multiple joints.  She has mild inflammation in both hands and difficulty making a complete fist. She has been seeing Dr. Neale Burly for management of frequent migraines.   Review of Systems  Constitutional: Positive for malaise/fatigue. Negative for fever.  HENT:       Denies oral or nasal ulcers Denies mouth dryness   Eyes: Negative for photophobia, pain, discharge and redness.       +Dry eyes  Respiratory: Negative for cough, shortness of breath and wheezing.   Cardiovascular: Negative for chest pain and palpitations.  Gastrointestinal: Negative for blood in stool, constipation and  diarrhea.  Genitourinary: Negative for dysuria.  Musculoskeletal: Positive for joint pain. Negative for back pain, myalgias and neck pain.       +Morning stiffness  +Joint swelling   Skin: Positive for rash.  Neurological: Positive for headaches. Negative for dizziness.  Psychiatric/Behavioral: Negative for depression. The patient is nervous/anxious. The patient does not have insomnia.       Observations/Objective: Physical Exam  Constitutional: She is oriented to person, place, and time and well-developed, well-nourished, and in no distress.  HENT:  Head: Normocephalic and atraumatic.  Eyes: Conjunctivae are normal.  Pulmonary/Chest: Effort normal.  Neurological: She is alert and oriented to person, place, and time.  Psychiatric: Mood, memory, affect and judgment normal.   Patient reports morning stiffness for 10 minutes.   Patient denies nocturnal pain.  Difficulty dressing/grooming: Denies Difficulty climbing stairs: Denies Difficulty getting out of chair: Denies Difficulty using hands for taps, buttons, cutlery, and/or writing: Reports   Assessment and Plan: Visit Diagnoses: Autoimmune disease (HCC) - Positive ANA, positive SCL 70, aCL 42, fatigue, inflammatory arthritis (pictures noted on the cell phone showed MCP, left ankle joint swelling and rash was noted over her MCPs).  All other autoimmune work-up was negative: She started on Plaquenil 200 mg 1 tablet by mouth twice daily 3 weeks ago. She has been tolerating PLQ without any side effects.  She is having persistent pain and intermittent inflammation in both hands and both elbow joints.  She has not noticed improvement in her joint pain or inflammation since starting on PLQ.  She has noticed mild improvement in her level of fatigue.  She has ongoing eye dryness and uses eye drops twice daily.  She continues to have hair loss but is unsure if it is related to being postpartum.  She has not had any oral or nasal ulcerations.  She  has not had any symptoms of Raynaud's recently.  She has facial erythema intermittently. She will continue to take plaquenil 200 mg 1 tablet twice daily M-F.  She does not need any refills at this time.  She was advised to notify us if she develops any new or worsening symptoms.  She will follow up in 3 months.  High risk medication use: Plaquenil 200 mg 1 tablet by mouth twice daily. Baseline eye exam on 03/01/19.  She will have the results sent to our office.  She will require lab work 1 month after starting on PLQ then in 3 months then every 5 months. Standing orders are in place.   Positive anticardiolipin antibody-plan to repeat antibody titer in 3 months.  Polyarthralgia - History of joint pain for many years.   She is 8 months postpartum.  X-rays of bilateral hands and bilateral feet at the last visit were negative.  She has ongoing pain and inflammation in both hands and both elbow joints.    Pain of both elbows-She has ongoing pain and intermittent inflammation in both elbow joints.   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   Other medical conditions are listed as follows:  Panic disorder  Generalized anxiety disorder  History of migraine - Followed by Dr. Domingo Cocking.  History of IBS - Patient never had colonoscopy although she has seen a gastroenterologist in the past.  Family history of Crohn's disease - And her brother.  Family history of ulcerative colitis - Her father has inflammatory arthritis and ulcerative colitis.  Follow Up Instructions: She will follow up in 3 months.    I discussed the assessment and treatment plan with the patient. The patient was provided an opportunity to ask questions and all were answered. The patient agreed with the plan and demonstrated an understanding of the instructions.   The patient was advised to call back or seek an in-person evaluation if the symptoms worsen or if the condition fails to improve as anticipated.  I  provided 25 minutes of non-face-to-face time during this encounter.   Bo Merino, MD   Scribed by-  Hazel Sams, PA-C

## 2019-03-03 ENCOUNTER — Encounter: Payer: Self-pay | Admitting: Rheumatology

## 2019-03-03 ENCOUNTER — Other Ambulatory Visit: Payer: Self-pay

## 2019-03-03 ENCOUNTER — Telehealth (INDEPENDENT_AMBULATORY_CARE_PROVIDER_SITE_OTHER): Payer: BC Managed Care – PPO | Admitting: Rheumatology

## 2019-03-03 DIAGNOSIS — F410 Panic disorder [episodic paroxysmal anxiety] without agoraphobia: Secondary | ICD-10-CM

## 2019-03-03 DIAGNOSIS — F411 Generalized anxiety disorder: Secondary | ICD-10-CM

## 2019-03-03 DIAGNOSIS — M2550 Pain in unspecified joint: Secondary | ICD-10-CM

## 2019-03-03 DIAGNOSIS — M359 Systemic involvement of connective tissue, unspecified: Secondary | ICD-10-CM | POA: Diagnosis not present

## 2019-03-03 DIAGNOSIS — Z8669 Personal history of other diseases of the nervous system and sense organs: Secondary | ICD-10-CM

## 2019-03-03 DIAGNOSIS — Z8719 Personal history of other diseases of the digestive system: Secondary | ICD-10-CM

## 2019-03-03 DIAGNOSIS — M25521 Pain in right elbow: Secondary | ICD-10-CM

## 2019-03-03 DIAGNOSIS — Z8379 Family history of other diseases of the digestive system: Secondary | ICD-10-CM

## 2019-03-03 DIAGNOSIS — Z79899 Other long term (current) drug therapy: Secondary | ICD-10-CM | POA: Diagnosis not present

## 2019-03-03 DIAGNOSIS — M25522 Pain in left elbow: Secondary | ICD-10-CM

## 2019-03-03 DIAGNOSIS — M2241 Chondromalacia patellae, right knee: Secondary | ICD-10-CM

## 2019-03-03 DIAGNOSIS — M2242 Chondromalacia patellae, left knee: Secondary | ICD-10-CM

## 2019-03-15 DIAGNOSIS — M542 Cervicalgia: Secondary | ICD-10-CM | POA: Diagnosis not present

## 2019-03-15 DIAGNOSIS — G43719 Chronic migraine without aura, intractable, without status migrainosus: Secondary | ICD-10-CM | POA: Diagnosis not present

## 2019-03-23 ENCOUNTER — Other Ambulatory Visit: Payer: Self-pay

## 2019-03-23 DIAGNOSIS — Z79899 Other long term (current) drug therapy: Secondary | ICD-10-CM

## 2019-03-24 LAB — CBC WITH DIFFERENTIAL/PLATELET
Absolute Monocytes: 308 cells/uL (ref 200–950)
Basophils Absolute: 62 cells/uL (ref 0–200)
Basophils Relative: 1.1 %
Eosinophils Absolute: 62 cells/uL (ref 15–500)
Eosinophils Relative: 1.1 %
HCT: 42.1 % (ref 35.0–45.0)
Hemoglobin: 14 g/dL (ref 11.7–15.5)
Lymphs Abs: 1686 cells/uL (ref 850–3900)
MCH: 30.6 pg (ref 27.0–33.0)
MCHC: 33.3 g/dL (ref 32.0–36.0)
MCV: 91.9 fL (ref 80.0–100.0)
MPV: 12.9 fL — ABNORMAL HIGH (ref 7.5–12.5)
Monocytes Relative: 5.5 %
Neutro Abs: 3483 cells/uL (ref 1500–7800)
Neutrophils Relative %: 62.2 %
Platelets: 245 10*3/uL (ref 140–400)
RBC: 4.58 10*6/uL (ref 3.80–5.10)
RDW: 12.3 % (ref 11.0–15.0)
Total Lymphocyte: 30.1 %
WBC: 5.6 10*3/uL (ref 3.8–10.8)

## 2019-03-24 LAB — COMPLETE METABOLIC PANEL WITH GFR
AG Ratio: 2 (calc) (ref 1.0–2.5)
ALT: 11 U/L (ref 6–29)
AST: 14 U/L (ref 10–30)
Albumin: 4.7 g/dL (ref 3.6–5.1)
Alkaline phosphatase (APISO): 41 U/L (ref 31–125)
BUN: 9 mg/dL (ref 7–25)
CO2: 24 mmol/L (ref 20–32)
Calcium: 9.3 mg/dL (ref 8.6–10.2)
Chloride: 106 mmol/L (ref 98–110)
Creat: 0.73 mg/dL (ref 0.50–1.10)
GFR, Est African American: 129 mL/min/{1.73_m2} (ref >=60)
GFR, Est Non African American: 111 mL/min/{1.73_m2} (ref >=60)
Globulin: 2.4 g/dL (calc) (ref 1.9–3.7)
Glucose, Bld: 108 mg/dL (ref 65–139)
Potassium: 3.9 mmol/L (ref 3.5–5.3)
Sodium: 139 mmol/L (ref 135–146)
Total Bilirubin: 0.4 mg/dL (ref 0.2–1.2)
Total Protein: 7.1 g/dL (ref 6.1–8.1)

## 2019-03-29 DIAGNOSIS — M542 Cervicalgia: Secondary | ICD-10-CM | POA: Diagnosis not present

## 2019-03-29 DIAGNOSIS — G43719 Chronic migraine without aura, intractable, without status migrainosus: Secondary | ICD-10-CM | POA: Diagnosis not present

## 2019-03-31 DIAGNOSIS — Z309 Encounter for contraceptive management, unspecified: Secondary | ICD-10-CM | POA: Diagnosis not present

## 2019-03-31 DIAGNOSIS — Z3041 Encounter for surveillance of contraceptive pills: Secondary | ICD-10-CM | POA: Diagnosis not present

## 2019-04-12 DIAGNOSIS — G43719 Chronic migraine without aura, intractable, without status migrainosus: Secondary | ICD-10-CM | POA: Diagnosis not present

## 2019-04-12 DIAGNOSIS — M542 Cervicalgia: Secondary | ICD-10-CM | POA: Diagnosis not present

## 2019-04-16 ENCOUNTER — Ambulatory Visit: Payer: BC Managed Care – PPO | Attending: Internal Medicine

## 2019-04-16 DIAGNOSIS — Z20822 Contact with and (suspected) exposure to covid-19: Secondary | ICD-10-CM

## 2019-04-17 LAB — NOVEL CORONAVIRUS, NAA: SARS-CoV-2, NAA: NOT DETECTED

## 2019-04-23 ENCOUNTER — Other Ambulatory Visit: Payer: Self-pay | Admitting: Rheumatology

## 2019-04-23 DIAGNOSIS — M359 Systemic involvement of connective tissue, unspecified: Secondary | ICD-10-CM

## 2019-04-23 NOTE — Telephone Encounter (Signed)
Last Visit: 03/03/19 Next Visit: 06/02/19 Labs: 03/23/19 WNL Eye exam: 03/03/2019 normal.  Okay to refill per Dr. Corliss Skains

## 2019-04-26 ENCOUNTER — Encounter: Payer: Self-pay | Admitting: Family Medicine

## 2019-04-26 ENCOUNTER — Other Ambulatory Visit: Payer: Self-pay | Admitting: Psychiatry

## 2019-04-26 DIAGNOSIS — G43809 Other migraine, not intractable, without status migrainosus: Secondary | ICD-10-CM

## 2019-04-26 DIAGNOSIS — F411 Generalized anxiety disorder: Secondary | ICD-10-CM

## 2019-04-27 DIAGNOSIS — G43719 Chronic migraine without aura, intractable, without status migrainosus: Secondary | ICD-10-CM | POA: Diagnosis not present

## 2019-04-27 DIAGNOSIS — M542 Cervicalgia: Secondary | ICD-10-CM | POA: Diagnosis not present

## 2019-05-10 ENCOUNTER — Other Ambulatory Visit: Payer: Self-pay | Admitting: Psychiatry

## 2019-05-10 DIAGNOSIS — F411 Generalized anxiety disorder: Secondary | ICD-10-CM

## 2019-05-10 DIAGNOSIS — G43829 Menstrual migraine, not intractable, without status migrainosus: Secondary | ICD-10-CM | POA: Diagnosis not present

## 2019-05-10 DIAGNOSIS — M542 Cervicalgia: Secondary | ICD-10-CM | POA: Diagnosis not present

## 2019-05-10 DIAGNOSIS — G43719 Chronic migraine without aura, intractable, without status migrainosus: Secondary | ICD-10-CM | POA: Diagnosis not present

## 2019-05-13 ENCOUNTER — Ambulatory Visit: Payer: BC Managed Care – PPO | Attending: Internal Medicine

## 2019-05-13 DIAGNOSIS — Z23 Encounter for immunization: Secondary | ICD-10-CM | POA: Insufficient documentation

## 2019-05-13 NOTE — Progress Notes (Signed)
   Covid-19 Vaccination Clinic  Name:  Jennifer Vincent    MRN: [id number] DOB: 02/20/1991  05/13/2019  Jennifer Vincent was observed post Covid-19 immunization for 15 minutes without incidence. She was provided with Vaccine Information Sheet and instruction to access the V-Safe system.   Jennifer Vincent was instructed to call 911 with any severe reactions post vaccine: Marland Kitchen Difficulty breathing  . Swelling of your face and throat  . A fast heartbeat  . A bad rash all over your body  . Dizziness and weakness    Immunizations Administered    Name Date Dose VIS Date Route   Pfizer COVID-19 Vaccine 05/13/2019  9:43 AM 0.3 mL 02/26/2019 Intramuscular   Manufacturer: [employer], [employer]   Lot: [id number]   NDC: [address]-6

## 2019-05-25 NOTE — Progress Notes (Deleted)
Office Visit Note  Patient: Jennifer Vincent             Date of Birth: 09/23/1990           MRN: [id number]             PCP: Caren Macadam, MD Referring: Caren Macadam, MD Visit Date: 06/02/2019 Occupation: @GUAROCC @  Subjective:  No chief complaint on file.   History of Present Illness: Meria Vincent is a 29 y.o. female ***   Activities of Daily Living:  Patient reports morning stiffness for *** {minute/hour:19697}.   Patient {ACTIONS;DENIES/REPORTS:21021675::"Denies"} nocturnal pain.  Difficulty dressing/grooming: {ACTIONS;DENIES/REPORTS:21021675::"Denies"} Difficulty climbing stairs: {ACTIONS;DENIES/REPORTS:21021675::"Denies"} Difficulty getting out of chair: {ACTIONS;DENIES/REPORTS:21021675::"Denies"} Difficulty using hands for taps, buttons, cutlery, and/or writing: {ACTIONS;DENIES/REPORTS:21021675::"Denies"}  No Rheumatology ROS completed.   PMFS History:  Patient Active Problem List   Diagnosis Date Noted  . Pregnant 07/03/2018  . Generalized anxiety disorder 01/11/2018  . Panic disorder 10/04/2016  . Anxiety 10/04/2016    Past Medical History:  Diagnosis Date  . Allergic rhinitis   . Anxiety   . Frequent headaches   . IBS (irritable bowel syndrome)   . PCOS (polycystic ovarian syndrome)     Family History  Problem Relation Age of Onset  . Depression Mother   . Eating disorder Mother   . Arthritis Father   . Crohn's disease Father   . Arthritis Brother   . Alcohol abuse Brother   . Mental illness Brother   . Depression Brother   . Anxiety disorder Brother   . Crohn's disease Brother   . Mental illness Maternal Grandfather   . Anxiety disorder Maternal Grandfather   . OCD Maternal Grandfather   . Heart disease Paternal Grandfather   . Diabetes Paternal Grandfather    Past Surgical History:  Procedure Laterality Date  . 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 Influenza-Unspecified 12/16/2016, 12/25/2017, 12/16/2018  . PFIZER SARS-COV-2 Vaccination 05/13/2019  . PPD Test 10/13/2014     Objective: Vital Signs: There were no vitals taken for this visit.   Physical Exam   Musculoskeletal Exam: ***  CDAI Exam: CDAI Score: -- Patient Global: --; Provider Global: -- Swollen: --; Tender: -- Joint Exam 06/02/2019   No joint exam has been documented for this visit   There is currently no information documented on the homunculus. Go to the Rheumatology activity and complete the homunculus joint exam.  Investigation: No additional findings.  Imaging: No results found.  Recent Labs: Lab Results  Component Value Date   WBC 5.6 03/23/2019   HGB 14.0 03/23/2019   PLT 245 03/23/2019   NA 139 03/23/2019   K 3.9 03/23/2019   CL 106 03/23/2019   CO2 24 03/23/2019   GLUCOSE 108 03/23/2019   BUN 9 03/23/2019   CREATININE 0.73 03/23/2019   BILITOT 0.4 03/23/2019   ALKPHOS 54 01/18/2019   AST 14 03/23/2019   ALT 11 03/23/2019   PROT 7.1 03/23/2019   ALBUMIN 4.7 01/18/2019   CALCIUM 9.3 03/23/2019   GFRAA 129 03/23/2019    Speciality Comments: PLQ eye exam: 03/03/2019 normal. [facility]. Follow up in 1 year.  Procedures:  No  procedures performed Allergies: Lactose intolerance (gi)   Assessment / Plan:     Visit Diagnoses: No diagnosis found.  Orders: No orders of the defined types were placed in this encounter.  No orders of the defined types were placed in this encounter.   Face-to-face time spent with patient was *** minutes. Greater than 50% of time was spent in counseling and coordination of care.  Follow-Up Instructions: No follow-ups on file.   Ellen Henri, CMA  Note - This record has been created using Animal nutritionist.   Chart creation errors have been sought, but may not always  have been located. Such creation errors do not reflect on  the standard of medical care.

## 2019-05-30 IMAGING — DX DG HAND COMPLETE 3+V*R*
3 series · 3 of 3 positions shown · non-contrast
Comparison: None.

CLINICAL DATA: Chronic pain, no injury, some swelling

EXAM:
RIGHT HAND - COMPLETE 3+ VIEW

[hand ap]
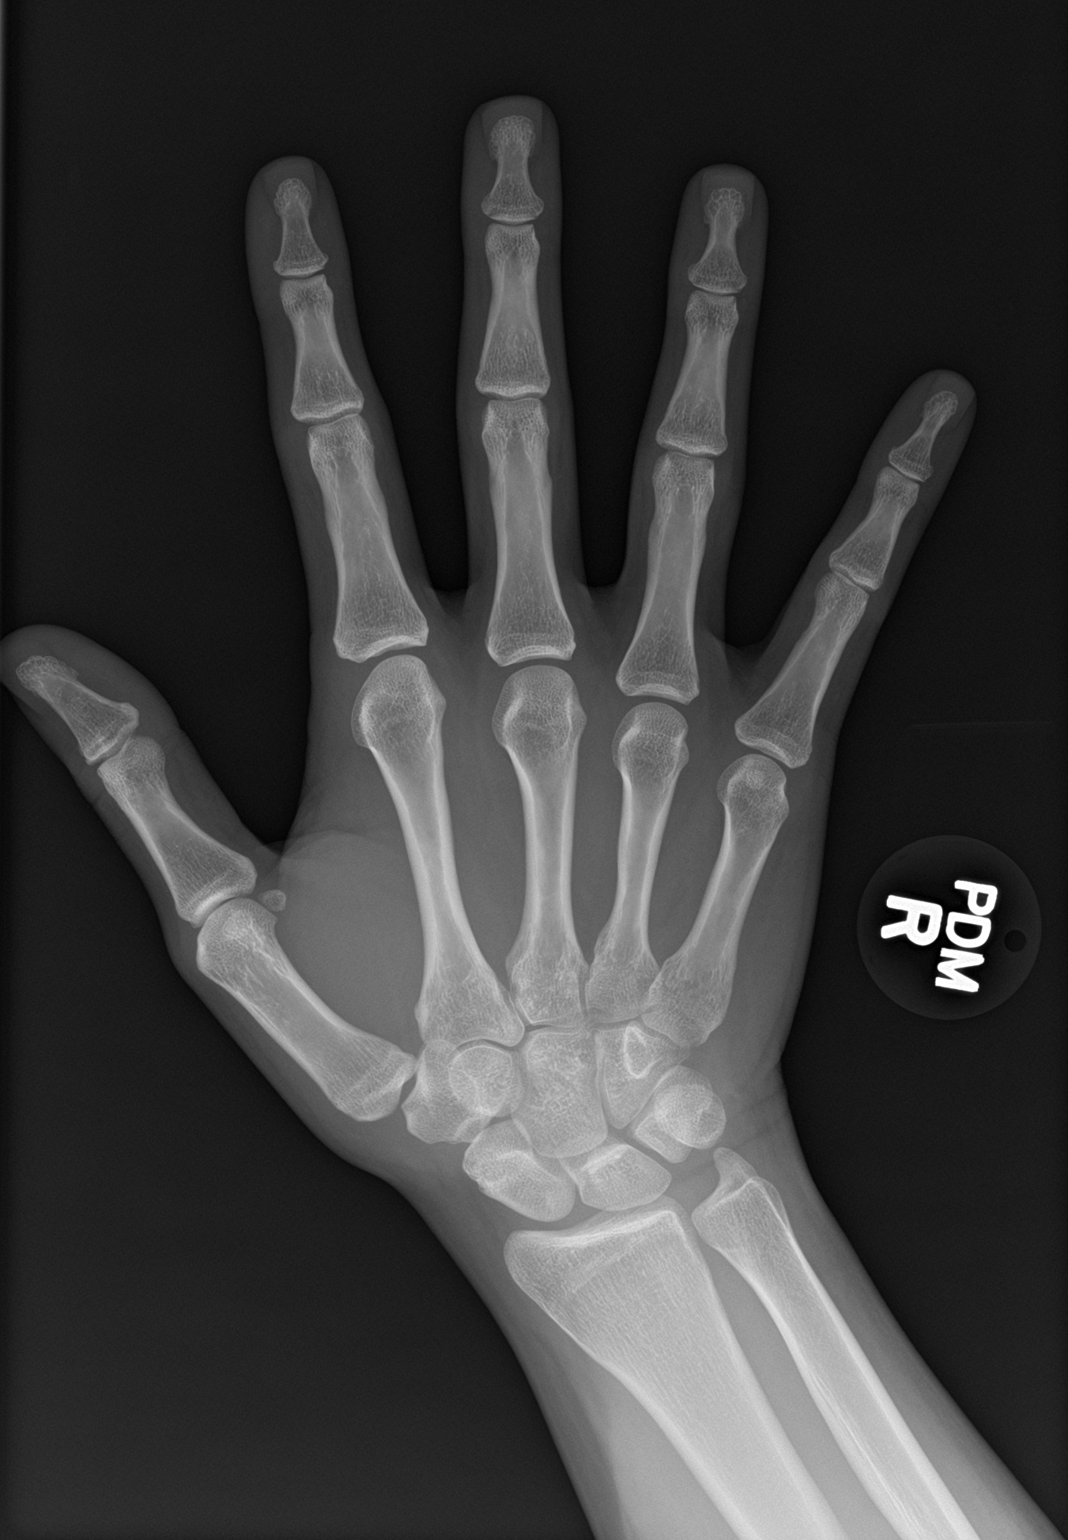

[hand obl]
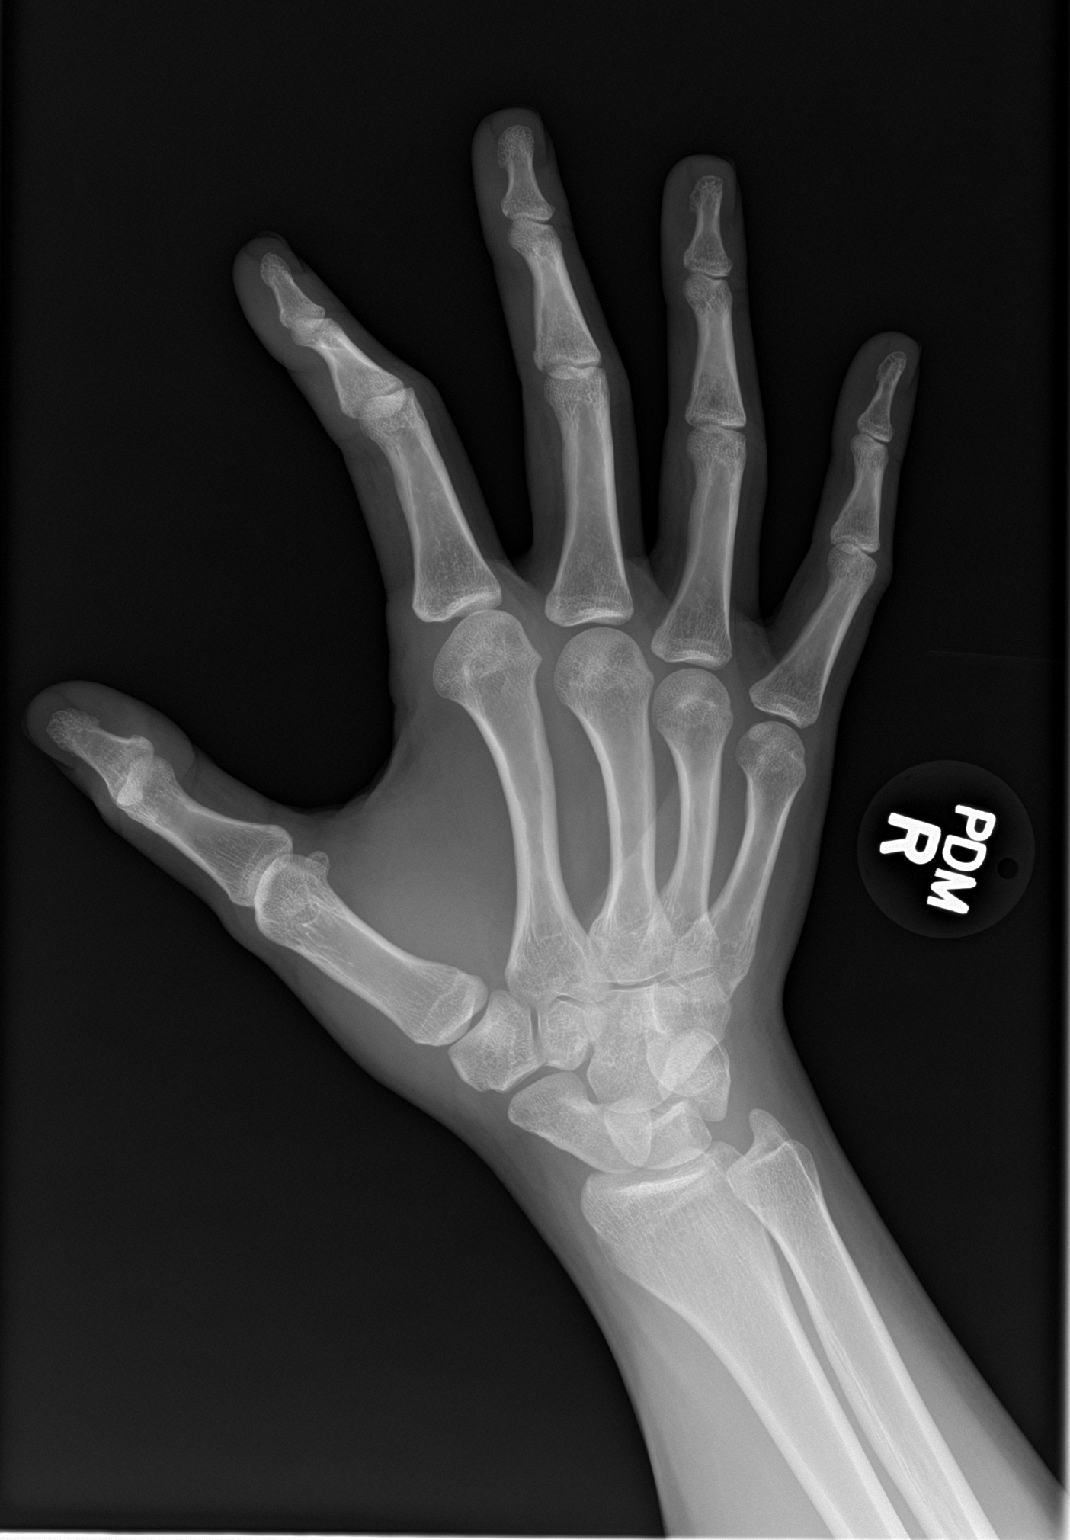

[hand lat]
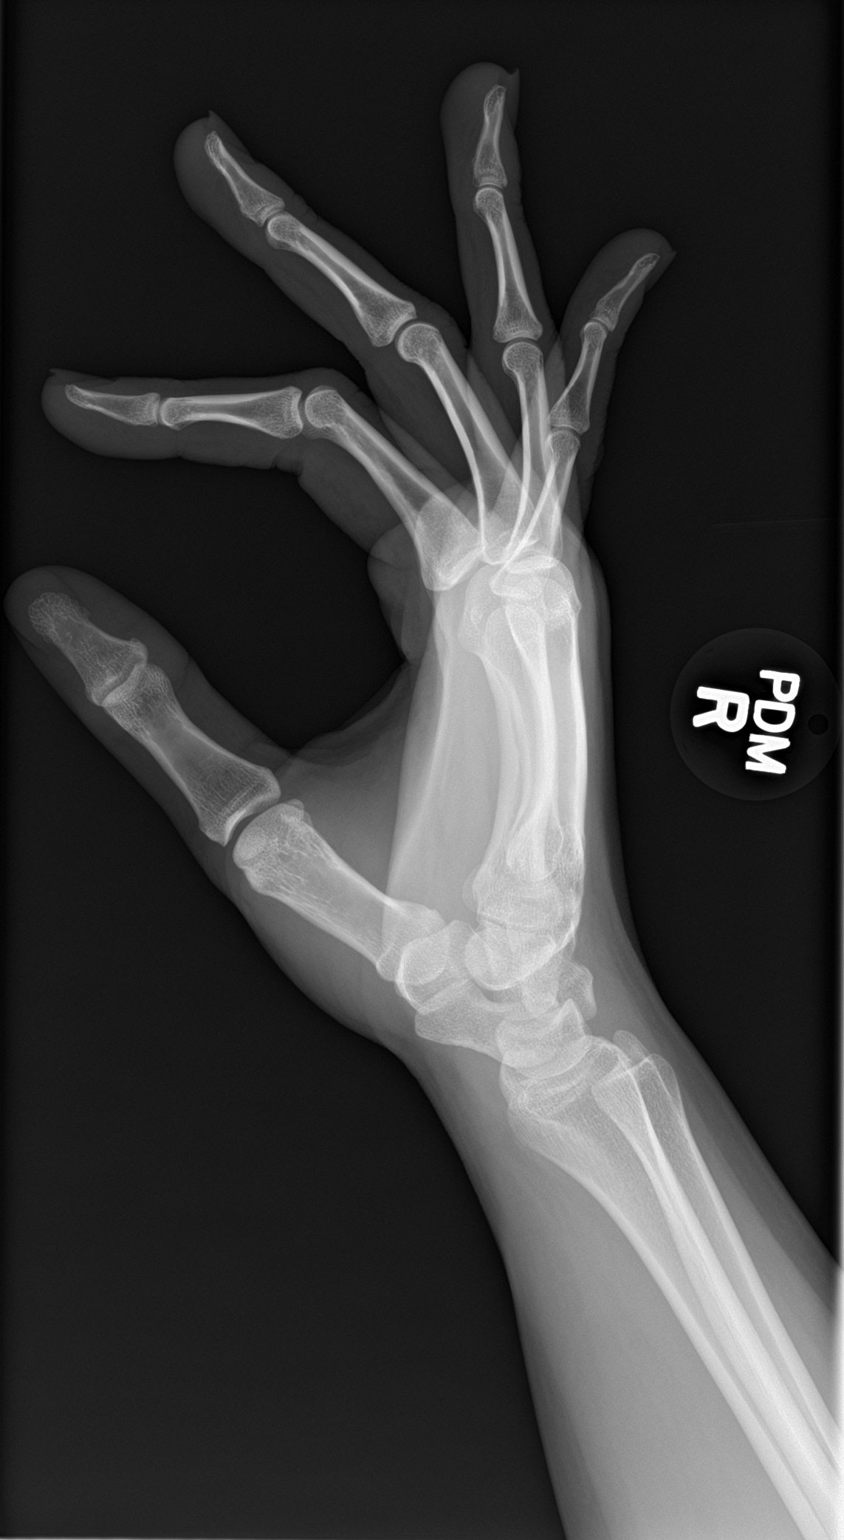

[3 of 3 positions shown; findings below may reference images not displayed]

FINDINGS: The radiocarpal joint space appears normal and the ulnar styloid is
intact. MCP, PIP, and DIP joints appear normal. No erosion is seen.
IMPRESSION: Negative.

## 2019-05-31 NOTE — Progress Notes (Addendum)
[facility]    Provider:  Dr Lucia Gaskins Requesting Provider: Wynn Banker, MD Primary Care Provider:  Wynn Banker, MD  CC:  Severe migraines  HPI:  Jennifer Vincent is a 29 y.o. female here as requested by Wynn Banker, MD for migraines. [facility] is [facility], Dr. Neale Burly. PMHx autoimmune disease, PCOS, frequent headache, anxiety.  I reviewed her chart for anything relevant to headaches, I do see that she was seen January 13, 2019 for migraine headaches, she had used Imitrex and Topamax which seemed to work well for her but Topamax made her feel jittery, for 2 days prior to this appointment she had a migraine headache with accompanied photophobia and phonophobia as well as nausea.  Motrin and Aleve without any improvement.  Headaches are affected by weather and stress.  Physical and neuro exam were unremarkable.  She was given Imitrex, naproxen, Zofran.  Unfortunately Dr. Onnie Boer notes are not available to me(we will request).   Severe migraine since she stopped breast-feeding at 6 months postpartum, onset of first menses.  She had migraines in college and afterwards they were sporadic, managed with Excedrin, she would get headaches a couple times a week but nothing that ibuprofen could manage.  After she stopped breast-feeding and started cycle again there were nothing like she had before.  They are excruciating around her periods. She has been seeing Dr. Neale Burly. She has also seen a neurologist in [place] who started her on topamax. Prior it was manageable, once migraine a month. In her 16s she would get a few headaches a week but nothing severe. She had a great pregnancy. Now they are severe and crippling they are so severe. Every time she gets her period they are intense. But it happens at other times as well. She is having migraines all month worse during her period. She has daily headaches. They can start behind the ears, can be  unilateral, pounding/throbbing/pulsating, can start behind the left eye and spreads, nausea, vomiting, light and sound sensitivity. Dr. Neale Burly started her on zonisamide up to 200 and not getting better. She had the nerve blocks. She tried maxalt and it helped but she would have rebound. She is waking up with headaches, they are worse positionally, she is having blurry vision.   Medications tried: topamax, maxalt, zoloft, imitrex, zonegran, propranolol, zofran,    Reviewed notes, labs and imaging from outside physicians, which showed:  See above for notes review. Also cbc/cmp normal 03/23/2019. Will request dr [facility]  Review of Systems: Patient complains of symptoms per HPI as well as the following symptoms: severe headaches, intractable. Pertinent negatives and positives per HPI. All others negative.   Social History   Socioeconomic History  . Marital status: Married    Spouse name: Not on file  . Number of children: 1  . Years of education: Not on file  . Highest education level: Master's degree (e.g., MA, MS, MEng, MEd, MSW, MBA)  Occupational History  . Not on file  Tobacco Use  . Smoking status: Never Smoker  . Smokeless tobacco: Never Used  Substance and Sexual Activity  . Alcohol use: Not Currently    Comment: rarely; quit summer 2019 (pre-pregnancy)  . Drug use: Never  . Sexual activity: Yes    Birth control/protection: Pill  Other Topics Concern  . 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 Difficulty of Paying Living Expenses:   Food Insecurity:   . Worried About Programme researcher, broadcasting/film/video in the  Last Year:   . Barista in the Last Year:   Transportation Needs:   . Freight forwarder (Medical):   Marland Kitchen Lack of Transportation (Non-Medical):   Physical Activity:   . Days of Exercise per Week:   . Minutes of Exercise per Session:   Stress:   . Feeling of Stress :   Social Connections:   . Frequency of Communication with Friends and Family:   . Frequency of Social Gatherings with Friends and Family:   . Attends Religious Services:   . Active Member of Clubs or Organizations:   . Attends Banker Meetings:   Marland Kitchen Marital Status:   Intimate Partner Violence:   . Fear of Current or Ex-Partner:   . Emotionally Abused:   Marland Kitchen Physically Abused:   . Sexually Abused:     Family History  Problem Relation Age of Onset  . Depression Mother   . Eating disorder Mother   . Arthritis Father   . Crohn's disease Father   . Ulcerative colitis Father   . Arthritis Brother   . Alcohol abuse Brother   . Mental illness Brother   . Depression Brother   . Anxiety disorder Brother   . Crohn's disease Brother   . Mental illness Maternal Grandfather   . Anxiety disorder Maternal Grandfather   . OCD Maternal Grandfather   . Heart disease Paternal Grandfather   . Diabetes Paternal Grandfather   . Heart attack Paternal Grandfather   . Breast cancer Maternal Grandmother     Past Medical History:  Diagnosis Date  . Allergic rhinitis   . Anxiety   . Autoimmune disorder (HCC)    nonspecified  . Frequent headaches   . IBS (irritable bowel syndrome)   . Migraine   . PCOS (polycystic ovarian syndrome)     Patient Active Problem List   Diagnosis Date Noted  . Chronic migraine without aura, with intractable migraine, so stated, with status migrainosus 06/01/2019  . Pregnant 07/03/2018  . Generalized anxiety disorder 01/11/2018  . Panic disorder 10/04/2016  . Anxiety 10/04/2016    Past Surgical History:  Procedure Laterality Date  . TONSILLECTOMY      Current  Outpatient Medications  Medication Sig Dispense Refill  . ALPRAZolam (XANAX) 0.5 MG tablet Take 1 tablet (0.5 mg total) by mouth 2 (two) times daily as needed for anxiety. 60 tablet 1  . busPIRone (BUSPAR) 15 MG tablet Take 17.5 mg by mouth 2 (two) times daily.    . hydroxychloroquine (PLAQUENIL) 200 MG tablet TAKE 1 TABLET BY MOUTH TWICE A DAY 60 tablet 2  . norethindrone (MICRONOR) 0.35 MG tablet Take 1 tablet by mouth daily.    Marland Kitchen perphenazine (TRILAFON) 4 MG tablet Take 4 mg by mouth as needed.    . sertraline (ZOLOFT) 100 MG tablet TAKE 1 TABLET BY MOUTH EVERY DAY 90 tablet 0  . zonisamide (ZONEGRAN) 100 MG capsule Take 2 capsules (200 mg total)  by mouth at bedtime. 60 capsule 6  . Fremanezumab-vfrm (AJOVY) 225 MG/1.5ML SOAJ Inject 225 mg into the skin every 30 (thirty) days. 1 pen 11  . methylPREDNISolone (MEDROL DOSEPAK) 4 MG TBPK tablet Take the pills daily with food for 6 days 21 tablet 1  . Ubrogepant (UBRELVY) 50 MG TABS Take 50 mg by mouth in the morning and at bedtime. 7 tablet 0   No current facility-administered medications for this visit.    Allergies as of 06/01/2019 - Review Complete 06/01/2019  Allergen Reaction Noted  . Lactose intolerance (gi)  01/18/2019    Vitals: BP 115/75 (BP Location: Right Arm, Patient Position: Sitting)   Pulse 86   Temp 98.6 F (37 C) Comment: taken at front  Ht 5\' 8"  (1.727 m)   Wt 199 lb (90.3 kg)   BMI 30.26 kg/m  Last Weight:  Wt Readings from Last 1 Encounters:  06/01/19 199 lb (90.3 kg)   Last Height:   Ht Readings from Last 1 Encounters:  06/01/19 5\' 8"  (1.727 m)     Physical exam: Exam: Gen: NAD, conversant, well nourised, well groomed                     CV: RRR, no MRG. No Carotid Bruits. No peripheral edema, warm, nontender Eyes: Conjunctivae clear without exudates or hemorrhage  Neuro: Detailed Neurologic Exam  Speech:    Speech is normal; fluent and spontaneous with normal comprehension.  Cognition:    The  patient is oriented to person, place, and time;     recent and remote memory intact;     language fluent;     normal attention, concentration,     fund of knowledge Cranial Nerves:    The pupils are equal, round, and reactive to light. The fundi are normal and spontaneous venous pulsations are present. Visual fields are full to finger confrontation. Extraocular movements are intact. Trigeminal sensation is intact and the muscles of mastication are normal. The face is symmetric. The palate elevates in the midline. Hearing intact. Voice is normal. Shoulder shrug is normal. The tongue has normal motion without fasciculations.   Coordination:    No dysmetria.   Gait:  normal native gait  Motor Observation:    No asymmetry, no atrophy, and no involuntary movements noted. Tone:    Normal muscle tone.    Posture:    Posture is normal. normal erect    Strength:    Strength is V/V in the upper and lower limbs.      Sensation: intact to LT     Reflex Exam:  DTR's:    Deep tendon reflexes in the upper and lower extremities are normal bilaterally.   Toes:    The toes are downgoing bilaterally.   Clonus:    Clonus is absent.    Assessment/Plan:  29 year old with chronic migraines, they are worse during her menses but she is also having daily headaches and severe migraines throughout the month. Patient is really suffering,crying today, she is having severe headaches and she is a new mom. We could start Ajovy as this is the quickest medication we have, with daily oral meds we have to start low and titrate and this takes time, and patient is suffering and crying in the office. We will give her a medrol dosepak and Ubrelvy to bridge for a few days. We can discuss acute management and what we do during pregnancy at next appointment (they want to start  trying later this year), we did discuss she has to be off of the [facility] for 5 months prior to trying to geting pregnant. Also need an MRI.   MRI  brain due to concerning symptoms, severe headache, to look for etiologies such as space occupying mass, chiari or intracranial hypertension (pseudotumor). She is also post-partum and there are several serious conditions that can happen during pregnancy and after pregnancy such as enlarged pituitary so need imaging. PCOS, age and being overweight also puts her at higher risk for IDIOPATHIC INTRACRANIAL HYPERTENSION which we can see signs of on MRI.    Orders Placed This Encounter  Procedures  . MR BRAIN W WO CONTRAST   Meds ordered this encounter  Medications  . methylPREDNISolone (MEDROL DOSEPAK) 4 MG TBPK tablet    Sig: Take the pills daily with food for 6 days    Dispense:  21 tablet    Refill:  1  . Fremanezumab-vfrm (AJOVY) 225 MG/1.5ML SOAJ    Sig: Inject 225 mg into the skin every 30 (thirty) days.    Dispense:  1 pen    Refill:  11  . Ubrogepant (UBRELVY) 50 MG TABS    Sig: Take 50 mg by mouth in the morning and at bedtime.    Dispense:  7 tablet    Refill:  0  . zonisamide (ZONEGRAN) 100 MG capsule    Sig: Take 2 capsules (200 mg total) by mouth at bedtime.    Dispense:  60 capsule    Refill:  6    Cc: Caren Macadam, MD  Sarina Ill, MD  [facility] [address] [place], [place] [address]  Phone [phone] Fax [phone]

## 2019-05-31 NOTE — Progress Notes (Deleted)
Office Visit Note  Patient: Jennifer Vincent             Date of Birth: 01/23/1991           MRN: [id number]             PCP: Caren Macadam, MD Referring: Caren Macadam, MD Visit Date: 06/08/2019 Occupation: @GUAROCC @  Subjective:  No chief complaint on file.   History of Present Illness: Jennifer Vincent is a 29 y.o. female ***   Activities of Daily Living:  Patient reports morning stiffness for *** {minute/hour:19697}.   Patient {ACTIONS;DENIES/REPORTS:21021675::"Denies"} nocturnal pain.  Difficulty dressing/grooming: {ACTIONS;DENIES/REPORTS:21021675::"Denies"} Difficulty climbing stairs: {ACTIONS;DENIES/REPORTS:21021675::"Denies"} Difficulty getting out of chair: {ACTIONS;DENIES/REPORTS:21021675::"Denies"} Difficulty using hands for taps, buttons, cutlery, and/or writing: {ACTIONS;DENIES/REPORTS:21021675::"Denies"}  No Rheumatology ROS completed.   PMFS History:  Patient Active Problem List   Diagnosis Date Noted  . Pregnant 07/03/2018  . Generalized anxiety disorder 01/11/2018  . Panic disorder 10/04/2016  . Anxiety 10/04/2016    Past Medical History:  Diagnosis Date  . Allergic rhinitis   . Anxiety   . Frequent headaches   . IBS (irritable bowel syndrome)   . PCOS (polycystic ovarian syndrome)     Family History  Problem Relation Age of Onset  . Depression Mother   . Eating disorder Mother   . Arthritis Father   . Crohn's disease Father   . Arthritis Brother   . Alcohol abuse Brother   . Mental illness Brother   . Depression Brother   . Anxiety disorder Brother   . Crohn's disease Brother   . Mental illness Maternal Grandfather   . Anxiety disorder Maternal Grandfather   . OCD Maternal Grandfather   . Heart disease Paternal Grandfather   . Diabetes Paternal Grandfather    Past Surgical History:  Procedure Laterality Date  . 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 Influenza-Unspecified 12/16/2016, 12/25/2017, 12/16/2018  . PFIZER SARS-COV-2 Vaccination 05/13/2019  . PPD Test 10/13/2014     Objective: Vital Signs: There were no vitals taken for this visit.   Physical Exam   Musculoskeletal Exam: ***  CDAI Exam: CDAI Score: -- Patient Global: --; Provider Global: -- Swollen: --; Tender: -- Joint Exam 06/08/2019   No joint exam has been documented for this visit   There is currently no information documented on the homunculus. Go to the Rheumatology activity and complete the homunculus joint exam.  Investigation: No additional findings.  Imaging: No results found.  Recent Labs: Lab Results  Component Value Date   WBC 5.6 03/23/2019   HGB 14.0 03/23/2019   PLT 245 03/23/2019   NA 139 03/23/2019   K 3.9 03/23/2019   CL 106 03/23/2019   CO2 24 03/23/2019   GLUCOSE 108 03/23/2019   BUN 9 03/23/2019   CREATININE 0.73 03/23/2019   BILITOT 0.4 03/23/2019   ALKPHOS 54 01/18/2019   AST 14 03/23/2019   ALT 11 03/23/2019   PROT 7.1 03/23/2019   ALBUMIN 4.7 01/18/2019   CALCIUM 9.3 03/23/2019   GFRAA 129 03/23/2019    Speciality Comments: PLQ eye exam: 03/03/2019 normal. [facility]. Follow up in 1 year.  Procedures:  No  procedures performed Allergies: Lactose intolerance (gi)   Assessment / Plan:     Visit Diagnoses: No diagnosis found.  Orders: No orders of the defined types were placed in this encounter.  No orders of the defined types were placed in this encounter.   Face-to-face time spent with patient was *** minutes. Greater than 50% of time was spent in counseling and coordination of care.  Follow-Up Instructions: No follow-ups on file.   Ellen Henri, CMA  Note - This record has been created using Animal nutritionist.   Chart creation errors have been sought, but may not always  have been located. Such creation errors do not reflect on  the standard of medical care.

## 2019-06-01 ENCOUNTER — Other Ambulatory Visit: Payer: Self-pay

## 2019-06-01 ENCOUNTER — Encounter: Payer: Self-pay | Admitting: Neurology

## 2019-06-01 ENCOUNTER — Ambulatory Visit (INDEPENDENT_AMBULATORY_CARE_PROVIDER_SITE_OTHER): Payer: BC Managed Care – PPO | Admitting: Neurology

## 2019-06-01 VITALS — BP 115/75 | HR 86 | Temp 98.6°F | Ht 68.0 in | Wt 199.0 lb

## 2019-06-01 DIAGNOSIS — H538 Other visual disturbances: Secondary | ICD-10-CM

## 2019-06-01 DIAGNOSIS — R51 Headache with orthostatic component, not elsewhere classified: Secondary | ICD-10-CM

## 2019-06-01 DIAGNOSIS — G43711 Chronic migraine without aura, intractable, with status migrainosus: Secondary | ICD-10-CM

## 2019-06-01 DIAGNOSIS — R519 Headache, unspecified: Secondary | ICD-10-CM | POA: Diagnosis not present

## 2019-06-01 DIAGNOSIS — H539 Unspecified visual disturbance: Secondary | ICD-10-CM

## 2019-06-01 MED ORDER — METHYLPREDNISOLONE 4 MG PO TBPK: ORAL_TABLET | ORAL | 1 refills | Status: DC

## 2019-06-01 MED ORDER — UBRELVY 50 MG PO TABS: 50.0000 mg | ORAL_TABLET | Freq: Two times a day (BID) | ORAL | 0 refills | Status: DC

## 2019-06-01 MED ORDER — AJOVY 225 MG/1.5ML ~~LOC~~ SOAJ: 225.0000 mg | SUBCUTANEOUS | 11 refills | Status: DC

## 2019-06-01 MED ORDER — ZONISAMIDE 100 MG PO CAPS: 200.0000 mg | ORAL_CAPSULE | Freq: Every evening | ORAL | 6 refills | Status: DC

## 2019-06-01 NOTE — Patient Instructions (Signed)
Starting Ajovy Start 6 day medrol dosepak taper Ubrelvy 2x daily for a few days and then as needed  Ubrogepant tablets What is this medicine? UBROGEPANT (ue BROE je pant) is used to treat migraine headaches with or without aura. An aura is a strange feeling or visual disturbance that warns you of an attack. It is not used to prevent migraines. This medicine may be used for other purposes; ask your health care provider or pharmacist if you have questions. COMMON BRAND NAME(S): [name] What should I tell my health care provider before I take this medicine? They need to know if you have any of these conditions:  kidney disease  liver disease  an unusual or allergic reaction to ubrogepant, other medicines, foods, dyes, or preservatives  pregnant or trying to get pregnant  breast-feeding How should I use this medicine? Take this medicine by mouth with a glass of water. Follow the directions on the prescription label. You can take it with or without food. If it upsets your stomach, take it with food. Take your medicine at regular intervals. Do not take it more often than directed. Do not stop taking except on your doctor's advice. Talk to your pediatrician about the use of this medicine in children. Special care may be needed. Overdosage: If you think you have taken too much of this medicine contact a poison control center or emergency room at once. NOTE: This medicine is only for you. Do not share this medicine with others. What if I miss a dose? This does not apply. This medicine is not for regular use. What may interact with this medicine? Do not take this medicine with any of the following medicines:  ceritinib  certain antibiotics like chloramphenicol, clarithromycin, telithromycin  certain antivirals for HIV like atazanavir, cobicistat, darunavir, delavirdine, fosamprenavir, indinavir, ritonavir  certain medicines for fungal infections like itraconazole, ketoconazole,  posaconazole, voriconazole  conivaptan  grapefruit  idelalisib  mifepristone  nefazodone  ribociclib This medicine may also interact with the following medications:  carvedilol  certain medicines for seizures like phenobarbital, phenytoin  ciprofloxacin  cyclosporine  eltrombopag  fluconazole  fluvoxamine  quinidine  rifampin  St. John's wort  verapamil This list may not describe all possible interactions. Give your health care provider a list of all the medicines, herbs, non-prescription drugs, or dietary supplements you use. Also tell them if you smoke, drink alcohol, or use illegal drugs. Some items may interact with your medicine. What should I watch for while using this medicine? Visit your health care professional for regular checks on your progress. Tell your health care professional if your symptoms do not start to get better or if they get worse. Your mouth may get dry. Chewing sugarless gum or sucking hard candy and drinking plenty of water may help. Contact your health care professional if the problem does not go away or is severe. What side effects may I notice from receiving this medicine? Side effects that you should report to your doctor or health care professional as soon as possible:  allergic reactions like skin rash, itching or hives; swelling of the face, lips, or tongue Side effects that usually do not require medical attention (report these to your doctor or health care professional if they continue or are bothersome):  drowsiness  dry mouth  nausea  tiredness This list may not describe all possible side effects. Call your doctor for medical advice about side effects. You may report side effects to FDA at 1-800-FDA-1088. Where should I keep  my medicine? Keep out of the reach of children. Store at room temperature between 15 and 30 degrees C (59 and 86 degrees F). Throw away any unused medicine after the expiration date. NOTE: This sheet is  a summary. It may not cover all possible information. If you have questions about this medicine, talk to your doctor, pharmacist, or health care provider.  2020 Elsevier/Gold Standard (2018-05-21 08:50:55) Methylprednisolone tablets What is this medicine? METHYLPREDNISOLONE (meth ill pred NISS oh lone) is a corticosteroid. It is commonly used to treat inflammation of the skin, joints, lungs, and other organs. Common conditions treated include asthma, allergies, and arthritis. It is also used for other conditions, such as blood disorders and diseases of the adrenal glands. This medicine may be used for other purposes; ask your health care provider or pharmacist if you have questions. COMMON BRAND NAME(S): Medrol, Medrol Dosepak What should I tell my health care provider before I take this medicine? They need to know if you have any of these conditions:  Cushing's syndrome  eye disease, vision problems  diabetes  glaucoma  heart disease  high blood pressure  infection (especially a virus infection such as chickenpox, cold sores, or herpes)  liver disease  mental illness  myasthenia gravis  osteoporosis  recently received or scheduled to receive a vaccine  seizures  stomach or intestine problems  thyroid disease  an unusual or allergic reaction to lactose, methylprednisolone, other medicines, foods, dyes, or preservatives  pregnant or trying to get pregnant  breast-feeding How should I use this medicine? Take this medicine by mouth with a glass of water. Follow the directions on the prescription label. Take this medicine with food. If you are taking this medicine once a day, take it in the morning. Do not take it more often than directed. Do not suddenly stop taking your medicine because you may develop a severe reaction. Your doctor will tell you how much medicine to take. If your doctor wants you to stop the medicine, the dose may be slowly lowered over time to avoid any  side effects. Talk to your pediatrician regarding the use of this medicine in children. Special care may be needed. Overdosage: If you think you have taken too much of this medicine contact a poison control center or emergency room at once. NOTE: This medicine is only for you. Do not share this medicine with others. What if I miss a dose? If you miss a dose, take it as soon as you can. If it is almost time for your next dose, talk to your doctor or health care professional. You may need to miss a dose or take an extra dose. Do not take double or extra doses without advice. What may interact with this medicine? Do not take this medicine with any of the following medications:  alefacept  echinacea  live virus vaccines  metyrapone  mifepristone This medicine may also interact with the following medications:  amphotericin B  aspirin and aspirin-like medicines  certain antibiotics like erythromycin, clarithromycin, troleandomycin  certain medicines for diabetes  certain medicines for fungal infections like ketoconazole  certain medicines for seizures like carbamazepine, phenobarbital, phenytoin  certain medicines that treat or prevent blood clots like warfarin  cholestyramine  cyclosporine  digoxin  diuretics  female hormones, like estrogens and birth control pills  isoniazid  NSAIDs, medicines for pain inflammation, like ibuprofen or naproxen  other medicines for myasthenia gravis  rifampin  vaccines This list may not describe all possible interactions. Give  your health care provider a list of all the medicines, herbs, non-prescription drugs, or dietary supplements you use. Also tell them if you smoke, drink alcohol, or use illegal drugs. Some items may interact with your medicine. What should I watch for while using this medicine? Tell your doctor or healthcare professional if your symptoms do not start to get better or if they get worse. Do not stop taking except  on your doctor's advice. You may develop a severe reaction. Your doctor will tell you how much medicine to take. This medicine may increase your risk of getting an infection. Tell your doctor or health care professional if you are around anyone with measles or chickenpox, or if you develop sores or blisters that do not heal properly. This medicine may increase blood sugar levels. Ask your healthcare provider if changes in diet or medicines are needed if you have diabetes. Tell your doctor or health care professional right away if you have any change in your eyesight. Using this medicine for a long time may increase your risk of low bone mass. Talk to your doctor about bone health. What side effects may I notice from receiving this medicine? Side effects that you should report to your doctor or health care professional as soon as possible:  allergic reactions like skin rash, itching or hives, swelling of the face, lips, or tongue  bloody or tarry stools  hallucination, loss of contact with reality  muscle cramps  muscle pain  palpitations  signs and symptoms of high blood sugar such as being more thirsty or hungry or having to urinate more than normal. You may also feel very tired or have blurry vision.  signs and symptoms of infection like fever or chills; cough; sore throat; pain or trouble passing urine Side effects that usually do not require medical attention (report to your doctor or health care professional if they continue or are bothersome):  changes in emotions or mood  constipation  diarrhea  excessive hair growth on the face or body  headache  nausea, vomiting  trouble sleeping  weight gain This list may not describe all possible side effects. Call your doctor for medical advice about side effects. You may report side effects to FDA at 1-800-FDA-1088. Where should I keep my medicine? Keep out of the reach of children. Store at room temperature between 20 and 25  degrees C (68 and 77 degrees F). Throw away any unused medicine after the expiration date. NOTE: This sheet is a summary. It may not cover all possible information. If you have questions about this medicine, talk to your doctor, pharmacist, or health care provider.  2020 Elsevier/Gold Standard (2017-12-04 09:19:36) Vernell Barrier injection What is this medicine? FREMANEZUMAB (fre ma NEZ ue mab) is used to prevent migraine headaches. This medicine may be used for other purposes; ask your health care provider or pharmacist if you have questions. COMMON BRAND NAME(S): AJOVY What should I tell my health care provider before I take this medicine? They need to know if you have any of these conditions:  an unusual or allergic reaction to fremanezumab, other medicines, foods, dyes, or preservatives  pregnant or trying to get pregnant  breast-feeding How should I use this medicine? This medicine is for injection under the skin. You will be taught how to prepare and give this medicine. Use exactly as directed. Take your medicine at regular intervals. Do not take your medicine more often than directed. It is important that you put your used needles and  syringes in a special sharps container. Do not put them in a trash can. If you do not have a sharps container, call your pharmacist or healthcare provider to get one. Talk to your pediatrician regarding the use of this medicine in children. Special care may be needed. Overdosage: If you think you have taken too much of this medicine contact a poison control center or emergency room at once. NOTE: This medicine is only for you. Do not share this medicine with others. What if I miss a dose? If you miss a dose, take it as soon as you can. If it is almost time for your next dose, take only that dose. Do not take double or extra doses. What may interact with this medicine? Interactions are not expected. This list may not describe all possible interactions.  Give your health care provider a list of all the medicines, herbs, non-prescription drugs, or dietary supplements you use. Also tell them if you smoke, drink alcohol, or use illegal drugs. Some items may interact with your medicine. What should I watch for while using this medicine? Tell your doctor or healthcare professional if your symptoms do not start to get better or if they get worse. What side effects may I notice from receiving this medicine? Side effects that you should report to your doctor or health care professional as soon as possible:  allergic reactions like skin rash, itching or hives, swelling of the face, lips, or tongue Side effects that usually do not require medical attention (report these to your doctor or health care professional if they continue or are bothersome):  pain, redness, or irritation at site where injected This list may not describe all possible side effects. Call your doctor for medical advice about side effects. You may report side effects to FDA at 1-800-FDA-1088. Where should I keep my medicine? Keep out of the reach of children. You will be instructed on how to store this medicine. Throw away any unused medicine after the expiration date on the label. NOTE: This sheet is a summary. It may not cover all possible information. If you have questions about this medicine, talk to your doctor, pharmacist, or health care provider.  2020 Elsevier/Gold Standard (2016-12-02 17:22:56)

## 2019-06-02 ENCOUNTER — Ambulatory Visit: Payer: BC Managed Care – PPO | Attending: Internal Medicine

## 2019-06-02 ENCOUNTER — Ambulatory Visit: Payer: BC Managed Care – PPO | Admitting: Physician Assistant

## 2019-06-02 ENCOUNTER — Ambulatory Visit: Payer: BC Managed Care – PPO

## 2019-06-02 DIAGNOSIS — Z23 Encounter for immunization: Secondary | ICD-10-CM

## 2019-06-02 NOTE — Progress Notes (Signed)
   Covid-19 Vaccination Clinic  Name:  Jennifer Vincent    MRN: [id number] DOB: Dec 13, 1990  06/02/2019  [name] was observed post Covid-19 immunization for 15 minutes without incident. She was provided with Vaccine Information Sheet and instruction to access the V-Safe system.   [name] was instructed to call 911 with any severe reactions post vaccine: Marland Kitchen Difficulty breathing  . Swelling of face and throat  . A fast heartbeat  . A bad rash all over body  . Dizziness and weakness   Immunizations Administered    Name Date Dose VIS Date Route   Pfizer COVID-19 Vaccine 06/02/2019  1:10 PM 0.3 mL 02/26/2019 Intramuscular   Manufacturer: [employer], [employer]   Lot: [id number]   NDC: [address]-4

## 2019-06-08 ENCOUNTER — Ambulatory Visit: Payer: BC Managed Care – PPO | Admitting: Physician Assistant

## 2019-06-08 ENCOUNTER — Telehealth: Payer: Self-pay | Admitting: Neurology

## 2019-06-08 NOTE — Telephone Encounter (Signed)
no to the covid questions MR Brain w/wo contrast Dr. Valaria Good Auth: [id number] (exp. 06/04/19 to 11/30/19). Patient is scheduled at [facility] for 06/15/19.

## 2019-06-09 ENCOUNTER — Other Ambulatory Visit: Payer: Self-pay | Admitting: Neurology

## 2019-06-09 MED ORDER — UBRELVY 100 MG PO TABS: 100.0000 mg | ORAL_TABLET | ORAL | 6 refills | Status: DC | PRN

## 2019-06-10 ENCOUNTER — Telehealth: Payer: Self-pay | Admitting: Neurology

## 2019-06-10 DIAGNOSIS — D225 Melanocytic nevi of trunk: Secondary | ICD-10-CM | POA: Diagnosis not present

## 2019-06-10 DIAGNOSIS — D2272 Melanocytic nevi of left lower limb, including hip: Secondary | ICD-10-CM | POA: Diagnosis not present

## 2019-06-10 DIAGNOSIS — D2262 Melanocytic nevi of left upper limb, including shoulder: Secondary | ICD-10-CM | POA: Diagnosis not present

## 2019-06-10 NOTE — Telephone Encounter (Signed)
Pt called requesting a CB for more information in regards to her upcoming MRI

## 2019-06-10 NOTE — Telephone Encounter (Signed)
Spoke with patient and answered her questions. Pt was advised on mychart she can see Dr. Trevor Mace note which included the rationale for ordering the MRI. Pt was very appreciative. We also discussed Ajovy  Savings card. Pt aware to take this to the pharmacy when refill due and is aware that although PA will be needed, she can still get the medication with the card. She verbalized appreciation for the call.

## 2019-06-14 NOTE — Telephone Encounter (Signed)
Pt has called to report she wishes to cancel her MRI for now and will call back to r/s it.

## 2019-06-14 NOTE — Telephone Encounter (Signed)
Noted, cancel patient appt for 06/15/19.

## 2019-06-15 ENCOUNTER — Telehealth: Payer: Self-pay | Admitting: *Deleted

## 2019-06-15 ENCOUNTER — Other Ambulatory Visit: Payer: BC Managed Care – PPO

## 2019-06-15 NOTE — Telephone Encounter (Signed)
Completed Ajovy PA on CMM. Key: [id number]. Awaiting [facility].

## 2019-06-15 NOTE — Telephone Encounter (Signed)
Completed Ubrelvy PA on CMM. Key: [id number]. Awaiting determination from [facility]. Pt has tried two triptans. Anticipate it will get approved.

## 2019-06-18 ENCOUNTER — Encounter: Payer: Self-pay | Admitting: *Deleted

## 2019-06-18 NOTE — Telephone Encounter (Signed)
Per [employer], Ajovy denied. Requires Emgality and Aimovig trials. Pt can use savings card. I sent a message to pt. If we should choose to send additional info/appeal, fax within 180 days to [phone].

## 2019-06-21 NOTE — Telephone Encounter (Signed)
No details have been provided but Cover My Meds says the [name] has been approved.

## 2019-06-25 NOTE — Progress Notes (Deleted)
Office Visit Note  Patient: Jennifer Vincent             Date of Birth: 05/30/1990           MRN: [id number]             PCP: Wynn Banker, MD Referring: Wynn Banker, MD Visit Date: 06/30/2019 Occupation: @GUAROCC @  Subjective:  No chief complaint on file.   History of Present Illness: Jennifer Vincent is a 29 y.o. female ***   Activities of Daily Living:  Patient reports morning stiffness for *** {minute/hour:19697}.   Patient {ACTIONS;DENIES/REPORTS:21021675::"Denies"} nocturnal pain.  Difficulty dressing/grooming: {ACTIONS;DENIES/REPORTS:21021675::"Denies"} Difficulty climbing stairs: {ACTIONS;DENIES/REPORTS:21021675::"Denies"} Difficulty getting out of chair: {ACTIONS;DENIES/REPORTS:21021675::"Denies"} Difficulty using hands for taps, buttons, cutlery, and/or writing: {ACTIONS;DENIES/REPORTS:21021675::"Denies"}  No Rheumatology ROS completed.   PMFS History:  Patient Active Problem List   Diagnosis Date Noted  . Chronic migraine without aura, with intractable migraine, so stated, with status migrainosus 06/01/2019  . Pregnant 07/03/2018  . Generalized anxiety disorder 01/11/2018  . Panic disorder 10/04/2016  . Anxiety 10/04/2016    Past Medical History:  Diagnosis Date  . Allergic rhinitis   . Anxiety   . Autoimmune disorder (HCC)    nonspecified  . Frequent headaches   . IBS (irritable bowel syndrome)   . Migraine   . PCOS (polycystic ovarian syndrome)     Family History  Problem Relation Age of Onset  . Depression Mother   . Eating disorder Mother   . Arthritis Father   . Crohn's disease Father   . Ulcerative colitis Father   . Arthritis Brother   . Alcohol abuse Brother   . Mental illness Brother   . Depression Brother   . Anxiety disorder Brother   . Crohn's disease Brother   . Mental illness Maternal Grandfather   . Anxiety disorder Maternal Grandfather   . OCD Maternal Grandfather   . Heart disease Paternal Grandfather   . Diabetes  Paternal Grandfather   . Heart attack Paternal Grandfather   . Breast cancer Maternal Grandmother    Past Surgical History:  Procedure Laterality Date  . 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 Influenza-Unspecified 12/16/2016, 12/25/2017, 12/16/2018  . PFIZER SARS-COV-2 Vaccination 05/13/2019, 06/02/2019  . PPD Test 10/13/2014     Objective: Vital Signs: There were no vitals taken for this visit.   Physical Exam   Musculoskeletal Exam: ***  CDAI Exam: CDAI Score: -- Patient Global: --; Provider Global: -- Swollen: --; Tender: -- Joint Exam 06/30/2019   No joint exam has been documented for this visit   There is currently no information documented on the homunculus. Go to the Rheumatology activity and complete the homunculus joint exam.  Investigation: No additional findings.  Imaging: No results found.  Recent Labs: Lab Results  Component Value Date   WBC 5.6 03/23/2019   HGB 14.0 03/23/2019   PLT 245 03/23/2019   NA 139 03/23/2019   K 3.9 03/23/2019   CL 106 03/23/2019   CO2  24 03/23/2019   GLUCOSE 108 03/23/2019   BUN 9 03/23/2019   CREATININE 0.73 03/23/2019   BILITOT 0.4 03/23/2019   ALKPHOS 54 01/18/2019   AST 14 03/23/2019   ALT 11 03/23/2019   PROT 7.1 03/23/2019   ALBUMIN 4.7 01/18/2019   CALCIUM 9.3 03/23/2019   GFRAA 129 03/23/2019    Speciality Comments: PLQ eye exam: 03/03/2019 normal. [facility]. Follow up in 1 year.  Procedures:  No procedures performed Allergies: Lactose intolerance (gi)   Assessment / Plan:     Visit Diagnoses: No diagnosis found.  Orders: No  orders of the defined types were placed in this encounter.  No orders of the defined types were placed in this encounter.   Face-to-face time spent with patient was *** minutes. Greater than 50% of time was spent in counseling and coordination of care.  Follow-Up Instructions: No follow-ups on file.   Ofilia Neas, PA-C  Note - This record has been created using Dragon software.  Chart creation errors have been sought, but may not always  have been located. Such creation errors do not reflect on  the standard of medical care.

## 2019-06-29 ENCOUNTER — Telehealth: Payer: Self-pay | Admitting: Rheumatology

## 2019-06-29 NOTE — Telephone Encounter (Signed)
Patient called to let Dr. Corliss Skains know that the reason she rescheduled her appointment to 07/27/19 was due to her new insurance.  Patient states under her new [employer] plan (same member ID #) for specialists patient must meet $7,500 deductible before insurance will pay.     Patient is also requesting a return call to discuss the cost of her Plaquenil which is $51 per month.

## 2019-06-29 NOTE — Telephone Encounter (Signed)
I returned patient's call and discussed that she can look on good Rx site to get the reduction and the cost of hydroxychloroquine.  Patient will try looking at the options available on good Rx.

## 2019-06-30 ENCOUNTER — Ambulatory Visit: Payer: BC Managed Care – PPO | Admitting: Physician Assistant

## 2019-06-30 NOTE — Telephone Encounter (Signed)
Reviewed grants and there are currently no grants available for the treatment of autoimmune disease.  I agree that best option would be good Rx.   Verlin Fester, PharmD, [place], CPP Clinical Specialty Pharmacist [phone]  06/30/2019 2:42 PM

## 2019-07-05 ENCOUNTER — Ambulatory Visit (INDEPENDENT_AMBULATORY_CARE_PROVIDER_SITE_OTHER): Payer: Self-pay | Admitting: *Deleted

## 2019-07-05 ENCOUNTER — Other Ambulatory Visit: Payer: Self-pay

## 2019-07-05 DIAGNOSIS — G43711 Chronic migraine without aura, intractable, with status migrainosus: Secondary | ICD-10-CM

## 2019-07-05 DIAGNOSIS — Z0289 Encounter for other administrative examinations: Secondary | ICD-10-CM

## 2019-07-05 MED ORDER — AJOVY 225 MG/1.5ML ~~LOC~~ SOAJ: 225.0000 mg | SUBCUTANEOUS | 0 refills | Status: DC

## 2019-07-05 NOTE — Telephone Encounter (Signed)
Ajovy 225 mg pen order placed in chart. Printed copy of med list for pt.

## 2019-07-05 NOTE — Progress Notes (Signed)
Pt came by office to pick up one Ajovy 225 mg sample pen per v.o. Dr. Lucia Gaskins. Pt reported this month's dose was unsuccessful. She will have her husband give this injection. Pt's next dose will be due in 30 days. Her questions were answered.

## 2019-07-19 ENCOUNTER — Other Ambulatory Visit: Payer: Self-pay | Admitting: Psychiatry

## 2019-07-19 DIAGNOSIS — F411 Generalized anxiety disorder: Secondary | ICD-10-CM

## 2019-07-19 NOTE — Telephone Encounter (Signed)
Last apt 01/2019, overdue for follow up

## 2019-07-20 NOTE — Progress Notes (Deleted)
Office Visit Note  Patient: Jennifer Vincent             Date of Birth: 1990-07-04           MRN: [id number]             PCP: Caren Macadam, MD Referring: Caren Macadam, MD Visit Date: 07/27/2019 Occupation: @GUAROCC @  Subjective:  No chief complaint on file.   History of Present Illness: Jennifer Vincent is a 29 y.o. female ***   Activities of Daily Living:  Patient reports morning stiffness for *** {minute/hour:19697}.   Patient {ACTIONS;DENIES/REPORTS:21021675::"Denies"} nocturnal pain.  Difficulty dressing/grooming: {ACTIONS;DENIES/REPORTS:21021675::"Denies"} Difficulty climbing stairs: {ACTIONS;DENIES/REPORTS:21021675::"Denies"} Difficulty getting out of chair: {ACTIONS;DENIES/REPORTS:21021675::"Denies"} Difficulty using hands for taps, buttons, cutlery, and/or writing: {ACTIONS;DENIES/REPORTS:21021675::"Denies"}  No Rheumatology ROS completed.   PMFS History:  Patient Active Problem List   Diagnosis Date Noted  . Chronic migraine without aura, with intractable migraine, so stated, with status migrainosus 06/01/2019  . Pregnant 07/03/2018  . Generalized anxiety disorder 01/11/2018  . Panic disorder 10/04/2016  . Anxiety 10/04/2016    Past Medical History:  Diagnosis Date  . Allergic rhinitis   . Anxiety   . Autoimmune disorder ([place])    nonspecified  . Frequent headaches   . IBS (irritable bowel syndrome)   . Migraine   . PCOS (polycystic ovarian syndrome)     Family History  Problem Relation Age of Onset  . Depression Mother   . Eating disorder Mother   . Arthritis Father   . Crohn's disease Father   . Ulcerative colitis Father   . Arthritis Brother   . Alcohol abuse Brother   . Mental illness Brother   . Depression Brother   . Anxiety disorder Brother   . Crohn's disease Brother   . Mental illness Maternal Grandfather   . Anxiety disorder Maternal Grandfather   . OCD Maternal Grandfather   . Heart disease Paternal Grandfather   . Diabetes  Paternal Grandfather   . Heart attack Paternal Grandfather   . Breast cancer Maternal Grandmother    Past Surgical History:  Procedure Laterality Date  . 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 Influenza-Unspecified 12/16/2016, 12/25/2017, 12/16/2018  . PFIZER SARS-COV-2 Vaccination 05/13/2019, 06/02/2019  . PPD Test 10/13/2014     Objective: Vital Signs: There were no vitals taken for this visit.   Physical Exam   Musculoskeletal Exam: ***  CDAI Exam: CDAI Score: -- Patient Global: --; Provider Global: -- Swollen: --; Tender: -- Joint Exam 07/27/2019   No joint exam has been documented for this visit   There is currently no information documented on the homunculus. Go to the Rheumatology activity and complete the homunculus joint exam.  Investigation: No additional findings.  Imaging: No results found.  Recent Labs: Lab Results  Component Value Date   WBC 5.6 03/23/2019   HGB 14.0 03/23/2019   PLT 245 03/23/2019   NA 139 03/23/2019   K 3.9 03/23/2019   CL 106 03/23/2019   CO2  24 03/23/2019   GLUCOSE 108 03/23/2019   BUN 9 03/23/2019   CREATININE 0.73 03/23/2019   BILITOT 0.4 03/23/2019   ALKPHOS 54 01/18/2019   AST 14 03/23/2019   ALT 11 03/23/2019   PROT 7.1 03/23/2019   ALBUMIN 4.7 01/18/2019   CALCIUM 9.3 03/23/2019   GFRAA 129 03/23/2019    Speciality Comments: PLQ eye exam: 03/03/2019 normal. [facility]. Follow up in 1 year.  Procedures:  No procedures performed Allergies: Lactose intolerance (gi)   Assessment / Plan:     Visit Diagnoses: No diagnosis found.  Orders: No  orders of the defined types were placed in this encounter.  No orders of the defined types were placed in this encounter.   Face-to-face time spent with patient was *** minutes. Greater than 50% of time was spent in counseling and coordination of care.  Follow-Up Instructions: No follow-ups on file.   Gearldine Bienenstock, PA-C  Note - This record has been created using Dragon software.  Chart creation errors have been sought, but may not always  have been located. Such creation errors do not reflect on  the standard of medical care.,

## 2019-07-26 NOTE — Progress Notes (Signed)
Office Visit Note  Patient: Jennifer Vincent             Date of Birth: [birth year]           MRN: [id number]             PCP: Wynn Banker, MD Referring: Wynn Banker, MD Visit Date: 07/29/2019 Occupation: @GUAROCC @  Subjective:  Other (increased flares )   History of Present Illness: Jennifer Vincent is a 29 y.o. female with history of autoimmune disease.  She has been on Plaquenil since November 2020.  She had some relief for couple of months but recentlyShe has been having increased flares.  She continues to have pain and discomfort in her bilateral hands, bilateral elbows, bilateral knees and her bilateral ankles.  She brought a picture on her iPhone from June 20, 2019 where she had swelling in her left second and third finger.  She continues to have fatigue.  She states difficult for her to function after 4 PM.  She has been experiencing nocturnal pain.  She states she has been seeing Dr. June 22, 2019 and her headaches are much better.  She is planning to get pregnant in the near future.  Activities of Daily Living:  Patient reports morning stiffness for 0 minutes.   Patient Reports nocturnal pain.  Difficulty dressing/grooming: Denies Difficulty climbing stairs: Denies Difficulty getting out of chair: Denies Difficulty using hands for taps, buttons, cutlery, and/or writing: Reports  Review of Systems  Constitutional: Positive for fatigue. Negative for night sweats, weight gain and weight loss.  HENT: Negative for mouth sores, trouble swallowing, trouble swallowing, mouth dryness and nose dryness.   Eyes: Positive for dryness. Negative for pain, redness, itching and visual disturbance.  Respiratory: Negative for cough, shortness of breath and difficulty breathing.   Cardiovascular: Negative for chest pain, palpitations, hypertension, irregular heartbeat and swelling in legs/feet.  Gastrointestinal: Positive for constipation. Negative for blood in stool and diarrhea.  Endocrine:  Negative for increased urination.  Genitourinary: Negative for difficulty urinating, painful urination and vaginal dryness.  Musculoskeletal: Positive for arthralgias, joint pain, joint swelling and muscle tenderness. Negative for myalgias, muscle weakness, morning stiffness and myalgias.  Skin: Negative for color change, rash, hair loss, redness, skin tightness, ulcers and sensitivity to sunlight.  Allergic/Immunologic: Negative for susceptible to infections.  Neurological: Positive for headaches. Negative for dizziness, numbness, memory loss, night sweats and weakness.  Hematological: Negative for bruising/bleeding tendency and swollen glands.  Psychiatric/Behavioral: Negative for depressed mood, confusion and sleep disturbance. The patient is nervous/anxious.     PMFS History:  Patient Active Problem List   Diagnosis Date Noted  . Chronic migraine without aura, with intractable migraine, so stated, with status migrainosus 06/01/2019  . Pregnant 07/03/2018  . Generalized anxiety disorder 01/11/2018  . Panic disorder 10/04/2016  . Anxiety 10/04/2016    Past Medical History:  Diagnosis Date  . Allergic rhinitis   . Anxiety   . Autoimmune disorder ([facility])    nonspecified  . Frequent headaches   . IBS (irritable bowel syndrome)   . Migraine   . PCOS (polycystic ovarian syndrome)     Family History  Problem Relation Age of Onset  . Depression Mother   . Eating disorder Mother   . Arthritis Father   . Crohn's disease Father   . Ulcerative colitis Father   . Arthritis Brother   . Alcohol abuse Brother   . Mental illness Brother   . Depression Brother   . Anxiety disorder  Brother   . Crohn's disease Brother   . Mental illness Maternal Grandfather   . Anxiety disorder Maternal Grandfather   . OCD Maternal Grandfather   . Heart disease Paternal Grandfather   . Diabetes Paternal Grandfather   . Heart attack Paternal Grandfather   . Breast cancer Maternal Grandmother   . Healthy  Daughter    Past Surgical History:  Procedure Laterality Date  . 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 Influenza-Unspecified 12/16/2016, 12/25/2017, 12/16/2018  . PFIZER SARS-COV-2 Vaccination 05/13/2019, 06/02/2019  . PPD Test 10/13/2014     Objective: Vital Signs: BP 120/80 (BP Location: Left Arm, Patient Position: Sitting, Cuff Size: Normal)   Pulse 80   Resp 15   Ht 5\' 8"  (1.727 m)   Wt 203 lb (92.1 kg)   BMI 30.87 kg/m    Physical Exam Vitals and nursing note reviewed.  Constitutional:      Appearance: She is well-developed.  HENT:     Head: Normocephalic and atraumatic.  Eyes:     Conjunctiva/sclera: Conjunctivae normal.  Cardiovascular:     Rate and Rhythm: Normal rate and regular rhythm.     Heart sounds: Normal heart sounds.  Pulmonary:     Effort: Pulmonary effort is normal.     Breath sounds: Normal breath sounds.  Abdominal:     General: Bowel sounds are normal.     Palpations: Abdomen is soft.  Musculoskeletal:     Cervical back: Normal range of motion.  Lymphadenopathy:     Cervical: No cervical adenopathy.  Skin:    General: Skin is warm and dry.     Capillary Refill: Capillary refill takes less than 2 seconds.  Neurological:     Mental Status: She is alert and oriented to person, place, and time.  Psychiatric:        Behavior: Behavior normal.      Musculoskeletal Exam: C-spine thoracic and lumbar spine with good range of motion.  Shoulder joints, elbow joints, wrist joints, MCPs PIPs and DIPs with good range of motion with no synovitis.  She has tenderness on multiple joints as  described below.  Hip joints, knee joints, ankles, MTPs with good range of motion with no synovitis.  CDAI Exam: CDAI Score: 9.9  Patient Global: 6 mm; Provider Global: 3 mm Swollen: 0 ; Tender: 11  Joint Exam 07/29/2019      Right  Left  Wrist      Tender  MCP 1   Tender     MCP 3   Tender   Tender  MCP 4      Tender  PIP 3   Tender   Tender  PIP 4   Tender   Tender  Ankle   Tender   Tender     Investigation: No additional findings.  Imaging: No results found.  Recent Labs: Lab Results  Component Value Date   WBC 5.6 03/23/2019   HGB 14.0 03/23/2019   PLT 245 03/23/2019   NA 139  03/23/2019   K 3.9 03/23/2019   CL 106 03/23/2019   CO2 24 03/23/2019   GLUCOSE 108 03/23/2019   BUN 9 03/23/2019   CREATININE 0.73 03/23/2019   BILITOT 0.4 03/23/2019   ALKPHOS 54 01/18/2019   AST 14 03/23/2019   ALT 11 03/23/2019   PROT 7.1 03/23/2019   ALBUMIN 4.7 01/18/2019   CALCIUM 9.3 03/23/2019   GFRAA 129 03/23/2019    Speciality Comments: PLQ eye exam: 03/03/2019 normal. [facility]. Follow up in 1 year.  Procedures:  No procedures performed Allergies: Lactose intolerance (gi)   Assessment / Plan:     Visit Diagnoses: Autoimmune disease ([facility]) - Positive ANA, positive SCL 70, aCL 42, fatigue, inflammatory arthritis (pictures noted on the cell phone showed MCP, left ankle joint swelling and rash in the past.)  She states she did well for 2 months after starting Plaquenil but then the flares a started again.  She brought a picture on her cell phone today with her left hand swelling.  She states she has episodic increased pain, nocturnal pain and swelling.  She has difficulty making a fist every day.  She has been experiencing increased fatigue.  She denies any history of oral ulcers, nasal ulcers, malar rash, photosensitivity or Raynaud's phenomenon.  She continues to have fatigue.  We had detailed discussion regarding different treatment options.  Patient is planning pregnancy  in the near future.  Indications side effects contraindications of Imuran were discussed.  A handout was given and consent was taken.  We will obtain TPMT today.  If TPMT is normal we will start her on Imuran 50 mg p.o. daily and increase it to 100 mg p.o. daily if labs are stable.  She will need labs in 2 weeks x 2 and then every 2 months.  Patient is planning pregnancy.  I will obtain complete ENA panel.  She will continue Plaquenil.  High risk medication use - Plaquenil 200 mg 1 tablet by mouth twice daily. Baseline eye exam on 03/01/19.   Anticardiolipin antibody positive -we will check the antibody levels again today.  Pain of both elbows-she complains of ongoing pain in her bilateral elbows.  She also complains of pain in her hands, knee joints and ankles.  No synovitis was noted on examination today.  Chondromalacia of both patellae  Generalized anxiety disorder  Panic disorder  History of migraine-followed by neurology.  History of IBS  Family history of Crohn's disease  Family history of ulcerative colitis  Orders: Orders Placed This Encounter  Procedures  . CBC with Differential/Platelet  . COMPLETE METABOLIC PANEL WITH GFR  . Urinalysis, Routine w reflex microscopic  . ANA  . Anti-DNA antibody, double-stranded  . C3 and C4  . Sedimentation rate  . Anti-scleroderma antibody  . Cardiolipin antibodies, IgG, IgM, IgA  . Hepatitis panel, acute  . HIV Antibody (routine testing w rflx)  . QuantiFERON-TB Gold Plus  . Serum protein electrophoresis with reflex  . IgG, IgA, IgM  . Thiopurine methyltransferase(tpmt)rbc   No orders of the defined types were placed in this encounter.   Face-to-face time spent with patient was 30 minutes. Greater than 50% of time was spent in counseling and coordination of care.  Follow-Up Instructions: Return in about 6 weeks (around 09/09/2019) for Autoimmune disease.   Pollyann Savoy, MD  Note - This record has been created using  Animal nutritionist.  Chart creation errors have been sought, but may not always  have been located. Such creation  errors do not reflect on  the standard of medical care.

## 2019-07-27 ENCOUNTER — Ambulatory Visit: Payer: BC Managed Care – PPO | Admitting: Rheumatology

## 2019-07-27 NOTE — Progress Notes (Signed)
[facility]    Provider:  Dr Jaynee Eagles Requesting Provider: Caren Macadam, MD Primary Care Provider:  Caren Macadam, MD  CC:  Severe migraines  07/28/2019: In the last 2 months she has gone with 25 headache free days and she is so grateful. The Ajovy has helped tremendously. The other migraines have been better and easier to treat. Roselyn Meier helps but doesn't "kick it". The migraines that are the worst are around her menses. She is working with her OB for birth control. She has been taking the minipill continuosly but not working. She was just switched on Sunday to Lo-Loestrin continuously. We discussed she is going to get pregnant, Ajovy is contraindicated as are the cgrps, we can start botox.. After a long discussion, decided on botox, discussed risks in pregnancy, also want her to discuss with her obgyn, titrate off of zonegran.   Medications tried: topamax, maxalt, zoloft, imitrex, zonegran, propranolol, zofran,  Amitriptyline,flexeril  HPI:  Jennifer Vincent is a 29 y.o. female here as requested by Caren Macadam, MD for migraines. She is a patient of the [facility], Dr. Domingo Cocking. PMHx autoimmune disease, PCOS, frequent headache, anxiety.  I reviewed her chart for anything relevant to headaches, I do see that she was seen January 13, 2019 for migraine headaches, she had used Imitrex and Topamax which seemed to work well for her but Topamax made her feel jittery, for 2 days prior to this appointment she had a migraine headache with accompanied photophobia and phonophobia as well as nausea.  Motrin and Aleve without any improvement.  Headaches are affected by weather and stress.  Physical and neuro exam were unremarkable.  She was given Imitrex, naproxen, Zofran.  Unfortunately Dr. Kirstie Mirza notes are not available to me(we will request).   Severe migraine since she stopped breast-feeding at 6 months postpartum, onset of first menses.  She had migraines in  college and afterwards they were sporadic, managed with Excedrin, she would get headaches a couple times a week but nothing that ibuprofen could manage.  After she stopped breast-feeding and started cycle again there were nothing like she had before.  They are excruciating around her periods. She has been seeing Dr. Domingo Cocking. She has also seen a neurologist in [place] who started her on topamax. Prior it was manageable, once migraine a month. In her 42s she would get a few headaches a week but nothing severe. She had a great pregnancy. Now they are severe and crippling they are so severe. Every time she gets her period they are intense. But it happens at other times as well. She is having migraines all month worse during her period. She has daily headaches. They can start behind the ears, can be unilateral, pounding/throbbing/pulsating, can start behind the left eye and spreads, nausea, vomiting, light and sound sensitivity. Dr. Domingo Cocking started her on zonisamide up to 200 and not getting better. She had the nerve blocks. She tried maxalt and it helped but she would have rebound. She is waking up with headaches, they are worse positionally, she is having blurry vision.   Medications tried: topamax, maxalt, zoloft, imitrex, zonegran, propranolol, zofran,  Amitriptyline,flexeril  Reviewed notes, labs and imaging from outside physicians, which showed:  See above for notes review. Also cbc/cmp normal 03/23/2019. Will request dr [facility]  Review of Systems: Patient complains of symptoms per HPI as well as the following symptoms: severe headaches improved, planning for another child, depression. Pertinent negatives and positives  per HPI. All others negative   Social History   Socioeconomic History  . Marital status: Married    Spouse name: Not on file  . Number of children: 1  . Years of education: Not on file  . Highest education level: Master's degree (e.g., MA, MS,  MEng, MEd, MSW, MBA)  Occupational History  . Not on file  Tobacco Use  . Smoking status: Never Smoker  . Smokeless tobacco: Never Used  Substance and Sexual Activity  . Alcohol use: Not Currently    Comment: rarely; quit summer 2019 (pre-pregnancy)  . Drug use: Never  . Sexual activity: Yes    Birth control/protection: Pill  Other Topics Concern  . 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 Difficulty of Paying Living Expenses:   Food Insecurity:   . Worried About Programme researcher, broadcasting/film/video in the Last Year:   . Barista in the Last Year:   Transportation Needs:   . Freight forwarder (Medical):   Marland Kitchen Lack of Transportation (Non-Medical):   Physical Activity:   . Days of Exercise per Week:   . Minutes of Exercise per Session:   Stress:   . Feeling of Stress :   Social Connections:   . Frequency of Communication with Friends and Family:   . Frequency of Social Gatherings with Friends and Family:   . Attends Religious Services:   . Active Member of Clubs or Organizations:   . Attends Banker Meetings:   Marland Kitchen Marital Status:   Intimate Partner Violence:   . Fear of Current or Ex-Partner:   . Emotionally Abused:   Marland Kitchen Physically Abused:   . Sexually Abused:     Family History  Problem Relation Age of Onset  . Depression Mother   . Eating disorder Mother   . Arthritis Father   . Crohn's disease Father   . Ulcerative colitis Father   . Arthritis Brother   . Alcohol abuse Brother   . Mental illness Brother   . Depression Brother   . Anxiety disorder Brother   . Crohn's disease Brother   . Mental illness Maternal  Grandfather   . Anxiety disorder Maternal Grandfather   . OCD Maternal Grandfather   . Heart disease Paternal Grandfather   . Diabetes Paternal Grandfather   . Heart attack Paternal Grandfather   . Breast cancer Maternal Grandmother     Past Medical History:  Diagnosis Date  . Allergic rhinitis   . Anxiety   . Autoimmune disorder (HCC)    nonspecified  . Frequent headaches   . IBS (irritable bowel syndrome)   . Migraine   . PCOS (polycystic ovarian syndrome)     Patient Active Problem List   Diagnosis Date Noted  . Chronic migraine without aura, with intractable migraine, so stated, with status migrainosus 06/01/2019  . Pregnant 07/03/2018  . Generalized anxiety disorder 01/11/2018  . Panic disorder 10/04/2016  . Anxiety 10/04/2016    Past Surgical History:  Procedure Laterality  Date  . TONSILLECTOMY      Current Outpatient Medications  Medication Sig Dispense Refill  . busPIRone (BUSPAR) 15 MG tablet TAKE 2 TABLETS (30 MG TOTAL) BY MOUTH 2 (TWO) TIMES DAILY. 360 tablet 0  . Fremanezumab-vfrm (AJOVY) 225 MG/1.5ML SOAJ Inject 225 mg into the skin every 30 (thirty) days. 1 pen 0  . hydroxychloroquine (PLAQUENIL) 200 MG tablet TAKE 1 TABLET BY MOUTH TWICE A DAY 60 tablet 2  . LO LOESTRIN FE 1 MG-10 MCG / 10 MCG tablet Take 1 tablet by mouth daily.    . sertraline (ZOLOFT) 100 MG tablet TAKE 1 TABLET BY MOUTH EVERY DAY 90 tablet 0  . ALPRAZolam (XANAX) 0.5 MG tablet Take 1 tablet (0.5 mg total) by mouth 2 (two) times daily as needed for anxiety. 60 tablet 1  . Rimegepant Sulfate (NURTEC) 75 MG TBDP Take 75 mg by mouth daily as needed. For migraines. Take as close to onset of migraine as possible. One daily maximum. 10 tablet 6   No current facility-administered medications for this visit.    Allergies as of 07/28/2019 - Review Complete 07/28/2019  Allergen Reaction Noted  . Lactose intolerance (gi)  01/18/2019    Vitals: BP 108/74 (BP Location: Right Arm, Patient  Position: Sitting)   Pulse 88   Temp (!) 97 F (36.1 C) Comment: taken at front  Ht 5\' 8"  (1.727 m)   Wt 199 lb (90.3 kg)   Breastfeeding No   BMI 30.26 kg/m  Last Weight:  Wt Readings from Last 1 Encounters:  07/28/19 199 lb (90.3 kg)   Last Height:   Ht Readings from Last 1 Encounters:  07/28/19 5\' 8"  (1.727 m)     Physical exam: Exam: Gen: NAD, conversant, well nourised, well groomed                     CV: RRR, no MRG. No Carotid Bruits. No peripheral edema, warm, nontender Eyes: Conjunctivae clear without exudates or hemorrhage  Neuro: Detailed Neurologic Exam  Speech:    Speech is normal; fluent and spontaneous with normal comprehension.  Cognition:    The patient is oriented to person, place, and time;     recent and remote memory intact;     language fluent;     normal attention, concentration,     fund of knowledge Cranial Nerves:    The pupils are equal, round, and reactive to light. The fundi are normal and spontaneous venous pulsations are present. Visual fields are full to finger confrontation. Extraocular movements are intact. Trigeminal sensation is intact and the muscles of mastication are normal. The face is symmetric. The palate elevates in the midline. Hearing intact. Voice is normal. Shoulder shrug is normal. The tongue has normal motion without fasciculations.   Coordination:    No dysmetria.   Gait:  normal native gait  Motor Observation:    No asymmetry, no atrophy, and no involuntary movements noted. Tone:    Normal muscle tone.    Posture:    Posture is normal. normal erect    Strength:    Strength is V/V in the upper and lower limbs.      Sensation: intact to LT     Reflex Exam:  DTR's:    Deep tendon reflexes in the upper and lower extremities are normal bilaterally.   Toes:    The toes are downgoing bilaterally.   Clonus:    Clonus is absent.    Assessment/Plan:  29  year old with chronic migraines, they are worse during  her menses but she is also having daily headaches and severe migraines throughout the month. Patient is really suffering,crying today, she is having severe headaches and she is a new mom. We could start Ajovy as this is the quickest medication we have, with daily oral meds we have to start low and titrate and this takes time, and patient is suffering and crying in the office. We will give her a medrol dosepak and Ubrelvy to bridge for a few days. We can discuss acute management and what we do during pregnancy at next appointment (they want to start trying later this year), we did discuss she has to be off of the [facility] for 5 months prior to trying to geting pregnant. Also need an MRI.   MRI of the brain was ordered and approved but not completed.  She has improved on Ajovy but having side effects and unfortunately Ajovy is contraindicated in pregnancy and she will have to stop it, we will try to get Botox approved. Talk to OBGYN about botox in pregnancy, we discussed the risks today and I provided literature for her.  Medications tried: topamax, maxalt, zoloft, imitrex, zonegran, propranolol, zofran,  Amitriptyline,flexeril, Ajovy (side effects to this class of medication)  Titrate off of zonegran  Try Nurtec acutely  Meds ordered this encounter  Medications  . Rimegepant Sulfate (NURTEC) 75 MG TBDP    Sig: Take 75 mg by mouth daily as needed. For migraines. Take as close to onset of migraine as possible. One daily maximum.    Dispense:  10 tablet    Refill:  6    Patient has copay card; she can have medication for $5 regardless of insurance approval or copay amount.  Marland Kitchen DISCONTD: zonisamide (ZONEGRAN) 25 MG capsule    Sig: Take 4 tablets at bedtime for one week, then 3 tablets at bedtie for one week, then 2 tablets at bedtime for one week, then one tablet at bedtime for one week then stop.    Dispense:  70 capsule    Refill:  0    Cc: Wynn Banker, MD  Naomie Dean, MD  [facility] [address] [place], [place] [address]  Phone [phone] Fax [phone]  I spent 30 minutes of face-to-face and non-face-to-face time with patient on the  1. Chronic migraine without aura, with intractable migraine, so stated, with status migrainosus    diagnosis.  This included previsit chart review, lab review, study review, order entry, electronic health record documentation, patient education on the different diagnostic and therapeutic options, counseling and coordination of care, risks and benefits of management, compliance, or risk factor reduction

## 2019-07-28 ENCOUNTER — Telehealth: Payer: Self-pay | Admitting: Neurology

## 2019-07-28 ENCOUNTER — Ambulatory Visit (INDEPENDENT_AMBULATORY_CARE_PROVIDER_SITE_OTHER): Payer: BC Managed Care – PPO | Admitting: Neurology

## 2019-07-28 ENCOUNTER — Encounter: Payer: Self-pay | Admitting: Neurology

## 2019-07-28 ENCOUNTER — Other Ambulatory Visit: Payer: Self-pay

## 2019-07-28 VITALS — BP 108/74 | HR 88 | Temp 97.0°F | Ht 68.0 in | Wt 199.0 lb

## 2019-07-28 DIAGNOSIS — G43711 Chronic migraine without aura, intractable, with status migrainosus: Secondary | ICD-10-CM

## 2019-07-28 MED ORDER — NURTEC 75 MG PO TBDP: 75.0000 mg | ORAL_TABLET | Freq: Every day | ORAL | 6 refills | Status: DC | PRN

## 2019-07-28 MED ORDER — ZONISAMIDE 25 MG PO CAPS: ORAL_CAPSULE | ORAL | 0 refills | Status: DC

## 2019-07-28 NOTE — Telephone Encounter (Signed)
Please initiate botox protocol and call patient to schedule. She has to be injected by Dr. Lucia Gaskins, thanks

## 2019-07-28 NOTE — Patient Instructions (Signed)
Botox nurtec  OnabotulinumtoxinA injection (Medical Use) What is this medicine? ONABOTULINUMTOXINA (o na BOTT you lye num tox in eh) is a neuro-muscular blocker. This medicine is used to treat crossed eyes, eyelid spasms, severe neck muscle spasms, ankle and toe muscle spasms, and elbow, wrist, and finger muscle spasms. It is also used to treat excessive underarm sweating, to prevent chronic migraine headaches, and to treat loss of bladder control due to neurologic conditions such as multiple sclerosis or spinal cord injury. This medicine may be used for other purposes; ask your health care provider or pharmacist if you have questions. COMMON BRAND NAME(S): Botox What should I tell my health care provider before I take this medicine? They need to know if you have any of these conditions:  breathing problems  cerebral palsy spasms  difficulty urinating  heart problems  history of surgery where this medicine is going to be used  infection at the site where this medicine is going to be used  myasthenia gravis or other neurologic disease  nerve or muscle disease  surgery plans  take medicines that treat or prevent blood clots  thyroid problems  an unusual or allergic reaction to botulinum toxin, albumin, other medicines, foods, dyes, or preservatives  pregnant or trying to get pregnant  breast-feeding How should I use this medicine? This medicine is for injection into a muscle. It is given by a health care professional in a hospital or clinic setting. Talk to your pediatrician regarding the use of this medicine in children. While this drug may be prescribed for children as young as 60 years old for selected conditions, precautions do apply. Overdosage: If you think you have taken too much of this medicine contact a poison control center or emergency room at once. NOTE: This medicine is only for you. Do not share this medicine with others. What if I miss a dose? This does not  apply. What may interact with this medicine?  aminoglycoside antibiotics like gentamicin, neomycin, tobramycin  muscle relaxants  other botulinum toxin injections This list may not describe all possible interactions. Give your health care provider a list of all the medicines, herbs, non-prescription drugs, or dietary supplements you use. Also tell them if you smoke, drink alcohol, or use illegal drugs. Some items may interact with your medicine. What should I watch for while using this medicine? Visit your doctor for regular check ups. This medicine will cause weakness in the muscle where it is injected. Tell your doctor if you feel unusually weak in other muscles. Get medical help right away if you have problems with breathing, swallowing, or talking. This medicine might make your eyelids droop or make you see blurry or double. If you have weak muscles or trouble seeing do not drive a car, use machinery, or do other dangerous activities. This medicine contains albumin from human blood. It may be possible to pass an infection in this medicine, but no cases have been reported. Talk to your doctor about the risks and benefits of this medicine. If your activities have been limited by your condition, go back to your regular routine slowly after treatment with this medicine. What side effects may I notice from receiving this medicine? Side effects that you should report to your doctor or health care professional as soon as possible:  allergic reactions like skin rash, itching or hives, swelling of the face, lips, or tongue  breathing problems  changes in vision  chest pain or tightness  eye irritation, pain  fast,  irregular heartbeat  infection  numbness  speech problems  swallowing problems  unusual weakness Side effects that usually do not require medical attention (report to your doctor or health care professional if they continue or are bothersome):  bruising or pain at site  where injected  drooping eyelid  dry eyes or mouth  headache  muscles aches, pains  sensitivity to light  tearing This list may not describe all possible side effects. Call your doctor for medical advice about side effects. You may report side effects to FDA at 1-800-FDA-1088. Where should I keep my medicine? This drug is given in a hospital or clinic and will not be stored at home. NOTE: This sheet is a summary. It may not cover all possible information. If you have questions about this medicine, talk to your doctor, pharmacist, or health care provider.  2020 Elsevier/Gold Standard (2017-09-08 14:21:42) Rimegepant oral dissolving tablet What is this medicine? RIMEGEPANT (ri ME je pant) is used to treat migraine headaches with or without aura. An aura is a strange feeling or visual disturbance that warns you of an attack. It is not used to prevent migraines. This medicine may be used for other purposes; ask your health care provider or pharmacist if you have questions. COMMON BRAND NAME(S): NURTEC ODT What should I tell my health care provider before I take this medicine? They need to know if you have any of these conditions:  kidney disease  liver disease  an unusual or allergic reaction to rimegepant, other medicines, foods, dyes, or preservatives  pregnant or trying to get pregnant  breast-feeding How should I use this medicine? Take the medicine by mouth. Follow the directions on the prescription label. Leave the tablet in the sealed blister pack until you are ready to take it. With dry hands, open the blister and gently remove the tablet. If the tablet breaks or crumbles, throw it away and take a new tablet out of the blister pack. Place the tablet in the mouth and allow it to dissolve, and then swallow. Do not cut, crush, or chew this medicine. You do not need water to take this medicine. Talk to your pediatrician about the use of this medicine in children. Special care  may be needed. Overdosage: If you think you have taken too much of this medicine contact a poison control center or emergency room at once. NOTE: This medicine is only for you. Do not share this medicine with others. What if I miss a dose? This does not apply. This medicine is not for regular use. What may interact with this medicine? This medicine may interact with the following medications:  certain medicines for fungal infections like fluconazole, itraconazole  rifampin This list may not describe all possible interactions. Give your health care provider a list of all the medicines, herbs, non-prescription drugs, or dietary supplements you use. Also tell them if you smoke, drink alcohol, or use illegal drugs. Some items may interact with your medicine. What should I watch for while using this medicine? Visit your health care professional for regular checks on your progress. Tell your health care professional if your symptoms do not start to get better or if they get worse. What side effects may I notice from receiving this medicine? Side effects that you should report to your doctor or health care professional as soon as possible:  allergic reactions like skin rash, itching or hives; swelling of the face, lips, or tongue Side effects that usually do not require medical attention (  report these to your doctor or health care professional if they continue or are bothersome):  nausea This list may not describe all possible side effects. Call your doctor for medical advice about side effects. You may report side effects to FDA at 1-800-FDA-1088. Where should I keep my medicine? Keep out of the reach of children. Store at room temperature between 15 and 30 degrees C (59 and 86 degrees F). Throw away any unused medicine after the expiration date. NOTE: This sheet is a summary. It may not cover all possible information. If you have questions about this medicine, talk to your doctor, pharmacist, or  health care provider.  2020 Elsevier/Gold Standard (2018-05-18 00:21:31)

## 2019-07-28 NOTE — Telephone Encounter (Signed)
Botox charge sheet completed. Pt will need to sign consent at first injection appointment.

## 2019-07-29 ENCOUNTER — Telehealth: Payer: Self-pay

## 2019-07-29 ENCOUNTER — Ambulatory Visit (INDEPENDENT_AMBULATORY_CARE_PROVIDER_SITE_OTHER): Payer: BC Managed Care – PPO | Admitting: Rheumatology

## 2019-07-29 ENCOUNTER — Encounter: Payer: Self-pay | Admitting: Rheumatology

## 2019-07-29 VITALS — BP 120/80 | HR 80 | Resp 15 | Ht 68.0 in | Wt 203.0 lb

## 2019-07-29 DIAGNOSIS — M25521 Pain in right elbow: Secondary | ICD-10-CM | POA: Diagnosis not present

## 2019-07-29 DIAGNOSIS — M359 Systemic involvement of connective tissue, unspecified: Secondary | ICD-10-CM | POA: Diagnosis not present

## 2019-07-29 DIAGNOSIS — F410 Panic disorder [episodic paroxysmal anxiety] without agoraphobia: Secondary | ICD-10-CM

## 2019-07-29 DIAGNOSIS — M25522 Pain in left elbow: Secondary | ICD-10-CM

## 2019-07-29 DIAGNOSIS — R760 Raised antibody titer: Secondary | ICD-10-CM

## 2019-07-29 DIAGNOSIS — Z8379 Family history of other diseases of the digestive system: Secondary | ICD-10-CM

## 2019-07-29 DIAGNOSIS — Z79899 Other long term (current) drug therapy: Secondary | ICD-10-CM

## 2019-07-29 DIAGNOSIS — M2241 Chondromalacia patellae, right knee: Secondary | ICD-10-CM

## 2019-07-29 DIAGNOSIS — Z8669 Personal history of other diseases of the nervous system and sense organs: Secondary | ICD-10-CM

## 2019-07-29 DIAGNOSIS — M2242 Chondromalacia patellae, left knee: Secondary | ICD-10-CM

## 2019-07-29 DIAGNOSIS — Z8719 Personal history of other diseases of the digestive system: Secondary | ICD-10-CM

## 2019-07-29 DIAGNOSIS — Z111 Encounter for screening for respiratory tuberculosis: Secondary | ICD-10-CM | POA: Diagnosis not present

## 2019-07-29 DIAGNOSIS — F411 Generalized anxiety disorder: Secondary | ICD-10-CM

## 2019-07-29 NOTE — Patient Instructions (Addendum)
Standing Labs We placed an order today for your standing lab work.    Please come back and get your standing labs in 2 weeks after starting Imuran x2 and then every 2 months  We have open lab daily Monday through Thursday from 8:30-12:30 PM and 1:30-4:30 PM and Friday from 8:30-12:30 PM and 1:30-4:00 PM at the office of Dr. Pollyann Savoy.   You may experience shorter wait times on Monday and Friday afternoons. The office is located at [address], [place], [place] [address] No appointment is necessary.   Labs are drawn by [facility].  You may receive a bill from [facility] for your lab work.  If you wish to have your labs drawn at another location, please call the office 24 hours in advance to send orders.  If you have any questions regarding directions or hours of operation,  please call [phone].   Just as a reminder please drink plenty of water prior to coming for your lab work. Thanks!    Azathioprine tablets What is this medicine? AZATHIOPRINE (ay za THYE oh preen) suppresses the immune system. It is used to prevent organ rejection after a transplant. It is also used to treat rheumatoid arthritis. This medicine may be used for other purposes; ask your health care provider or pharmacist if you have questions. COMMON BRAND NAME(S): Azasan, Imuran What should I tell my health care provider before I take this medicine? They need to know if you have any of these conditions:  infection  kidney disease  liver disease  an unusual or allergic reaction to azathioprine, other medicines, lactose, foods, dyes, or preservatives  pregnant or trying to get pregnant  breast feeding How should I use this medicine? Take this medicine by mouth with a full glass of water. Follow the directions on the prescription label. Take your medicine at regular intervals. Do not take your medicine more often than directed. Continue to take your medicine even if you feel better. Do not  stop taking except on your doctor's advice. Talk to your pediatrician regarding the use of this medicine in children. Special care may be needed. Overdosage: If you think you have taken too much of this medicine contact a poison control center or emergency room at once. NOTE: This medicine is only for you. Do not share this medicine with others. What if I miss a dose? If you miss a dose, take it as soon as you can. If it is almost time for your next dose, take only that dose. Do not take double or extra doses. What may interact with this medicine? Do not take this medicine with any of the following medications:  febuxostat  mercaptopurine This medicine may also interact with the following medications:  allopurinol  aminosalicylates like sulfasalazine, mesalamine, balsalazide, and olsalazine  leflunomide  medicines called ACE inhibitors like benazepril, captopril, enalapril, fosinopril, quinapril, lisinopril, ramipril, and trandolapril  mycophenolate  sulfamethoxazole; trimethoprim  vaccines  warfarin This list may not describe all possible interactions. Give your health care provider a list of all the medicines, herbs, non-prescription drugs, or dietary supplements you use. Also tell them if you smoke, drink alcohol, or use illegal drugs. Some items may interact with your medicine. What should I watch for while using this medicine? Visit your doctor or health care professional for regular checks on your progress. You will need frequent blood checks during the first few months you are receiving the medicine. If you get a cold or other infection while receiving  this medicine, call your doctor or health care professional. Do not treat yourself. The medicine may increase your risk of getting an infection. Women should inform their doctor if they wish to become pregnant or think they might be pregnant. There is a potential for serious side effects to an unborn child. Talk to your health  care professional or pharmacist for more information. Men may have a reduced sperm count while they are taking this medicine. Talk to your health care professional for more information. This medicine may increase your risk of getting certain kinds of cancer. Talk to your doctor about healthy lifestyle choices, important screenings, and your risk. What side effects may I notice from receiving this medicine? Side effects that you should report to your doctor or health care professional as soon as possible:  allergic reactions like skin rash, itching or hives, swelling of the face, lips, or tongue  changes in vision  confusion  fever, chills, or any other sign of infection  loss of balance or coordination  severe stomach pain  unusual bleeding, bruising  unusually weak or tired  vomiting  yellowing of the eyes or skin Side effects that usually do not require medical attention (report to your doctor or health care professional if they continue or are bothersome):  hair loss  nausea This list may not describe all possible side effects. Call your doctor for medical advice about side effects. You may report side effects to FDA at 1-800-FDA-1088. Where should I keep my medicine? Keep out of the reach of children. Store at room temperature between 15 and 25 degrees C (59 and 77 degrees F). Protect from light. Throw away any unused medicine after the expiration date. NOTE: This sheet is a summary. It may not cover all possible information. If you have questions about this medicine, talk to your doctor, pharmacist, or health care provider.  2020 Elsevier/Gold Standard (2013-06-29 12:00:31)

## 2019-07-29 NOTE — Telephone Encounter (Signed)
Per Dr. Corliss Skains, patient will be starting imuran pending labs.  If TPMT is normal we will start her on Imuran 50 mg p.o. daily and increase it to 100 mg p.o. daily if labs are stable.  She will need labs in 2 weeks x 2 and then every 2 months

## 2019-08-01 ENCOUNTER — Other Ambulatory Visit: Payer: Self-pay | Admitting: Psychiatry

## 2019-08-01 DIAGNOSIS — F411 Generalized anxiety disorder: Secondary | ICD-10-CM

## 2019-08-01 NOTE — Telephone Encounter (Signed)
Last apt 01/19/2019

## 2019-08-03 NOTE — Telephone Encounter (Signed)
In Process.

## 2019-08-04 ENCOUNTER — Telehealth: Payer: Self-pay | Admitting: Neurology

## 2019-08-04 NOTE — Telephone Encounter (Signed)
Patient has a Botox appointment on 7/6. I called [employer] [phone]) and spoke with Joen Laura, who states codes [phone] and [phone] are valid and billable. K0677 requires PA. Ref #[id number]. B/B. I called utilization management [phone]) to start PA. I spoke with Jonny Ruiz who will fax PA form to me.

## 2019-08-04 NOTE — Telephone Encounter (Signed)
Form signed by MD and faxed back to [facility] [phone]). Awaiting response.

## 2019-08-06 LAB — PROTEIN ELECTROPHORESIS, SERUM, WITH REFLEX
Albumin ELP: 4.8 g/dL (ref 3.8–4.8)
Alpha 1: 0.4 g/dL — ABNORMAL HIGH (ref 0.2–0.3)
Alpha 2: 0.9 g/dL (ref 0.5–0.9)
Beta 2: 0.4 g/dL (ref 0.2–0.5)
Beta Globulin: 0.4 g/dL (ref 0.4–0.6)
Gamma Globulin: 1.2 g/dL (ref 0.8–1.7)
Total Protein: 8 g/dL (ref 6.1–8.1)

## 2019-08-06 LAB — URINALYSIS, ROUTINE W REFLEX MICROSCOPIC
Bilirubin Urine: NEGATIVE
Glucose, UA: NEGATIVE
Hgb urine dipstick: NEGATIVE
Ketones, ur: NEGATIVE
Leukocytes,Ua: NEGATIVE
Nitrite: NEGATIVE
Protein, ur: NEGATIVE
Specific Gravity, Urine: 1.02 (ref 1.001–1.03)
pH: 7 (ref 5.0–8.0)

## 2019-08-06 LAB — QUANTIFERON-TB GOLD PLUS
Mitogen-NIL: >10 IU/mL
NIL: 0.01 IU/mL
QuantiFERON-TB Gold Plus: NEGATIVE
TB1-NIL: 0.01 IU/mL
TB2-NIL: 0.01 IU/mL

## 2019-08-06 LAB — COMPLETE METABOLIC PANEL WITH GFR
AG Ratio: 1.7 (calc) (ref 1.0–2.5)
ALT: 11 U/L (ref 6–29)
AST: 16 U/L (ref 10–30)
Albumin: 5 g/dL (ref 3.6–5.1)
Alkaline phosphatase (APISO): 49 U/L (ref 31–125)
BUN: 11 mg/dL (ref 7–25)
CO2: 26 mmol/L (ref 20–32)
Calcium: 9.5 mg/dL (ref 8.6–10.2)
Chloride: 105 mmol/L (ref 98–110)
Creat: 0.78 mg/dL (ref 0.50–1.10)
GFR, Est African American: 119 mL/min/{1.73_m2} (ref >=60)
GFR, Est Non African American: 103 mL/min/{1.73_m2} (ref >=60)
Globulin: 3 g/dL (calc) (ref 1.9–3.7)
Glucose, Bld: 85 mg/dL (ref 65–99)
Potassium: 3.7 mmol/L (ref 3.5–5.3)
Sodium: 139 mmol/L (ref 135–146)
Total Bilirubin: 0.4 mg/dL (ref 0.2–1.2)
Total Protein: 8 g/dL (ref 6.1–8.1)

## 2019-08-06 LAB — CBC WITH DIFFERENTIAL/PLATELET
Absolute Monocytes: 373 cells/uL (ref 200–950)
Basophils Absolute: 83 cells/uL (ref 0–200)
Basophils Relative: 1.2 %
Eosinophils Absolute: 48 cells/uL (ref 15–500)
Eosinophils Relative: 0.7 %
HCT: 42.1 % (ref 35.0–45.0)
Hemoglobin: 14.1 g/dL (ref 11.7–15.5)
Lymphs Abs: 1994 cells/uL (ref 850–3900)
MCH: 31.5 pg (ref 27.0–33.0)
MCHC: 33.5 g/dL (ref 32.0–36.0)
MCV: 94 fL (ref 80.0–100.0)
MPV: 12.9 fL — ABNORMAL HIGH (ref 7.5–12.5)
Monocytes Relative: 5.4 %
Neutro Abs: 4402 cells/uL (ref 1500–7800)
Neutrophils Relative %: 63.8 %
Platelets: 255 10*3/uL (ref 140–400)
RBC: 4.48 10*6/uL (ref 3.80–5.10)
RDW: 12 % (ref 11.0–15.0)
Total Lymphocyte: 28.9 %
WBC: 6.9 10*3/uL (ref 3.8–10.8)

## 2019-08-06 LAB — HEPATITIS PANEL, ACUTE
Hep A IgM: NONREACTIVE
Hep B C IgM: NONREACTIVE
Hepatitis B Surface Ag: NONREACTIVE
Hepatitis C Ab: NONREACTIVE
SIGNAL TO CUT-OFF: 0.02 (ref <1.00)

## 2019-08-06 LAB — ANTI-SCLERODERMA ANTIBODY: Scleroderma (Scl-70) (ENA) Antibody, IgG: 1.4 AI — AB

## 2019-08-06 LAB — ANA: Anti Nuclear Antibody (ANA): NEGATIVE

## 2019-08-06 LAB — HIV ANTIBODY (ROUTINE TESTING W REFLEX): HIV 1&2 Ab, 4th Generation: NONREACTIVE

## 2019-08-06 LAB — ANTI-DNA ANTIBODY, DOUBLE-STRANDED: ds DNA Ab: 1 IU/mL

## 2019-08-06 LAB — CARDIOLIPIN ANTIBODIES, IGG, IGM, IGA
Anticardiolipin IgA: <11 [APL'U]
Anticardiolipin IgG: 28 [GPL'U] — ABNORMAL HIGH
Anticardiolipin IgM: <12 [MPL'U]

## 2019-08-06 LAB — THIOPURINE METHYLTRANSFERASE (TPMT), RBC: Thiopurine Methyltransferase, RBC: 15 nmol/hr/mL RBC

## 2019-08-06 LAB — C3 AND C4
C3 Complement: 131 mg/dL (ref 83–193)
C4 Complement: 46 mg/dL (ref 15–57)

## 2019-08-06 LAB — IGG, IGA, IGM
IgG (Immunoglobin G), Serum: 1262 mg/dL (ref 600–1640)
IgM, Serum: 90 mg/dL (ref 50–300)
Immunoglobulin A: 191 mg/dL (ref 47–310)

## 2019-08-06 LAB — SEDIMENTATION RATE: Sed Rate: 11 mm/h (ref 0–20)

## 2019-08-06 NOTE — Telephone Encounter (Signed)
Dr. Corliss Skains discussed labs with patient and she does not want to start imuran at this point.

## 2019-08-06 NOTE — Progress Notes (Signed)
I discussed all the lab results with patient.  She does not want to start on Imuran at this point.  She states she will think about it and will let us know.

## 2019-08-09 NOTE — Telephone Encounter (Signed)
Received approval for Botox 200 units for 2 visits. Ref #[id number]. Auth good from 08/04/19 to 02/03/20. B/B

## 2019-08-09 NOTE — Telephone Encounter (Signed)
I called Jennifer Vincent and let her know that her Botox was approved. Jennifer Vincent is requesting a sooner appointment than July 6th. I advised her that Dr. Lucia Gaskins is booked out until then, but will call her in case of any openings.

## 2019-08-17 ENCOUNTER — Other Ambulatory Visit: Payer: Self-pay | Admitting: Rheumatology

## 2019-08-17 DIAGNOSIS — M359 Systemic involvement of connective tissue, unspecified: Secondary | ICD-10-CM

## 2019-08-17 MED ORDER — UBRELVY 100 MG PO TABS: 100.0000 mg | ORAL_TABLET | ORAL | 5 refills | Status: DC | PRN

## 2019-08-17 NOTE — Addendum Note (Signed)
Addended by: Bertram Savin on: 08/17/2019 01:26 PM   Modules accepted: Orders

## 2019-08-17 NOTE — Telephone Encounter (Signed)
Last visit: 07/29/2019 Next Visit: 09/09/2019 Labs: 07/29/2019 CMP WNL, MPV 12.9 PLQ eye exam: 03/03/2019 normal.  Current Dose per office note on 07/29/2019: Plaquenil 200 mg 1 tablet by mouth twice daily.  Okay to refill per Dr. Corliss Skains

## 2019-08-18 ENCOUNTER — Other Ambulatory Visit: Payer: Self-pay | Admitting: Neurology

## 2019-08-26 NOTE — Progress Notes (Deleted)
Office Visit Note  Patient: Jennifer Vincent             Date of Birth: Apr 25, 1990           MRN: [id number]             PCP: Caren Macadam, MD Referring: Caren Macadam, MD Visit Date: 09/09/2019 Occupation: @GUAROCC @  Subjective:  No chief complaint on file.   History of Present Illness: [name] is a 29 y.o. female ***   Activities of Daily Living:  Patient reports morning stiffness for *** {minute/hour:19697}.   Patient {ACTIONS;DENIES/REPORTS:21021675::"Denies"} nocturnal pain.  Difficulty dressing/grooming: {ACTIONS;DENIES/REPORTS:21021675::"Denies"} Difficulty climbing stairs: {ACTIONS;DENIES/REPORTS:21021675::"Denies"} Difficulty getting out of chair: {ACTIONS;DENIES/REPORTS:21021675::"Denies"} Difficulty using hands for taps, buttons, cutlery, and/or writing: {ACTIONS;DENIES/REPORTS:21021675::"Denies"}  No Rheumatology ROS completed.   PMFS History:  Patient Active Problem List   Diagnosis Date Noted  . Chronic migraine without aura, with intractable migraine, so stated, with status migrainosus 06/01/2019  . Pregnant 07/03/2018  . Generalized anxiety disorder 01/11/2018  . Panic disorder 10/04/2016  . Anxiety 10/04/2016    Past Medical History:  Diagnosis Date  . Allergic rhinitis   . Anxiety   . Autoimmune disorder ([place])    nonspecified  . Frequent headaches   . IBS (irritable bowel syndrome)   . Migraine   . PCOS (polycystic ovarian syndrome)     Family History  Problem Relation Age of Onset  . Depression Mother   . Eating disorder Mother   . Arthritis Father   . Crohn's disease Father   . Ulcerative colitis Father   . Arthritis Brother   . Alcohol abuse Brother   . Mental illness Brother   . Depression Brother   . Anxiety disorder Brother   . Crohn's disease Brother   . Mental illness Maternal Grandfather   . Anxiety disorder Maternal Grandfather   . OCD Maternal Grandfather   . Heart disease Paternal Grandfather   . Diabetes  Paternal Grandfather   . Heart attack Paternal Grandfather   . Breast cancer Maternal Grandmother   . Healthy Daughter    Past Surgical History:  Procedure Laterality Date  . 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 Influenza-Unspecified 12/16/2016, 12/25/2017, 12/16/2018  . PFIZER SARS-COV-2 Vaccination 05/13/2019, 06/02/2019  . PPD Test 10/13/2014     Objective: Vital Signs: There were no vitals taken for this visit.   Physical Exam   Musculoskeletal Exam: ***  CDAI Exam: CDAI Score: -- Patient Global: --; Provider Global: -- Swollen: --; Tender: -- Joint Exam 09/09/2019   No joint exam has been documented for this visit   There is currently no information documented on the homunculus. Go to the Rheumatology activity and complete the homunculus joint exam.  Investigation: No additional findings.  Imaging: No results found.  Recent Labs: Lab Results  Component Value Date   WBC 6.9 07/29/2019   HGB 14.1 07/29/2019   PLT 255 07/29/2019   NA 139 07/29/2019   K 3.7 07/29/2019   CL  105 07/29/2019   CO2 26 07/29/2019   GLUCOSE 85 07/29/2019   BUN 11 07/29/2019   CREATININE 0.78 07/29/2019   BILITOT 0.4 07/29/2019   ALKPHOS 54 01/18/2019   AST 16 07/29/2019   ALT 11 07/29/2019   PROT 8.0 07/29/2019   PROT 8.0 07/29/2019   ALBUMIN 4.7 01/18/2019   CALCIUM 9.5 07/29/2019   GFRAA 119 07/29/2019   QFTBGOLDPLUS NEGATIVE 07/29/2019    Speciality Comments: PLQ eye exam: 03/03/2019 normal. [facility]. Follow up in 1 year.  Procedures:  No procedures performed Allergies: Lactose intolerance (gi)    Assessment / Plan:     Visit Diagnoses: No diagnosis found.  Orders: No orders of the defined types were placed in this encounter.  No orders of the defined types were placed in this encounter.   Face-to-face time spent with patient was *** minutes. Greater than 50% of time was spent in counseling and coordination of care.  Follow-Up Instructions: No follow-ups on file.   Ellen Henri, CMA  Note - This record has been created using Animal nutritionist.  Chart creation errors have been sought, but may not always  have been located. Such creation errors do not reflect on  the standard of medical care.

## 2019-08-26 NOTE — Telephone Encounter (Signed)
Began enrollment process for [facility].

## 2019-08-26 NOTE — Telephone Encounter (Signed)
I called patient and made her aware of the specialty pharmacy process. I advised her to be looking for their call. She verbalized understanding.

## 2019-08-31 NOTE — Telephone Encounter (Addendum)
I called [facility] to check status [phone]). I spoke with Victorino Dike. She states that the request is still in process because with major medical it can take a few days to review. I informed her that patient's appointment is on 6/29. She advised that she sent an e-mail to the review team with an urgent request. She advised me to check back in 24-48 hours.

## 2019-09-02 NOTE — Telephone Encounter (Signed)
I called [facility] to check status. I spoke with Jerene Dilling, who states that the order is currently in the financial department because patient is seeking assistance due to the cost. Jerene Dilling states that they will call to schedule delivery when ready.

## 2019-09-06 ENCOUNTER — Telehealth: Payer: Self-pay | Admitting: Rheumatology

## 2019-09-06 NOTE — Telephone Encounter (Signed)
Patient called requesting a return call to let her know if she can reschedule her 09/09/19 appointment.  Patient states that appointment was a 6 week follow-up after starting Imuran.  Patient states she has decided that she is not going to take the medication and isn't sure if she still needs that appointment or can reschedule to 3 months.

## 2019-09-06 NOTE — Telephone Encounter (Signed)
I spoke with the patient.  She states she has not had any flares and she decided not to take Imuran.  We will cancel her appointment on June 24 and reschedule in 3 months.  Please let the front office contact her regarding the follow-up appointment.

## 2019-09-07 NOTE — Telephone Encounter (Signed)
I called [facility] again to check status. I spoke with Lynden Ang, who states the order is still not ready. I advised that patient's appointment is on 6/29. She apologized and states that they are not certain if it will be ready in time for patient's appointment. Patient has high co-pay with the pharmacy, over 1100.00. I advised them to cancel the order. Patient will be B/B.

## 2019-09-09 ENCOUNTER — Ambulatory Visit: Payer: BC Managed Care – PPO | Admitting: Rheumatology

## 2019-09-14 ENCOUNTER — Ambulatory Visit (INDEPENDENT_AMBULATORY_CARE_PROVIDER_SITE_OTHER): Payer: BC Managed Care – PPO | Admitting: Neurology

## 2019-09-14 DIAGNOSIS — G43711 Chronic migraine without aura, intractable, with status migrainosus: Secondary | ICD-10-CM | POA: Diagnosis not present

## 2019-09-14 NOTE — Progress Notes (Addendum)
Botox consent signed Botox- 200 units x 1 vial Lot: [id number] Expiration: 05/2022 NDC: 6606-3016-01  Bacteriostatic 0.9% Sodium Chloride- 74mL total Lot: [id number] Expiration: 09/16/2019 NDC: 5732-2025-42  Dx: H06.237 B/B

## 2019-09-14 NOTE — Progress Notes (Signed)
Consent Form Botulism Toxin Injection For Chronic Migraine  09/14/2019: this is out first botox, +a - masseters please ak next time and we may include masseters. On ajvy she did great, . The Ajovy has helped tremendously. The other migraines have been better and easier to treat. [name] helps but doesn't "kick it". The migraines that are the worst are around her menses. She is working with her OB for birth control. She has been taking the minipill continuosly but not working. She was just switched on Sunday to Lo-Loestrin continuously and that is helping. We discussed she is going to get pregnant, Ajovy is contraindicated as are the cgrps, we can start botox.. After a long discussion, decided on botox, discussed risks in pregnancy, also want her to discuss with her obgyn, titrate off of zonegran. STOP AJOVY.    Medications tried: topamax, maxalt, zoloft, imitrex, zonegran, propranolol, zofran,  Amitriptyline,flexeril  Reviewed orally with patient, additionally signature is on file:  Botulism toxin has been approved by the Federal drug administration for treatment of chronic migraine. Botulism toxin does not cure chronic migraine and it may not be effective in some patients.  The administration of botulism toxin is accomplished by injecting a small amount of toxin into the muscles of the neck and head. Dosage must be titrated for each individual. Any benefits resulting from botulism toxin tend to wear off after 3 months with a repeat injection required if benefit is to be maintained. Injections are usually done every 3-4 months with maximum effect peak achieved by about 2 or 3 weeks. Botulism toxin is expensive and you should be sure of what costs you will incur resulting from the injection.  The side effects of botulism toxin use for chronic migraine may include:   -Transient, and usually mild, facial weakness with facial injections  -Transient, and usually mild, head or neck weakness with head/neck  injections  -Reduction or loss of forehead facial animation due to forehead muscle weakness  -Eyelid drooping  -Dry eye  -Pain at the site of injection or bruising at the site of injection  -Double vision  -Potential unknown long term risks  Contraindications: You should not have Botox if you are pregnant, nursing, allergic to albumin, have an infection, skin condition, or muscle weakness at the site of the injection, or have myasthenia gravis, Lambert-Eaton syndrome, or ALS.  It is also possible that as with any injection, there may be an allergic reaction or no effect from the medication. Reduced effectiveness after repeated injections is sometimes seen and rarely infection at the injection site may occur. All care will be taken to prevent these side effects. If therapy is given over a long time, atrophy and wasting in the muscle injected may occur. Occasionally the patient's become refractory to treatment because they develop antibodies to the toxin. In this event, therapy needs to be modified.  I have read the above information and consent to the administration of botulism toxin.    BOTOX PROCEDURE NOTE FOR MIGRAINE HEADACHE    Contraindications and precautions discussed with patient(above). Aseptic procedure was observed and patient tolerated procedure. Procedure performed by Dr. Artemio Aly  The condition has existed for more than 6 months, and pt does not have a diagnosis of ALS, Myasthenia Gravis or Lambert-Eaton Syndrome.  Risks and benefits of injections discussed and pt agrees to proceed with the procedure.  Written consent obtained  These injections are medically necessary. Pt  receives good benefits from these injections. These injections do not cause  sedations or hallucinations which the oral therapies may cause.  Description of procedure:  The patient was placed in a sitting position. The standard protocol was used for Botox as follows, with 5 units of Botox injected at each  site:   -Procerus muscle, midline injection  -Corrugator muscle, bilateral injection  -Frontalis muscle, bilateral injection, with 2 sites each side, medial injection was performed in the upper one third of the frontalis muscle, in the region vertical from the medial inferior edge of the superior orbital rim. The lateral injection was again in the upper one third of the forehead vertically above the lateral limbus of the cornea, 1.5 cm lateral to the medial injection site.  -Temporalis muscle injection, 4 sites, bilaterally. The first injection was 3 cm above the tragus of the ear, second injection site was 1.5 cm to 3 cm up from the first injection site in line with the tragus of the ear. The third injection site was 1.5-3 cm forward between the first 2 injection sites. The fourth injection site was 1.5 cm posterior to the second injection site.   -Occipitalis muscle injection, 3 sites, bilaterally. The first injection was done one half way between the occipital protuberance and the tip of the mastoid process behind the ear. The second injection site was done lateral and superior to the first, 1 fingerbreadth from the first injection. The third injection site was 1 fingerbreadth superiorly and medially from the first injection site.  -Cervical paraspinal muscle injection, 2 sites, bilateral knee first injection site was 1 cm from the midline of the cervical spine, 3 cm inferior to the lower border of the occipital protuberance. The second injection site was 1.5 cm superiorly and laterally to the first injection site.  -Trapezius muscle injection was performed at 3 sites, bilaterally. The first injection site was in the upper trapezius muscle halfway between the inflection point of the neck, and the acromion. The second injection site was one half way between the acromion and the first injection site. The third injection was done between the first injection site and the inflection point of the  neck.   Will return for repeat injection in 3 months.   200 units of Botox was used, any Botox not injected was wasted. The patient tolerated the procedure well, there were no complications of the above procedure.

## 2019-09-15 ENCOUNTER — Encounter: Payer: Self-pay | Admitting: *Deleted

## 2019-09-21 ENCOUNTER — Ambulatory Visit: Payer: BC Managed Care – PPO | Admitting: Neurology

## 2019-10-04 ENCOUNTER — Other Ambulatory Visit: Payer: Self-pay | Admitting: Psychiatry

## 2019-10-04 DIAGNOSIS — F411 Generalized anxiety disorder: Secondary | ICD-10-CM

## 2019-10-04 NOTE — Telephone Encounter (Signed)
Last apt 01/19/2019 was due back 6 weeks

## 2019-10-05 DIAGNOSIS — Z309 Encounter for contraceptive management, unspecified: Secondary | ICD-10-CM | POA: Diagnosis not present

## 2019-10-05 DIAGNOSIS — Z6831 Body mass index (BMI) 31.0-31.9, adult: Secondary | ICD-10-CM | POA: Diagnosis not present

## 2019-10-05 DIAGNOSIS — Z01419 Encounter for gynecological examination (general) (routine) without abnormal findings: Secondary | ICD-10-CM | POA: Diagnosis not present

## 2019-10-12 NOTE — Telephone Encounter (Signed)
I called the pt and LVM (ok per DPR) offering triptan or steroid dose pack or can discuss further tomorrow AM if we do not hear back from her before office closes today. Left office number in message. Also sent mychart message to pt.

## 2019-10-13 ENCOUNTER — Telehealth: Payer: Self-pay | Admitting: Neurology

## 2019-10-13 MED ORDER — METHYLPREDNISOLONE 4 MG PO TBPK: ORAL_TABLET | ORAL | 0 refills | Status: DC

## 2019-10-13 NOTE — Telephone Encounter (Signed)
Spoke with pt and discussed. She has had a dull, persistent headache x 5 days, is able to function. We discussed the options of Toradol and steroids. I also spoke with Dr. Lucia Gaskins. It was decided that pt would try the steroids. She has taken this before. She understands the instructions. Pt aware to take with food and may take each day's pills at one time each day with food and taper according to pack instructions over 6 days. Pharmacy confirmed. Pt verbalized appreciation for the call.   Medrol dose pack ordered per v.o. Dr. Lucia Gaskins.

## 2019-10-13 NOTE — Addendum Note (Signed)
Addended by: Bertram Savin on: 10/13/2019 05:17 PM   Modules accepted: Orders

## 2019-10-13 NOTE — Telephone Encounter (Signed)
Pt is asking for a call from [facility] re: steroid options

## 2019-10-18 ENCOUNTER — Ambulatory Visit (INDEPENDENT_AMBULATORY_CARE_PROVIDER_SITE_OTHER): Payer: BC Managed Care – PPO | Admitting: Family Medicine

## 2019-10-18 ENCOUNTER — Other Ambulatory Visit: Payer: Self-pay

## 2019-10-18 ENCOUNTER — Encounter: Payer: Self-pay | Admitting: Family Medicine

## 2019-10-18 VITALS — BP 124/78 | HR 94 | Temp 98.6°F

## 2019-10-18 DIAGNOSIS — H9203 Otalgia, bilateral: Secondary | ICD-10-CM | POA: Diagnosis not present

## 2019-10-18 NOTE — Progress Notes (Signed)
Established Patient Office Visit  Subjective:  Patient ID: Jennifer Vincent, female    DOB: 09/10/1990  Age: 29 y.o. MRN: [id number]  CC:  Chief Complaint  Patient presents with  . Ear Pain    in both ears started yesterday morning and feels like pressure     HPI Jennifer Vincent presents for bilateral earache symptoms.  Started yesterday morning.  Increase pressure sensation in both ears.  [place]  No [place].  No major altitude change.  She did little bit of swimming but has not noted any outer canal tenderness or drainage.  No hearing changes.  No vertigo.  No nasal congestion.  No fevers or chills.  No exacerbating or alleviating factors.  Past Medical History:  Diagnosis Date  . Allergic rhinitis   . Anxiety   . Autoimmune disorder (HCC)    nonspecified  . Frequent headaches   . IBS (irritable bowel syndrome)   . Migraine   . PCOS (polycystic ovarian syndrome)     Past Surgical History:  Procedure Laterality Date  . TONSILLECTOMY      Family History  Problem Relation Age of Onset  . Depression Mother   . Eating disorder Mother   . Arthritis Father   . Crohn's disease Father   . Ulcerative colitis Father   . Arthritis Brother   . Alcohol abuse Brother   . Mental illness Brother   . Depression Brother   . Anxiety disorder Brother   . Crohn's disease Brother   . Mental illness Maternal Grandfather   . Anxiety disorder Maternal Grandfather   . OCD Maternal Grandfather   . Heart disease Paternal Grandfather   . Diabetes Paternal Grandfather   . Heart attack Paternal Grandfather   . Breast cancer Maternal Grandmother   . Healthy Daughter     Social History   Socioeconomic History  . Marital status: Married    Spouse name: Not on file  . Number of children: 1  . Years of education: Not on file  . Highest education level: Master's degree (e.g., MA, MS, MEng, MEd, MSW, MBA)  Occupational History  . Not on file  Tobacco Use    . Smoking status: Never Smoker  . Smokeless tobacco: Never Used  Vaping Use  . Vaping Use: Never used  Substance and Sexual Activity  . Alcohol use: Yes    Comment: 1 monthly   . Drug use: Never  . Sexual activity: Yes    Birth control/protection: Pill  Other Topics Concern  . 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 Difficulty of Paying Living Expenses:   Food Insecurity:   . Worried About Programme researcher, broadcasting/film/video in the Last Year:   . Barista in the Last Year:   Transportation Needs:   . Freight forwarder (Medical):   Marland Kitchen Lack of Transportation (Non-Medical):   Physical Activity:   . Days of Exercise per Week:   . Minutes of Exercise  per Session:   Stress:   . Feeling of Stress :   Social Connections:   . Frequency of Communication with Friends and Family:   . Frequency of Social Gatherings with Friends and Family:   . Attends Religious Services:   . Active Member of Clubs or Organizations:   . Attends Banker Meetings:   Marland Kitchen Marital Status:   Intimate Partner Violence:   . Fear of Current or Ex-Partner:   . Emotionally Abused:   Marland Kitchen Physically Abused:   . Sexually Abused:     Outpatient Medications Prior to Visit  Medication Sig Dispense Refill  . busPIRone (BUSPAR) 15 MG tablet TAKE 1 TABLET (15 MG TOTAL) BY MOUTH 2 (TWO) TIMES DAILY. 180 tablet 0  . Fremanezumab-vfrm (AJOVY) 225 MG/1.5ML SOAJ Inject 225 mg into the skin every 30 (thirty) days. 1 pen 0  . hydroxychloroquine (PLAQUENIL) 200 MG tablet TAKE 1 TABLET BY MOUTH TWICE A DAY 60 tablet 2  . LO LOESTRIN FE 1 MG-10 MCG / 10 MCG tablet Take 1 tablet  by mouth daily.    . methylPREDNISolone (MEDROL DOSEPAK) 4 MG TBPK tablet Take the pills daily with food for 6 days 21 tablet 0  . sertraline (ZOLOFT) 100 MG tablet TAKE 1 TABLET BY MOUTH EVERY DAY 90 tablet 0  . Ubrogepant (UBRELVY) 100 MG TABS Take 100 mg by mouth every 2 (two) hours as needed. Maximum 200mg  a day. 10 tablet 5  . ALPRAZolam (XANAX) 0.5 MG tablet Take 1 tablet (0.5 mg total) by mouth 2 (two) times daily as needed for anxiety. 60 tablet 1   No facility-administered medications prior to visit.    Allergies  Allergen Reactions  . Lactose Intolerance (Gi)     GI upset    ROS Review of Systems  Constitutional: Negative for chills and fever.  HENT: Positive for ear pain. Negative for congestion, ear discharge, hearing loss and sore throat.   Neurological: Negative for headaches.      Objective:    Physical Exam Vitals reviewed.  Constitutional:      Appearance: Normal appearance.  HENT:     Ears:     Comments: Only minimal cerumen in both canals.  Eardrums appear normal with normal landmarks, gray color, no visible effusion.  There is no evidence for any external canal inflammation Cardiovascular:     Rate and Rhythm: Normal rate and regular rhythm.  Pulmonary:     Effort: Pulmonary effort is normal.     Breath sounds: Normal breath sounds.  Musculoskeletal:     Cervical back: Neck supple.  Lymphadenopathy:     Cervical: No cervical adenopathy.  Neurological:     Mental Status: She is alert.     BP 124/78 (BP Location: Left Arm, Patient Position: Sitting, Cuff Size: Normal)   Pulse 94   Temp 98.6 F (37 C) (Oral)   SpO2 97%  Wt Readings from Last 3 Encounters:  07/29/19 203 lb (92.1 kg)  07/28/19 199 lb (90.3 kg)  06/01/19 199 lb (90.3 kg)     Health Maintenance Due  Topic Date Due  . PAP SMEAR-Modifier  06/17/2019  . INFLUENZA VACCINE  10/17/2019    There are no preventive care reminders to display for this patient.  Lab Results    Component Value Date   TSH 0.81 01/18/2019   Lab Results  Component Value Date   WBC 6.9 07/29/2019   HGB 14.1 07/29/2019   HCT 42.1 07/29/2019   MCV  94.0 07/29/2019   PLT 255 07/29/2019   Lab Results  Component Value Date   NA 139 07/29/2019   K 3.7 07/29/2019   CO2 26 07/29/2019   GLUCOSE 85 07/29/2019   BUN 11 07/29/2019   CREATININE 0.78 07/29/2019   BILITOT 0.4 07/29/2019   ALKPHOS 54 01/18/2019   AST 16 07/29/2019   ALT 11 07/29/2019   PROT 8.0 07/29/2019   PROT 8.0 07/29/2019   ALBUMIN 4.7 01/18/2019   CALCIUM 9.5 07/29/2019   GFR 102.24 01/18/2019   No results found for: CHOL No results found for: HDL No results found for: LDLCALC No results found for: TRIG No results found for: CHOLHDL No results found for: HGDJ2E    Assessment & Plan:   Problem List Items Addressed This Visit    None    Visit Diagnoses    Earache symptoms, bilateral    -  Primary    She has basically normal exam with no evidence for serous effusion, otitis externa, or any suppurative otitis changes.  She does not describe any nasal congestive symptoms.  We discussed the fact this may be related to eustachian tube issues though she again does not have any sinus congestion  -Consider trial of Flonase and/or decongestant for any persistent symptoms -Follow-up for any persistent or worsening symptoms  No orders of the defined types were placed in this encounter.   Follow-up: No follow-ups on file.    Evelena Peat, MD

## 2019-10-18 NOTE — Patient Instructions (Signed)
Ear exam is normal at this time  If you develop any nasal congestion consider trial of OTC Flonase.

## 2019-10-24 ENCOUNTER — Other Ambulatory Visit: Payer: Self-pay | Admitting: Psychiatry

## 2019-10-24 DIAGNOSIS — F411 Generalized anxiety disorder: Secondary | ICD-10-CM

## 2019-10-25 DIAGNOSIS — R6884 Jaw pain: Secondary | ICD-10-CM | POA: Diagnosis not present

## 2019-10-25 NOTE — Telephone Encounter (Signed)
Last apt 01/2019 due back 6 weeks

## 2019-10-27 ENCOUNTER — Telehealth: Payer: Self-pay | Admitting: Neurology

## 2019-10-27 ENCOUNTER — Ambulatory Visit (INDEPENDENT_AMBULATORY_CARE_PROVIDER_SITE_OTHER): Payer: BC Managed Care – PPO | Admitting: Neurology

## 2019-10-27 DIAGNOSIS — R6884 Jaw pain: Secondary | ICD-10-CM | POA: Diagnosis not present

## 2019-10-27 DIAGNOSIS — F458 Other somatoform disorders: Secondary | ICD-10-CM | POA: Diagnosis not present

## 2019-10-27 DIAGNOSIS — G43711 Chronic migraine without aura, intractable, with status migrainosus: Secondary | ICD-10-CM

## 2019-10-27 NOTE — Progress Notes (Signed)
Xeomin- 100 units x 1 vial Lot: [id number] Expiration: 02/2021 NDC: 2761-4709-29  Bacteriostatic 0.9% Sodium Chloride- 45mL total Lot: [id number] Expiration: 12/16/2020 NDC: 0370-9643-83  sample

## 2019-10-27 NOTE — Progress Notes (Signed)
Patient is here for bruxism. She is having clenching at night, unclear why but it is worse and this week her jaw was extremely sore. She saw her pcp as well as her dentist. We discussed muscle relaxers at night and she does say that has helped. Also discussed bruxism is very common in migraineurs. Discussed NTI splint ay=t night. Today injected 7 units each masseter.  I spent 15 minutes of face-to-face and non-face-to-face time with patient on the  1. Bruxism   2. Jaw pain   3. Chronic migraine without aura, with intractable migraine, so stated, with status migrainosus    diagnosis.  This included previsit chart review, lab review, study review, order entry, electronic health record documentation, patient education on the different diagnostic and therapeutic options, counseling and coordination of care, risks and benefits of management, compliance, or risk factor reduction

## 2019-10-27 NOTE — Telephone Encounter (Signed)
Pt is needing upcoming Botox appointment on 10/05 to be rescheduled to a different day. Please advise at [phone].

## 2019-11-02 ENCOUNTER — Telehealth: Payer: Self-pay | Admitting: Neurology

## 2019-11-02 NOTE — Telephone Encounter (Signed)
Received a PA request for Ubrelvy 100mg  tablets. PA was submitted via . Key is [id number]. Per CMM, determination will be received within 72 hours.   Will check back later for determination.

## 2019-11-02 NOTE — Telephone Encounter (Signed)
I called the patient back. I LVM asking her to return my call.

## 2019-11-08 NOTE — Telephone Encounter (Signed)
Received notification from [facility] that Jennifer Vincent has been approved. Approved from 11/02/19 to 10/31/20. Will fax approval letter to patient's pharmacy.

## 2019-11-09 ENCOUNTER — Other Ambulatory Visit: Payer: BC Managed Care – PPO

## 2019-11-09 ENCOUNTER — Other Ambulatory Visit: Payer: Self-pay

## 2019-11-09 DIAGNOSIS — Z20822 Contact with and (suspected) exposure to covid-19: Secondary | ICD-10-CM | POA: Diagnosis not present

## 2019-11-11 ENCOUNTER — Other Ambulatory Visit: Payer: Self-pay

## 2019-11-11 ENCOUNTER — Other Ambulatory Visit: Payer: BC Managed Care – PPO

## 2019-11-11 DIAGNOSIS — Z20822 Contact with and (suspected) exposure to covid-19: Secondary | ICD-10-CM | POA: Diagnosis not present

## 2019-11-11 NOTE — Telephone Encounter (Signed)
We received the [place] approval letter via fax from [place]. I have faxed the approval letter to the pt's pharmacy. Received a receipt of confirmation.

## 2019-11-12 DIAGNOSIS — Z03818 Encounter for observation for suspected exposure to other biological agents ruled out: Secondary | ICD-10-CM | POA: Diagnosis not present

## 2019-11-12 DIAGNOSIS — Z20822 Contact with and (suspected) exposure to covid-19: Secondary | ICD-10-CM | POA: Diagnosis not present

## 2019-11-13 LAB — SARS-COV-2, NAA 2 DAY TAT

## 2019-11-13 LAB — NOVEL CORONAVIRUS, NAA: SARS-CoV-2, NAA: NOT DETECTED

## 2019-11-15 DIAGNOSIS — R05 Cough: Secondary | ICD-10-CM | POA: Diagnosis not present

## 2019-11-15 DIAGNOSIS — Z20828 Contact with and (suspected) exposure to other viral communicable diseases: Secondary | ICD-10-CM | POA: Diagnosis not present

## 2019-11-16 DIAGNOSIS — Z20828 Contact with and (suspected) exposure to other viral communicable diseases: Secondary | ICD-10-CM | POA: Diagnosis not present

## 2019-11-16 DIAGNOSIS — R05 Cough: Secondary | ICD-10-CM | POA: Diagnosis not present

## 2019-12-03 ENCOUNTER — Other Ambulatory Visit: Payer: Self-pay | Admitting: Rheumatology

## 2019-12-03 DIAGNOSIS — M359 Systemic involvement of connective tissue, unspecified: Secondary | ICD-10-CM

## 2019-12-03 NOTE — Telephone Encounter (Signed)
Last Visit: 07/29/2019 Next Visit: 12/23/2019 Labs: 07/29/2019 MPV 12.9 Eye exam: 03/03/2019  Current Dose per office note  On 07/29/2019: Plaquenil 200 mg 1 tablet by mouth twice daily DX: Autoimmune disease  Okay to refill Plaquenil?

## 2019-12-07 ENCOUNTER — Telehealth: Payer: Self-pay | Admitting: Neurology

## 2019-12-07 NOTE — Telephone Encounter (Signed)
That is fine with me.

## 2019-12-07 NOTE — Telephone Encounter (Signed)
I have previously tried to enroll patient with a specialty pharmacy. Due to the high cost of her copay ($1100), she remained B/B. Is it okay to continue having this patient be B/B?

## 2019-12-09 NOTE — Progress Notes (Deleted)
Office Visit Note  Patient: Jennifer Vincent             Date of Birth: 02/28/1991           MRN: [id number]             PCP: Wynn Banker, MD Referring: Wynn Banker, MD Visit Date: 12/23/2019 Occupation: @GUAROCC @  Subjective:  No chief complaint on file.   History of Present Illness: Jennifer Vincent is a 29 y.o. female ***   Activities of Daily Living:  Patient reports morning stiffness for *** {minute/hour:19697}.   Patient {ACTIONS;DENIES/REPORTS:21021675::"Denies"} nocturnal pain.  Difficulty dressing/grooming: {ACTIONS;DENIES/REPORTS:21021675::"Denies"} Difficulty climbing stairs: {ACTIONS;DENIES/REPORTS:21021675::"Denies"} Difficulty getting out of chair: {ACTIONS;DENIES/REPORTS:21021675::"Denies"} Difficulty using hands for taps, buttons, cutlery, and/or writing: {ACTIONS;DENIES/REPORTS:21021675::"Denies"}  No Rheumatology ROS completed.   PMFS History:  Patient Active Problem List   Diagnosis Date Noted  . Chronic migraine without aura, with intractable migraine, so stated, with status migrainosus 06/01/2019  . Pregnant 07/03/2018  . Generalized anxiety disorder 01/11/2018  . Panic disorder 10/04/2016  . Anxiety 10/04/2016    Past Medical History:  Diagnosis Date  . Allergic rhinitis   . Anxiety   . Autoimmune disorder (HCC)    nonspecified  . Frequent headaches   . IBS (irritable bowel syndrome)   . Migraine   . PCOS (polycystic ovarian syndrome)     Family History  Problem Relation Age of Onset  . Depression Mother   . Eating disorder Mother   . Arthritis Father   . Crohn's disease Father   . Ulcerative colitis Father   . Arthritis Brother   . Alcohol abuse Brother   . Mental illness Brother   . Depression Brother   . Anxiety disorder Brother   . Crohn's disease Brother   . Mental illness Maternal Grandfather   . Anxiety disorder Maternal Grandfather   . OCD Maternal Grandfather   . Heart disease Paternal Grandfather   . Diabetes  Paternal Grandfather   . Heart attack Paternal Grandfather   . Breast cancer Maternal Grandmother   . Healthy Daughter    Past Surgical History:  Procedure Laterality Date  . 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 Influenza-Unspecified 12/16/2016, 12/25/2017, 12/16/2018  . PFIZER SARS-COV-2 Vaccination 05/13/2019, 06/02/2019  . PPD Test 10/13/2014     Objective: Vital Signs: There were no vitals taken for this visit.   Physical Exam   Musculoskeletal Exam: ***  CDAI Exam: CDAI Score: -- Patient Global: --; Provider Global: -- Swollen: --; Tender: -- Joint Exam 12/23/2019   No joint exam has been documented for this visit   There is currently no information documented on the homunculus. Go to the Rheumatology activity and complete the homunculus joint exam.  Investigation: No additional findings.  Imaging: No results found.  Recent Labs: Lab Results  Component Value Date   WBC 6.9 07/29/2019   HGB 14.1 07/29/2019   PLT 255 07/29/2019   NA 139 07/29/2019   K 3.7 07/29/2019   CL  105 07/29/2019   CO2 26 07/29/2019   GLUCOSE 85 07/29/2019   BUN 11 07/29/2019   CREATININE 0.78 07/29/2019   BILITOT 0.4 07/29/2019   ALKPHOS 54 01/18/2019   AST 16 07/29/2019   ALT 11 07/29/2019   PROT 8.0 07/29/2019   PROT 8.0 07/29/2019   ALBUMIN 4.7 01/18/2019   CALCIUM 9.5 07/29/2019   GFRAA 119 07/29/2019   QFTBGOLDPLUS NEGATIVE 07/29/2019    Speciality Comments: PLQ eye exam: 03/03/2019 normal. [facility]. Follow up in 1 year.  Procedures:  No procedures performed Allergies: Lactose intolerance (gi)    Assessment / Plan:     Visit Diagnoses: No diagnosis found.  Orders: No orders of the defined types were placed in this encounter.  No orders of the defined types were placed in this encounter.   Face-to-face time spent with patient was *** minutes. Greater than 50% of time was spent in counseling and coordination of care.  Follow-Up Instructions: No follow-ups on file.   Gearldine Bienenstock, PA-C  Note - This record has been created using Dragon software.  Chart creation errors have been sought, but may not always  have been located. Such creation errors do not reflect on  the standard of medical care.

## 2019-12-13 ENCOUNTER — Telehealth: Payer: Self-pay | Admitting: Neurology

## 2019-12-13 NOTE — Telephone Encounter (Signed)
LVM for pt to cal back about scheduling mri  [employer] Auth: [id number] (exp. 12/13/19 to 06/09/20)

## 2019-12-13 NOTE — Telephone Encounter (Signed)
Done. thanks

## 2019-12-13 NOTE — Addendum Note (Signed)
Addended by: Naomie Dean B on: 12/13/2019 12:23 PM   Modules accepted: Orders

## 2019-12-13 NOTE — Telephone Encounter (Signed)
Patient called stated she wants to schedule her MRI that she had cancel in March.. when you get a chance can you put a new order in thank you!!

## 2019-12-15 NOTE — Progress Notes (Signed)
Consent Form Botulism Toxin Injection For Chronic Migraine  12/16/2019: +10u bid masseters.  Doing great. Trying to get pregnant. Loved the masseters. Only had 5 headaches the last 2 weeks whie wearing off which is fantastic for her and int he setting of wearing off is great. > 70% improvement in severity and frequency. +a - masseters please ak next time and we may include masseters. On ajvy she did great, . The Ajovy has helped tremendously. The other migraines have been better and easier to treat. [name] helps but doesn't "kick it". The migraines that are the worst are around her menses. She is working with her OB for birth control. She has been taking the minipill continuosly but not working. She was just switched on Sunday to Lo-Loestrin continuously and that is helping. We discussed she is going to get pregnant, Ajovy is contraindicated as are the cgrps, we can start botox.. After a long discussion, decided on botox, discussed risks in pregnancy, also want her to discuss with her obgyn, titrate off of zonegran. NOT ON AJOVY   Medications tried: topamax, maxalt, zoloft, imitrex, zonegran, propranolol, zofran,  Amitriptyline,flexeril  Reviewed orally with patient, additionally signature is on file:  Botulism toxin has been approved by the Federal drug administration for treatment of chronic migraine. Botulism toxin does not cure chronic migraine and it may not be effective in some patients.  The administration of botulism toxin is accomplished by injecting a small amount of toxin into the muscles of the neck and head. Dosage must be titrated for each individual. Any benefits resulting from botulism toxin tend to wear off after 3 months with a repeat injection required if benefit is to be maintained. Injections are usually done every 3-4 months with maximum effect peak achieved by about 2 or 3 weeks. Botulism toxin is expensive and you should be sure of what costs you will incur resulting from the  injection.  The side effects of botulism toxin use for chronic migraine may include:   -Transient, and usually mild, facial weakness with facial injections  -Transient, and usually mild, head or neck weakness with head/neck injections  -Reduction or loss of forehead facial animation due to forehead muscle weakness  -Eyelid drooping  -Dry eye  -Pain at the site of injection or bruising at the site of injection  -Double vision  -Potential unknown long term risks  Contraindications: You should not have Botox if you are pregnant, nursing, allergic to albumin, have an infection, skin condition, or muscle weakness at the site of the injection, or have myasthenia gravis, Lambert-Eaton syndrome, or ALS.  It is also possible that as with any injection, there may be an allergic reaction or no effect from the medication. Reduced effectiveness after repeated injections is sometimes seen and rarely infection at the injection site may occur. All care will be taken to prevent these side effects. If therapy is given over a long time, atrophy and wasting in the muscle injected may occur. Occasionally the patient's become refractory to treatment because they develop antibodies to the toxin. In this event, therapy needs to be modified.  I have read the above information and consent to the administration of botulism toxin.    BOTOX PROCEDURE NOTE FOR MIGRAINE HEADACHE    Contraindications and precautions discussed with patient(above). Aseptic procedure was observed and patient tolerated procedure. Procedure performed by Dr. Artemio Aly  The condition has existed for more than 6 months, and pt does not have a diagnosis of ALS, Myasthenia Gravis or  Lambert-Eaton Syndrome.  Risks and benefits of injections discussed and pt agrees to proceed with the procedure.  Written consent obtained  These injections are medically necessary. Pt  receives good benefits from these injections. These injections do not cause  sedations or hallucinations which the oral therapies may cause.  Description of procedure:  The patient was placed in a sitting position. The standard protocol was used for Botox as follows, with 5 units of Botox injected at each site:   -Procerus muscle, midline injection  -Corrugator muscle, bilateral injection  -Frontalis muscle, bilateral injection, with 2 sites each side, medial injection was performed in the upper one third of the frontalis muscle, in the region vertical from the medial inferior edge of the superior orbital rim. The lateral injection was again in the upper one third of the forehead vertically above the lateral limbus of the cornea, 1.5 cm lateral to the medial injection site.  -Temporalis muscle injection, 4 sites, bilaterally. The first injection was 3 cm above the tragus of the ear, second injection site was 1.5 cm to 3 cm up from the first injection site in line with the tragus of the ear. The third injection site was 1.5-3 cm forward between the first 2 injection sites. The fourth injection site was 1.5 cm posterior to the second injection site.   -Occipitalis muscle injection, 3 sites, bilaterally. The first injection was done one half way between the occipital protuberance and the tip of the mastoid process behind the ear. The second injection site was done lateral and superior to the first, 1 fingerbreadth from the first injection. The third injection site was 1 fingerbreadth superiorly and medially from the first injection site.  -Cervical paraspinal muscle injection, 2 sites, bilateral knee first injection site was 1 cm from the midline of the cervical spine, 3 cm inferior to the lower border of the occipital protuberance. The second injection site was 1.5 cm superiorly and laterally to the first injection site.  -Trapezius muscle injection was performed at 3 sites, bilaterally. The first injection site was in the upper trapezius muscle halfway between the  inflection point of the neck, and the acromion. The second injection site was one half way between the acromion and the first injection site. The third injection was done between the first injection site and the inflection point of the neck.   Will return for repeat injection in 3 months.   200 units of Botox was used, any Botox not injected was wasted. The patient tolerated the procedure well, there were no complications of the above procedure.

## 2019-12-16 ENCOUNTER — Other Ambulatory Visit: Payer: Self-pay

## 2019-12-16 ENCOUNTER — Ambulatory Visit (INDEPENDENT_AMBULATORY_CARE_PROVIDER_SITE_OTHER): Payer: BC Managed Care – PPO | Admitting: Neurology

## 2019-12-16 DIAGNOSIS — G43711 Chronic migraine without aura, intractable, with status migrainosus: Secondary | ICD-10-CM | POA: Diagnosis not present

## 2019-12-16 NOTE — Progress Notes (Signed)
Botox consent signed Botox- 200 units x 1 vial Lot: [id number] Expiration: 09/05/22 NDC: 6433-2951-88

## 2019-12-21 ENCOUNTER — Ambulatory Visit: Payer: BC Managed Care – PPO | Admitting: Neurology

## 2019-12-23 ENCOUNTER — Ambulatory Visit: Payer: BC Managed Care – PPO | Admitting: Rheumatology

## 2019-12-29 ENCOUNTER — Other Ambulatory Visit: Payer: Self-pay | Admitting: Psychiatry

## 2019-12-29 DIAGNOSIS — F411 Generalized anxiety disorder: Secondary | ICD-10-CM

## 2019-12-29 NOTE — Telephone Encounter (Signed)
Please review

## 2019-12-29 NOTE — Telephone Encounter (Signed)
Patient last seen 6/20. Did not follow up.

## 2019-12-30 NOTE — Telephone Encounter (Signed)
Please review

## 2020-01-04 ENCOUNTER — Other Ambulatory Visit: Payer: Self-pay | Admitting: Neurology

## 2020-01-13 ENCOUNTER — Other Ambulatory Visit: Payer: Self-pay | Admitting: Neurology

## 2020-01-13 MED ORDER — METHYLPREDNISOLONE 4 MG PO TBPK: ORAL_TABLET | ORAL | 1 refills | Status: DC

## 2020-01-17 ENCOUNTER — Other Ambulatory Visit: Payer: Self-pay | Admitting: Neurology

## 2020-01-17 MED ORDER — ALPRAZOLAM 0.25 MG PO TABS: ORAL_TABLET | ORAL | 0 refills | Status: DC

## 2020-01-17 NOTE — Telephone Encounter (Signed)
Called her in some xanax thanks

## 2020-01-17 NOTE — Telephone Encounter (Signed)
Patient returned my call. She is scheduled at [facility] for 01/25/20. She also informed me she is claustrophobic and would need something to help her. She is aware to have a driver.

## 2020-01-18 NOTE — Telephone Encounter (Signed)
Noted  

## 2020-01-25 ENCOUNTER — Ambulatory Visit (INDEPENDENT_AMBULATORY_CARE_PROVIDER_SITE_OTHER): Payer: BC Managed Care – PPO

## 2020-01-25 DIAGNOSIS — R519 Headache, unspecified: Secondary | ICD-10-CM | POA: Diagnosis not present

## 2020-01-25 DIAGNOSIS — H538 Other visual disturbances: Secondary | ICD-10-CM

## 2020-01-25 DIAGNOSIS — H539 Unspecified visual disturbance: Secondary | ICD-10-CM | POA: Diagnosis not present

## 2020-01-25 DIAGNOSIS — R51 Headache with orthostatic component, not elsewhere classified: Secondary | ICD-10-CM

## 2020-01-25 MED ORDER — GADOBENATE DIMEGLUMINE 529 MG/ML IV SOLN
20.0000 mL | Freq: Once | INTRAVENOUS | Status: AC | PRN
  Administered 2020-01-25: 20 mL via INTRAVENOUS

## 2020-01-29 ENCOUNTER — Other Ambulatory Visit: Payer: Self-pay | Admitting: Psychiatry

## 2020-01-29 DIAGNOSIS — F411 Generalized anxiety disorder: Secondary | ICD-10-CM

## 2020-02-01 ENCOUNTER — Encounter: Payer: Self-pay | Admitting: Adult Health

## 2020-02-01 ENCOUNTER — Ambulatory Visit (INDEPENDENT_AMBULATORY_CARE_PROVIDER_SITE_OTHER): Payer: BC Managed Care – PPO | Admitting: Adult Health

## 2020-02-01 ENCOUNTER — Other Ambulatory Visit: Payer: Self-pay

## 2020-02-01 VITALS — BP 126/82 | HR 84 | Ht 68.0 in | Wt 207.6 lb

## 2020-02-01 DIAGNOSIS — G43711 Chronic migraine without aura, intractable, with status migrainosus: Secondary | ICD-10-CM | POA: Diagnosis not present

## 2020-02-01 DIAGNOSIS — Z789 Other specified health status: Secondary | ICD-10-CM | POA: Diagnosis not present

## 2020-02-01 MED ORDER — METOCLOPRAMIDE HCL 10 MG PO TABS: 10.0000 mg | ORAL_TABLET | Freq: Four times a day (QID) | ORAL | 5 refills | Status: DC | PRN

## 2020-02-01 NOTE — Progress Notes (Addendum)
PATIENT: Jennifer Vincent DOB: Jun 07, 1990  REASON FOR VISIT: follow up HISTORY FROM: patient  HISTORY OF PRESENT ILLNESS: Today 02/01/20:  Jennifer Vincent is a 29 year old female with a history of migraine headaches.  She returns today to discuss medication options in pregnancy.  She states her and her husband are trying to conceive she has stopped all oral medication.  She states that since she has been off birth control and no longer taking Ajovy her headaches have increased significantly.  She is having about 4 migraines a week.  She states 1-2 headaches are mild she is having at least one severe headache a week.  With her severe headache she does have photophobia, phonophobia nausea and vomiting.  She is also getting Botox injections.  Her next injection is in January.  She returns today for follow-up.  HISTORY 07/28/2019: In the last 2 months she has gone with 25 headache free days and she is so grateful. The Ajovy has helped tremendously. The other migraines have been better and easier to treat. [name] helps but doesn't "kick it". The migraines that are the worst are around her menses. She is working with her OB for birth control. She has been taking the minipill continuosly but not working. She was just switched on Sunday to Lo-Loestrin continuously. We discussed she is going to get pregnant, Ajovy is contraindicated as are the cgrps, we can start botox.. After a long discussion, decided on botox, discussed risks in pregnancy, also want her to discuss with her obgyn, titrate off of zonegran.   Medications tried: topamax, maxalt, zoloft, imitrex, zonegran, propranolol, zofran,  Amitriptyline,flexeril  REVIEW OF SYSTEMS: Out of a complete 14 system review of symptoms, the patient complains only of the following symptoms, and all other reviewed systems are negative.  See HPI  ALLERGIES: Allergies  Allergen Reactions  . Lactose Intolerance (Gi)     GI upset    HOME MEDICATIONS: Outpatient  Medications Prior to Visit  Medication Sig Dispense Refill  . busPIRone (BUSPAR) 15 MG tablet TAKE 2 TABLETS (30 MG TOTAL) BY MOUTH 2 (TWO) TIMES DAILY. 120 tablet 0  . hydroxychloroquine (PLAQUENIL) 200 MG tablet TAKE 1 TABLET BY MOUTH TWICE A DAY 60 tablet 2  . sertraline (ZOLOFT) 100 MG tablet TAKE 1 TABLET BY MOUTH EVERY DAY 90 tablet 0  . ALPRAZolam (XANAX) 0.25 MG tablet Take 1-2 tabs (0.25mg -0.50mg ) 30-60 minutes before procedure. May repeat if needed.Do not drive. 4 tablet 0  . LO LOESTRIN FE 1 MG-10 MCG / 10 MCG tablet Take 1 tablet by mouth daily.    . methylPREDNISolone (MEDROL DOSEPAK) 4 MG TBPK tablet Take pills dail together with food for 6 days. 21 tablet 1  . Ubrogepant (UBRELVY) 100 MG TABS Take 100 mg by mouth every 2 (two) hours as needed. Maximum 200mg  a day. 10 tablet 5  . ALPRAZolam (XANAX) 0.5 MG tablet Take 1 tablet (0.5 mg total) by mouth 2 (two) times daily as needed for anxiety. 60 tablet 1   No facility-administered medications prior to visit.    PAST MEDICAL HISTORY: Past Medical History:  Diagnosis Date  . Allergic rhinitis   . Anxiety   . Autoimmune disorder (HCC)    nonspecified  . Frequent headaches   . IBS (irritable bowel syndrome)   . Migraine   . PCOS (polycystic ovarian syndrome)     PAST SURGICAL HISTORY: Past Surgical History:  Procedure Laterality Date  . TONSILLECTOMY      FAMILY HISTORY: Family  History  Problem Relation Age of Onset  . Depression Mother   . Eating disorder Mother   . Arthritis Father   . Crohn's disease Father   . Ulcerative colitis Father   . Arthritis Brother   . Alcohol abuse Brother   . Mental illness Brother   . Depression Brother   . Anxiety disorder Brother   . Crohn's disease Brother   . Mental illness Maternal Grandfather   . Anxiety disorder Maternal Grandfather   . OCD Maternal Grandfather   . Heart disease Paternal Grandfather   . Diabetes Paternal Grandfather   . Heart attack Paternal  Grandfather   . Breast cancer Maternal Grandmother   . Healthy Daughter     SOCIAL HISTORY: Social History   Socioeconomic History  . Marital status: Married    Spouse name: Not on file  . Number of children: 1  . Years of education: Not on file  . Highest education level: Master's degree (e.g., MA, MS, MEng, MEd, MSW, MBA)  Occupational History  . Not on file  Tobacco Use  . Smoking status: Never Smoker  . Smokeless tobacco: Never Used  Vaping Use  . Vaping Use: Never used  Substance and Sexual Activity  . Alcohol use: Yes    Comment: 1 monthly   . Drug use: Never  . Sexual activity: Yes    Birth control/protection: Pill  Other Topics Concern  . 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 Difficulty of Paying Living Expenses: Not on file  Food Insecurity:   . Worried About Programme researcher, broadcasting/film/video in the Last Year: Not on file  . Ran Out of Food in the Last Year: Not on file  Transportation Needs:   . Lack of Transportation (Medical): Not on file  . Lack of Transportation (Non-Medical): Not on file  Physical Activity:   . Days of Exercise per Week: Not on file  . Minutes of Exercise per Session: Not on file  Stress:   . Feeling of Stress : Not on file  Social Connections:   . Frequency of Communication with Friends and Family: Not on file  . Frequency of Social Gatherings with Friends and Family: Not on file  . Attends Religious Services: Not on file  . Active Member of Clubs or Organizations: Not on file  . Attends Banker Meetings: Not on file  . Marital Status: Not on file  Intimate Partner Violence:   . Fear  of Current or Ex-Partner: Not on file  . Emotionally Abused: Not on file  . Physically Abused: Not on file  . Sexually Abused: Not on file      PHYSICAL EXAM  Vitals:   02/01/20 0913  BP: 126/82  Pulse: 84  Weight: 207 lb 9.6 oz (94.2 kg)  Height: 5\' 8"  (1.727 m)   Body mass index is 31.57 kg/m.  Generalized: Well developed, in no acute distress   Neurological examination  Mentation: Alert oriented to time, place, history taking. Follows all  commands speech and language fluent Cranial nerve II-XII: Pupils were equal round reactive to light. Extraocular movements were full, visual field were full on confrontational test. Head turning and shoulder shrug  were normal and symmetric. Motor: The motor testing reveals 5 over 5 strength of all 4 extremities. Good symmetric motor tone is noted throughout.  Sensory: Sensory testing is intact to soft touch on all 4 extremities. No evidence of extinction is noted.  Coordination: Cerebellar testing reveals good finger-nose-finger and heel-to-shin bilaterally.  Gait and station: Gait is normal.     DIAGNOSTIC DATA (LABS, IMAGING, TESTING) - I reviewed patient records, labs, notes, testing and imaging myself where available.  Lab Results  Component Value Date   WBC 6.9 07/29/2019   HGB 14.1 07/29/2019   HCT 42.1 07/29/2019   MCV 94.0 07/29/2019   PLT 255 07/29/2019      Component Value Date/Time   NA 139 07/29/2019 1652   K 3.7 07/29/2019 1652   CL 105 07/29/2019 1652   CO2 26 07/29/2019 1652   GLUCOSE 85 07/29/2019 1652   BUN 11 07/29/2019 1652   CREATININE 0.78 07/29/2019 1652   CALCIUM 9.5 07/29/2019 1652   PROT 8.0 07/29/2019 1652   PROT 8.0 07/29/2019 1652   ALBUMIN 4.7 01/18/2019 1226   AST 16 07/29/2019 1652   ALT 11 07/29/2019 1652   ALKPHOS 54 01/18/2019 1226   BILITOT 0.4 07/29/2019 1652   GFRNONAA 103 07/29/2019 1652   GFRAA 119 07/29/2019 1652    Lab Results  Component Value Date   VITAMINB12 531  01/18/2019   Lab Results  Component Value Date   TSH 0.81 01/18/2019      ASSESSMENT AND PLAN 29 y.o. year old female  has a past medical history of Allergic rhinitis, Anxiety, Autoimmune disorder (HCC), Frequent headaches, IBS (irritable bowel syndrome), Migraine, and PCOS (polycystic ovarian syndrome). here with :  1.  Migraine headaches 2.  Trying to conceive  Advised patient that the safest option for prevention is magnesium 400 mg twice a day and riboflavin 400 mg daily.  She is amenable to trying this.  If this is not beneficial we discussed trying nortriptyline 10 mg daily.  Advised that she can use Reglan 10 mg every 6-8 hours when she gets a migraine.  I reviewed all risk associated with use of these medications in pregnancy.  Patient voiced understanding.  She will follow-up in January for Botox injections.   I spent 30 minutes of face-to-face and non-face-to-face time with patient.  This included previsit chart review, lab review, study review, order entry, electronic health record documentation, patient education.  Butch Penny, MSN, NP-C 02/01/2020, 9:51 AM [facility] [address], [address] [place], [place] [address] [phone]  Made any corrections needed, and agree with history, physical, neuro exam,assessment and plan as stated.     Naomie Dean, MD [facility]

## 2020-02-01 NOTE — Patient Instructions (Addendum)
Your Plan:  Try OTC Magnesium 400 mg twice a day, Riboflavin 400 mg daily Use Reglan when you get a headache If your symptoms worsen or you develop new symptoms please let us know.   Thank you for coming to see [place] at [facility]. I hope we have been able to provide you high quality care today.  You may receive a patient satisfaction survey over the next few weeks. We would appreciate your feedback and comments so that we may continue to improve ourselves and the health of our patients.

## 2020-02-11 ENCOUNTER — Other Ambulatory Visit: Payer: Self-pay | Admitting: Psychiatry

## 2020-02-11 DIAGNOSIS — F411 Generalized anxiety disorder: Secondary | ICD-10-CM

## 2020-02-29 ENCOUNTER — Telehealth: Payer: Self-pay | Admitting: Neurology

## 2020-02-29 DIAGNOSIS — N911 Secondary amenorrhea: Secondary | ICD-10-CM | POA: Diagnosis not present

## 2020-02-29 NOTE — Telephone Encounter (Signed)
Faxed signed PA to [facility] with notes.

## 2020-02-29 NOTE — Telephone Encounter (Signed)
Patient has a Botox appointment 03/20/20. Her PA with [employer] for Botox expired in November. I filled out new PA and gave to MD to sign.

## 2020-03-01 ENCOUNTER — Encounter: Payer: Self-pay | Admitting: Rheumatology

## 2020-03-01 DIAGNOSIS — J029 Acute pharyngitis, unspecified: Secondary | ICD-10-CM | POA: Diagnosis not present

## 2020-03-01 DIAGNOSIS — R0981 Nasal congestion: Secondary | ICD-10-CM | POA: Diagnosis not present

## 2020-03-01 DIAGNOSIS — M359 Systemic involvement of connective tissue, unspecified: Secondary | ICD-10-CM

## 2020-03-01 DIAGNOSIS — Z79899 Other long term (current) drug therapy: Secondary | ICD-10-CM

## 2020-03-01 DIAGNOSIS — B9689 Other specified bacterial agents as the cause of diseases classified elsewhere: Secondary | ICD-10-CM | POA: Diagnosis not present

## 2020-03-01 DIAGNOSIS — J018 Other acute sinusitis: Secondary | ICD-10-CM | POA: Diagnosis not present

## 2020-03-01 NOTE — Telephone Encounter (Signed)
It should be safe for her to continue Plaquenil during the pregnancy.  She has not been seen in the office since May 2021.  I would recommend obtaining labs and then come for the follow-up visit.  The labs should include CBC, CMP, UA, sed rate, complements, ANA and double-stranded DNA.

## 2020-03-02 NOTE — Telephone Encounter (Signed)
Received approval from [facility] for Botox. PA #[id number] (02/29/20- 02/27/21).

## 2020-03-03 ENCOUNTER — Other Ambulatory Visit: Payer: Self-pay | Admitting: Physician Assistant

## 2020-03-03 DIAGNOSIS — M359 Systemic involvement of connective tissue, unspecified: Secondary | ICD-10-CM

## 2020-03-03 NOTE — Telephone Encounter (Signed)
We do not have CBC and CMP in a long time.  Please advise patient to forward labs to [place] or she should come in the office to get CBC and CMP.

## 2020-03-03 NOTE — Telephone Encounter (Signed)
Please notify patient to get eye examination this month.

## 2020-03-03 NOTE — Telephone Encounter (Signed)
Last Visit: 07/29/2019 Next Visit: 12/23/2019 Labs: 07/29/2019 MPV 12.9 Eye exam: 03/03/2019 normal.   Current Dose per office note  On 07/29/2019: Plaquenil 200 mg 1 tablet by mouth twice daily DX: Autoimmune disease  Okay to refill Plaquenil?

## 2020-03-03 NOTE — Telephone Encounter (Signed)
Notified patient via my chart she is due to update her PLQ eye exam this month.

## 2020-03-03 NOTE — Telephone Encounter (Signed)
Notified patient to have OB fax most recent labs results.

## 2020-03-08 DIAGNOSIS — Z3685 Encounter for antenatal screening for Streptococcus B: Secondary | ICD-10-CM | POA: Diagnosis not present

## 2020-03-08 DIAGNOSIS — Z3A08 8 weeks gestation of pregnancy: Secondary | ICD-10-CM | POA: Diagnosis not present

## 2020-03-08 DIAGNOSIS — Z3481 Encounter for supervision of other normal pregnancy, first trimester: Secondary | ICD-10-CM | POA: Diagnosis not present

## 2020-03-08 DIAGNOSIS — O0991 Supervision of high risk pregnancy, unspecified, first trimester: Secondary | ICD-10-CM | POA: Diagnosis not present

## 2020-03-08 LAB — OB RESULTS CONSOLE HEPATITIS B SURFACE ANTIGEN: Hepatitis B Surface Ag: NEGATIVE

## 2020-03-08 LAB — OB RESULTS CONSOLE RUBELLA ANTIBODY, IGM: Rubella: IMMUNE

## 2020-03-08 LAB — OB RESULTS CONSOLE HIV ANTIBODY (ROUTINE TESTING): HIV: NONREACTIVE

## 2020-03-14 ENCOUNTER — Telehealth: Payer: Self-pay | Admitting: Psychiatry

## 2020-03-14 DIAGNOSIS — F411 Generalized anxiety disorder: Secondary | ICD-10-CM

## 2020-03-14 MED ORDER — SERTRALINE HCL 100 MG PO TABS
100.0000 mg | ORAL_TABLET | Freq: Every day | ORAL | 0 refills | Status: DC
Start: 2020-03-14 — End: 2020-05-02

## 2020-03-14 MED ORDER — BUSPIRONE HCL 15 MG PO TABS
ORAL_TABLET | ORAL | 0 refills | Status: DC
Start: 2020-03-14 — End: 2020-05-02

## 2020-03-14 NOTE — Telephone Encounter (Signed)
Refills sent

## 2020-03-14 NOTE — Telephone Encounter (Signed)
Pt called and made an appointment for 05/02/2020. She needs refills on her buspar and zoloft [facility] [place] [facility] [address]

## 2020-03-16 ENCOUNTER — Telehealth: Payer: Self-pay | Admitting: *Deleted

## 2020-03-16 NOTE — Telephone Encounter (Signed)
Labs received from [facility] on 03/08/2020 Reviewed by Hazel Sams, PA-C  Urine Culture, HBSAG Screen, Sed rate Antibody Screen, ANA, HCV Antibody, dsDNA, HIV, RPR, C3, C4, Rubella antiboies, Hgb Fraction Cascade, UA, CBC, CMP  Glucose 114 Creat. 0.56 Alk. Phos 39 ANA postive (no titer) UA: Ph 8.5 Protein 1+ RBC 3-10  Patient is currently pregnant and Dr. Estanislado Pandy advised patient on 03/01/2020 to continue PLQ.

## 2020-03-20 ENCOUNTER — Ambulatory Visit: Payer: BC Managed Care – PPO | Admitting: Neurology

## 2020-03-20 ENCOUNTER — Telehealth: Payer: Self-pay | Admitting: Neurology

## 2020-03-20 NOTE — Telephone Encounter (Signed)
Patient was scheduled for a Botox appointment today. Patient is currently pregnant and per RN note, patient would like to hold off on Botox for now.

## 2020-03-23 ENCOUNTER — Other Ambulatory Visit: Payer: Self-pay | Admitting: Obstetrics and Gynecology

## 2020-03-23 DIAGNOSIS — Z3A10 10 weeks gestation of pregnancy: Secondary | ICD-10-CM | POA: Diagnosis not present

## 2020-03-23 DIAGNOSIS — Z3481 Encounter for supervision of other normal pregnancy, first trimester: Secondary | ICD-10-CM | POA: Diagnosis not present

## 2020-03-23 DIAGNOSIS — Z331 Pregnant state, incidental: Secondary | ICD-10-CM | POA: Diagnosis not present

## 2020-03-23 DIAGNOSIS — Z113 Encounter for screening for infections with a predominantly sexual mode of transmission: Secondary | ICD-10-CM | POA: Diagnosis not present

## 2020-03-28 ENCOUNTER — Other Ambulatory Visit: Payer: Self-pay | Admitting: Obstetrics and Gynecology

## 2020-03-28 DIAGNOSIS — Z363 Encounter for antenatal screening for malformations: Secondary | ICD-10-CM

## 2020-03-28 DIAGNOSIS — Z3A13 13 weeks gestation of pregnancy: Secondary | ICD-10-CM

## 2020-03-28 LAB — OB RESULTS CONSOLE GC/CHLAMYDIA
Chlamydia: NEGATIVE
Gonorrhea: NEGATIVE

## 2020-03-30 LAB — HM PAP SMEAR

## 2020-04-05 DIAGNOSIS — U071 COVID-19: Secondary | ICD-10-CM

## 2020-04-05 HISTORY — DX: COVID-19: U07.1

## 2020-04-06 ENCOUNTER — Other Ambulatory Visit: Payer: Self-pay | Admitting: Rheumatology

## 2020-04-06 DIAGNOSIS — M359 Systemic involvement of connective tissue, unspecified: Secondary | ICD-10-CM

## 2020-04-06 NOTE — Telephone Encounter (Signed)
Please call the patient to see if she has had a recent PLQ eye exam.  If not please advise the patient to update PLQ eye exam ASAP.   Ok to refill 30 day supply of PLQ 200 mg 1 tablet by mouth twice daily.

## 2020-04-06 NOTE — Telephone Encounter (Signed)
Last Visit: 07/29/2019 Next Visit: Seth Bake left message for patient call office to schedule appt.  Labs: 03/08/2020, Glucose 114 Creat. 0.56 Alk. Phos 39 Eye exam: 12/16.2020 normal   Current Dose per office note 07/29/2019, Plaquenil 200 mg 1 tablet by mouth twice daily DX Autoimmune disease   Okay to refill Plaquenil?

## 2020-04-06 NOTE — Telephone Encounter (Signed)
LMOM for patient to call office to schedule f/u appt and fax PLQ eye exam to office.

## 2020-04-07 ENCOUNTER — Other Ambulatory Visit: Payer: Self-pay

## 2020-04-07 ENCOUNTER — Telehealth: Payer: Self-pay | Admitting: Family Medicine

## 2020-04-07 ENCOUNTER — Telehealth (INDEPENDENT_AMBULATORY_CARE_PROVIDER_SITE_OTHER): Payer: BC Managed Care – PPO | Admitting: Family Medicine

## 2020-04-07 DIAGNOSIS — U071 COVID-19: Secondary | ICD-10-CM | POA: Diagnosis not present

## 2020-04-07 NOTE — Telephone Encounter (Signed)
error 

## 2020-04-07 NOTE — Patient Instructions (Signed)
°  HOME CARE TIPS:  -please contact your obstetrician right away for recommendations regarding treatment in pregnancy  -it would be ok to use tylenol cautiously if needed for fevers, aches and pains and robitussin for cough if needed.  -can use nasal saline a few times per day if you have nasal congestion  -stay hydrated, drink plenty of fluids and eat small healthy meals - avoid dairy  -follow up with your doctor in 2-3 days unless improving and feeling better  -stay home while sick, except to seek medical care, and if you have COVID19 ideally it would be best to stay home for a full 10 days since the onset of symptoms PLUS one day of no fever and feeling better. Wear a good mask (such as N95 or KN95) if around others to reduce the risk of transmission. Will defer to employer for a sooner return to work if symptoms have resolved, it is greater than 5 days since the positive test and you can wear a high-quality, tight fitting mask such as N95 or KN95 at all times for an additional 5 days. Would also suggest COVID19 antigen testing is negative prior to return.   It was nice to meet you today, and I really hope you are feeling better soon. I help [facility] out with telemedicine visits on Tuesdays and Thursdays and am available for visits on those days. If you have any concerns or questions following this visit please schedule a follow up visit with your Primary Care doctor or seek care at a local urgent care clinic to avoid delays in care.    Seek in person care or schedule a follow up video visit promptly if your symptoms worsen, new concerns arise or you are not improving with treatment. Call 911 and/or seek emergency care if your symptoms are severe or life threatening.

## 2020-04-07 NOTE — Progress Notes (Signed)
Virtual Visit via Video Note  I connected with Jennifer Vincent  on 04/07/20 at  4:00 PM EST by a video enabled telemedicine application and verified that I am speaking with the correct person using two identifiers.  Location patient: home, [place] Location provider:work or home office Persons participating in the virtual visit: patient, provider  I discussed the limitations of evaluation and management by telemedicine and the availability of in person appointments. The patient expressed understanding and agreed to proceed.   HPI:  Acute telemedicine visit for  COVID19: -Onset: not sure, had nausea and vomiting 2 days ago and then headache yesterday -Symptoms include: body aches, chills, headache, fever (high of 100.7), mild cough -Denies: diarrhea, any increased SOB (has been mildly winded throughout pregnancy), CP, inability to eat/drink/get out of bed, abdominal pain, bleeding -Has tried:tylenol -Pertinent past medical history: history of migraines, pregnant, 12.[redacted] weeks pregnant, on plaquenil for autoimmune disorder - sees rheumatology -Pertinent medication allergies:  nkda -COVID-19 vaccine status: vaccinated and boosted  ROS: See pertinent positives and negatives per HPI.  Past Medical History:  Diagnosis Date   Allergic rhinitis    Anxiety    Autoimmune disorder (HCC)    nonspecified   Frequent headaches    IBS (irritable bowel syndrome)    Migraine    PCOS (polycystic ovarian syndrome)     Past Surgical History:  Procedure Laterality Date   TONSILLECTOMY       Current Outpatient Medications:    ALPRAZolam (XANAX) 0.5 MG tablet, Take 1 tablet (0.5 mg total) by mouth 2 (two) times daily as needed for anxiety., Disp: 60 tablet, Rfl: 1   busPIRone (BUSPAR) 15 MG tablet, TAKE 2 TABLETS (30 MG TOTAL) BY MOUTH 2 (TWO) TIMES DAILY., Disp: 180 tablet, Rfl: 0   hydroxychloroquine (PLAQUENIL) 200 MG tablet, TAKE 1 TABLET BY MOUTH TWICE A DAY, Disp: 60 tablet, Rfl: 0    metoCLOPramide (REGLAN) 10 MG tablet, Take 1 tablet (10 mg total) by mouth every 6 (six) hours as needed for nausea., Disp: 15 tablet, Rfl: 5   sertraline (ZOLOFT) 100 MG tablet, Take 1 tablet (100 mg total) by mouth daily., Disp: 90 tablet, Rfl: 0  EXAM:  VITALS per patient if applicable:  GENERAL: alert, oriented, appears well and in no acute distress  HEENT: atraumatic, conjunttiva clear, no obvious abnormalities on inspection of external nose and ears  NECK: normal movements of the head and neck  LUNGS: on inspection no signs of respiratory distress, breathing rate appears normal, no obvious gross SOB, gasping or wheezing  CV: no obvious cyanosis  MS: moves all visible extremities without noticeable abnormality  PSYCH/NEURO: pleasant and cooperative, no obvious depression or anxiety, speech and thought processing grossly intact  ASSESSMENT AND PLAN:  Discussed the following assessment and plan:  COVID-19  -we discussed possible serious and likely etiologies, options for evaluation and workup, limitations of telemedicine visit vs in person visit, treatment, treatment risks and precautions. Pt prefers to treat via telemedicine empirically rather than in person at this moment.  Discussed available treatment options for COVID.  She has many questions and I advised that she consult her obstetrician right away regarding.  Also, advised she discuss with her obstetrician other potential options for management with over-the-counter medications and symptomatic care, that would be safe during pregnancy.  Can use nasal saline, Tylenol cautiously and limited amounts if needed, Robitussin.  Advised staying hydrated.  Advised potential complications, isolation and precautions. Work/School slipped offered: declined Scheduled follow up with PCP offered:  Agrees to schedule follow-up if needed. Advised to seek prompt in person care if worsening, new symptoms arise, or if is not improving with  treatment. Discussed options for inperson care if PCP office not available.  She reports her obstetrician office did tell her that they have an urgent care that she can utilize as well if needed.    I discussed the assessment and treatment plan with the patient. The patient was provided an opportunity to ask questions and all were answered. The patient agreed with the plan and demonstrated an understanding of the instructions.     Terressa Koyanagi, DO

## 2020-04-12 ENCOUNTER — Ambulatory Visit: Payer: BC Managed Care – PPO

## 2020-04-14 ENCOUNTER — Encounter: Payer: Self-pay | Admitting: *Deleted

## 2020-04-14 DIAGNOSIS — Z3682 Encounter for antenatal screening for nuchal translucency: Secondary | ICD-10-CM | POA: Diagnosis not present

## 2020-04-14 DIAGNOSIS — Z3A13 13 weeks gestation of pregnancy: Secondary | ICD-10-CM | POA: Diagnosis not present

## 2020-04-14 DIAGNOSIS — Z3481 Encounter for supervision of other normal pregnancy, first trimester: Secondary | ICD-10-CM | POA: Diagnosis not present

## 2020-04-17 ENCOUNTER — Encounter: Payer: Self-pay | Admitting: *Deleted

## 2020-04-17 ENCOUNTER — Ambulatory Visit: Payer: BC Managed Care – PPO | Attending: Obstetrics and Gynecology

## 2020-04-17 ENCOUNTER — Ambulatory Visit: Payer: BC Managed Care – PPO | Admitting: *Deleted

## 2020-04-17 ENCOUNTER — Ambulatory Visit (HOSPITAL_BASED_OUTPATIENT_CLINIC_OR_DEPARTMENT_OTHER): Payer: BC Managed Care – PPO | Admitting: Obstetrics

## 2020-04-17 ENCOUNTER — Other Ambulatory Visit: Payer: Self-pay

## 2020-04-17 VITALS — BP 116/72 | HR 93

## 2020-04-17 DIAGNOSIS — R768 Other specified abnormal immunological findings in serum: Secondary | ICD-10-CM | POA: Insufficient documentation

## 2020-04-17 DIAGNOSIS — U071 COVID-19: Secondary | ICD-10-CM

## 2020-04-17 DIAGNOSIS — O98511 Other viral diseases complicating pregnancy, first trimester: Secondary | ICD-10-CM | POA: Diagnosis not present

## 2020-04-17 DIAGNOSIS — Z363 Encounter for antenatal screening for malformations: Secondary | ICD-10-CM | POA: Diagnosis not present

## 2020-04-17 DIAGNOSIS — O99111 Other diseases of the blood and blood-forming organs and certain disorders involving the immune mechanism complicating pregnancy, first trimester: Secondary | ICD-10-CM

## 2020-04-17 DIAGNOSIS — Z3A12 12 weeks gestation of pregnancy: Secondary | ICD-10-CM

## 2020-04-17 DIAGNOSIS — R760 Raised antibody titer: Secondary | ICD-10-CM | POA: Insufficient documentation

## 2020-04-17 DIAGNOSIS — O99891 Other specified diseases and conditions complicating pregnancy: Secondary | ICD-10-CM | POA: Diagnosis not present

## 2020-04-17 DIAGNOSIS — Z3A13 13 weeks gestation of pregnancy: Secondary | ICD-10-CM | POA: Diagnosis not present

## 2020-04-17 DIAGNOSIS — D899 Disorder involving the immune mechanism, unspecified: Secondary | ICD-10-CM

## 2020-04-17 NOTE — Progress Notes (Signed)
MFM Consult Note  Jennifer Vincent was seen in consultation today due to a nonspecific autoimmune disorder.  She reports that prior to her first pregnancy at around 2019, she started to experience joint pain in her arms, hands, and knees.  The symptoms worsened after her delivery and she was referred to a rheumatologist.  Her rheumatologist then started her on Plaquenil and her symptoms subsequently resolved.  She reports that she has screened positive twice  for the IgG anticardiolipin antibodies.  She denies any history of a prior thromboembolic event.  This is her second pregnancy.  Her first pregnancy resulted in a vaginal delivery of a full-term appropriately grown neonate.  She did not experience any complications following delivery.  She denies any history of miscarriages, stillbirths, or preeclampsia.  She has also screened positive for the antinuclear antibodies.  She reports that she is recovering from a recent COVID-19 infection.  She has been fully vaccinated against COVID-19 including the booster vaccine shot.  Her past medical history includes anxiety and migraine headaches.  Her past surgical history includes a tonsillectomy.   She reports that she had a cell free DNA test drawn about 3 days ago.  The results of that test are currently pending.  On today's ultrasound exam, a viable singleton intrauterine pregnancy was noted.  The crown-rump length measured today is consistent with her gestational age giving her an [facility] of October 16, 2020.   During our consultation today, the following were discussed:  Nonspecific autoimmune disease and pregnancy  The patient currently does not meet the criteria for a diagnosis of lupus.  She was reassured that women with nonspecific autoimmune diseases often will have a normal pregnancy outcome.  However, the increased risk of adverse pregnancy outcomes such as the development of preeclampsia, an indicated preterm birth, and fetal growth restriction was  discussed.  She should continue close follow-up with her rheumatologist throughout her pregnancy.  She should continue taking Plaquenil as recommended by her rheumatologist.  She reports that she has an eye exam scheduled next week.  The patient has screened negative for the [facility] and SSB antibodies in November 2020.  Therefore, her baby should not be at risk for neonatal lupus or a congenital heart block.  We will continue to follow her closely with monthly growth ultrasounds.  Weekly fetal testing should be started at around 32 weeks.  Delivery should probably occur at around 39 weeks or earlier should there be any complications.  Positive IgG anticardiolipin antibodies  As the patient does not have a history of a prior thromboembolic event or an adverse pregnancy outcome (recurrent miscarriages, IUFD, or early onset severe preeclampsia), she does not meet the criteria for the antiphospholipid antibody syndrome.  Therefore, anticoagulation prophylaxis with Lovenox is not recommended during her pregnancy.  The increased risk that she may develop a thromboembolic event or have an adverse pregnancy outcome due to these antibodies was discussed.  It may be reasonable for her to continue taking a daily baby aspirin throughout her pregnancy as it will not do any harm.  As the risk for a thromboembolic event is highest during the postpartum period especially if she is delivered via C-section, anticoagulation prophylaxis (with Lovenox) may be considered for 6 weeks postpartum should she be delivered via C-section.  Recent COVID-19 infection and pregnancy  As the patient has already been fully vaccinated and has received the booster shot, she was advised to continue with the routine preventative measures for COVID-19 transmission.  The small  association of a COVID-19 infection in pregnancy with stillbirths was discussed.  As she will already be receiving weekly fetal testing due to her nonspecific  autoimmune disorder starting at 32 weeks, we will hopefully be able to modify her risk for a stillbirth.  A detailed fetal anatomy scan has been scheduled in our office at around 19 weeks.  At the end of the consultation, the patient stated that all of her questions had been answered to her complete satisfaction.  We will continue to follow her closely with you throughout this pregnancy.    Thank you for referring this patient for a Maternal-Fetal Medicine consultation.    Total time spent in consultation: 45 minutes.  Recommendations:   Continue Plaquenil and daily baby aspirin throughout pregnancy No need for anticoagulation prophylaxis during pregnancy Detailed fetal anatomy scan Monthly growth ultrasounds Weekly fetal testing starting at 32 weeks Delivery at around 39 weeks Consider anticoagulation prophylaxis for 6 weeks postpartum should she be delivered via C-section Continue to follow-up with her rheumatologist during pregnancy

## 2020-04-18 ENCOUNTER — Other Ambulatory Visit: Payer: Self-pay | Admitting: *Deleted

## 2020-04-18 DIAGNOSIS — D8989 Other specified disorders involving the immune mechanism, not elsewhere classified: Secondary | ICD-10-CM

## 2020-04-19 ENCOUNTER — Other Ambulatory Visit: Payer: Self-pay | Admitting: Neurology

## 2020-04-19 MED ORDER — METOCLOPRAMIDE HCL 10 MG PO TABS: 10.0000 mg | ORAL_TABLET | Freq: Three times a day (TID) | ORAL | 5 refills | Status: DC | PRN

## 2020-04-25 NOTE — Telephone Encounter (Signed)
Ladona Ridgel has been in contact with patient. Looks like her insurance changed and also we have an opening for tomorrow but she was unable to come.

## 2020-05-02 ENCOUNTER — Encounter: Payer: Self-pay | Admitting: Psychiatry

## 2020-05-02 ENCOUNTER — Telehealth (INDEPENDENT_AMBULATORY_CARE_PROVIDER_SITE_OTHER): Payer: BC Managed Care – PPO | Admitting: Psychiatry

## 2020-05-02 ENCOUNTER — Telehealth: Payer: Self-pay | Admitting: Psychiatry

## 2020-05-02 DIAGNOSIS — F411 Generalized anxiety disorder: Secondary | ICD-10-CM | POA: Diagnosis not present

## 2020-05-02 MED ORDER — SERTRALINE HCL 100 MG PO TABS: 100.0000 mg | ORAL_TABLET | Freq: Every day | ORAL | 2 refills | Status: DC

## 2020-05-02 MED ORDER — BUSPIRONE HCL 15 MG PO TABS
ORAL_TABLET | ORAL | 2 refills | Status: DC
Start: 2020-05-02 — End: 2020-10-25

## 2020-05-02 NOTE — Telephone Encounter (Signed)
Ms. vedika, dumlao are scheduled for a virtual visit with your provider today.    Just as we do with appointments in the office, we must obtain your consent to participate.  Your consent will be active for this visit and any virtual visit you may have with one of our providers in the next 365 days.    If you have a MyChart account, I can also send a copy of this consent to you electronically.  All virtual visits are billed to your insurance company just like a traditional visit in the office.  As this is a virtual visit, video technology does not allow for your provider to perform a traditional examination.  This may limit your provider's ability to fully assess your condition.  If your provider identifies any concerns that need to be evaluated in person or the need to arrange testing such as labs, EKG, etc, we will make arrangements to do so.    Although advances in technology are sophisticated, we cannot ensure that it will always work on either your end or our end.  If the connection with a video visit is poor, we may have to switch to a telephone visit.  With either a video or telephone visit, we are not always able to ensure that we have a secure connection.   I need to obtain your verbal consent now.   Are you willing to proceed with your visit today?   Zain Bingman has provided verbal consent on 05/02/2020 for a virtual visit (video or telephone).   Corie Chiquito, PMHNP 05/02/2020  3:46 PM

## 2020-05-02 NOTE — Progress Notes (Signed)
Jennifer Vincent [id number] [birth year] 30 y.o.  Virtual Visit via Video Note  I connected with pt @ on 05/02/20 at  3:30 PM EST by a video enabled telemedicine application and verified that I am speaking with the correct person using two identifiers.   I discussed the limitations of evaluation and management by telemedicine and the availability of in person appointments. The patient expressed understanding and agreed to proceed.  I discussed the assessment and treatment plan with the patient. The patient was provided an opportunity to ask questions and all were answered. The patient agreed with the plan and demonstrated an understanding of the instructions.   The patient was advised to call back or seek an in-person evaluation if the symptoms worsen or if the condition fails to improve as anticipated.  I provided 30 minutes of non-face-to-face time during this encounter.  The patient was located at home.  The provider was located at [facility].   Corie Chiquito, PMHNP   Subjective:   Patient ID:  Jennifer Vincent is a 30 y.o. (DOB 07-12-1990) female.  Chief Complaint:  Chief Complaint  Patient presents with  . Follow-up    Anxiety    HPI Jennifer Vincent presents for follow-up of anxiety.  She reports, "I've been doing very well." She reports that she took that Sertraline and Buspar throughout her first pregnancy. She reports that she is now [redacted] weeks pregnant with their second child, a boy. She reports in October 2020 her anxiety worsened and started seeing a therapist regularly and this was helpful. Saw her for about 9 months and then had scheduling conflicts.   Reports that anxiety has been manageable. Takes Buspar 15 mg one and 1/3 tablets (20 mg) twice daily. She reports that she has not had any recent panic attacks. Denies depressed mood. Sleeping well. Appetite has been good and improving from first trimester. Energy and motivation have been good. Concentration has been fine. Denies SI.    Has not needed Xanax prn recently.   She reports that she is enjoying her job and daughter is there in preschool.   Past Psychiatric Medication Trials: Ativan Xanax Klonopin Sertraline-effective at 100 mg. Increased anxiety at 150 mg daily BuSpar-effective  Review of Systems:  Review of Systems  Musculoskeletal: Negative for gait problem.  Neurological: Negative for tremors.       Improved headaches and migraines during pregnancy  Psychiatric/Behavioral:       Please refer to HPI    Has been dx'd with a non-specific autoimmune d/o. Reports that Plaquenil has been helpful for joint pain.   Medications: I have reviewed the patient's current medications.  Current Outpatient Medications  Medication Sig Dispense Refill  . aspirin EC 81 MG tablet Take 81 mg by mouth daily. Swallow whole.    . hydroxychloroquine (PLAQUENIL) 200 MG tablet TAKE 1 TABLET BY MOUTH TWICE A DAY 60 tablet 0  . metoCLOPramide (REGLAN) 10 MG tablet Take 1 tablet (10 mg total) by mouth every 8 (eight) hours as needed. Also take for Migraines. 30 tablet 5  . Prenatal Vit-Fe Fumarate-FA (PRENATAL MULTIVITAMIN) TABS tablet Take 1 tablet by mouth daily at 12 noon.    Marland Kitchen ALPRAZolam (XANAX) 0.5 MG tablet Take 1 tablet (0.5 mg total) by mouth 2 (two) times daily as needed for anxiety. 60 tablet 1  . busPIRone (BUSPAR) 15 MG tablet TAKE 2 TABLETS (30 MG TOTAL) BY MOUTH 2 (TWO) TIMES DAILY. 180 tablet 2  . metoCLOPramide (REGLAN) 10 MG tablet Take 1  tablet (10 mg total) by mouth every 6 (six) hours as needed for nausea. (Patient not taking: Reported on 04/17/2020) 15 tablet 5  . sertraline (ZOLOFT) 100 MG tablet Take 1 tablet (100 mg total) by mouth daily. 90 tablet 2   No current facility-administered medications for this visit.    Medication Side Effects: None  Allergies:  Allergies  Allergen Reactions  . Lactose Intolerance (Gi)     GI upset    Past Medical History:  Diagnosis Date  . Allergic rhinitis    . Anxiety   . Autoimmune disorder (HCC)    nonspecified  . COVID 04/05/2020  . Frequent headaches   . IBS (irritable bowel syndrome)   . Migraine   . PCOS (polycystic ovarian syndrome)     Family History  Problem Relation Age of Onset  . Depression Mother   . Eating disorder Mother   . Arthritis Father   . Crohn's disease Father   . Ulcerative colitis Father   . Arthritis Brother   . Alcohol abuse Brother   . Mental illness Brother   . Depression Brother   . Anxiety disorder Brother   . Crohn's disease Brother   . Mental illness Maternal Grandfather   . Anxiety disorder Maternal Grandfather   . OCD Maternal Grandfather   . Heart disease Paternal Grandfather   . Diabetes Paternal Grandfather   . Heart attack Paternal Grandfather   . Breast cancer Maternal Grandmother   . Healthy Daughter     Social History   Socioeconomic History  . Marital status: Married    Spouse name: Not on file  . Number of children: 1  . Years of education: Not on file  . Highest education level: Master's degree (e.g., MA, MS, MEng, MEd, MSW, MBA)  Occupational History  . Not on file  Tobacco Use  . Smoking status: Never Smoker  . Smokeless tobacco: Never Used  Vaping Use  . Vaping Use: Never used  Substance and Sexual Activity  . Alcohol use: Yes    Comment: 1 monthly   . Drug use: Never  . Sexual activity: Yes    Birth control/protection: Pill  Other Topics Concern  . 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      Home Situation: lives with husband and daughter      Spiritual Beliefs: Jewish      Lifestyle:  regular, aerobic and orange theory; diet healthy       Update: uses peloton 5x a week      Right handed      Caffeine: 1 cup/day   Social Determinants of Corporate investment banker Strain: Not on file  Food Insecurity: Not on file  Transportation Needs: Not on file  Physical Activity: Not on file  Stress: Not on file   Social Connections: Not on file  Intimate Partner Violence: Not on file    Past Medical History, Surgical history, Social history, and Family history were reviewed and updated as appropriate.   Please see review of systems for further details on the patient's review from today.   Objective:   Physical Exam:  LMP 01/11/2020   Physical Exam Neurological:     Mental Status: She is alert and oriented to person, place, and time.     Cranial Nerves: No dysarthria.  Psychiatric:        Attention and Perception: Attention and perception normal.  Mood and Affect: Mood normal.        Speech: Speech normal.        Behavior: Behavior is cooperative.        Thought Content: Thought content normal. Thought content is not paranoid or delusional. Thought content does not include homicidal or suicidal ideation. Thought content does not include homicidal or suicidal plan.        Cognition and Memory: Cognition and memory normal.        Judgment: Judgment normal.     Comments: Insight intact     Lab Review:     Component Value Date/Time   NA 139 07/29/2019 1652   K 3.7 07/29/2019 1652   CL 105 07/29/2019 1652   CO2 26 07/29/2019 1652   GLUCOSE 85 07/29/2019 1652   BUN 11 07/29/2019 1652   CREATININE 0.78 07/29/2019 1652   CALCIUM 9.5 07/29/2019 1652   PROT 8.0 07/29/2019 1652   PROT 8.0 07/29/2019 1652   ALBUMIN 4.7 01/18/2019 1226   AST 16 07/29/2019 1652   ALT 11 07/29/2019 1652   ALKPHOS 54 01/18/2019 1226   BILITOT 0.4 07/29/2019 1652   GFRNONAA 103 07/29/2019 1652   GFRAA 119 07/29/2019 1652       Component Value Date/Time   WBC 6.9 07/29/2019 1652   RBC 4.48 07/29/2019 1652   HGB 14.1 07/29/2019 1652   HCT 42.1 07/29/2019 1652   PLT 255 07/29/2019 1652   MCV 94.0 07/29/2019 1652   MCH 31.5 07/29/2019 1652   MCHC 33.5 07/29/2019 1652   RDW 12.0 07/29/2019 1652   LYMPHSABS 1,994 07/29/2019 1652   MONOABS 0.3 01/18/2019 1226   EOSABS 48 07/29/2019 1652    BASOSABS 83 07/29/2019 1652    No results found for: POCLITH, LITHIUM   No results found for: PHENYTOIN, PHENOBARB, VALPROATE, CBMZ   .res Assessment: Plan:   Will continue current plan of care since target signs and symptoms are well controlled without any tolerability issues. Continue BuSpar 20 mg twice daily for anxiety. Continue sertraline 100  milligrams daily for anxiety. We will follow-up in late July or early August after the birth of her second child.  Patient advised to contact office if she experiences any postpartum depression and needs to be seen sooner. Patient advised to contact office with any questions, adverse effects, or acute worsening in signs and symptoms.  Jemya was seen today for follow-up.  Diagnoses and all orders for this visit:  Generalized anxiety disorder -     busPIRone (BUSPAR) 15 MG tablet; TAKE 2 TABLETS (30 MG TOTAL) BY MOUTH 2 (TWO) TIMES DAILY. -     sertraline (ZOLOFT) 100 MG tablet; Take 1 tablet (100 mg total) by mouth daily.     Please see After Visit Summary for patient specific instructions.  Future Appointments  Date Time Provider Department Center  05/22/2020  8:15 AM [facility] NURSE [facility]  05/22/2020  8:30 AM [facility] US3 [facility]  11/14/2020  9:00 AM Corie Chiquito, PMHNP [facility] None    No orders of the defined types were placed in this encounter.     -------------------------------

## 2020-05-09 ENCOUNTER — Other Ambulatory Visit: Payer: Self-pay | Admitting: Physician Assistant

## 2020-05-09 DIAGNOSIS — M359 Systemic involvement of connective tissue, unspecified: Secondary | ICD-10-CM

## 2020-05-09 NOTE — Telephone Encounter (Addendum)
Last Visit: 07/29/2019 Next Visit: message sent to front desk to schedule appt, patient is aware she is due for appt.  Return in about 6 weeks (around 09/09/2019) for Autoimmune disease.  Labs received from [facility] on 03/08/2020 Reviewed by Hazel Sams, PA-C  Urine Culture, HBSAG Screen, Sed rate Antibody Screen, ANA, HCV Antibody, dsDNA, HIV, RPR, C3, C4, Rubella antiboies, Hgb Fraction Cascade, UA, CBC, CMP  Glucose 114 Creat. 0.56 Alk. Phos 39 ANA postive (no titer) UA: Ph 8.5 Protein 1+ RBC 3-10  Patient is currently pregnant and Dr. Estanislado Pandy advised patient on 03/01/2020 to continue PLQ.  Eye exam: 03/03/2019, patient is aware PLQ eye exam is due  Current Dose per office note 07/29/2019,  Plaquenil 200 mg 1 tablet by mouth twice daily.  DX: Autoimmune disease   Last Fill: 04/06/2020  Okay to refill Plaquenil?

## 2020-05-09 NOTE — Progress Notes (Deleted)
Office Visit Note  Patient: Jennifer Vincent             Date of Birth: 11/22/1990           MRN: [id number]             PCP: Caren Macadam, MD Referring: Caren Macadam, MD Visit Date: 05/23/2020 Occupation: @GUAROCC @  Subjective:    History of Present Illness: Jennifer Vincent is a 30 y.o. female with history of autoimmune disease.  She is taking plaquenil 200 mg 1 tablet by mouth twice daily.   Lab work from 03/10/20 was reviewed today in the office: ANA positive, dsDNA<1, C3 116, C4 30, UA 1+ protein, ESR 2, and CBC and CMP updated.   Activities of Daily Living:  Patient reports morning stiffness for *** {minute/hour:19697}.   Patient {ACTIONS;DENIES/REPORTS:21021675::"Denies"} nocturnal pain.  Difficulty dressing/grooming: {ACTIONS;DENIES/REPORTS:21021675::"Denies"} Difficulty climbing stairs: {ACTIONS;DENIES/REPORTS:21021675::"Denies"} Difficulty getting out of chair: {ACTIONS;DENIES/REPORTS:21021675::"Denies"} Difficulty using hands for taps, buttons, cutlery, and/or writing: {ACTIONS;DENIES/REPORTS:21021675::"Denies"}  No Rheumatology ROS completed.   PMFS History:  Patient Active Problem List   Diagnosis Date Noted  . Chronic migraine without aura, with intractable migraine, so stated, with status migrainosus 06/01/2019  . Pregnant 07/03/2018  . Generalized anxiety disorder 01/11/2018  . Panic disorder 10/04/2016  . Anxiety 10/04/2016    Past Medical History:  Diagnosis Date  . Allergic rhinitis   . Anxiety   . Autoimmune disorder ([place])    nonspecified  . COVID 04/05/2020  . Frequent headaches   . IBS (irritable bowel syndrome)   . Migraine   . PCOS (polycystic ovarian syndrome)     Family History  Problem Relation Age of Onset  . Depression Mother   . Eating disorder Mother   . Arthritis Father   . Crohn's disease Father   . Ulcerative colitis Father   . Arthritis Brother   . Alcohol abuse Brother   . Mental illness Brother   . Depression Brother    . Anxiety disorder Brother   . Crohn's disease Brother   . Mental illness Maternal Grandfather   . Anxiety disorder Maternal Grandfather   . OCD Maternal Grandfather   . Heart disease Paternal Grandfather   . Diabetes Paternal Grandfather   . Heart attack Paternal Grandfather   . Breast cancer Maternal Grandmother   . Healthy Daughter    Past Surgical History:  Procedure Laterality Date  . 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 Influenza-Unspecified 12/16/2016, 12/25/2017, 12/16/2018  . PFIZER(Purple Top)SARS-COV-2 Vaccination 05/13/2019, 06/02/2019  . PPD Test 10/13/2014     Objective: Vital Signs: LMP 01/11/2020    Physical Exam   Musculoskeletal Exam: ***  CDAI Exam: CDAI Score: -- Patient Global: --; Provider Global: -- Swollen: --; Tender: -- Joint Exam 05/23/2020   No joint exam has been documented for this visit   There is currently no information documented on the homunculus. Go to the Rheumatology activity and complete the homunculus joint exam.  Investigation: No additional findings.  Imaging:  [place] MFM OB 11-14 WEEK ANATOMY  Result Date: 04/17/2020 ----------------------------------------------------------------------  OBSTETRICS REPORT                       (Signed Final 04/17/2020 05:43 pm) ---------------------------------------------------------------------- Patient Info  ID #:       [id number]                          D.O.B.:  1991-03-13 (29 yrs)  Name:       Jennifer Vincent                      Visit Date: 04/17/2020 01:15 pm ----------------------------------------------------------------------  Performed By  Attending:        Johnell Comings MD         Ref. Address:     [place]                                                             [place]  Performed By:     Polo Riley        [facility]                                                             [place]  Referred By:      Everlene Farrier                    MD ---------------------------------------------------------------------- Orders  #  Description  Code        Ordered By  1  [place] MFM OB 11-14 WEEK                  [phone]      Everlene Farrier     ANATOMY ----------------------------------------------------------------------  #  Order #                     Accession #                Episode #  1  [id number]                   [id number]                 [id number] ---------------------------------------------------------------------- Indications  Anticardiolipin antibody affecting pregnancy,  O99.89  antepartum  + ANA  [redacted] weeks gestation of pregnancy                Z3A.14 ---------------------------------------------------------------------- Fetal Evaluation  Num Of Fetuses:         1  Fetal Heart Rate(bpm):  150  Cardiac Activity:       Observed  Presentation:           Variable  Placenta:               Anterior  P. Cord Insertion:      Not well visualized  Amniotic Fluid  AFI FV:      Within normal limits ---------------------------------------------------------------------- Biometry  CRL:      73.8  mm     G. Age:  13w 2d                  EDD:   10/21/20  NT:       1.63  mm ---------------------------------------------------------------------- OB History  Gravidity:    2         Term:   1        Prem:   0        SAB:    0  TOP:          0       Ectopic:  0        Living: 1 ---------------------------------------------------------------------- Gestational Age  Clinical EDD:  14w 0d                                        EDD:   10/16/20  Best:          14w 0d     Det. By:  Clinical EDD             EDD:   10/16/20 ---------------------------------------------------------------------- Anatomy  Cranium:               Visualized             Abdomen:                Visualized  Choroid Plexus:        Visualized             Abdominal Wall:         Visualized  Heart:                 Visualized             Kidneys:  Visualized  RVOT:                  Visualized             Bladder:                Visualized  Aortic Arch:           Visualized             Spine:                  Visualized  Diaphragm:             Visualized             Upper Extremities:      Visualized  Stomach:               Visualized             Lower Extremities:      Visualized  Other:  3VV visualized ---------------------------------------------------------------------- Cervix Uterus Adnexa  Cervix  Normal appearance by transabdominal scan.  Uterus  No abnormality visualized.  Right Ovary  Appears Normal  Left Ovary  Appears Normal ---------------------------------------------------------------------- Comments  Jennifer Vincent was seen in consultation today due to a  nonspecific autoimmune disorder.  She reports that prior to  her first pregnancy at around 2019, she started to experience  joint pain in her arms, hands, and knees.  The symptoms  worsened after her delivery and she was referred to a  rheumatologist.  Her rheumatologist then started her on  Plaquenil and her symptoms subsequently resolved.  She  reports that she has screened positive twice  for the IgG  anticardiolipin antibodies.  She denies any history of a prior  thromboembolic event.  This is her second pregnancy.  Her  first pregnancy resulted in a vaginal delivery of a full-term   appropriately grown neonate.  She did not experience any  complications following delivery.  She denies any history of  miscarriages, stillbirths, or preeclampsia.  She has also  screened positive for the antinuclear antibodies.  She reports that she is recovering from a recent COVID-19  infection.  She has been fully vaccinated against COVID-19  including the booster vaccine shot.  Her past medical history includes anxiety and migraine  headaches.  Her past surgical history includes a tonsillectomy.  She reports that she had a cell free DNA test drawn about 3  days ago.  The results of that test are currently pending.  On today's ultrasound exam, a viable singleton intrauterine  pregnancy was noted.  The crown-rump length measured  today is consistent with her gestational age giving her an  [facility] of October 16, 2020.  During our consultation today, the following were discussed:  Nonspecific autoimmune disease and pregnancy  The patient currently does not meet the criteria for a  diagnosis of lupus.  She was reassured that women with  nonspecific autoimmune diseases often will have a normal  pregnancy outcome.  However, the increased risk of adverse  pregnancy outcomes such as the development of  preeclampsia, an indicated preterm birth, and fetal growth  restriction was discussed.  She should continue close follow-up with her rheumatologist  throughout her pregnancy.  She should continue taking Plaquenil as recommended by  her rheumatologist.  She reports that she has an eye exam  scheduled next week.  The patient has screened negative for the [facility] and SSB  antibodies in November 2020.  Therefore, her  baby should  not be at risk for neonatal lupus or a congenital heart block.  We will continue to follow her closely with monthly growth  ultrasounds.  Weekly fetal testing should be started at around  32 weeks.  Delivery should probably occur at around 39 weeks or earlier  should there be any complications.  Positive IgG  anticardiolipin antibodies  As the patient does not have a history of a prior  thromboembolic event or an adverse pregnancy outcome  (recurrent miscarriages, IUFD, or early onset severe  preeclampsia), she does not meet the criteria for the  antiphospholipid antibody syndrome.  Therefore,  anticoagulation prophylaxis with Lovenox is not  recommended during her pregnancy.  The increased risk that she may develop a thromboembolic  event or have an adverse pregnancy outcome due to these  antibodies was discussed.  It may be reasonable for her to  continue taking a daily baby aspirin throughout her pregnancy  as it will not do any harm.  As the risk for a thromboembolic event is highest during the  postpartum period especially if she is delivered via C-section,  anticoagulation prophylaxis (with Lovenox) may be  considered for 6 weeks postpartum should she be delivered  via C-section.  Recent COVID-19 infection and pregnancy  As the patient has already been fully vaccinated and has  received the booster shot, she was advised to continue with  the routine preventative measures for COVID-19  transmission.  The small association of a COVID-19 infection in pregnancy  with stillbirths was discussed.  As she will already be  receiving weekly fetal testing due to her nonspecific  autoimmune disorder starting at 32 weeks, we will hopefully  be able to modify her risk for a stillbirth.  A detailed fetal anatomy scan has been scheduled in our  office at around 19 weeks.  At the end of the consultation, the patient stated that all of her  questions had been answered to her complete satisfaction.  We will continue to follow her closely with you throughout this  pregnancy.  Thank you for referring this patient for a Maternal-Fetal  Medicine consultation.  Total time spent in consultation: 45 minutes. ---------------------------------------------------------------------- Recommendations  Continue Plaquenil and daily baby aspirin  throughout  pregnancy  No need for anticoagulation prophylaxis during pregnancy  Detailed fetal anatomy scan  Monthly growth ultrasounds  Weekly fetal testing starting at 32 weeks  Delivery at around 39 weeks  Consider anticoagulation prophylaxis for 6 weeks postpartum  should she be delivered via C-section  Continue to follow-up with her rheumatologist during  pregnancy ----------------------------------------------------------------------                   Johnell Comings, MD Electronically Signed Final Report   04/17/2020 05:43 pm ----------------------------------------------------------------------   Recent Labs: Lab Results  Component Value Date   WBC 6.9 07/29/2019   HGB 14.1 07/29/2019   PLT 255 07/29/2019   NA 139 07/29/2019   K 3.7 07/29/2019   CL 105 07/29/2019   CO2 26 07/29/2019   GLUCOSE 85 07/29/2019   BUN 11 07/29/2019   CREATININE 0.78 07/29/2019   BILITOT 0.4 07/29/2019   ALKPHOS 54 01/18/2019   AST 16 07/29/2019   ALT 11 07/29/2019   PROT 8.0 07/29/2019   PROT 8.0 07/29/2019   ALBUMIN 4.7 01/18/2019   CALCIUM 9.5 07/29/2019   GFRAA 119 07/29/2019   QFTBGOLDPLUS NEGATIVE 07/29/2019    Speciality Comments: PLQ eye exam: 03/03/2019 normal. [facility]. Follow up  in 1 year.  Procedures:  No procedures performed Allergies: Lactose intolerance (gi)   Assessment / Plan:     Visit Diagnoses: Autoimmune disease ([place])  High risk medication use  Anticardiolipin antibody positive  Pain of both elbows  Chondromalacia of both patellae  Generalized anxiety disorder  Panic disorder  History of migraine  History of IBS  Family history of Crohn's disease  Family history of ulcerative colitis  Orders: No orders of the defined types were placed in this encounter.  No orders of the defined types were placed in this encounter.   Face-to-face time spent with patient was *** minutes. Greater than 50% of time was spent in counseling and coordination of  care.  Follow-Up Instructions: No follow-ups on file.   Ofilia Neas, PA-C  Note - This record has been created using Dragon software.  Chart creation errors have been sought, but may not always  have been located. Such creation errors do not reflect on  the standard of medical care.

## 2020-05-09 NOTE — Telephone Encounter (Signed)
Please call patient to schedule f/u appt. Return in about 6 weeks (around 09/09/2019) for Autoimmune disease. Thank you.

## 2020-05-11 NOTE — Telephone Encounter (Signed)
See other encounter. We do have botox approval. Pending pt schedule.

## 2020-05-11 NOTE — Telephone Encounter (Signed)
Received approval from [facility]. Reference #[id number] (05/08/20- 05/07/21). PA is for injections with Dr. Lucia Gaskins.

## 2020-05-15 ENCOUNTER — Ambulatory Visit (INDEPENDENT_AMBULATORY_CARE_PROVIDER_SITE_OTHER): Payer: BC Managed Care – PPO | Admitting: Neurology

## 2020-05-15 DIAGNOSIS — G43711 Chronic migraine without aura, intractable, with status migrainosus: Secondary | ICD-10-CM | POA: Diagnosis not present

## 2020-05-15 NOTE — Progress Notes (Signed)
Botox- 200 units x 1 vial Lot: [id number] Expiration: 01/2023 NDC: 0023-3921-02  Bacteriostatic 0.9% Sodium Chloride- 4mL total Lot: [id number] Expiration: 04/18/2021 NDC: 0409-1966-02  Dx: G43.711 B/B  

## 2020-05-15 NOTE — Progress Notes (Signed)
Consent Form Botulism Toxin Injection For Chronic Migraine  05/15/2020: discussed risks of botox in pregnancy. +a. +10u masseters bid.  12/16/2019: +10u bid masseters.  Doing great. Trying to get pregnant. Loved the masseters. Only had 5 headaches the last 2 weeks whie wearing off which is fantastic for her and int he setting of wearing off is great. > 70% improvement in severity and frequency. +a - masseters please ak next time and we may include masseters. On ajvy she did great, . The Ajovy has helped tremendously. The other migraines have been better and easier to treat. Ubrelvy helps but doesn't "kick it". The migraines that are the worst are around her menses. She is working with her OB for birth control. She has been taking the minipill continuosly but not working. She was just switched on Sunday to Lo-Loestrin continuously and that is helping. We discussed she is going to get pregnant, Ajovy is contraindicated as are the cgrps, we can start botox.. After a long discussion, decided on botox, discussed risks in pregnancy, also want her to discuss with her obgyn, titrate off of zonegran. NOT ON AJOVY   Medications tried: topamax, maxalt, zoloft, imitrex, zonegran, propranolol, zofran,  Amitriptyline,flexeril  Reviewed orally with patient, additionally signature is on file:  Botulism toxin has been approved by the Federal drug administration for treatment of chronic migraine. Botulism toxin does not cure chronic migraine and it may not be effective in some patients.  The administration of botulism toxin is accomplished by injecting a small amount of toxin into the muscles of the neck and head. Dosage must be titrated for each individual. Any benefits resulting from botulism toxin tend to wear off after 3 months with a repeat injection required if benefit is to be maintained. Injections are usually done every 3-4 months with maximum effect peak achieved by about 2 or 3 weeks. Botulism toxin is expensive  and you should be sure of what costs you will incur resulting from the injection.  The side effects of botulism toxin use for chronic migraine may include:   -Transient, and usually mild, facial weakness with facial injections  -Transient, and usually mild, head or neck weakness with head/neck injections  -Reduction or loss of forehead facial animation due to forehead muscle weakness  -Eyelid drooping  -Dry eye  -Pain at the site of injection or bruising at the site of injection  -Double vision  -Potential unknown long term risks  Contraindications: You should not have Botox if you are pregnant, nursing, allergic to albumin, have an infection, skin condition, or muscle weakness at the site of the injection, or have myasthenia gravis, Lambert-Eaton syndrome, or ALS.  It is also possible that as with any injection, there may be an allergic reaction or no effect from the medication. Reduced effectiveness after repeated injections is sometimes seen and rarely infection at the injection site may occur. All care will be taken to prevent these side effects. If therapy is given over a long time, atrophy and wasting in the muscle injected may occur. Occasionally the patient's become refractory to treatment because they develop antibodies to the toxin. In this event, therapy needs to be modified.  I have read the above information and consent to the administration of botulism toxin.    BOTOX PROCEDURE NOTE FOR MIGRAINE HEADACHE    Contraindications and precautions discussed with patient(above). Aseptic procedure was observed and patient tolerated procedure. Procedure performed by Dr. Toni Jaquay Posthumus  The condition has existed for more than 6 months,   and pt does not have a diagnosis of ALS, Myasthenia Gravis or Lambert-Eaton Syndrome.  Risks and benefits of injections discussed and pt agrees to proceed with the procedure.  Written consent obtained  These injections are medically necessary. Pt  receives  good benefits from these injections. These injections do not cause sedations or hallucinations which the oral therapies may cause.  Description of procedure:  The patient was placed in a sitting position. The standard protocol was used for Botox as follows, with 5 units of Botox injected at each site:   -Procerus muscle, midline injection  -Corrugator muscle, bilateral injection  -Frontalis muscle, bilateral injection, with 2 sites each side, medial injection was performed in the upper one third of the frontalis muscle, in the region vertical from the medial inferior edge of the superior orbital rim. The lateral injection was again in the upper one third of the forehead vertically above the lateral limbus of the cornea, 1.5 cm lateral to the medial injection site.  -Temporalis muscle injection, 4 sites, bilaterally. The first injection was 3 cm above the tragus of the ear, second injection site was 1.5 cm to 3 cm up from the first injection site in line with the tragus of the ear. The third injection site was 1.5-3 cm forward between the first 2 injection sites. The fourth injection site was 1.5 cm posterior to the second injection site.   -Occipitalis muscle injection, 3 sites, bilaterally. The first injection was done one half way between the occipital protuberance and the tip of the mastoid process behind the ear. The second injection site was done lateral and superior to the first, 1 fingerbreadth from the first injection. The third injection site was 1 fingerbreadth superiorly and medially from the first injection site.  -Cervical paraspinal muscle injection, 2 sites, bilateral knee first injection site was 1 cm from the midline of the cervical spine, 3 cm inferior to the lower border of the occipital protuberance. The second injection site was 1.5 cm superiorly and laterally to the first injection site.  -Trapezius muscle injection was performed at 3 sites, bilaterally. The first injection  site was in the upper trapezius muscle halfway between the inflection point of the neck, and the acromion. The second injection site was one half way between the acromion and the first injection site. The third injection was done between the first injection site and the inflection point of the neck.   Will return for repeat injection in 3 months.   200 units of Botox was used, any Botox not injected was wasted. The patient tolerated the procedure well, there were no complications of the above procedure.

## 2020-05-18 ENCOUNTER — Ambulatory Visit: Payer: Self-pay | Admitting: Neurology

## 2020-05-18 ENCOUNTER — Encounter: Payer: Self-pay | Admitting: Physician Assistant

## 2020-05-18 DIAGNOSIS — Z79899 Other long term (current) drug therapy: Secondary | ICD-10-CM | POA: Diagnosis not present

## 2020-05-18 NOTE — Progress Notes (Deleted)
Performed by Dr. Lucia Gaskins M.D. ***ml Lidocaine 1%,***ml Marcaine 0.5% in the 30-gauge needle was used. All procedures a documented blood were medically necessary, reasonable and appropriate based on the patient's history, medical diagnosis and physician opinion. Verbal informed consent was obtained from the patient, patient was informed of potential risk of procedure, including bruising, bleeding, hematoma formation, infection, muscle weakness, muscle pain, numbness, transient hypertension, transient hyperglycemia and transient insomnia among others. All areas injected were topically clean with isopropyl rubbing alcohol. Nonsterile nonlatex gloves were worn during the procedure.  1. Greater occipital nerve block [id number]). The greater occipital nerve site was identified at the nuchal line medial to the occipital artery. Medication was injected into the left and right occipital nerve areas and suboccipital areas. Patient's condition is associated with inflammation of the greater occipital nerve and associated multiple groups. Injection was deemed medically necessary, reasonable and appropriate. Injection represents a separate and unique surgical service.  2. Lesser occipital nerve block [phone]). The lesser occipital nerve site was identified approximately 2 cm lateral to the greater occipital nerve. Occasion was injected into the left and right occipital nerve areas. Patient's condition is associated with inflammation of the lesser occipital nerve and associated muscle groups. Injection was deemed medically necessary, reasonable and appropriate. Injection represents a separate and unique surgical service.   3. Auriculotemporal nerve block (21194): The Auriculotemporal nerve site was identified along the posterior margin of the sternocleidomastoid muscle toward the base of the ear. Medication was injected into the left and right radicular temporal nerve areas. Patient's condition is associated with inflammation of  the Auriculotemporal Nerve and associated muscle groups. Injection was deemed medically necessary, reasonable and appropriate. Injection represents a separate and unique surgical service.  4. Supraorbital nerve block (64400): Supraorbital nerve site was identified along the incision of the frontal bone on the orbital/supraorbital ridge. Medication was injected into the left and right supraorbital nerve areas. Patient's condition is associated with inflammation of the supraorbital and associated muscle groups. Injection was deemed medically necessary, reasonable and appropriate. Injection represents a separate and unique surgical service.  5. Facial nerve block [phone]): Temporal nerve branch of the facial nerve was identified. Medication was injected into the left and right facial nerve areas. Patient's condition is associated with inflammation of the facial nerve and associated muscle groups. Injection was deemed medically necessary, reasonable and appropriate. Injection represents a separate and unique surgical service.

## 2020-05-19 ENCOUNTER — Ambulatory Visit: Payer: Self-pay | Admitting: Neurology

## 2020-05-19 ENCOUNTER — Telehealth: Payer: Self-pay | Admitting: Neurology

## 2020-05-19 NOTE — Telephone Encounter (Signed)
Patient without headache, will hold off on nerve blocks

## 2020-05-22 ENCOUNTER — Other Ambulatory Visit: Payer: Self-pay

## 2020-05-22 ENCOUNTER — Ambulatory Visit: Payer: BC Managed Care – PPO | Attending: Obstetrics

## 2020-05-22 ENCOUNTER — Ambulatory Visit: Payer: BC Managed Care – PPO | Admitting: *Deleted

## 2020-05-22 ENCOUNTER — Other Ambulatory Visit: Payer: Self-pay | Admitting: *Deleted

## 2020-05-22 ENCOUNTER — Encounter: Payer: Self-pay | Admitting: *Deleted

## 2020-05-22 VITALS — BP 106/70 | HR 96

## 2020-05-22 DIAGNOSIS — O99891 Other specified diseases and conditions complicating pregnancy: Secondary | ICD-10-CM

## 2020-05-22 DIAGNOSIS — R760 Raised antibody titer: Secondary | ICD-10-CM | POA: Insufficient documentation

## 2020-05-22 DIAGNOSIS — Z363 Encounter for antenatal screening for malformations: Secondary | ICD-10-CM

## 2020-05-22 DIAGNOSIS — Z3689 Encounter for other specified antenatal screening: Secondary | ICD-10-CM

## 2020-05-22 DIAGNOSIS — D8989 Other specified disorders involving the immune mechanism, not elsewhere classified: Secondary | ICD-10-CM | POA: Insufficient documentation

## 2020-05-22 DIAGNOSIS — O99212 Obesity complicating pregnancy, second trimester: Secondary | ICD-10-CM

## 2020-05-22 DIAGNOSIS — Z3A18 18 weeks gestation of pregnancy: Secondary | ICD-10-CM

## 2020-05-22 DIAGNOSIS — E669 Obesity, unspecified: Secondary | ICD-10-CM

## 2020-05-23 ENCOUNTER — Ambulatory Visit: Payer: Self-pay | Admitting: Physician Assistant

## 2020-05-23 DIAGNOSIS — F410 Panic disorder [episodic paroxysmal anxiety] without agoraphobia: Secondary | ICD-10-CM

## 2020-05-23 DIAGNOSIS — Z8719 Personal history of other diseases of the digestive system: Secondary | ICD-10-CM

## 2020-05-23 DIAGNOSIS — Z79899 Other long term (current) drug therapy: Secondary | ICD-10-CM

## 2020-05-23 DIAGNOSIS — Z8669 Personal history of other diseases of the nervous system and sense organs: Secondary | ICD-10-CM

## 2020-05-23 DIAGNOSIS — M25521 Pain in right elbow: Secondary | ICD-10-CM

## 2020-05-23 DIAGNOSIS — M2241 Chondromalacia patellae, right knee: Secondary | ICD-10-CM

## 2020-05-23 DIAGNOSIS — Z8379 Family history of other diseases of the digestive system: Secondary | ICD-10-CM

## 2020-05-23 DIAGNOSIS — F411 Generalized anxiety disorder: Secondary | ICD-10-CM

## 2020-05-23 DIAGNOSIS — M359 Systemic involvement of connective tissue, unspecified: Secondary | ICD-10-CM

## 2020-05-23 DIAGNOSIS — R760 Raised antibody titer: Secondary | ICD-10-CM

## 2020-05-25 DIAGNOSIS — R0982 Postnasal drip: Secondary | ICD-10-CM | POA: Diagnosis not present

## 2020-05-25 DIAGNOSIS — J029 Acute pharyngitis, unspecified: Secondary | ICD-10-CM | POA: Diagnosis not present

## 2020-05-25 DIAGNOSIS — R0981 Nasal congestion: Secondary | ICD-10-CM | POA: Diagnosis not present

## 2020-05-31 ENCOUNTER — Ambulatory Visit: Payer: Self-pay | Admitting: Physician Assistant

## 2020-06-06 ENCOUNTER — Other Ambulatory Visit: Payer: Self-pay | Admitting: Physician Assistant

## 2020-06-06 DIAGNOSIS — M359 Systemic involvement of connective tissue, unspecified: Secondary | ICD-10-CM

## 2020-06-06 NOTE — Telephone Encounter (Signed)
Last Visit: 08/16/2019 Next Visit: 06/15/2020 Labs: Labs received from [facility] on 03/08/2020 Reviewed by Hazel Sams, PA-C  Urine Culture, HBSAG Screen, Sed rate Antibody Screen, ANA, HCV Antibody, dsDNA, HIV, RPR, C3, C4, Rubella antiboies, Hgb Fraction Cascade, UA, CBC, CMP  Glucose 114 Creat. 0.56 Alk. Phos 39 ANA postive (no titer) UA: Ph 8.5 Protein 1+ RBC 3-10  Patient is currently pregnant and Dr. Estanislado Pandy advised patient on 03/01/2020 to continue PLQ.  Eye exam: 03/03/2019, patient has been called multiple times to have PLQ eye exam performed.  Current Dose per office note 07/29/2019, Plaquenil 200 mg 1 tablet by mouth twice daily ZM:[id number] disease  Last Fill: 05/09/2020  Okay to refill Plaquenil?

## 2020-06-12 DIAGNOSIS — N390 Urinary tract infection, site not specified: Secondary | ICD-10-CM | POA: Diagnosis not present

## 2020-06-14 NOTE — Progress Notes (Signed)
Office Visit Note  Patient: Jennifer Vincent             Date of Birth: 04/20/1990           MRN: [id number]             PCP: Caren Macadam, MD Referring: Caren Macadam, MD Visit Date: 06/15/2020 Occupation: @GUAROCC @  Subjective:  Pain in both hands and elbows   History of Present Illness: Kit Brubacher is a 30 y.o. female with history of autoimmune disease.  Patient is taking Plaquenil 200 mg 1 tablet by mouth twice daily.  She has been tolerating Plaquenil without any side effects.  She is currently [redacted] weeks pregnant and has been following up closely with her OB-Gyn and [facility].  She states her energy level has been stable. Patient reports that she has been experiencing some increased pain and stiffness in both hands and both elbow joints. She takes tylenol as needed at bedtime to help her sleep if she is experiencing increased arthralgias.  It has been difficult to tell if she has had any joint swelling versus fluid retention from the pregnancy.  She denies any rashes and has been trying to avoid direct sun exposure.  She denies any symptoms of Raynaud's.  She has not noticed any hair loss.  She denies any oral or nasal ulcerations.  She continues to have eye dryness and uses Systane eyedrops.  She has not had any mouth dryness or swollen lymph nodes.  She has not had any shortness of breath, palpitations, or pleuritic chest pain.  Her maternal fetal specialist recommended starting on aspirin 81 mg daily due to her history of positive anticardiolipin antibodies.     Activities of Daily Living:  Patient reports morning stiffness for 0 minutes.   Patient Reports nocturnal pain.  Difficulty dressing/grooming: Denies Difficulty climbing stairs: Denies Difficulty getting out of chair: Denies Difficulty using hands for taps, buttons, cutlery, and/or writing: Denies  Review of Systems  Constitutional: Positive for fatigue.  HENT: Negative for mouth sores, mouth dryness and nose dryness.    Eyes: Positive for dryness. Negative for pain and visual disturbance.  Respiratory: Negative for cough, hemoptysis, shortness of breath and difficulty breathing.   Cardiovascular: Negative for chest pain, palpitations, hypertension and swelling in legs/feet.  Gastrointestinal: Negative for blood in stool, constipation and diarrhea.  Endocrine: Negative for increased urination.  Genitourinary: Negative for difficulty urinating and painful urination.  Musculoskeletal: Positive for joint swelling. Negative for arthralgias, joint pain, myalgias, muscle weakness, morning stiffness, muscle tenderness and myalgias.  Skin: Negative for color change, pallor, rash, hair loss, nodules/bumps, skin tightness, ulcers and sensitivity to sunlight.  Allergic/Immunologic: Negative for susceptible to infections.  Neurological: Negative for dizziness, numbness, headaches and weakness.  Hematological: Negative for swollen glands.  Psychiatric/Behavioral: Negative for depressed mood and sleep disturbance. The patient is not nervous/anxious.     PMFS History:  Patient Active Problem List   Diagnosis Date Noted  . Chronic migraine without aura, with intractable migraine, so stated, with status migrainosus 06/01/2019  . Pregnant 07/03/2018  . Generalized anxiety disorder 01/11/2018  . Panic disorder 10/04/2016  . Anxiety 10/04/2016    Past Medical History:  Diagnosis Date  . Allergic rhinitis   . Anxiety   . Autoimmune disorder ([place])    nonspecified  . COVID 04/05/2020  . Frequent headaches   . IBS (irritable bowel syndrome)   . Migraine   . PCOS (polycystic ovarian syndrome)  Family History  Problem Relation Age of Onset  . Depression Mother   . Eating disorder Mother   . Arthritis Father   . Crohn's disease Father   . Ulcerative colitis Father   . Arthritis Brother   . Alcohol abuse Brother   . Mental illness Brother   . Depression Brother   . Anxiety disorder Brother   . Crohn's disease  Brother   . Mental illness Maternal Grandfather   . Anxiety disorder Maternal Grandfather   . OCD Maternal Grandfather   . Heart disease Paternal Grandfather   . Diabetes Paternal Grandfather   . Heart attack Paternal Grandfather   . Breast cancer Maternal Grandmother   . Healthy Daughter    Past Surgical History:  Procedure Laterality Date  . 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 Influenza-Unspecified 12/16/2016, 12/25/2017, 12/16/2018  . PFIZER(Purple Top)SARS-COV-2 Vaccination 05/13/2019, 06/02/2019, 02/16/2020  . PPD Test 10/13/2014     Objective: Vital Signs: BP 115/80 (BP Location: Left Arm, Patient Position: Sitting, Cuff Size: Normal)   Pulse 97   Resp 14   Ht $R'5\' 9"'oX$  (1.753 m)   Wt 236 lb (107 kg)   LMP 01/11/2020   BMI 34.85 kg/m    Physical Exam Vitals and nursing note reviewed.  Constitutional:      Appearance: She is well-developed.  HENT:     Head: Normocephalic and atraumatic.  Eyes:     Conjunctiva/sclera: Conjunctivae normal.  Pulmonary:     Effort: Pulmonary effort is normal.  Abdominal:     Palpations: Abdomen is soft.  Musculoskeletal:     Cervical back: Normal range of motion.  Skin:    General: Skin is warm and dry.     Capillary Refill: Capillary refill takes less than 2 seconds.  Neurological:     Mental Status: She is alert and oriented to person, place, and time.  Psychiatric:        Behavior: Behavior normal.      Musculoskeletal Exam:  C-spine, thoracic spine, and lumbar spine good ROM. Tenderness over SI joints bilaterally. Shoulder joints, elbow joints, wrist joints,  MCPs, PIPs, and DIPs good ROM. Tenderness along the elbow joint line bilaterally.  She has tenderness over several PIP and DIP joints as described below.  No synovitis or dactylitis was noted.  Hip joints have good range of motion with no discomfort.  Knee joints have good range of motion with no warmth or effusion.  Ankle joints have good range of motion with no tenderness or inflammation.  CDAI Exam: CDAI Score: -- Patient Global: --; Provider Global: -- Swollen: 0 ; Tender: 11  Joint Exam 06/15/2020      Right  Left  Elbow   Tender   Tender  PIP 2   Tender   Tender  PIP 3   Tender   Tender  PIP 4   Tender     DIP 2   Tender   Tender  DIP 3   Tender   Tender  Investigation: No additional findings.  Imaging: [place] MFM OB DETAIL +14 WK  Result Date: 05/22/2020 ----------------------------------------------------------------------  OBSTETRICS REPORT                       (Signed Final 05/22/2020 10:38 am) ---------------------------------------------------------------------- Patient Info  ID #:       [id number]                          D.O.B.:  [birth year] (29 yrs)  Name:       KHALESSI BLOUGH                      Visit Date: 05/22/2020 08:32 am ---------------------------------------------------------------------- Performed By  Attending:        Johnell Comings MD         Ref. Address:     [place]                                                             [place]  Performed By:     Jeanene Erb BS,      Location[facility]                                                             [facility]  Referred By:      Everlene Farrier                    MD  ---------------------------------------------------------------------- Orders  #  Description  Code        Ordered By  1  [place] MFM OB DETAIL +14 WK               [id number]    YU FANG ----------------------------------------------------------------------  #  Order #                     Accession #                Episode #  1  [id number]                   [id number]                 [id number] ---------------------------------------------------------------------- Indications  Anticardiolipin antibody affecting pregnancy,  O99.89  antepartum  Obesity complicating pregnancy, second         O99.212  trimester (Pregravid BMI 32)  + ANA  Encounter for antenatal screening for          Z36.3  malformations  [redacted] weeks gestation of pregnancy                Z3A.18 ---------------------------------------------------------------------- Vital Signs  Weight (lb): 217                               Height:        5'8"  BMI:         32.99 ---------------------------------------------------------------------- Fetal Evaluation  Num Of Fetuses:         1  Fetal Heart Rate(bpm):  145  Cardiac Activity:       Observed  Presentation:           Cephalic  Placenta:               Anterior  P. Cord Insertion:      Visualized  Amniotic Fluid  AFI FV:      Within normal limits                              Largest Pocket(cm)                              3.4 ---------------------------------------------------------------------- Biometry  BPD:      43.3  mm     G. Age:  19w 1d         61  %    CI:        73.96   %    70 - 86                                                          FL/HC:      18.1   %    16.1 - 18.3  HC:      159.9  mm     G. Age:  18w 6d         41  %    HC/AC:      1.13        1.09 - 1.39  AC:      142.1  mm     G. Age:  19w 4d  70  %    FL/BPD:     67.0   %  FL:         29  mm     G. Age:  18w 6d         46  %    FL/AC:      20.4   %    20 - 24  CER:      18.7  mm     G. Age:  18w 3d         16  %  NFT:        4.4  mm  LV:        7.3  mm  CM:        3.9  mm  Est. FW:     280  gm    0 lb 10 oz      67  % ---------------------------------------------------------------------- OB History  Gravidity:    2         Term:   1        Prem:   0        SAB:   0  TOP:          0       Ectopic:  0        Living: 1 ---------------------------------------------------------------------- Gestational Age  LMP:           18w 6d        Date:  01/11/20                 EDD:   10/17/20  U/S Today:     19w 1d                                        EDD:   10/15/20  Best:          18w 6d     Det. By:  LMP  (01/11/20)          EDD:   10/17/20 ---------------------------------------------------------------------- Anatomy  Cranium:               Appears normal         Aortic Arch:            Appears normal  Cavum:                 Appears normal         Ductal Arch:            Appears normal  Ventricles:            Appears normal         Diaphragm:              Appears normal  Choroid Plexus:        Appears normal         Stomach:                Appears normal, left  sided  Cerebellum:            Appears normal         Abdomen:                Appears normal  Posterior Fossa:       Appears normal         Abdominal Wall:         Appears nml (cord                                                                        insert, abd wall)  Nuchal Fold:           Appears normal         Cord Vessels:           Appears normal (3                                                                        vessel cord)  Face:                  Appears normal         Kidneys:                Appear normal                         (orbits and profile)  Lips:                  Appears normal         Bladder:                Appears normal  Thoracic:              Appears normal         Spine:                  Appears normal  Heart:                 Not well visualized    Upper Extremities:      Appears  normal  RVOT:                  Appears normal         Lower Extremities:      Appears normal  LVOT:                  Appears normal  Other:  Fetus appears to be a female. Heels visualized. Open hands visualized. ---------------------------------------------------------------------- Cervix Uterus Adnexa  Cervix  Length:           3.45  cm.  Normal appearance by transabdominal scan. ---------------------------------------------------------------------- Comments  This patient was seen for a detailed fetal anatomy scan due  to a nonspecific autoimmune disease.  She has also  screened positive for the IgG anticardiolipin antibodies  without a history of a prior thromboembolic event.  She is  treated with Plaquenil.  She denies any problems since her  last exam.  She had a cell free DNA test earlier in her pregnancy which  indicated a low risk for trisomy 37, 71, and 13. A female fetus is  predicted.  She was informed that the fetal growth and amniotic fluid  level were appropriate for her gestational age.  There were no obvious fetal anomalies noted on today's  ultrasound exam.  The patient was informed that anomalies may be missed due  to technical limitations. If the fetus is in a suboptimal position  or maternal habitus is increased, visualization of the fetus in  the maternal uterus may be impaired.  Due to her history of a nonspecific autoimmune disease, we  will continue to follow her with growth ultrasounds throughout  her pregnancy.  A follow-up exam was scheduled in 4 weeks to assess the  fetal growth and to complete the views of the fetal heart. ----------------------------------------------------------------------                   Johnell Comings, MD Electronically Signed Final Report   05/22/2020 10:38 am ----------------------------------------------------------------------   Recent Labs: Lab Results  Component Value Date   WBC 6.9 07/29/2019   HGB 14.1 07/29/2019   PLT 255 07/29/2019   NA 139 07/29/2019    K 3.7 07/29/2019   CL 105 07/29/2019   CO2 26 07/29/2019   GLUCOSE 85 07/29/2019   BUN 11 07/29/2019   CREATININE 0.78 07/29/2019   BILITOT 0.4 07/29/2019   ALKPHOS 54 01/18/2019   AST 16 07/29/2019   ALT 11 07/29/2019   PROT 8.0 07/29/2019   PROT 8.0 07/29/2019   ALBUMIN 4.7 01/18/2019   CALCIUM 9.5 07/29/2019   GFRAA 119 07/29/2019   QFTBGOLDPLUS NEGATIVE 07/29/2019    Speciality Comments: PLQ eye exam: 05/18/2020 normal. [facility]. Follow up in 1 year.  Procedures:  No procedures performed Allergies: Lactose intolerance (gi)     Assessment / Plan:     Visit Diagnoses: Autoimmune disease ([place]) - Positive ANA, positive SCL 70, aCL 42, fatigue, inflammatory arthritis (pictures noted on the cell phone showed MCP, left ankle joint swelling and rash in the past): She has not had any signs or symptoms of a systemic autoimmune disease flare recently.  She is clinically doing well taking Plaquenil 200 mg 1 tablet by mouth twice daily.  She continues to tolerate Plaquenil without any side effects and has not missed any doses recently.  She is currently [redacted] weeks pregnant and has been following along with her OB/GYN and MFM closely.  Due to her history of anticardiolipin IgG being a weak positive x2 she was started on aspirin 81 mg by mouth daily.  She has not had any symptoms of Raynaud's, rashes, hair loss, or photosensitivity.  We discussed the importance of avoiding direct sun exposure and wearing sunscreen SPF greater than 50 on a daily basis. She has not had any oral or nasal ulcerations.  She has chronic eye dryness and uses systane eyedrops for symptomatic relief.  She was evaluated by her ophthalmologist recently on 05/18/20. She has not had any palpitations, SOB, or pleuritic chest pain.  She continues to experience intermittent pain in both hands and both elbow joints.  She has noticed some increased tightness and stiffness in her hands but no synovitis was noted on examination today.   She has been taking Tylenol at bedtime as needed for pain relief.  She will  remain on Plaquenil as prescribed throughout her pregnancy.  A refill of pLQ was sent to the pharmacy today.  She was advised to notify us if she develops any new or worsening symptoms.  She will follow-up in the office in 5 months. Lab work from 03/08/20 was reviewed with the patient today in the office: ESR 2, ANA+, dsDNA negative, C3 and C4 WNL, RPR-, HIV-, HCV ab-.  She was given orders to repeat CBC with diff, CMP with GFR, ESR, complements, and dsDNA with lab work in May.    - Plan: hydroxychloroquine (PLAQUENIL) 200 MG tablet  High risk medication use - Plaquenil 200 mg 1 tablet by mouth twice daily. PLQ eye exam: 05/18/2020 normal. [facility]. Follow up in 1 year.  CBC and CMP were drawn on 03/08/2020.  She will be due to update lab work in May and every 5 months to monitor for drug toxicity.  Orders for CBC with differential and CMP with GFR were provided to the patient today.  Anticardiolipin antibody affecting pregnancy, antepartum: She is currently [redacted] weeks pregnant and is being followed by MFM closely.  She was started on aspirin 81 mg 1 tablet by mouth daily.  She will continue on PLQ as prescribed.   Pain of both elbows: She has good ROM of both elbow joints.  Tenderness to palpation along the joint line.  No inflammation or olecranon bursitis noted.  No flexion contracture noted. She has been taking tylenol as needed for pain relief.   Chondromalacia of both patellae: She has good ROM of both knee joints with no discomfort.  No warmth or effusion noted.   [redacted] weeks gestation of pregnancy: She has been following along closely with MFM and her OB-GYN.  She plans on continuing on Plaquenil throughout her pregnancy.  Other medical conditions are listed as follows:   Generalized anxiety disorder  History of migraine  Panic disorder  History of IBS  Family history of Crohn's disease  Family history of  ulcerative colitis   Orders: No orders of the defined types were placed in this encounter.  Meds ordered this encounter  Medications  . hydroxychloroquine (PLAQUENIL) 200 MG tablet    Sig: Take 1 tablet (200 mg total) by mouth 2 (two) times daily.    Dispense:  180 tablet    Refill:  0      Follow-Up Instructions: Return in about 5 months (around 11/15/2020) for Autoimmune Disease.   Ofilia Neas, PA-C  Note - This record has been created using Dragon software.  Chart creation errors have been sought, but may not always  have been located. Such creation errors do not reflect on  the standard of medical care.

## 2020-06-15 ENCOUNTER — Ambulatory Visit (INDEPENDENT_AMBULATORY_CARE_PROVIDER_SITE_OTHER): Payer: BC Managed Care – PPO | Admitting: Physician Assistant

## 2020-06-15 ENCOUNTER — Encounter: Payer: Self-pay | Admitting: Physician Assistant

## 2020-06-15 ENCOUNTER — Other Ambulatory Visit: Payer: Self-pay

## 2020-06-15 VITALS — BP 115/80 | HR 97 | Resp 14 | Ht 69.0 in | Wt 236.0 lb

## 2020-06-15 DIAGNOSIS — Z8719 Personal history of other diseases of the digestive system: Secondary | ICD-10-CM

## 2020-06-15 DIAGNOSIS — M359 Systemic involvement of connective tissue, unspecified: Secondary | ICD-10-CM | POA: Diagnosis not present

## 2020-06-15 DIAGNOSIS — M25522 Pain in left elbow: Secondary | ICD-10-CM

## 2020-06-15 DIAGNOSIS — Z79899 Other long term (current) drug therapy: Secondary | ICD-10-CM

## 2020-06-15 DIAGNOSIS — F410 Panic disorder [episodic paroxysmal anxiety] without agoraphobia: Secondary | ICD-10-CM

## 2020-06-15 DIAGNOSIS — M25521 Pain in right elbow: Secondary | ICD-10-CM | POA: Diagnosis not present

## 2020-06-15 DIAGNOSIS — Z8379 Family history of other diseases of the digestive system: Secondary | ICD-10-CM

## 2020-06-15 DIAGNOSIS — R760 Raised antibody titer: Secondary | ICD-10-CM

## 2020-06-15 DIAGNOSIS — M2242 Chondromalacia patellae, left knee: Secondary | ICD-10-CM

## 2020-06-15 DIAGNOSIS — M2241 Chondromalacia patellae, right knee: Secondary | ICD-10-CM

## 2020-06-15 DIAGNOSIS — O99891 Other specified diseases and conditions complicating pregnancy: Secondary | ICD-10-CM | POA: Diagnosis not present

## 2020-06-15 DIAGNOSIS — F411 Generalized anxiety disorder: Secondary | ICD-10-CM

## 2020-06-15 DIAGNOSIS — Z8669 Personal history of other diseases of the nervous system and sense organs: Secondary | ICD-10-CM

## 2020-06-15 DIAGNOSIS — Z3A22 22 weeks gestation of pregnancy: Secondary | ICD-10-CM

## 2020-06-15 MED ORDER — HYDROXYCHLOROQUINE SULFATE 200 MG PO TABS: 200.0000 mg | ORAL_TABLET | Freq: Two times a day (BID) | ORAL | 0 refills | Status: DC

## 2020-06-19 ENCOUNTER — Ambulatory Visit: Payer: Self-pay

## 2020-06-19 ENCOUNTER — Ambulatory Visit: Payer: BC Managed Care – PPO

## 2020-06-21 DIAGNOSIS — O9212 Cracked nipple associated with the puerperium: Secondary | ICD-10-CM | POA: Diagnosis not present

## 2020-06-21 DIAGNOSIS — Z3A23 23 weeks gestation of pregnancy: Secondary | ICD-10-CM | POA: Diagnosis not present

## 2020-06-21 DIAGNOSIS — O99112 Other diseases of the blood and blood-forming organs and certain disorders involving the immune mechanism complicating pregnancy, second trimester: Secondary | ICD-10-CM | POA: Diagnosis not present

## 2020-07-08 ENCOUNTER — Other Ambulatory Visit: Payer: Self-pay | Admitting: Rheumatology

## 2020-07-08 DIAGNOSIS — M359 Systemic involvement of connective tissue, unspecified: Secondary | ICD-10-CM

## 2020-07-11 NOTE — Telephone Encounter (Signed)
Reviewed message and photo with Dr. Corliss Skains.  Dr. Corliss Skains will discuss with the patient tomorrow when she is back in the office. Her treatment options are limited due to being [redacted] weeks pregnant. Please notify the patient.

## 2020-07-18 DIAGNOSIS — Z3480 Encounter for supervision of other normal pregnancy, unspecified trimester: Secondary | ICD-10-CM | POA: Diagnosis not present

## 2020-07-18 DIAGNOSIS — O99112 Other diseases of the blood and blood-forming organs and certain disorders involving the immune mechanism complicating pregnancy, second trimester: Secondary | ICD-10-CM | POA: Diagnosis not present

## 2020-07-18 DIAGNOSIS — Z3A27 27 weeks gestation of pregnancy: Secondary | ICD-10-CM | POA: Diagnosis not present

## 2020-08-04 ENCOUNTER — Telehealth: Payer: Self-pay

## 2020-08-04 NOTE — Telephone Encounter (Signed)
Lab results received via fax from [facility].   Date of lab draw: 07/18/2020   CBC w/diff  WBC 10.9 Neutrophils (absolute) 8.5  HIV AB/P24 AG with reflex Non-reactive   Gestational diabetes 1 hour screen 70   Date of lab draw: 03/24/2020  Cardiolipin antibodies  acl IgG 28   Labs were reviewed by Dr. Loni Beckwith and document sent to the scan center.

## 2020-08-15 ENCOUNTER — Ambulatory Visit: Payer: Self-pay | Admitting: Neurology

## 2020-08-15 DIAGNOSIS — O99113 Other diseases of the blood and blood-forming organs and certain disorders involving the immune mechanism complicating pregnancy, third trimester: Secondary | ICD-10-CM | POA: Diagnosis not present

## 2020-08-15 DIAGNOSIS — Z3A31 31 weeks gestation of pregnancy: Secondary | ICD-10-CM | POA: Diagnosis not present

## 2020-08-15 DIAGNOSIS — O3663X0 Maternal care for excessive fetal growth, third trimester, not applicable or unspecified: Secondary | ICD-10-CM | POA: Diagnosis not present

## 2020-08-17 ENCOUNTER — Ambulatory Visit (INDEPENDENT_AMBULATORY_CARE_PROVIDER_SITE_OTHER): Payer: BC Managed Care – PPO | Admitting: Neurology

## 2020-08-17 DIAGNOSIS — G43711 Chronic migraine without aura, intractable, with status migrainosus: Secondary | ICD-10-CM

## 2020-08-17 NOTE — Progress Notes (Signed)
Botox- 200 units x 1 vial Lot: [id number] Expiration: 03/2023 NDC: 0023-3921-02  Bacteriostatic 0.9% Sodium Chloride- 4mL total Lot: [id number] Expiration: 08/16/2021 NDC: 0409-1966-02  Dx: G43.711 B/B  

## 2020-08-17 NOTE — Progress Notes (Signed)
Consent Form Botulism Toxin Injection For Chronic Migraine  05/15/2020: discussed risks of botox in pregnancy. +a. +10u masseters bid.  12/16/2019: +10u bid masseters.  Doing great. Trying to get pregnant. Loved the masseters. Only had 5 headaches the last 2 weeks whie wearing off which is fantastic for her and int he setting of wearing off is great. > 70% improvement in severity and frequency. +a - masseters please ak next time and we may include masseters. On ajvy she did great, . The Ajovy has helped tremendously. The other migraines have been better and easier to treat. [name] helps but doesn't "kick it". The migraines that are the worst are around her menses. She is working with her OB for birth control. She has been taking the minipill continuosly but not working. She was just switched on Sunday to Lo-Loestrin continuously and that is helping. We discussed she is going to get pregnant, Ajovy is contraindicated as are the cgrps, we can start botox.. After a long discussion, decided on botox, discussed risks in pregnancy, also want her to discuss with her obgyn, titrate off of zonegran. NOT ON AJOVY   Medications tried: topamax, maxalt, zoloft, imitrex, zonegran, propranolol, zofran,  Amitriptyline,flexeril  Reviewed orally with patient, additionally signature is on file:  Botulism toxin has been approved by the Federal drug administration for treatment of chronic migraine. Botulism toxin does not cure chronic migraine and it may not be effective in some patients.  The administration of botulism toxin is accomplished by injecting a small amount of toxin into the muscles of the neck and head. Dosage must be titrated for each individual. Any benefits resulting from botulism toxin tend to wear off after 3 months with a repeat injection required if benefit is to be maintained. Injections are usually done every 3-4 months with maximum effect peak achieved by about 2 or 3 weeks. Botulism toxin is expensive  and you should be sure of what costs you will incur resulting from the injection.  The side effects of botulism toxin use for chronic migraine may include:   -Transient, and usually mild, facial weakness with facial injections  -Transient, and usually mild, head or neck weakness with head/neck injections  -Reduction or loss of forehead facial animation due to forehead muscle weakness  -Eyelid drooping  -Dry eye  -Pain at the site of injection or bruising at the site of injection  -Double vision  -Potential unknown long term risks  Contraindications: You should not have Botox if you are pregnant, nursing, allergic to albumin, have an infection, skin condition, or muscle weakness at the site of the injection, or have myasthenia gravis, Lambert-Eaton syndrome, or ALS.  It is also possible that as with any injection, there may be an allergic reaction or no effect from the medication. Reduced effectiveness after repeated injections is sometimes seen and rarely infection at the injection site may occur. All care will be taken to prevent these side effects. If therapy is given over a long time, atrophy and wasting in the muscle injected may occur. Occasionally the patient's become refractory to treatment because they develop antibodies to the toxin. In this event, therapy needs to be modified.  I have read the above information and consent to the administration of botulism toxin.    BOTOX PROCEDURE NOTE FOR MIGRAINE HEADACHE    Contraindications and precautions discussed with patient(above). Aseptic procedure was observed and patient tolerated procedure. Procedure performed by Dr. Artemio Aly  The condition has existed for more than 6 months,  and pt does not have a diagnosis of ALS, Myasthenia Gravis or Lambert-Eaton Syndrome.  Risks and benefits of injections discussed and pt agrees to proceed with the procedure.  Written consent obtained  These injections are medically necessary. Pt  receives  good benefits from these injections. These injections do not cause sedations or hallucinations which the oral therapies may cause.  Description of procedure:  The patient was placed in a sitting position. The standard protocol was used for Botox as follows, with 5 units of Botox injected at each site:   -Procerus muscle, midline injection  -Corrugator muscle, bilateral injection  -Frontalis muscle, bilateral injection, with 2 sites each side, medial injection was performed in the upper one third of the frontalis muscle, in the region vertical from the medial inferior edge of the superior orbital rim. The lateral injection was again in the upper one third of the forehead vertically above the lateral limbus of the cornea, 1.5 cm lateral to the medial injection site.  -Temporalis muscle injection, 4 sites, bilaterally. The first injection was 3 cm above the tragus of the ear, second injection site was 1.5 cm to 3 cm up from the first injection site in line with the tragus of the ear. The third injection site was 1.5-3 cm forward between the first 2 injection sites. The fourth injection site was 1.5 cm posterior to the second injection site.   -Occipitalis muscle injection, 3 sites, bilaterally. The first injection was done one half way between the occipital protuberance and the tip of the mastoid process behind the ear. The second injection site was done lateral and superior to the first, 1 fingerbreadth from the first injection. The third injection site was 1 fingerbreadth superiorly and medially from the first injection site.  -Cervical paraspinal muscle injection, 2 sites, bilateral knee first injection site was 1 cm from the midline of the cervical spine, 3 cm inferior to the lower border of the occipital protuberance. The second injection site was 1.5 cm superiorly and laterally to the first injection site.  -Trapezius muscle injection was performed at 3 sites, bilaterally. The first injection  site was in the upper trapezius muscle halfway between the inflection point of the neck, and the acromion. The second injection site was one half way between the acromion and the first injection site. The third injection was done between the first injection site and the inflection point of the neck.   Will return for repeat injection in 3 months.   200 units of Botox was used, any Botox not injected was wasted. The patient tolerated the procedure well, there were no complications of the above procedure.

## 2020-08-24 DIAGNOSIS — O99891 Other specified diseases and conditions complicating pregnancy: Secondary | ICD-10-CM | POA: Diagnosis not present

## 2020-08-24 DIAGNOSIS — Z3A32 32 weeks gestation of pregnancy: Secondary | ICD-10-CM | POA: Diagnosis not present

## 2020-09-04 DIAGNOSIS — O99113 Other diseases of the blood and blood-forming organs and certain disorders involving the immune mechanism complicating pregnancy, third trimester: Secondary | ICD-10-CM | POA: Diagnosis not present

## 2020-09-04 DIAGNOSIS — Z3A34 34 weeks gestation of pregnancy: Secondary | ICD-10-CM | POA: Diagnosis not present

## 2020-09-13 DIAGNOSIS — O3663X0 Maternal care for excessive fetal growth, third trimester, not applicable or unspecified: Secondary | ICD-10-CM | POA: Diagnosis not present

## 2020-09-13 DIAGNOSIS — Z3A35 35 weeks gestation of pregnancy: Secondary | ICD-10-CM | POA: Diagnosis not present

## 2020-09-13 DIAGNOSIS — O99113 Other diseases of the blood and blood-forming organs and certain disorders involving the immune mechanism complicating pregnancy, third trimester: Secondary | ICD-10-CM | POA: Diagnosis not present

## 2020-09-20 DIAGNOSIS — Z3685 Encounter for antenatal screening for Streptococcus B: Secondary | ICD-10-CM | POA: Diagnosis not present

## 2020-09-20 DIAGNOSIS — Z3A36 36 weeks gestation of pregnancy: Secondary | ICD-10-CM | POA: Diagnosis not present

## 2020-09-20 DIAGNOSIS — O09893 Supervision of other high risk pregnancies, third trimester: Secondary | ICD-10-CM | POA: Diagnosis not present

## 2020-09-20 DIAGNOSIS — O99113 Other diseases of the blood and blood-forming organs and certain disorders involving the immune mechanism complicating pregnancy, third trimester: Secondary | ICD-10-CM | POA: Diagnosis not present

## 2020-09-25 LAB — OB RESULTS CONSOLE GBS: GBS: NEGATIVE

## 2020-09-28 DIAGNOSIS — Z3A37 37 weeks gestation of pregnancy: Secondary | ICD-10-CM | POA: Diagnosis not present

## 2020-09-28 DIAGNOSIS — O99119 Other diseases of the blood and blood-forming organs and certain disorders involving the immune mechanism complicating pregnancy, unspecified trimester: Secondary | ICD-10-CM | POA: Diagnosis not present

## 2020-09-29 DIAGNOSIS — O368130 Decreased fetal movements, third trimester, not applicable or unspecified: Secondary | ICD-10-CM | POA: Diagnosis not present

## 2020-09-29 DIAGNOSIS — Z3A37 37 weeks gestation of pregnancy: Secondary | ICD-10-CM | POA: Diagnosis not present

## 2020-10-02 ENCOUNTER — Encounter (HOSPITAL_COMMUNITY): Payer: Self-pay | Admitting: Obstetrics and Gynecology

## 2020-10-02 ENCOUNTER — Inpatient Hospital Stay (HOSPITAL_COMMUNITY)
Admission: AD | Admit: 2020-10-02 | Discharge: 2020-10-05 | DRG: 806 | Disposition: A | Discharge status: Home / Self Care | Payer: BC Managed Care – PPO | Attending: Obstetrics and Gynecology | Admitting: Obstetrics and Gynecology

## 2020-10-02 ENCOUNTER — Other Ambulatory Visit: Payer: Self-pay

## 2020-10-02 DIAGNOSIS — Z3A38 38 weeks gestation of pregnancy: Secondary | ICD-10-CM

## 2020-10-02 DIAGNOSIS — O99344 Other mental disorders complicating childbirth: Secondary | ICD-10-CM | POA: Diagnosis present

## 2020-10-02 DIAGNOSIS — O411230 Chorioamnionitis, third trimester, not applicable or unspecified: Secondary | ICD-10-CM | POA: Diagnosis not present

## 2020-10-02 DIAGNOSIS — F419 Anxiety disorder, unspecified: Secondary | ICD-10-CM | POA: Diagnosis present

## 2020-10-02 DIAGNOSIS — O9912 Other diseases of the blood and blood-forming organs and certain disorders involving the immune mechanism complicating childbirth: Principal | ICD-10-CM | POA: Diagnosis present

## 2020-10-02 DIAGNOSIS — Z3A00 Weeks of gestation of pregnancy not specified: Secondary | ICD-10-CM | POA: Diagnosis not present

## 2020-10-02 DIAGNOSIS — Z8616 Personal history of COVID-19: Secondary | ICD-10-CM | POA: Diagnosis not present

## 2020-10-02 DIAGNOSIS — D6861 Antiphospholipid syndrome: Secondary | ICD-10-CM | POA: Diagnosis not present

## 2020-10-02 DIAGNOSIS — O99214 Obesity complicating childbirth: Secondary | ICD-10-CM | POA: Diagnosis not present

## 2020-10-02 DIAGNOSIS — O368130 Decreased fetal movements, third trimester, not applicable or unspecified: Secondary | ICD-10-CM | POA: Diagnosis not present

## 2020-10-02 DIAGNOSIS — O43813 Placental infarction, third trimester: Secondary | ICD-10-CM | POA: Diagnosis not present

## 2020-10-02 DIAGNOSIS — Z23 Encounter for immunization: Secondary | ICD-10-CM | POA: Diagnosis not present

## 2020-10-02 DIAGNOSIS — D68312 Antiphospholipid antibody with hemorrhagic disorder: Secondary | ICD-10-CM | POA: Diagnosis not present

## 2020-10-02 LAB — COMPREHENSIVE METABOLIC PANEL
ALT: 19 U/L (ref 0–44)
AST: 25 U/L (ref 15–41)
Albumin: 3.4 g/dL — ABNORMAL LOW (ref 3.5–5.0)
Alkaline Phosphatase: 113 U/L (ref 38–126)
Anion gap: 11 (ref 5–15)
BUN: <5 mg/dL — ABNORMAL LOW (ref 6–20)
CO2: 24 mmol/L (ref 22–32)
Calcium: 9.4 mg/dL (ref 8.9–10.3)
Chloride: 100 mmol/L (ref 98–111)
Creatinine, Ser: 0.64 mg/dL (ref 0.44–1.00)
GFR, Estimated: >60 mL/min (ref >60)
Glucose, Bld: 106 mg/dL — ABNORMAL HIGH (ref 70–99)
Potassium: 3.7 mmol/L (ref 3.5–5.1)
Sodium: 135 mmol/L (ref 135–145)
Total Bilirubin: 0.2 mg/dL — ABNORMAL LOW (ref 0.3–1.2)
Total Protein: 6.8 g/dL (ref 6.5–8.1)

## 2020-10-02 LAB — TYPE AND SCREEN
ABO/RH(D): A POS
Antibody Screen: NEGATIVE

## 2020-10-02 LAB — CBC
HCT: 40.1 % (ref 36.0–46.0)
Hemoglobin: 13.1 g/dL (ref 12.0–15.0)
MCH: 30.5 pg (ref 26.0–34.0)
MCHC: 32.7 g/dL (ref 30.0–36.0)
MCV: 93.5 fL (ref 80.0–100.0)
Platelets: 236 10*3/uL (ref 150–400)
RBC: 4.29 MIL/uL (ref 3.87–5.11)
RDW: 13.2 % (ref 11.5–15.5)
WBC: 11.4 10*3/uL — ABNORMAL HIGH (ref 4.0–10.5)
nRBC: 0 % (ref 0.0–0.2)

## 2020-10-02 LAB — RESP PANEL BY RT-PCR (FLU A&B, COVID) ARPGX2
Influenza A by PCR: NEGATIVE
Influenza B by PCR: NEGATIVE
SARS Coronavirus 2 by RT PCR: NEGATIVE

## 2020-10-02 MED ORDER — TERBUTALINE SULFATE 1 MG/ML IJ SOLN: 0.2500 mg | Freq: Once | INTRAMUSCULAR | Status: DC | PRN

## 2020-10-02 MED ORDER — LACTATED RINGERS IV SOLN: INTRAVENOUS | Status: DC

## 2020-10-02 MED ORDER — SERTRALINE HCL 100 MG PO TABS
100.0000 mg | ORAL_TABLET | Freq: Every day | ORAL | Status: DC
  Administered 2020-10-02: 100 mg via ORAL
  Filled 2020-10-02 (×2): qty 1

## 2020-10-02 MED ORDER — FENTANYL CITRATE (PF) 100 MCG/2ML IJ SOLN: 50.0000 ug | INTRAMUSCULAR | Status: DC | PRN

## 2020-10-02 MED ORDER — LIDOCAINE HCL (PF) 1 % IJ SOLN: 30.0000 mL | INTRAMUSCULAR | Status: DC | PRN

## 2020-10-02 MED ORDER — ONDANSETRON HCL 4 MG/2ML IJ SOLN
4.0000 mg | Freq: Four times a day (QID) | INTRAMUSCULAR | Status: DC | PRN
  Administered 2020-10-03: 4 mg via INTRAVENOUS
  Filled 2020-10-02: qty 2

## 2020-10-02 MED ORDER — HYDROXYZINE HCL 50 MG PO TABS: 50.0000 mg | ORAL_TABLET | Freq: Four times a day (QID) | ORAL | Status: DC | PRN

## 2020-10-02 MED ORDER — BUSPIRONE HCL 10 MG PO TABS
20.0000 mg | ORAL_TABLET | Freq: Two times a day (BID) | ORAL | Status: DC
  Administered 2020-10-02 – 2020-10-03 (×2): 20 mg via ORAL
  Filled 2020-10-02 (×4): qty 2

## 2020-10-02 MED ORDER — ACETAMINOPHEN 325 MG PO TABS: 650.0000 mg | ORAL_TABLET | ORAL | Status: DC | PRN

## 2020-10-02 MED ORDER — BUSPIRONE HCL 10 MG PO TABS
10.0000 mg | ORAL_TABLET | Freq: Two times a day (BID) | ORAL | Status: DC
  Filled 2020-10-02: qty 1

## 2020-10-02 MED ORDER — MISOPROSTOL 25 MCG QUARTER TABLET
25.0000 ug | ORAL_TABLET | ORAL | Status: DC | PRN
  Administered 2020-10-02 – 2020-10-03 (×3): 25 ug via VAGINAL
  Filled 2020-10-02 (×3): qty 1

## 2020-10-02 MED ORDER — SOD CITRATE-CITRIC ACID 500-334 MG/5ML PO SOLN: 30.0000 mL | ORAL | Status: DC | PRN

## 2020-10-02 MED ORDER — LACTATED RINGERS IV SOLN
500.0000 mL | INTRAVENOUS | Status: DC | PRN
Start: 2020-10-02 — End: 2020-10-03

## 2020-10-02 MED ORDER — FLEET ENEMA 7-19 GM/118ML RE ENEM: 1.0000 | ENEMA | RECTAL | Status: DC | PRN

## 2020-10-02 MED ORDER — OXYTOCIN BOLUS FROM INFUSION
333.0000 mL | Freq: Once | INTRAVENOUS | Status: AC
  Administered 2020-10-03: 333 mL via INTRAVENOUS

## 2020-10-02 MED ORDER — HYDROXYCHLOROQUINE SULFATE 200 MG PO TABS
200.0000 mg | ORAL_TABLET | Freq: Two times a day (BID) | ORAL | Status: DC
  Administered 2020-10-02 – 2020-10-03 (×2): 200 mg via ORAL
  Filled 2020-10-02 (×5): qty 1

## 2020-10-02 MED ORDER — OXYCODONE-ACETAMINOPHEN 5-325 MG PO TABS: 2.0000 | ORAL_TABLET | ORAL | Status: DC | PRN

## 2020-10-02 MED ORDER — OXYTOCIN-SODIUM CHLORIDE 30-0.9 UT/500ML-% IV SOLN
2.5000 [IU]/h | INTRAVENOUS | Status: DC
  Filled 2020-10-02 (×2): qty 500

## 2020-10-02 MED ORDER — OXYCODONE-ACETAMINOPHEN 5-325 MG PO TABS: 1.0000 | ORAL_TABLET | ORAL | Status: DC | PRN

## 2020-10-02 NOTE — H&P (Signed)
Jennifer Vincent is a 30 y.o. female presenting for induction of labor. Pregnancy complicated by antiphospholipid syndrome with positive anticardiolipin AB, takes Plaquenil per Dr Jennifer Vincent. MFM consult done. If C/S needs Lovenox PP. Anxiety disorder on sertraline and buspar prn. Hx of LGA 9# 4oz vaginal delivery. Hx of migraine HA. Covid positive 1/22. Covid vaccine Pfizer x 3. U/S in office 37 3/7 wks>EFW 7# 2oz, AFI 14 and BPP 6/8 (no sustained breathing) but NST reactive>8/10. Repeat BPP @ 37 4/7 again 8/10. Today in office BPP 6/8 (no sustained breathing) and NST had accels but did not meet criteria for reactive, therefore total > 6/10. OB History     Gravida  2   Para  1   Term  1   Preterm      AB      Living  1      SAB      IAB      Ectopic      Multiple  0   Live Births  1          Past Medical History:  Diagnosis Date   Allergic rhinitis    Anxiety    Autoimmune disorder (HCC)    nonspecified   COVID 04/05/2020   Frequent headaches    IBS (irritable bowel syndrome)    Migraine    PCOS (polycystic ovarian syndrome)    Past Surgical History:  Procedure Laterality Date   TONSILLECTOMY     Family History: family history includes Alcohol abuse in her brother; Anxiety disorder in her brother and maternal grandfather; Arthritis in her brother and father; Breast cancer in her maternal grandmother; Crohn's disease in her brother and father; Depression in her brother and mother; Diabetes in her paternal grandfather; Eating disorder in her mother; Healthy in her daughter; Heart attack in her paternal grandfather; Heart disease in her paternal grandfather; Mental illness in her brother and maternal grandfather; OCD in her maternal grandfather; Ulcerative colitis in her father. Social History:  reports that she has never smoked. She has never used smokeless tobacco. She reports previous alcohol use. She reports that she does not use drugs.     Maternal Diabetes:  No Genetic Screening: Normal Maternal Ultrasounds/Referrals: Normal Fetal Ultrasounds or other Referrals:  Referred to Materal Fetal Medicine  Maternal Substance Abuse:  No Significant Maternal Medications:  Meds include: Other: sertraline, Buspar, Plaquenil Significant Maternal Lab Results:  Group B Strep negative Other Comments:  None  Review of Systems  Constitutional:  Negative for fever.  Eyes:  Negative for visual disturbance.  Gastrointestinal:  Negative for abdominal pain.  Neurological:  Negative for headaches.  Maternal Medical History:  Fetal activity: Perceived fetal activity is normal.    Dilation: 2 Effacement (%): 50 Station: -3 Exam by:: Jennifer [place] RN Blood pressure 119/67, pulse (!) 101, temperature 98.1 F (36.7 C), temperature source Axillary, resp. rate 16, height 5\' 9"  (1.753 m), weight 114.5 kg, last menstrual period 01/11/2020. Maternal Exam:  Abdomen: Fetal presentation: vertex  Physical Exam Cardiovascular:     Rate and Rhythm: Normal rate.  Pulmonary:     Effort: Pulmonary effort is normal.    Prenatal labs: ABO, Rh: --/--/A POS (07/18 1553) Antibody: NEG (07/18 1553) Rubella:   RPR:    HBsAg:    HIV:    GBS:   negative 09/20/20  Assessment/Plan: 30 yo G2P1 @ 38 0/7 weeks  Antiphospholipid Syndrome - Lovenox PO if cesarean section Equivocal BPP Two stage IOL discussed-risks reviewed She  states she understands and agrees.   Jennifer Vincent II 10/02/2020, 5:07 PM

## 2020-10-03 ENCOUNTER — Inpatient Hospital Stay (HOSPITAL_COMMUNITY): Payer: BC Managed Care – PPO | Admitting: Anesthesiology

## 2020-10-03 ENCOUNTER — Encounter (HOSPITAL_COMMUNITY): Payer: Self-pay | Admitting: Obstetrics and Gynecology

## 2020-10-03 LAB — RPR: RPR Ser Ql: NONREACTIVE

## 2020-10-03 MED ORDER — DIBUCAINE (PERIANAL) 1 % EX OINT: 1.0000 "application " | TOPICAL_OINTMENT | CUTANEOUS | Status: DC | PRN

## 2020-10-03 MED ORDER — OXYCODONE HCL 5 MG PO TABS: 5.0000 mg | ORAL_TABLET | ORAL | Status: DC | PRN

## 2020-10-03 MED ORDER — PRENATAL MULTIVITAMIN CH
1.0000 | ORAL_TABLET | Freq: Every day | ORAL | Status: DC
  Administered 2020-10-04: 1 via ORAL
  Filled 2020-10-03: qty 1

## 2020-10-03 MED ORDER — SERTRALINE HCL 100 MG PO TABS
100.0000 mg | ORAL_TABLET | Freq: Every day | ORAL | Status: DC
  Administered 2020-10-03 – 2020-10-04 (×2): 100 mg via ORAL
  Filled 2020-10-03 (×2): qty 1

## 2020-10-03 MED ORDER — LACTATED RINGERS IV SOLN: 500.0000 mL | Freq: Once | INTRAVENOUS | Status: DC

## 2020-10-03 MED ORDER — DIPHENHYDRAMINE HCL 25 MG PO CAPS: 25.0000 mg | ORAL_CAPSULE | Freq: Four times a day (QID) | ORAL | Status: DC | PRN

## 2020-10-03 MED ORDER — OXYCODONE HCL 5 MG PO TABS
10.0000 mg | ORAL_TABLET | ORAL | Status: DC | PRN
Start: 2020-10-03 — End: 2020-10-05

## 2020-10-03 MED ORDER — SIMETHICONE 80 MG PO CHEW: 80.0000 mg | CHEWABLE_TABLET | ORAL | Status: DC | PRN

## 2020-10-03 MED ORDER — SOD CITRATE-CITRIC ACID 500-334 MG/5ML PO SOLN
ORAL | Status: AC
  Filled 2020-10-03: qty 30

## 2020-10-03 MED ORDER — EPHEDRINE 5 MG/ML INJ: 10.0000 mg | INTRAVENOUS | Status: DC | PRN

## 2020-10-03 MED ORDER — LIDOCAINE HCL (PF) 1 % IJ SOLN
INTRAMUSCULAR | Status: DC | PRN
  Administered 2020-10-03: 4 mL via EPIDURAL
  Administered 2020-10-03: 5 mL via EPIDURAL

## 2020-10-03 MED ORDER — COCONUT OIL OIL: 1.0000 "application " | TOPICAL_OIL | Status: DC | PRN

## 2020-10-03 MED ORDER — DIPHENHYDRAMINE HCL 50 MG/ML IJ SOLN: 12.5000 mg | INTRAMUSCULAR | Status: DC | PRN

## 2020-10-03 MED ORDER — HYDROXYCHLOROQUINE SULFATE 200 MG PO TABS
200.0000 mg | ORAL_TABLET | Freq: Two times a day (BID) | ORAL | Status: DC
  Administered 2020-10-03 – 2020-10-05 (×4): 200 mg via ORAL
  Filled 2020-10-03 (×5): qty 1

## 2020-10-03 MED ORDER — TERBUTALINE SULFATE 1 MG/ML IJ SOLN: 0.2500 mg | Freq: Once | INTRAMUSCULAR | Status: DC | PRN

## 2020-10-03 MED ORDER — SENNOSIDES-DOCUSATE SODIUM 8.6-50 MG PO TABS
2.0000 | ORAL_TABLET | ORAL | Status: DC
  Administered 2020-10-04 – 2020-10-05 (×2): 2 via ORAL
  Filled 2020-10-03 (×2): qty 2

## 2020-10-03 MED ORDER — ZOLPIDEM TARTRATE 5 MG PO TABS: 5.0000 mg | ORAL_TABLET | Freq: Every evening | ORAL | Status: DC | PRN

## 2020-10-03 MED ORDER — PHENYLEPHRINE 40 MCG/ML (10ML) SYRINGE FOR IV PUSH (FOR BLOOD PRESSURE SUPPORT)
80.0000 ug | PREFILLED_SYRINGE | INTRAVENOUS | Status: DC | PRN
  Administered 2020-10-03: 80 ug via INTRAVENOUS
  Filled 2020-10-03: qty 10

## 2020-10-03 MED ORDER — BUSPIRONE HCL 10 MG PO TABS
20.0000 mg | ORAL_TABLET | Freq: Two times a day (BID) | ORAL | Status: DC
  Administered 2020-10-03 – 2020-10-05 (×4): 20 mg via ORAL
  Filled 2020-10-03 (×6): qty 2

## 2020-10-03 MED ORDER — FENTANYL-BUPIVACAINE-NACL 0.5-0.125-0.9 MG/250ML-% EP SOLN
12.0000 mL/h | EPIDURAL | Status: DC | PRN
  Administered 2020-10-03: 12 mL/h via EPIDURAL
  Filled 2020-10-03: qty 250

## 2020-10-03 MED ORDER — WITCH HAZEL-GLYCERIN EX PADS
1.0000 "application " | MEDICATED_PAD | CUTANEOUS | Status: DC | PRN
  Administered 2020-10-04: 1 via TOPICAL

## 2020-10-03 MED ORDER — PHENYLEPHRINE 40 MCG/ML (10ML) SYRINGE FOR IV PUSH (FOR BLOOD PRESSURE SUPPORT): 80.0000 ug | PREFILLED_SYRINGE | INTRAVENOUS | Status: DC | PRN

## 2020-10-03 MED ORDER — ONDANSETRON HCL 4 MG/2ML IJ SOLN: 4.0000 mg | INTRAMUSCULAR | Status: DC | PRN

## 2020-10-03 MED ORDER — ONDANSETRON HCL 4 MG PO TABS: 4.0000 mg | ORAL_TABLET | ORAL | Status: DC | PRN

## 2020-10-03 MED ORDER — ACETAMINOPHEN 325 MG PO TABS
650.0000 mg | ORAL_TABLET | ORAL | Status: DC | PRN
  Administered 2020-10-04: 650 mg via ORAL
  Filled 2020-10-03: qty 2

## 2020-10-03 MED ORDER — TETANUS-DIPHTH-ACELL PERTUSSIS 5-2.5-18.5 LF-MCG/0.5 IM SUSY: 0.5000 mL | PREFILLED_SYRINGE | Freq: Once | INTRAMUSCULAR | Status: DC

## 2020-10-03 MED ORDER — OXYTOCIN-SODIUM CHLORIDE 30-0.9 UT/500ML-% IV SOLN
1.0000 m[IU]/min | INTRAVENOUS | Status: DC
Start: 2020-10-03 — End: 2020-10-03
  Administered 2020-10-03: 2 m[IU]/min via INTRAVENOUS

## 2020-10-03 MED ORDER — IBUPROFEN 600 MG PO TABS
600.0000 mg | ORAL_TABLET | Freq: Four times a day (QID) | ORAL | Status: DC
  Administered 2020-10-03 – 2020-10-05 (×6): 600 mg via ORAL
  Filled 2020-10-03 (×6): qty 1

## 2020-10-03 MED ORDER — BENZOCAINE-MENTHOL 20-0.5 % EX AERO
1.0000 "application " | INHALATION_SPRAY | CUTANEOUS | Status: DC | PRN
  Administered 2020-10-04: 1 via TOPICAL
  Filled 2020-10-03 (×2): qty 56

## 2020-10-03 NOTE — Progress Notes (Signed)
Delivery Note At 8:16 PM a viable female was delivered via Vaginal, Spontaneous (Presentation: Right Occiput Anterior).  APGAR: , ; weight  .   Placenta status: intact ,  to path.  Cord: 3 vessels with the following complications: None.  Cord pH: pending  Anesthesia: Epidural Episiotomy:   Lacerations:  two small first degree lacs anterior, not bleeding, not repaired Suture Repair:  Est. Blood Loss (mL):  150  Mom to postpartum.  Baby to Couplet care / Skin to Skin.  Jennifer Vincent II 10/03/2020, 8:25 PM

## 2020-10-03 NOTE — Anesthesia Procedure Notes (Signed)
Epidural Patient location during procedure: OB Start time: 10/03/2020 12:31 PM End time: 10/03/2020 12:40 PM  Preanesthetic Checklist Completed: patient identified, IV checked, site marked, risks and benefits discussed, surgical consent, monitors and equipment checked, pre-op evaluation and timeout performed  Epidural Patient position: sitting Prep: DuraPrep and site prepped and draped Patient monitoring: continuous pulse ox and blood pressure Approach: midline Location: L4-L5 Injection technique: LOR air  Needle:  Needle type: Tuohy  Needle gauge: 17 G Needle length: 9 cm and 9 Needle insertion depth: 8 cm Catheter type: closed end flexible Catheter size: 19 Gauge Catheter at skin depth: 13 cm Test dose: negative and Other  Assessment Events: blood not aspirated, injection not painful, no injection resistance, no paresthesia and negative IV test  Additional Notes Patient identified. Risks and benefits discussed including failed block, incomplete  Pain control, post dural puncture headache, nerve damage, paralysis, blood pressure Changes, nausea, vomiting, reactions to medications-both toxic and allergic and post Partum back pain. All questions were answered. Patient expressed understanding and wished to proceed. Sterile technique was used throughout procedure. Epidural site was Dressed with sterile barrier dressing. No paresthesias, signs of intravascular injection Or signs of intrathecal spread were encountered.  Patient was more comfortable after the epidural was dosed. Please see RN's note for documentation of vital signs and FHR which are stable. Reason for block:procedure for pain

## 2020-10-03 NOTE — Progress Notes (Signed)
FHT cat one UCs q3-4 min Pitocin infusing

## 2020-10-03 NOTE — Anesthesia Preprocedure Evaluation (Addendum)
Anesthesia Evaluation  Patient identified by MRN, date of birth, ID band Patient awake    Reviewed: Allergy & Precautions, Patient's Chart, lab work & pertinent test results  Airway Mallampati: II  TM Distance: >3 FB Neck ROM: Full    Dental no notable dental hx. (+) Teeth Intact   Pulmonary  Covid 19, 03/26/20- recovered   Pulmonary exam normal breath sounds clear to auscultation       Cardiovascular negative cardio ROS Normal cardiovascular exam Rhythm:Regular Rate:Normal     Neuro/Psych  Headaches, PSYCHIATRIC DISORDERS Anxiety    GI/Hepatic negative GI ROS, Neg liver ROS,   Endo/Other  Obesity PCOS  Renal/GU negative Renal ROS  negative genitourinary   Musculoskeletal negative musculoskeletal ROS (+)   Abdominal (+) + obese,   Peds  Hematology negative hematology ROS (+) Antiphospholipid Ab syndrome   Anesthesia Other Findings   Reproductive/Obstetrics                            Anesthesia Physical Anesthesia Plan  ASA: 2  Anesthesia Plan: Epidural   Post-op Pain Management:    Induction:   PONV Risk Score and Plan:   Airway Management Planned: Natural Airway  Additional Equipment:   Intra-op Plan:   Post-operative Plan:   Informed Consent: I have reviewed the patients History and Physical, chart, labs and discussed the procedure including the risks, benefits and alternatives for the proposed anesthesia with the patient or authorized representative who has indicated his/her understanding and acceptance.       Plan Discussed with: Anesthesiologist  Anesthesia Plan Comments:         Anesthesia Quick Evaluation

## 2020-10-03 NOTE — Progress Notes (Signed)
S/P Cytotec x 3 Pitocin @ 4 mU Sitting on ball comfortable>decel to 60-90s noted Pitocin stopped, IV fluid bolus, position change When I arrived she is in knee-chest position and FHT 120-140s. Repositioned> Cx 4/80/-3/vtx FHT cat one A/P: D/W patient, will hold pitocin for 30-45 minutes. If FHT remains cat one will start pitocin again. D/W high station of vertex.

## 2020-10-03 NOTE — Lactation Note (Signed)
This note was copied from a baby's chart. Lactation Consultation Note  Patient Name: Jennifer Vincent Date: 10/03/2020 Reason for consult: L&D Initial assessment;Mother's request;Difficult latch;Term;Maternal endocrine disorder PCOS  Age:31 hours  RN in L and D had infant latched for 10 mins prior to [facility] arrival. LC assisted changing latch cradle to cross cradle. Infant arching back trying to sustain the latch due to short shafted nipples. LC instructed parents to offer drops of colostrum at breast parents asked for spoon. Infant sticking tongue to get drops of colostrum at the end of the visit.  LC to work mother on the floor.   Maternal Data Has patient been taught Hand Expression?: Yes Does the patient have breastfeeding experience prior to this delivery?: Yes How long did the patient breastfeed?: 3 weeks at the breast, 5 months with pumping and bottle feeding  Feeding Mother's Current Feeding Choice: Breast Milk  LATCH Score Latch: Repeated attempts needed to sustain latch, nipple held in mouth throughout feeding, stimulation needed to elicit sucking reflex.  Audible Swallowing: A few with stimulation  Type of Nipple: Flat  Comfort (Breast/Nipple): Soft / non-tender  Hold (Positioning): Assistance needed to correctly position infant at breast and maintain latch.  LATCH Score: 6   Lactation Tools Discussed/Used    Interventions Interventions: Breast feeding basics reviewed;Breast compression;Assisted with latch;Adjust position;Skin to skin;Support pillows;Breast massage;Hand express;Expressed milk;Education;Position options  Discharge    Consult Status Consult Status: Follow-up Date: 10/04/20 Follow-up type: In-patient    Cleona Doubleday  Nicholson-Springer 10/03/2020, 9:17 PM

## 2020-10-03 NOTE — Progress Notes (Signed)
This afternoon bedside U/S by me confirms vtx Pitocin now @ 20 mU FHT cat one Cx 4/80/-2, vtx now applied to cx D/W patient and husband options including AROM-risks discussed AROM>clear fluid IUPC placed

## 2020-10-03 NOTE — Anesthesia Postprocedure Evaluation (Signed)
Anesthesia Post Note  Patient: Jennifer Vincent  Procedure(s) Performed: AN AD HOC LABOR EPIDURAL     Patient location during evaluation: Mother Baby Anesthesia Type: Epidural Level of consciousness: awake and alert Pain management: pain level controlled Vital Signs Assessment: post-procedure vital signs reviewed and stable Respiratory status: spontaneous breathing, nonlabored ventilation and respiratory function stable Cardiovascular status: stable Postop Assessment: no headache, no backache and epidural receding Anesthetic complications: no   No notable events documented.  Last Vitals:  Vitals:   10/03/20 2046 10/03/20 2100  BP: 114/66 114/65  Pulse: (!) 111 (!) 102  Resp:    Temp:    SpO2:      Last Pain:  Vitals:   10/03/20 2031  TempSrc:   PainSc: 0-No pain   Pain Goal:                   Jennifer Vincent

## 2020-10-04 ENCOUNTER — Encounter (HOSPITAL_COMMUNITY): Payer: Self-pay | Admitting: Obstetrics and Gynecology

## 2020-10-04 LAB — CBC
HCT: 35.2 % — ABNORMAL LOW (ref 36.0–46.0)
Hemoglobin: 12 g/dL (ref 12.0–15.0)
MCH: 31.6 pg (ref 26.0–34.0)
MCHC: 34.1 g/dL (ref 30.0–36.0)
MCV: 92.6 fL (ref 80.0–100.0)
Platelets: 212 10*3/uL (ref 150–400)
RBC: 3.8 MIL/uL — ABNORMAL LOW (ref 3.87–5.11)
RDW: 13.3 % (ref 11.5–15.5)
WBC: 14.5 10*3/uL — ABNORMAL HIGH (ref 4.0–10.5)
nRBC: 0 % (ref 0.0–0.2)

## 2020-10-04 NOTE — Lactation Note (Signed)
This note was copied from a baby's chart. Lactation Consultation Note  Patient Name: Jennifer Vincent [id number] Date: 10/04/2020 Reason for consult: Follow-up assessment;Nipple pain/trauma Age:31 hours  P2, First child had difficulty sustaining latch and mother pumped for approx 5 mos. Mother seems to really want to breastfeed this child.  This baby is very eager to latch and demonstrating feeding cues.  With teacup hold and mother supporting her breast baby latched but after a few minutes, mother had pain and had to [place] baby.  Noted nipple compression and the beginning of a positional stripe.  Attempted to achieve more depth.  Oral assessment indicated labial frenulum tightness when flanged.  After on and off latching a few times, mother needed a break due to nipple soreness so FOB helped hand express and spoon feed baby.  Mother has good flow of colostrum and very willing to work on breastfeeding.  Provided coconut oil for soreness and suggest wearing shells once  Suggest calling for additional assistance later today.   Feeding Mother's Current Feeding Choice: Breast Milk  LATCH Score Latch: Repeated attempts needed to sustain latch, nipple held in mouth throughout feeding, stimulation needed to elicit sucking reflex.  Audible Swallowing: A few with stimulation  Type of Nipple: Everted at rest and after stimulation  Comfort (Breast/Nipple): Filling, red/small blisters or bruises, mild/mod discomfort  Hold (Positioning): Assistance needed to correctly position infant at breast and maintain latch.  LATCH Score: 6   Lactation Tools Discussed/Used Tools: Coconut oil;Shells  Interventions Interventions: Breast feeding basics reviewed;Assisted with latch;Skin to skin;Hand express;Adjust position;Support pillows;Coconut oil;Shells;Education  Discharge Pump: DEBP  Consult Status Consult Status: Follow-up Date: 10/04/20 Follow-up type: In-patient    Dahlia Byes  [facility] 10/04/2020, 8:11 AM

## 2020-10-04 NOTE — Progress Notes (Addendum)
Post Partum Day 1 Subjective: no complaints, up ad lib, voiding, and tolerating PO.  Patient is undecided re: desire for circ.    Objective: Blood pressure 112/76, pulse 79, temperature 98 F (36.7 C), temperature source Oral, resp. rate 17, height 5\' 9"  (1.753 m), weight 114.5 kg, last menstrual period 01/11/2020, SpO2 100 %, unknown if currently breastfeeding.  Physical Exam:  General: alert, cooperative, and appears stated age 30: appropriate Uterine Fundus: firm Incision: healing well, no significant drainage, no dehiscence DVT Evaluation: No evidence of DVT seen on physical exam. Negative Homan's sign. No cords or calf tenderness.  Recent Labs    10/02/20 1553 10/04/20 0507  HGB 13.1 12.0  HCT 40.1 35.2*    Assessment/Plan: Plan for discharge tomorrow Circ-patient to decide and will readdress on rounds tomorrow. APS-plan was for pp LMWH only in case of C/S.  OK to d/c.     LOS: 2 days   10/06/20 10/04/2020, 8:50 AM

## 2020-10-04 NOTE — Lactation Note (Addendum)
This note was copied from a baby's chart. Lactation Consultation Note  Patient Name: Jennifer Vincent [id number] Date: 10/04/2020 Reason for consult: Initial assessment;Early term 37-38.6wks Age:31 hours   Initial LC Consult:  Mother requested lactation assistance.  Mother breast fed her first child (now 71 years old) for 3 weeks.  She reported difficulty with baby sustaining a latch.  She continued pumping and bottle feeding for 5 months.  Mother informed me that her son already seems to be better with latching than her daughter was.  She feels "hopeful" that she can breast feed this baby.  Baby was asleep in mother's arms when I arrived.  Mother concerned that her son has not awakened for feeding.  He breast fed approximately 2 hours ago.  Reviewed basic breast feeding concepts with parents.  Mother is familiar with hand expression and I encouraged her to continue practicing and feed back colostrum drops to baby before/after feedings.  Offered to return for latch assistance when baby is showing cues.  Parents appreciative and wish to obtain sleep at this time.  Swaddled baby and placed him in the bassinet where he remained sleeping.     Maternal Data Has patient been taught Hand Expression?: Yes Does the patient have breastfeeding experience prior to this delivery?: Yes How long did the patient breastfeed?: 3 weeks  Feeding Mother's Current Feeding Choice: Breast Milk  LATCH Score                    Lactation Tools Discussed/Used    Interventions Interventions: Education  Discharge    Consult Status Consult Status: Follow-up Date: 10/04/20 Follow-up type: In-patient    Colletta Spillers R Kiamesha Samet 10/04/2020, 4:04 AM

## 2020-10-04 NOTE — Social Work (Signed)
MOB was referred for history of anxiety.   * Referral screened out by Clinical Social Worker because none of the following criteria appear to apply:  ~ History of anxiety/depression during this pregnancy, or of post-partum depression following prior delivery. No prenatal concerns noted. ~ Diagnosis of anxiety and/or depression within last 3 years. CSW reviewed chart and notes a diagnosis in 2018. OR * MOB's symptoms currently being treated with medication and/or therapy. MOB currently being treated with Sertraline 100mg  and Buspar 15mg  PRN.  Please contact the Clinical Social Worker if needs arise, by [facility] request, or if MOB scores greater than 9/yes to question 10 on [place] Postpartum Depression Screen.  08-17-1976, LCSWA Clinical Social Work 05-24-1978 and Manfred Arch  [phone]

## 2020-10-04 NOTE — Lactation Note (Signed)
This note was copied from a baby's chart. Lactation Consultation Note  Patient Name: Jennifer Vincent [id number] Date: 10/04/2020 Reason for consult: Follow-up assessment;Nipple pain/trauma Age:31 hours  Mom states she is extremely sore.  Her nipples are bruised.  She states when he latches she feels his upper gum on her nipple and she notices a ridge-like print on her nipple after is comes off.   She desires to breastfeed but does not want to be in so much pain and have a difficult next several weeks due to the emotional, physical, and mental stress of trying to breastfeed.  She has a 2 yr. Old, Jennifer Vincent, at home.  Mom has a battery portable pump and a DEBP.   LC assessed infants suck with gloved finger.  Thick labial frenulum noted with little mobility that does not flange with BF.  Infant's tongue had a chomping motion instead of wave-like movement on the finger, but had a good seal.    Infant was placed STS after hand expression.  Laid back position used.  Teacup hold used and infant latched easily but mom felt such pain infant was removed.Compression ridge noted on nipple. NS #24 applied.  Infant latched and mom felt pain but after a minute she felt more comfortable.  Infant gape was narrow and jaws were tight but he sustained latch easily and had continuous sucking.  LC assisted in flanging top lip. A couple of swallows were heard.  Mom felt pain was increasing. Nipple was blanched and compressed under shield when baby came off the breast.  LC discussed options with mom.  Mom wants to allow nipples time to heal.  DEBP set up for mom.  27 flanges were more comfortable for her.    Plan: Hand express and feed back any EBM to infant. Infant will formula feed by bottle while mom rests her nipples.  She has supplementation guideline amounts. Pump this evening and tomorrow if tolerable.  (Encouraged every 3 hours).   Try to latch infant using shield tomorrow or whenever mom is ready. Follow up with OP LC,  Jennifer Vincent.  Recommended mom consult with her pediatrician about a possible tongue or lip restriction due to pain with latching and nipple damage within the first day of infant's life.      All questions and concerns addressed.  Maternal Data Has patient been taught Hand Expression?: Yes Does the patient have breastfeeding experience prior to this delivery?: Yes How long did the patient breastfeed?: mom exclusively pumped for 5 months  Feeding Mother's Current Feeding Choice: Breast Milk and Formula  LATCH Score Latch: Grasps breast easily, tongue down, lips flanged, rhythmical sucking.  Audible Swallowing: A few with stimulation  Type of Nipple: Flat  Comfort (Breast/Nipple): Engorged, cracked, bleeding, large blisters, severe discomfort (severe discomfort when infant latches, bruising noted on both nipples, some brusing noted on left breast around areola where infant latched to breast area instead of nipple, blistering appearing on right nipple)  Hold (Positioning): Assistance needed to correctly position infant at breast and maintain latch.  LATCH Score: 5   Lactation Tools Discussed/Used Tools: Pump;Comfort gels;Nipple Shields Nipple shield size: 24 Breast pump type: Double-Electric Breast Pump Pump Education: Setup, frequency, and cleaning;Milk Storage Reason for Pumping: latch is extremely painful Pumping frequency: encouraged mom to pump 8x in 24 hours/ every 2-3 hours Pumped volume: 0 mL  Interventions Interventions: Breast feeding basics reviewed;Skin to skin;Breast massage;DEBP;Adjust position;Comfort gels;Hand express;Education  Discharge Discharge Education: Outpatient recommendation;Outpatient Epic message sent Pump:  DEBP;Personal WIC Program: No  Consult Status Consult Status: Follow-up Date: 10/05/20 Follow-up type: In-patient    Maryruth Hancock [facility] 10/04/2020, 5:18 PM

## 2020-10-05 LAB — SURGICAL PATHOLOGY

## 2020-10-05 NOTE — Discharge Summary (Signed)
Postpartum Discharge Summary  Date of Service updated 10/05/20     Patient Name: Jennifer Vincent DOB: Jun 16, 1990 MRN: [id number]  Date of admission: 10/02/2020 Delivery date:10/03/2020  Delivering provider: Everlene Farrier  Date of discharge: 10/05/2020  Admitting diagnosis: Antiphospholipid antibody syndrome ([place]) [D68.61] Intrauterine pregnancy: [redacted]w[redacted]d    Secondary diagnosis:  Active Problems:   Antiphospholipid antibody syndrome ([place]  Additional problems: non-reassuring antenatal testing    Discharge diagnosis: Term Pregnancy Delivered                                              Post partum procedures: none Augmentation: AROM, Pitocin, and Cytotec Complications: None  Hospital course: Induction of Labor With Vaginal Delivery   30y.o. yo [id number] admitted to the hospital 10/02/2020 for induction of labor.  Indication for induction:  non-reassuring fetal heart tones .  She was being monitored for APLAS in the office and BPP was 6/6 at term. Delivery was indicated so she was induced. Patient had an uncomplicated labor course as follows: Membrane Rupture Time/Date: 6:58 PM ,10/03/2020   Delivery Method:Vaginal, Spontaneous  Episiotomy: None  Lacerations:  1st degree  Details of delivery can be found in separate delivery note.  Patient had a routine postpartum course. Patient is discharged home 10/05/20.  Newborn Data: Birth date:10/03/2020  Birth time:8:16 PM  Gender:Female  Living status:Living  Apgars:9 ,9  Weight:3240 g   Magnesium Sulfate received: No BMZ received: No Rhophylac:N/A MMR:N/A T-DaP:Given prenatally Flu: N/A Transfusion:No  Physical exam  Vitals:   10/04/20 0834 10/04/20 1234 10/04/20 2052 10/05/20 0416  BP: 112/76 119/72 115/66 126/84  Pulse: 79 87 79 80  Resp: _0 Temp: 98 F (36.7 C) 98.2 F (36.8 C) 98 F (36.7 C) 98 F (36.7 C)  TempSrc: Oral Oral Oral Oral  SpO2:   100%   Weight:      Height:       General:  alert Lochia: appropriate Uterine Fundus: firm Incision: N/A DVT Evaluation: No evidence of DVT seen on physical exam. Labs: Lab Results  Component Value Date   WBC 14.5 (H) 10/04/2020   HGB 12.0 10/04/2020   HCT 35.2 (L) 10/04/2020   MCV 92.6 10/04/2020   PLT 212 10/04/2020   CMP Latest Ref Rng & Units 10/02/2020  Glucose 70 - 99 mg/dL 106(H)  BUN 6 - 20 mg/dL <5(L)  Creatinine 0.44 - 1.00 mg/dL 0.64  Sodium 135 - 145 mmol/L 135  Potassium 3.5 - 5.1 mmol/L 3.7  Chloride 98 - 111 mmol/L 100  CO2 22 - 32 mmol/L 24  Calcium 8.9 - 10.3 mg/dL 9.4  Total Protein 6.5 - 8.1 g/dL 6.8  Total Bilirubin 0.3 - 1.2 mg/dL 0.2(L)  Alkaline Phos 38 - 126 U/L 113  AST 15 - 41 U/L 25  ALT 0 - 44 U/L 19   Edinburgh Score: Edinburgh Postnatal Depression Scale Screening Tool 10/04/2020  I have been able to laugh and see the funny side of things. 0  I have looked forward with enjoyment to things. 0  I have blamed myself unnecessarily when things went wrong. 0  I have been anxious or worried for no good reason. 1  I have felt scared or panicky for no good reason. 1  Things have been getting on top of me. 0  I  have been so unhappy that I have had difficulty sleeping. 0  I have felt sad or miserable. 0  I have been so unhappy that I have been crying. 0  The thought of harming myself has occurred to me. 0  Edinburgh Postnatal Depression Scale Total 2      After visit meds:  Allergies as of 10/05/2020       Reactions   Lactose Intolerance (gi)    GI upset        Medication List     STOP taking these medications    aspirin EC 81 MG tablet   metoCLOPramide 10 MG tablet Commonly known as: Reglan       TAKE these medications    busPIRone 15 MG tablet Commonly known as: BUSPAR TAKE 2 TABLETS (30 MG TOTAL) BY MOUTH 2 (TWO) TIMES DAILY.   hydroxychloroquine 200 MG tablet Commonly known as: PLAQUENIL Take 1 tablet (200 mg total) by mouth 2 (two) times daily.   prenatal  multivitamin Tabs tablet Take 1 tablet by mouth daily at 12 noon.   sertraline 100 MG tablet Commonly known as: ZOLOFT Take 1 tablet (100 mg total) by mouth daily.   TYLENOL PO Take by mouth at bedtime as needed.         Discharge home in stable condition Infant Feeding: Bottle and Breast Infant Disposition:home with mother Discharge instruction: per After Visit Summary and Postpartum booklet. Activity: Advance as tolerated. Pelvic rest for 6 weeks.  Diet: routine diet Anticipated Birth Control: Unsure Postpartum Appointment:6 weeks Additional Postpartum F/U:  none Future Appointments: Future Appointments  Date Time Provider [place]  11/14/2020  9:00 AM Thayer Headings, PMHNP [facility] None  11/14/2020 10:30 AM Bo Merino, MD [facility] None  11/22/2020  9:30 AM Melvenia Beam, MD [facility] None   Follow up Visit:      10/05/2020 Tyson Dense, MD

## 2020-10-05 NOTE — Lactation Note (Signed)
This note was copied from a baby's chart. Lactation Consultation Note  Patient Name: Jennifer Vincent [id number] Date: 10/05/2020 Reason for consult: Follow-up assessment;Early term 37-38.6wks;Infant weight loss;Other (Comment) (4 % weight loss / 33 hours Bili check 9.1/ per mom pumping and bottle feeding for now due to sore nipples and has the comfort gels, coconut oil and shells. see D/C teaching below.) Age:31 hours LC reviewed the doc flow sheets / and dad updated for LC.  Baby last fed at 0850 - 5 ml EBM and 12 ml for formula.  LC reminded mom and dad by 48 hours old the volume should be at least 30 ml per feeding or greater.  Mom expressed once her nipples are clear and the latch is comfortable without pain she will consider doing both.  Mom has the [facility] brochure with resource numbers.   Maternal Data    Feeding Mother's Current Feeding Choice: Breast Milk and Formula  LATCH Score    Lactation Tools Discussed/Used Tools: Shells;Pump;Flanges;Coconut oil;Comfort gels Flange Size: 27 Breast pump type: Double-Electric Breast Pump Pump Education: Milk Storage  Interventions Interventions: Breast feeding basics reviewed;Shells;Comfort gels;DEBP;Education  Discharge Discharge Education: Engorgement and breast care;Outpatient recommendation;Other (comment) (LC recommended once soreness clears and for full Latch assessment to call / mom has the [facility] O/P numbers) Pump: Personal;DEBP;Manual  Consult Status Consult Status: Complete Date: 10/05/20    [name] 10/05/2020, 10:18 AM

## 2020-10-06 ENCOUNTER — Encounter: Payer: Self-pay | Admitting: Family Medicine

## 2020-10-09 ENCOUNTER — Inpatient Hospital Stay (HOSPITAL_COMMUNITY): Payer: BC Managed Care – PPO

## 2020-10-09 ENCOUNTER — Other Ambulatory Visit: Payer: Self-pay | Admitting: Physician Assistant

## 2020-10-09 ENCOUNTER — Inpatient Hospital Stay (HOSPITAL_COMMUNITY)
Admission: AD | Admit: 2020-10-09 | Payer: BC Managed Care – PPO | Source: Home / Self Care | Admitting: Obstetrics & Gynecology

## 2020-10-09 DIAGNOSIS — M359 Systemic involvement of connective tissue, unspecified: Secondary | ICD-10-CM

## 2020-10-09 NOTE — Telephone Encounter (Signed)
Last Visit: 06/15/2020  Next Visit: 11/14/2020  Labs: 10/02/2020 WBC 11.4, Glucose 106, BUN <5, Albumin 3.4  Eye exam: 05/18/2020 normal   Current Dose per office note 06/15/2020: Plaquenil 200 mg 1 tablet by mouth twice daily.  QK:MMNOTRRNHA disease  Last Fill: 06/15/2020  Okay to refill Plaquenil?

## 2020-10-14 ENCOUNTER — Telehealth (HOSPITAL_COMMUNITY): Payer: Self-pay

## 2020-10-14 NOTE — Telephone Encounter (Signed)
"  I'm doing great." Patient declines questions or concerns about her healing.  "He's amazing. He is a great eater and he is gaining weight. He sleeps in a bassinet in our room on his back." Patient has no questions or concerns about baby.  EPDS score is 3.  Marcelino Duster [facility] 10/14/2020,1225

## 2020-10-18 DIAGNOSIS — L299 Pruritus, unspecified: Secondary | ICD-10-CM | POA: Diagnosis not present

## 2020-10-23 ENCOUNTER — Other Ambulatory Visit: Payer: Self-pay | Admitting: Psychiatry

## 2020-10-23 DIAGNOSIS — F411 Generalized anxiety disorder: Secondary | ICD-10-CM

## 2020-10-27 DIAGNOSIS — Z20822 Contact with and (suspected) exposure to covid-19: Secondary | ICD-10-CM | POA: Diagnosis not present

## 2020-10-27 DIAGNOSIS — B349 Viral infection, unspecified: Secondary | ICD-10-CM | POA: Diagnosis not present

## 2020-10-27 DIAGNOSIS — L22 Diaper dermatitis: Secondary | ICD-10-CM | POA: Diagnosis not present

## 2020-10-27 DIAGNOSIS — R0981 Nasal congestion: Secondary | ICD-10-CM | POA: Diagnosis not present

## 2020-10-31 NOTE — Progress Notes (Deleted)
Office Visit Note  Patient: Jennifer Vincent             Date of Birth: 1990/05/06           MRN: [id number]             PCP: Wynn Banker, MD Referring: Wynn Banker, MD Visit Date: 11/14/2020 Occupation: @GUAROCC @  Subjective:  No chief complaint on file.   History of Present Illness: Jennifer Vincent is a 30 y.o. female ***   Activities of Daily Living:  Patient reports morning stiffness for *** {minute/hour:19697}.   Patient {ACTIONS;DENIES/REPORTS:21021675::"Denies"} nocturnal pain.  Difficulty dressing/grooming: {ACTIONS;DENIES/REPORTS:21021675::"Denies"} Difficulty climbing stairs: {ACTIONS;DENIES/REPORTS:21021675::"Denies"} Difficulty getting out of chair: {ACTIONS;DENIES/REPORTS:21021675::"Denies"} Difficulty using hands for taps, buttons, cutlery, and/or writing: {ACTIONS;DENIES/REPORTS:21021675::"Denies"}  No Rheumatology ROS completed.   PMFS History:  Patient Active Problem List   Diagnosis Date Noted  . Antiphospholipid antibody syndrome (HCC) 10/02/2020  . Chronic migraine without aura, with intractable migraine, so stated, with status migrainosus 06/01/2019  . Pregnant 07/03/2018  . Generalized anxiety disorder 01/11/2018  . Panic disorder 10/04/2016  . Anxiety 10/04/2016    Past Medical History:  Diagnosis Date  . Allergic rhinitis   . Anxiety   . Autoimmune disorder (HCC)    nonspecified  . COVID 04/05/2020  . Frequent headaches   . IBS (irritable bowel syndrome)   . Migraine   . PCOS (polycystic ovarian syndrome)     Family History  Problem Relation Age of Onset  . Depression Mother   . Eating disorder Mother   . Arthritis Father   . Crohn's disease Father   . Ulcerative colitis Father   . Arthritis Brother   . Alcohol abuse Brother   . Mental illness Brother   . Depression Brother   . Anxiety disorder Brother   . Crohn's disease Brother   . Mental illness Maternal Grandfather   . Anxiety disorder Maternal Grandfather   . OCD  Maternal Grandfather   . Heart disease Paternal Grandfather   . Diabetes Paternal Grandfather   . Heart attack Paternal Grandfather   . Breast cancer Maternal Grandmother   . Healthy Daughter    Past Surgical History:  Procedure Laterality Date  . 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 Influenza-Unspecified 12/16/2016, 12/25/2017, 12/16/2018  . PFIZER(Purple Top)SARS-COV-2 Vaccination 05/13/2019, 06/02/2019, 02/16/2020  . PPD Test 10/13/2014     Objective: Vital Signs: LMP 01/11/2020    Physical Exam   Musculoskeletal Exam: ***  CDAI Exam: CDAI Score: -- Patient Global: --; Provider Global: -- Swollen: --; Tender: -- Joint Exam 11/14/2020   No joint exam has been documented for this visit   There is currently no information documented on the homunculus. Go to the Rheumatology activity and complete the homunculus joint exam.  Investigation: No additional findings.  Imaging: No results found.  Recent Labs: Lab Results  Component Value Date   WBC 14.5 (H) 10/04/2020   HGB 12.0 10/04/2020   PLT 212 10/04/2020   NA 135 10/02/2020  K 3.7 10/02/2020   CL 100 10/02/2020   CO2 24 10/02/2020   GLUCOSE 106 (H) 10/02/2020   BUN <5 (L) 10/02/2020   CREATININE 0.64 10/02/2020   BILITOT 0.2 (L) 10/02/2020   ALKPHOS 113 10/02/2020   AST 25 10/02/2020   ALT 19 10/02/2020   PROT 6.8 10/02/2020   ALBUMIN 3.4 (L) 10/02/2020   CALCIUM 9.4 10/02/2020   GFRAA 119 07/29/2019   QFTBGOLDPLUS NEGATIVE 07/29/2019    Speciality Comments: PLQ eye exam: 05/18/2020 normal. [facility]. Follow up in 1  year.  Procedures:  No procedures performed Allergies: Lactose intolerance (gi)   Assessment / Plan:     Visit Diagnoses: No diagnosis found.  Orders: No orders of the defined types were placed in this encounter.  No orders of the defined types were placed in this encounter.   Face-to-face time spent with patient was *** minutes. Greater than 50% of time was spent in counseling and coordination of care.  Follow-Up Instructions: No follow-ups on file.   Ellen Henri, CMA  Note - This record has been created using Animal nutritionist.  Chart creation errors have been sought, but may not always  have been located. Such creation errors do not reflect on  the standard of medical care.

## 2020-11-13 ENCOUNTER — Ambulatory Visit: Payer: BC Managed Care – PPO | Admitting: Neurology

## 2020-11-13 DIAGNOSIS — Z1389 Encounter for screening for other disorder: Secondary | ICD-10-CM | POA: Diagnosis not present

## 2020-11-13 NOTE — Progress Notes (Addendum)
Office Visit Note  Patient: Jennifer Vincent             Date of Birth: 15-Oct-1990           MRN: [id number]             PCP: Wynn Banker, MD Referring: Wynn Banker, MD Visit Date: 11/22/2020 Occupation: @GUAROCC @  Subjective:  Medication management.   History of Present Illness: Jennifer Vincent is a 30 y.o. female with a history of autoimmune disease.  She states that she did well during the antepartum period.  She was induced at 38weeks.  There were no complications during the delivery.  She is 7 weeks postpartum now.  She continues to do well.  She has noticed some redness on her face.  She denies any joint pain or joint swelling.  There is no history of oral ulcers, nasal ulcers, raynauds phenomenon or lymphadenopathy.  She has been taking hydroxychloroquine on a regular basis.  She states she developed some swelling in her left ankle couple of days ago which resolved.  She has been also experiencing some stiffness in her hands.  Activities of Daily Living:  Patient reports morning stiffness for 0 minutes.   Patient Reports nocturnal pain.  Difficulty dressing/grooming: Denies Difficulty climbing stairs: Denies Difficulty getting out of chair: Denies Difficulty using hands for taps, buttons, cutlery, and/or writing: Reports  Review of Systems  Constitutional:  Positive for fatigue.  HENT:  Negative for mouth sores, mouth dryness and nose dryness.   Eyes:  Positive for dryness. Negative for pain and itching.  Respiratory:  Negative for shortness of breath and difficulty breathing.   Cardiovascular:  Negative for chest pain and palpitations.  Gastrointestinal:  Negative for blood in stool, constipation and diarrhea.  Endocrine: Negative for increased urination.  Genitourinary:  Negative for difficulty urinating.  Musculoskeletal:  Positive for joint pain and joint pain. Negative for joint swelling, myalgias, morning stiffness, muscle tenderness and myalgias.  Skin:   Positive for redness. Negative for color change, rash, hair loss and sensitivity to sunlight.  Allergic/Immunologic: Negative for susceptible to infections.  Neurological:  Positive for headaches. Negative for dizziness, numbness, memory loss and weakness.  Hematological:  Negative for bruising/bleeding tendency and swollen glands.  Psychiatric/Behavioral:  Negative for depressed mood, confusion and sleep disturbance. The patient is not nervous/anxious.    PMFS History:  Patient Active Problem List   Diagnosis Date Noted   Antiphospholipid antibody syndrome (HCC) 10/02/2020   Chronic migraine without aura, with intractable migraine, so stated, with status migrainosus 06/01/2019   Pregnant 07/03/2018   Generalized anxiety disorder 01/11/2018   Panic disorder 10/04/2016   Anxiety 10/04/2016    Past Medical History:  Diagnosis Date   Allergic rhinitis    Anxiety    Autoimmune disorder (HCC)    nonspecified   COVID 04/05/2020   Frequent headaches    IBS (irritable bowel syndrome)    Migraine    PCOS (polycystic ovarian syndrome)     Family History  Problem Relation Age of Onset   Depression Mother    Eating disorder Mother    Arthritis Father    Crohn's disease Father    Ulcerative colitis Father    Arthritis Brother    Alcohol abuse Brother    Mental illness Brother    Depression Brother    Anxiety disorder Brother    Crohn's disease Brother    Breast cancer Maternal Grandmother    Mental illness Maternal Grandfather  Anxiety disorder Maternal Grandfather    OCD Maternal Grandfather    Heart disease Paternal Grandfather    Diabetes Paternal Grandfather    Heart attack Paternal Grandfather    Healthy Daughter    Healthy Son    Past Surgical History:  Procedure Laterality Date   TONSILLECTOMY     Social History   Social History Narrative   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      Home Situation: lives with husband and daughter      Spiritual Beliefs:  Jewish      Lifestyle:  regular, aerobic and orange theory; diet healthy       Update: uses peloton 5x a week      Right handed      Caffeine: 1 cup/day   Immunization History  Administered Date(s) Administered   Influenza-Unspecified 12/16/2016, 12/25/2017, 12/16/2018   PFIZER(Purple Top)SARS-COV-2 Vaccination 05/13/2019, 06/02/2019, 02/16/2020   PPD Test 10/13/2014     Objective: Vital Signs: BP 139/87 (BP Location: Left Arm, Patient Position: Sitting, Cuff Size: Normal)   Pulse 81   Ht 5' 8.5" (1.74 m)   Wt 245 lb (111.1 kg)   LMP 01/11/2020   BMI 36.71 kg/m    Physical Exam Vitals and nursing note reviewed.  Constitutional:      Appearance: She is well-developed.  HENT:     Head: Normocephalic and atraumatic.  Eyes:     Conjunctiva/sclera: Conjunctivae normal.  Cardiovascular:     Rate and Rhythm: Normal rate and regular rhythm.     Heart sounds: Normal heart sounds.  Pulmonary:     Effort: Pulmonary effort is normal.     Breath sounds: Normal breath sounds.  Abdominal:     General: Bowel sounds are normal.     Palpations: Abdomen is soft.  Musculoskeletal:     Cervical back: Normal range of motion.  Lymphadenopathy:     Cervical: No cervical adenopathy.  Skin:    General: Skin is warm and dry.     Capillary Refill: Capillary refill takes less than 2 seconds.  Neurological:     Mental Status: She is alert and oriented to person, place, and time.  Psychiatric:        Behavior: Behavior normal.     Musculoskeletal Exam: C-spine was in good range of motion.  Shoulder joints, elbow joints, wrist joints, MCPs PIPs and DIPs with good range of motion with no synovitis.  Hip joints, knee joints, ankles, MTPs and PIPs with good range of motion with no synovitis.  CDAI Exam: CDAI Score: -- Patient Global: --; Provider Global: -- Swollen: --; Tender: -- Joint Exam 11/22/2020   No joint exam has been documented for this visit   There is currently no  information documented on the homunculus. Go to the Rheumatology activity and complete the homunculus joint exam.  Investigation: No additional findings.  Imaging: No results found.  Recent Labs: Lab Results  Component Value Date   WBC 14.5 (H) 10/04/2020   HGB 12.0 10/04/2020   PLT 212 10/04/2020   NA 135 10/02/2020   K 3.7 10/02/2020   CL 100 10/02/2020   CO2 24 10/02/2020   GLUCOSE 106 (H) 10/02/2020   BUN <5 (L) 10/02/2020   CREATININE 0.64 10/02/2020   BILITOT 0.2 (L) 10/02/2020   ALKPHOS 113 10/02/2020   AST 25 10/02/2020   ALT 19 10/02/2020   PROT 6.8 10/02/2020   ALBUMIN 3.4 (L) 10/02/2020   CALCIUM 9.4 10/02/2020   GFRAA  119 07/29/2019   QFTBGOLDPLUS NEGATIVE 07/29/2019    Labs obtained from [facility]: October 20, 2020 WBC 6.9, hemoglobin 15.6, platelets 284, CMP normal AST 22 ALT 26 GFR 102  Speciality Comments: PLQ eye exam: 05/18/2020 normal. [facility]. Follow up in 1 year.  Procedures:  No procedures performed Allergies: Lactose intolerance (gi)   Assessment / Plan:     Visit Diagnoses: Autoimmune disease (HCC) - Positive ANA, positive SCL 70, aCL 42, fatigue, inflammatory arthritis (pictures noted cell phone showed MCP, left ankle joint swelling and rash in the past): Patient is 7 weeks postpartum now.  She states that she has been experiencing some stiffness in her hands and her ankles.  She had swelling in her left ankle joint couple of days ago which resolved.  She brought a picture on her cell phone which shows some swelling in her left ankle joint.  None of the other joints are painful.  She denies any history of oral ulcers, nasal ulcers, malar rash, photosensitivity, Raynaud's phenomenon or lymphadenopathy.  She has been breast-feeding the newborn without difficulty.  High risk medication use - Plaquenil 200 mg 1 tablet by mouth twice daily. PLQ eye exam: 05/18/2020 normal.  I will obtain labs next week per patient's  request.  Anticardiolipin antibody positive-she had low titer anticardiolipin antibodies in the past.  We will check antibody titers again.  Patient will come back next week for lab work when she does not have her newborn with her.  Pain of both elbows-she has off-and-on discomfort in her elbows.  Pain in both hands-she has been experiencing increased discomfort in her hands recently.  No synovitis was noted.  Chondromalacia of both patellae-denies any discomfort today.  Pain of joint of left ankle and foot-she complains of discomfort in her bilateral ankles and some swelling in her left ankle joint.  No synovitis was noted.  She had some tenderness on palpation of her left ankle joint.  She showed a picture on the phone with left ankle joint swelling.  I made her aware that it is not unusual to see autoimmune disease flare in the postpartum period.  Vitamin D deficiency-she had vitamin D deficiency in the past.  We have Checked her vitamin D level since 2020.  We will check vitamin D level with her next labs.  History of migraine  Generalized anxiety disorder  Panic disorder  History of IBS  Family history of ulcerative colitis  Family history of Crohn's disease  Orders: Orders Placed This Encounter  Procedures   Anti-DNA antibody, double-stranded   C3 and C4   Sedimentation rate   Hydroxychloroquine, Blood   Cardiolipin antibodies, IgG, IgM, IgA   VITAMIN D 25 Hydroxy (Vit-D Deficiency, Fractures)    No orders of the defined types were placed in this encounter.    Follow-Up Instructions: Return in about 3 months (around 02/21/2021) for Autoimmune disease.   Pollyann Savoy, MD  Note - This record has been created using Animal nutritionist.  Chart creation errors have been sought, but may not always  have been located. Such creation errors do not reflect on  the standard of medical care.

## 2020-11-14 ENCOUNTER — Ambulatory Visit: Payer: BC Managed Care – PPO | Admitting: Rheumatology

## 2020-11-14 ENCOUNTER — Telehealth (INDEPENDENT_AMBULATORY_CARE_PROVIDER_SITE_OTHER): Payer: BC Managed Care – PPO | Admitting: Psychiatry

## 2020-11-14 ENCOUNTER — Encounter: Payer: Self-pay | Admitting: Psychiatry

## 2020-11-14 DIAGNOSIS — Z79899 Other long term (current) drug therapy: Secondary | ICD-10-CM

## 2020-11-14 DIAGNOSIS — M359 Systemic involvement of connective tissue, unspecified: Secondary | ICD-10-CM

## 2020-11-14 DIAGNOSIS — Z8719 Personal history of other diseases of the digestive system: Secondary | ICD-10-CM

## 2020-11-14 DIAGNOSIS — Z8669 Personal history of other diseases of the nervous system and sense organs: Secondary | ICD-10-CM

## 2020-11-14 DIAGNOSIS — F411 Generalized anxiety disorder: Secondary | ICD-10-CM

## 2020-11-14 DIAGNOSIS — M2242 Chondromalacia patellae, left knee: Secondary | ICD-10-CM

## 2020-11-14 DIAGNOSIS — F410 Panic disorder [episodic paroxysmal anxiety] without agoraphobia: Secondary | ICD-10-CM

## 2020-11-14 DIAGNOSIS — O99891 Other specified diseases and conditions complicating pregnancy: Secondary | ICD-10-CM

## 2020-11-14 DIAGNOSIS — M25522 Pain in left elbow: Secondary | ICD-10-CM

## 2020-11-14 DIAGNOSIS — Z8379 Family history of other diseases of the digestive system: Secondary | ICD-10-CM

## 2020-11-14 MED ORDER — SERTRALINE HCL 100 MG PO TABS: 100.0000 mg | ORAL_TABLET | Freq: Every day | ORAL | 2 refills | Status: DC

## 2020-11-14 MED ORDER — BUSPIRONE HCL 15 MG PO TABS: ORAL_TABLET | ORAL | 1 refills | Status: DC

## 2020-11-14 NOTE — Progress Notes (Signed)
Jennifer Vincent [id number] June 05, 1990 30 y.o.  Virtual Visit via Video Note  I connected with pt @ on 11/14/20 at  9:00 AM EDT by a video enabled telemedicine application and verified that I am speaking with the correct person using two identifiers.   I discussed the limitations of evaluation and management by telemedicine and the availability of in person appointments. The patient expressed understanding and agreed to proceed.  I discussed the assessment and treatment plan with the patient. The patient was provided an opportunity to ask questions and all were answered. The patient agreed with the plan and demonstrated an understanding of the instructions.   The patient was advised to call back or seek an in-person evaluation if the symptoms worsen or if the condition fails to improve as anticipated.  I provided 30 minutes of non-face-to-face time during this encounter.  The patient was located at home.  The provider was located at [facility].   Corie Chiquito, PMHNP   Subjective:   Patient ID:  Jennifer Vincent is a 30 y.o. (DOB 06-Jul-1990) female.  Chief Complaint:  Chief Complaint  Patient presents with   Follow-up    Anxiety    HPI Jennifer Vincent presents for follow-up of anxiety. Had a son 6 weeks ago. She reports that she feels "this postpartum period is easier in some ways.' She reports that she has not experienced the "baby blues" this time. She reports that she has had some irritability which she attributes to decreased sleep. Denies panic or severe anxiety. Some worry about COVID, particularly with daughter's class being sent home due to COVID. Reports that they are finding new rhythm and routines. She reports that she has "little pockets of time where I feel a little blah." She reports that she has not been able to get outside as much as she would like due to the heat. She had to go dairy free since baby was having some reflux. Her sleep has been improving now that baby is  sleeping longer through the night. She is sometimes not able to return to sleep immediately after middle of the night awakenings. Appetite has been ok. Energy is lower due to disrupted sleep. Motivation is good. Denies impaired concentration. Denies SI.  Daughter is now 46 yo. Daughter seems to be adjusting to new brother. Her leave extends through late October. 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   Past Psychiatric Medication Trials: Ativan Xanax Klonopin Sertraline-effective at 100 mg.  Increased anxiety at 150 mg daily BuSpar-effective  Review of Systems:  Review of Systems  Musculoskeletal:  Positive for arthralgias. Negative for gait problem.       Some hand and finger joint pain.   Neurological:  Negative for tremors.  Psychiatric/Behavioral:         Please refer to HPI   Medications: I have reviewed the patient's current medications.  Current Outpatient Medications  Medication Sig Dispense Refill   hydroxychloroquine (PLAQUENIL) 200 MG tablet TAKE 1 TABLET BY MOUTH TWICE A DAY 180 tablet 0   ibuprofen (ADVIL) 200 MG tablet Take 200 mg by mouth every 6 (six) hours as needed.     Prenatal Vit-Fe Fumarate-FA (PRENATAL MULTIVITAMIN) TABS tablet Take 1 tablet by mouth daily at 12 noon.     Acetaminophen (TYLENOL PO) Take by mouth at bedtime as needed. (Patient not taking: Reported on 11/14/2020)     busPIRone (BUSPAR) 15 MG tablet TAKE 2 TABLETS BY MOUTH 2 TIMES DAILY. 180 tablet 1  sertraline (ZOLOFT) 100 MG tablet Take 1 tablet (100 mg total) by mouth daily. 90 tablet 2   No current facility-administered medications for this visit.    Medication Side Effects: None  Allergies:  Allergies  Allergen Reactions   Lactose Intolerance (Gi)     GI upset    Past Medical History:  Diagnosis Date   Allergic rhinitis    Anxiety    Autoimmune disorder (HCC)    nonspecified   COVID 04/05/2020   Frequent headaches    IBS (irritable bowel syndrome)    Migraine     PCOS (polycystic ovarian syndrome)     Family History  Problem Relation Age of Onset   Depression Mother    Eating disorder Mother    Arthritis Father    Crohn's disease Father    Ulcerative colitis Father    Arthritis Brother    Alcohol abuse Brother    Mental illness Brother    Depression Brother    Anxiety disorder Brother    Crohn's disease Brother    Mental illness Maternal Grandfather    Anxiety disorder Maternal Grandfather    OCD Maternal Grandfather    Heart disease Paternal Grandfather    Diabetes Paternal Grandfather    Heart attack Paternal Grandfather    Breast cancer Maternal Grandmother    Healthy Daughter     Social History   Socioeconomic History   Marital status: Married    Spouse name: Not on file   Number of children: 1   Years of education: Not on file   Highest education level: Master's degree (e.g., MA, MS, MEng, MEd, MSW, MBA)  Occupational History   Not on file  Tobacco Use   Smoking status: Never   Smokeless tobacco: Never  Vaping Use   Vaping Use: Never used  Substance and Sexual Activity   Alcohol use: Not Currently   Drug use: Never   Sexual activity: Yes    Birth control/protection: None  Other Topics Concern   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      Home Situation: lives with husband and daughter      Spiritual Beliefs: Jewish      Lifestyle:  regular, aerobic and orange theory; diet healthy       Update: uses peloton 5x a week      Right handed      Caffeine: 1 cup/day   Social Determinants of Corporate investment banker Strain: Not on file  Food Insecurity: Not on file  Transportation Needs: Not on file  Physical Activity: Not on file  Stress: Not on file  Social Connections: Not on file  Intimate Partner Violence: Not on file    Past Medical History, Surgical history, Social history, and Family history were reviewed and updated as appropriate.   Please see  review of systems for further details on the patient's review from today.   Objective:   Physical Exam:  LMP 01/11/2020   Breastfeeding Yes   Physical Exam Neurological:     Mental Status: She is alert and oriented to person, place, and time.     Cranial Nerves: No dysarthria.  Psychiatric:        Attention and Perception: Attention and perception normal.        Mood and Affect: Mood normal.        Speech: Speech normal.        Behavior: Behavior is cooperative.  Thought Content: Thought content normal. Thought content is not paranoid or delusional. Thought content does not include homicidal or suicidal ideation. Thought content does not include homicidal or suicidal plan.        Cognition and Memory: Cognition and memory normal.        Judgment: Judgment normal.     Comments: Insight intact Holding infant at time of exam    Lab Review:     Component Value Date/Time   NA 135 10/02/2020 1553   K 3.7 10/02/2020 1553   CL 100 10/02/2020 1553   CO2 24 10/02/2020 1553   GLUCOSE 106 (H) 10/02/2020 1553   BUN <5 (L) 10/02/2020 1553   CREATININE 0.64 10/02/2020 1553   CREATININE 0.78 07/29/2019 1652   CALCIUM 9.4 10/02/2020 1553   PROT 6.8 10/02/2020 1553   ALBUMIN 3.4 (L) 10/02/2020 1553   AST 25 10/02/2020 1553   ALT 19 10/02/2020 1553   ALKPHOS 113 10/02/2020 1553   BILITOT 0.2 (L) 10/02/2020 1553   GFRNONAA >60 10/02/2020 1553   GFRNONAA 103 07/29/2019 1652   GFRAA 119 07/29/2019 1652       Component Value Date/Time   WBC 14.5 (H) 10/04/2020 0507   RBC 3.80 (L) 10/04/2020 0507   HGB 12.0 10/04/2020 0507   HCT 35.2 (L) 10/04/2020 0507   PLT 212 10/04/2020 0507   MCV 92.6 10/04/2020 0507   MCH 31.6 10/04/2020 0507   MCHC 34.1 10/04/2020 0507   RDW 13.3 10/04/2020 0507   LYMPHSABS 1,994 07/29/2019 1652   MONOABS 0.3 01/18/2019 1226   EOSABS 48 07/29/2019 1652   BASOSABS 83 07/29/2019 1652    No results found for: POCLITH, LITHIUM   No results found  for: PHENYTOIN, PHENOBARB, VALPROATE, CBMZ   .res Assessment: Plan:   Pt seen for 30 minutes and time spent discussing mood and anxiety s/s during pregnancy and post-partum. Agreed that mood and anxiety s/s are stable overall. Recommend continuing current medications without changes. Advised pt to contact office if she experiences any postpartum depression s/s and to request soonest available apt.  Will continue Buspar for anxiety.  Continue Sertraline 100 mg po qd for anxiety.  Pt to follow-up in 6 months or sooner if clinically indicated.  Patient advised to contact office with any questions, adverse effects, or acute worsening in signs and symptoms.  Jennifer Vincent was seen today for follow-up.  Diagnoses and all orders for this visit:  Generalized anxiety disorder -     sertraline (ZOLOFT) 100 MG tablet; Take 1 tablet (100 mg total) by mouth daily. -     busPIRone (BUSPAR) 15 MG tablet; TAKE 2 TABLETS BY MOUTH 2 TIMES DAILY.    Please see After Visit Summary for patient specific instructions.  Future Appointments  Date Time Provider Department Center  11/16/2020  2:00 PM Anson Fret, MD [facility] None  11/22/2020  3:30 PM Pollyann Savoy, MD [facility] None  05/15/2021  3:30 PM Corie Chiquito, PMHNP [facility] None    No orders of the defined types were placed in this encounter.     -------------------------------

## 2020-11-16 ENCOUNTER — Encounter: Payer: Self-pay | Admitting: Neurology

## 2020-11-16 ENCOUNTER — Ambulatory Visit (INDEPENDENT_AMBULATORY_CARE_PROVIDER_SITE_OTHER): Payer: BC Managed Care – PPO | Admitting: Neurology

## 2020-11-16 ENCOUNTER — Other Ambulatory Visit: Payer: Self-pay

## 2020-11-16 DIAGNOSIS — G43711 Chronic migraine without aura, intractable, with status migrainosus: Secondary | ICD-10-CM

## 2020-11-16 NOTE — Progress Notes (Signed)
Botox- 200 units x 1 vial Lot: [id number] Expiration: 04/2023 NDC: 6010-9323-55  Bacteriostatic 0.9% Sodium Chloride- 31mL total Lot: [id number] Expiration: 03-18-2022 NDC: 5427-0623-76  Dx: G43.711 B/B

## 2020-11-16 NOTE — Progress Notes (Signed)
Consent Form Botulism Toxin Injection For Chronic Migraine    11/16/2020: Patient getting great relief, > 70% improvement in migraine and headache frequency. She was on Ajovy in the past and lovd it she is breastfeeding now I printed out info from [facility] [employer] for patient on botox and ajovy.    Medications tried: topamax, maxalt, zoloft, imitrex, zonegran, propranolol, zofran,  Amitriptyline,flexeril, ajovy, zonegran  Reviewed orally with patient, additionally signature is on file:  Botulism toxin has been approved by the Federal drug administration for treatment of chronic migraine. Botulism toxin does not cure chronic migraine and it may not be effective in some patients.  The administration of botulism toxin is accomplished by injecting a small amount of toxin into the muscles of the neck and head. Dosage must be titrated for each individual. Any benefits resulting from botulism toxin tend to wear off after 3 months with a repeat injection required if benefit is to be maintained. Injections are usually done every 3-4 months with maximum effect peak achieved by about 2 or 3 weeks. Botulism toxin is expensive and you should be sure of what costs you will incur resulting from the injection.  The side effects of botulism toxin use for chronic migraine may include:   -Transient, and usually mild, facial weakness with facial injections  -Transient, and usually mild, head or neck weakness with head/neck injections  -Reduction or loss of forehead facial animation due to forehead muscle weakness  -Eyelid drooping  -Dry eye  -Pain at the site of injection or bruising at the site of injection  -Double vision  -Potential unknown long term risks  Contraindications: You should not have Botox if you are pregnant, nursing, allergic to albumin, have an infection, skin condition, or muscle weakness at the site of the injection, or have myasthenia gravis, Lambert-Eaton syndrome, or ALS.  It is also possible  that as with any injection, there may be an allergic reaction or no effect from the medication. Reduced effectiveness after repeated injections is sometimes seen and rarely infection at the injection site may occur. All care will be taken to prevent these side effects. If therapy is given over a long time, atrophy and wasting in the muscle injected may occur. Occasionally the patient's become refractory to treatment because they develop antibodies to the toxin. In this event, therapy needs to be modified.  I have read the above information and consent to the administration of botulism toxin.    BOTOX PROCEDURE NOTE FOR MIGRAINE HEADACHE    Contraindications and precautions discussed with patient(above). Aseptic procedure was observed and patient tolerated procedure. Procedure performed by Dr. Artemio Aly  The condition has existed for more than 6 months, and pt does not have a diagnosis of ALS, Myasthenia Gravis or Lambert-Eaton Syndrome.  Risks and benefits of injections discussed and pt agrees to proceed with the procedure.  Written consent obtained  These injections are medically necessary. Pt  receives good benefits from these injections. These injections do not cause sedations or hallucinations which the oral therapies may cause.  Description of procedure:  The patient was placed in a sitting position. The standard protocol was used for Botox as follows, with 5 units of Botox injected at each site:   -Procerus muscle, midline injection  -Corrugator muscle, bilateral injection  -Frontalis muscle, bilateral injection, with 2 sites each side, medial injection was performed in the upper one third of the frontalis muscle, in the region vertical from the medial inferior edge of the superior orbital  rim. The lateral injection was again in the upper one third of the forehead vertically above the lateral limbus of the cornea, 1.5 cm lateral to the medial injection site.  -Temporalis muscle  injection, 4 sites, bilaterally. The first injection was 3 cm above the tragus of the ear, second injection site was 1.5 cm to 3 cm up from the first injection site in line with the tragus of the ear. The third injection site was 1.5-3 cm forward between the first 2 injection sites. The fourth injection site was 1.5 cm posterior to the second injection site.   -Occipitalis muscle injection, 3 sites, bilaterally. The first injection was done one half way between the occipital protuberance and the tip of the mastoid process behind the ear. The second injection site was done lateral and superior to the first, 1 fingerbreadth from the first injection. The third injection site was 1 fingerbreadth superiorly and medially from the first injection site.  -Cervical paraspinal muscle injection, 2 sites, bilateral knee first injection site was 1 cm from the midline of the cervical spine, 3 cm inferior to the lower border of the occipital protuberance. The second injection site was 1.5 cm superiorly and laterally to the first injection site.  -Trapezius muscle injection was performed at 3 sites, bilaterally. The first injection site was in the upper trapezius muscle halfway between the inflection point of the neck, and the acromion. The second injection site was one half way between the acromion and the first injection site. The third injection was done between the first injection site and the inflection point of the neck.   Will return for repeat injection in 3 months.   155 units of Botox was used, 45U  Botox not injected was wasted. The patient tolerated the procedure well, there were no complications of the above procedure.

## 2020-11-22 ENCOUNTER — Other Ambulatory Visit: Payer: Self-pay

## 2020-11-22 ENCOUNTER — Ambulatory Visit (INDEPENDENT_AMBULATORY_CARE_PROVIDER_SITE_OTHER): Payer: BC Managed Care – PPO | Admitting: Rheumatology

## 2020-11-22 ENCOUNTER — Encounter: Payer: Self-pay | Admitting: Rheumatology

## 2020-11-22 ENCOUNTER — Ambulatory Visit: Payer: Self-pay | Admitting: Neurology

## 2020-11-22 VITALS — BP 139/87 | HR 81 | Ht 68.5 in | Wt 245.0 lb

## 2020-11-22 DIAGNOSIS — M79642 Pain in left hand: Secondary | ICD-10-CM

## 2020-11-22 DIAGNOSIS — Z8379 Family history of other diseases of the digestive system: Secondary | ICD-10-CM

## 2020-11-22 DIAGNOSIS — F410 Panic disorder [episodic paroxysmal anxiety] without agoraphobia: Secondary | ICD-10-CM

## 2020-11-22 DIAGNOSIS — M79641 Pain in right hand: Secondary | ICD-10-CM

## 2020-11-22 DIAGNOSIS — M2241 Chondromalacia patellae, right knee: Secondary | ICD-10-CM

## 2020-11-22 DIAGNOSIS — Z8719 Personal history of other diseases of the digestive system: Secondary | ICD-10-CM

## 2020-11-22 DIAGNOSIS — M25521 Pain in right elbow: Secondary | ICD-10-CM | POA: Diagnosis not present

## 2020-11-22 DIAGNOSIS — M25522 Pain in left elbow: Secondary | ICD-10-CM

## 2020-11-22 DIAGNOSIS — E559 Vitamin D deficiency, unspecified: Secondary | ICD-10-CM

## 2020-11-22 DIAGNOSIS — M25572 Pain in left ankle and joints of left foot: Secondary | ICD-10-CM

## 2020-11-22 DIAGNOSIS — M359 Systemic involvement of connective tissue, unspecified: Secondary | ICD-10-CM | POA: Diagnosis not present

## 2020-11-22 DIAGNOSIS — R760 Raised antibody titer: Secondary | ICD-10-CM | POA: Diagnosis not present

## 2020-11-22 DIAGNOSIS — Z79899 Other long term (current) drug therapy: Secondary | ICD-10-CM | POA: Diagnosis not present

## 2020-11-22 DIAGNOSIS — Z8669 Personal history of other diseases of the nervous system and sense organs: Secondary | ICD-10-CM

## 2020-11-22 DIAGNOSIS — F411 Generalized anxiety disorder: Secondary | ICD-10-CM

## 2020-11-22 DIAGNOSIS — O99891 Other specified diseases and conditions complicating pregnancy: Secondary | ICD-10-CM

## 2020-11-22 DIAGNOSIS — M2242 Chondromalacia patellae, left knee: Secondary | ICD-10-CM

## 2020-11-28 ENCOUNTER — Other Ambulatory Visit: Payer: Self-pay | Admitting: *Deleted

## 2020-11-28 DIAGNOSIS — E559 Vitamin D deficiency, unspecified: Secondary | ICD-10-CM

## 2020-11-28 DIAGNOSIS — Z79899 Other long term (current) drug therapy: Secondary | ICD-10-CM | POA: Diagnosis not present

## 2020-11-28 DIAGNOSIS — M359 Systemic involvement of connective tissue, unspecified: Secondary | ICD-10-CM | POA: Diagnosis not present

## 2020-11-29 NOTE — Progress Notes (Signed)
Vitamin D is low.  Please advise vitamin D 2000 units daily.  Sed rate is normal and complements are normal.  Other labs are pending.

## 2020-12-06 LAB — CARDIOLIPIN ANTIBODIES, IGG, IGM, IGA
Anticardiolipin IgA: <2 APL-U/mL
Anticardiolipin IgG: <2 GPL-U/mL
Anticardiolipin IgM: <2 MPL-U/mL

## 2020-12-06 LAB — C3 AND C4
C3 Complement: 130 mg/dL (ref 83–193)
C4 Complement: 36 mg/dL (ref 15–57)

## 2020-12-06 LAB — ANTI-DNA ANTIBODY, DOUBLE-STRANDED: ds DNA Ab: 1 IU/mL

## 2020-12-06 LAB — VITAMIN D 25 HYDROXY (VIT D DEFICIENCY, FRACTURES): Vit D, 25-Hydroxy: 29 ng/mL — ABNORMAL LOW (ref 30–100)

## 2020-12-06 LAB — HYDROXYCHLOROQUINE,BLOOD: HYDROXYCHLOROQUINE, (B): 690 ng/mL — ABNORMAL HIGH

## 2020-12-06 LAB — SEDIMENTATION RATE: Sed Rate: 2 mm/h (ref 0–20)

## 2020-12-06 NOTE — Progress Notes (Signed)
All the autoimmune labs are negative.  Hydroxychloroquine level is low normal.  Please advise patient to take hydroxychloroquine on a regular basis.  We will continue current treatment.

## 2020-12-07 NOTE — Progress Notes (Signed)
No change in treatment advised.

## 2020-12-19 ENCOUNTER — Telehealth: Payer: Self-pay | Admitting: Adult Health

## 2020-12-19 NOTE — Telephone Encounter (Signed)
Pt called says she has been experiencing a severe headache for the past 4 days now. Pt requesting a call back, also sent a Mychart Message.

## 2020-12-19 NOTE — Telephone Encounter (Signed)
I called, she stated she had triptans with bad rebound, reglan helped some, needs ubrelvy prescription again if ok, restart ajovy?  She asked about nerve block (I relayed that there is back order on marcaine at this time).

## 2020-12-19 NOTE — Addendum Note (Signed)
Addended by: Guy Begin on: 12/19/2020 05:09 PM   Modules accepted: Orders

## 2020-12-19 NOTE — Telephone Encounter (Signed)
I called pt and she has taken the [name] Fri, Sun and today (takes edge off from level 10 to 5-6). That is taking 100mg  ubrelvy only, she has not taken additional tablet.  She is breastfeeding 70.69 month old (dumping her breastmilk and waiting 6 hours to feed).  Any other recommendations>  may mychart if easier.

## 2020-12-20 ENCOUNTER — Telehealth: Payer: Self-pay | Admitting: *Deleted

## 2020-12-20 MED ORDER — UBRELVY 100 MG PO TABS: ORAL_TABLET | ORAL | 5 refills | Status: DC

## 2020-12-20 NOTE — Telephone Encounter (Signed)
   Arnola Erb Key: [id number] - Rx #: [id number]  Approved today Effective from 12/20/2020 through 03/13/2021.  Will contact patient through mychart to make her aware of approval

## 2020-12-20 NOTE — Telephone Encounter (Signed)
I have  sent message thru another email.

## 2020-12-20 NOTE — Addendum Note (Signed)
Addended by: Guy Begin on: 12/20/2020 08:04 AM   Modules accepted: Orders

## 2020-12-25 MED ORDER — AJOVY 225 MG/1.5ML ~~LOC~~ SOAJ: 225.0000 mg | SUBCUTANEOUS | 5 refills | Status: DC

## 2020-12-25 NOTE — Addendum Note (Signed)
Addended by: Bertram Savin on: 12/25/2020 12:00 PM   Modules accepted: Orders

## 2020-12-29 ENCOUNTER — Other Ambulatory Visit: Payer: Self-pay | Admitting: Neurology

## 2020-12-29 DIAGNOSIS — G43009 Migraine without aura, not intractable, without status migrainosus: Secondary | ICD-10-CM

## 2020-12-29 MED ORDER — NURTEC 75 MG PO TBDP: 75.0000 mg | ORAL_TABLET | Freq: Every day | ORAL | 6 refills | Status: DC | PRN

## 2020-12-29 MED ORDER — METHYLPREDNISOLONE 4 MG PO TBPK: ORAL_TABLET | ORAL | 1 refills | Status: DC

## 2021-01-01 ENCOUNTER — Telehealth: Payer: Self-pay | Admitting: *Deleted

## 2021-01-01 NOTE — Telephone Encounter (Signed)
Jennifer Vincent (Key: [id number]) Rx #: [id number] Nurtec 75MG  dispersible tablets  PA for Nurtec sent today Waiting on approval from insurance. Could take 72 hours for approval response

## 2021-01-03 NOTE — Telephone Encounter (Signed)
Effective from 01/01/2021 through 03/25/2021. PA for Nurtec has been approved will contact patient through mychart to make her aware

## 2021-01-09 ENCOUNTER — Other Ambulatory Visit: Payer: Self-pay | Admitting: Physician Assistant

## 2021-01-09 DIAGNOSIS — M359 Systemic involvement of connective tissue, unspecified: Secondary | ICD-10-CM

## 2021-01-09 NOTE — Telephone Encounter (Signed)
Next Visit: 02/21/2021  Last Visit: 11/22/2020  Labs: October 20, 2020 WBC 6.9, hemoglobin 15.6, platelets 284, CMP normal AST 22 ALT 26 GFR 102  Eye exam: 05/18/2020 normal.    Current Dose per office note 11/22/2020: Plaquenil 200 mg 1 tablet by mouth twice daily  TK:[id number] disease   Last Fill: 10/09/2020  Okay to refill Plaquenil?

## 2021-01-10 ENCOUNTER — Ambulatory Visit (INDEPENDENT_AMBULATORY_CARE_PROVIDER_SITE_OTHER): Payer: BC Managed Care – PPO | Admitting: Neurology

## 2021-01-10 DIAGNOSIS — G43711 Chronic migraine without aura, intractable, with status migrainosus: Secondary | ICD-10-CM

## 2021-01-10 MED ORDER — NARATRIPTAN HCL 2.5 MG PO TABS: 2.5000 mg | ORAL_TABLET | ORAL | 11 refills | Status: DC | PRN

## 2021-01-10 MED ORDER — ELYXYB 120 MG/4.8ML PO SOLN: 120.0000 mg | Freq: Every day | ORAL | 6 refills | Status: DC | PRN

## 2021-01-10 NOTE — Progress Notes (Signed)
Botox- 200 units x 1 vial Lot: [id number] Expiration: 05/2023 NDC: 2924-4628-63  Bacteriostatic 0.9% Sodium Chloride- 49mL total Lot: [id number] Expiration: 03/18/2022 NDC: 6579-0383-33  Dx: .O32.919 Sample

## 2021-01-10 NOTE — Progress Notes (Signed)
Consent Form Botulism Toxin Injection For Chronic Migraine  Elyxyb A triptan Schedule back in 8 weeks   01/10/2021: Patient getting great relief, > 70% improvement in migraine and headache frequency. No longer breastfeeding. Sent in nurtec, ubrelvy stopped working. Also will prescribe a long-acting triptan to avoid reboumd and try elyxyb, try taking nurtec every day of period or ask obgyn if can go on continuous  Meds ordered this encounter  Medications   naratriptan (AMERGE) 2.5 MG tablet    Sig: Take 1 tablet (2.5 mg total) by mouth as needed for migraine. Take one (1) tablet at onset of headache; if returns or does not resolve, may repeat after 2 hours; do not exceed five (5) mg in 24 hours.    Dispense:  9 tablet    Refill:  11   ELYXYB 120 MG/4.8ML SOLN    Sig: Take 120 mg by mouth daily as needed.    Dispense:  28.8 mL    Refill:  6      Medications tried: topamax, maxalt, zoloft, imitrex, zonegran, propranolol, zofran,  Amitriptyline,flexeril, ajovy, zonegran  Reviewed orally with patient, additionally signature is on file:  Botulism toxin has been approved by the Federal drug administration for treatment of chronic migraine. Botulism toxin does not cure chronic migraine and it may not be effective in some patients.  The administration of botulism toxin is accomplished by injecting a small amount of toxin into the muscles of the neck and head. Dosage must be titrated for each individual. Any benefits resulting from botulism toxin tend to wear off after 3 months with a repeat injection required if benefit is to be maintained. Injections are usually done every 3-4 months with maximum effect peak achieved by about 2 or 3 weeks. Botulism toxin is expensive and you should be sure of what costs you will incur resulting from the injection.  The side effects of botulism toxin use for chronic migraine may include:   -Transient, and usually mild, facial weakness with facial  injections  -Transient, and usually mild, head or neck weakness with head/neck injections  -Reduction or loss of forehead facial animation due to forehead muscle weakness  -Eyelid drooping  -Dry eye  -Pain at the site of injection or bruising at the site of injection  -Double vision  -Potential unknown long term risks  Contraindications: You should not have Botox if you are pregnant, nursing, allergic to albumin, have an infection, skin condition, or muscle weakness at the site of the injection, or have myasthenia gravis, Lambert-Eaton syndrome, or ALS.  It is also possible that as with any injection, there may be an allergic reaction or no effect from the medication. Reduced effectiveness after repeated injections is sometimes seen and rarely infection at the injection site may occur. All care will be taken to prevent these side effects. If therapy is given over a long time, atrophy and wasting in the muscle injected may occur. Occasionally the patient's become refractory to treatment because they develop antibodies to the toxin. In this event, therapy needs to be modified.  I have read the above information and consent to the administration of botulism toxin.    BOTOX PROCEDURE NOTE FOR MIGRAINE HEADACHE    Contraindications and precautions discussed with patient(above). Aseptic procedure was observed and patient tolerated procedure. Procedure performed by Dr. Artemio Aly  The condition has existed for more than 6 months, and pt does not have a diagnosis of ALS, Myasthenia Gravis or Lambert-Eaton Syndrome.  Risks and benefits  of injections discussed and pt agrees to proceed with the procedure.  Written consent obtained  These injections are medically necessary. Pt  receives good benefits from these injections. These injections do not cause sedations or hallucinations which the oral therapies may cause.  Description of procedure:  The patient was placed in a sitting position. The  standard protocol was used for Botox as follows, with 5 units of Botox injected at each site:   -Procerus muscle, midline injection  -Corrugator muscle, bilateral injection  -Frontalis muscle, bilateral injection, with 2 sites each side, medial injection was performed in the upper one third of the frontalis muscle, in the region vertical from the medial inferior edge of the superior orbital rim. The lateral injection was again in the upper one third of the forehead vertically above the lateral limbus of the cornea, 1.5 cm lateral to the medial injection site.  -Temporalis muscle injection, 4 sites, bilaterally. The first injection was 3 cm above the tragus of the ear, second injection site was 1.5 cm to 3 cm up from the first injection site in line with the tragus of the ear. The third injection site was 1.5-3 cm forward between the first 2 injection sites. The fourth injection site was 1.5 cm posterior to the second injection site.   -Occipitalis muscle injection, 3 sites, bilaterally. The first injection was done one half way between the occipital protuberance and the tip of the mastoid process behind the ear. The second injection site was done lateral and superior to the first, 1 fingerbreadth from the first injection. The third injection site was 1 fingerbreadth superiorly and medially from the first injection site.  -Cervical paraspinal muscle injection, 2 sites, bilateral knee first injection site was 1 cm from the midline of the cervical spine, 3 cm inferior to the lower border of the occipital protuberance. The second injection site was 1.5 cm superiorly and laterally to the first injection site.  -Trapezius muscle injection was performed at 3 sites, bilaterally. The first injection site was in the upper trapezius muscle halfway between the inflection point of the neck, and the acromion. The second injection site was one half way between the acromion and the first injection site. The third  injection was done between the first injection site and the inflection point of the neck.   Will return for repeat injection in 3 months.   155 units of Botox was used, 45U  Botox not injected was wasted. The patient tolerated the procedure well, there were no complications of the above procedure.   I spent 30 minutes of face-to-face and non-face-to-face time with patient on the  1. Chronic migraine without aura, with intractable migraine, so stated, with status migrainosus    diagnosis.  This included previsit chart review, lab review, study review, order entry, electronic health record documentation, patient education on the different diagnostic and therapeutic options, counseling and coordination of care, risks and benefits of management, compliance, or risk factor reduction

## 2021-01-11 ENCOUNTER — Telehealth: Payer: Self-pay | Admitting: *Deleted

## 2021-01-11 NOTE — Telephone Encounter (Signed)
Cason Lemaster Key: [id number] - Rx #: [id number]  Sent PA for Elyxyb . Waiting on approval

## 2021-01-24 ENCOUNTER — Telehealth: Payer: Self-pay | Admitting: Neurology

## 2021-01-24 NOTE — Telephone Encounter (Signed)
Pt called stating that her husband injected her Fremanezumab-vfrm (AJOVY) 225 MG/1.5ML SOAJ on Monday on her R Thigh and this morning she is having Redness, Itchiness and Heat at the Injection Site and she would like to discuss with RN. Please advise.

## 2021-01-24 NOTE — Telephone Encounter (Signed)
I called pt.  She has had injection previously no problem.  Her husband gives to her in leg.  On Monday she had flu shot as well as the ajovy injection.  Noted today local reaction, redness, some swelling, itching.  She cannot take benadryl due to working.  I relayed use cool compress or warm (try one then the other to see which one may help). Topical cortisone.  Not sure why have reaction now.  If you would have her do anything more or change medication??  No breathing problems or hives.

## 2021-01-25 NOTE — Telephone Encounter (Signed)
I called patient.  I wanted to make sure she received the MyChart email.  I left message on her mobile.  She is to call back if she needs anything.  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

## 2021-01-27 ENCOUNTER — Other Ambulatory Visit: Payer: Self-pay | Admitting: Psychiatry

## 2021-01-27 DIAGNOSIS — F411 Generalized anxiety disorder: Secondary | ICD-10-CM

## 2021-02-05 DIAGNOSIS — Z20822 Contact with and (suspected) exposure to covid-19: Secondary | ICD-10-CM | POA: Diagnosis not present

## 2021-02-05 DIAGNOSIS — R509 Fever, unspecified: Secondary | ICD-10-CM | POA: Diagnosis not present

## 2021-02-12 NOTE — Progress Notes (Signed)
Office Visit Note  Patient: Jennifer Vincent             Date of Birth: 07/31/1990           MRN: [id number]             PCP: Caren Macadam, MD Referring: Caren Macadam, MD Visit Date: 02/21/2021 Occupation: @GUAROCC @  Subjective:  Increased joint pain  History of Present Illness: Jennifer Vincent is a 30 y.o. female with history of autoimmune disease.  She is taking plaquenil 200 mg 1 tablet by mouth twice daily.  She is tolerating Plaquenil without any side effects and has not missed any doses recently.  She states that over the past couple of months she has been experiencing increased pain in multiple joints including both elbows, both wrists, both hands.  Her pain has been most severe at night and she has been having to take Advil at bedtime to help her sleep.  She describes the pain as a throbbing sensation.  It is difficult for her to tell if she is having any joint swelling.  She continues to have intermittent discomfort in her knee joints. She reports that she had the flu about 3 weeks ago.  She states that her symptoms have been gradually improving.  She continues to have some residual fatigue.  She states that she feels as though her autoimmune disease has been flaring since after having the flu.  She has had a Maller rash more frequently.  She continues to have chronic dry eyes.  She denies any oral or nasal ulcerations.  She denies any shortness of breath, pleuritic chest pain, or palpitations.  She has not had any symptoms of Raynaud's.  She denies any swollen lymph nodes.  She states she has noticed increased hair loss which she attributes to being in the postpartum period.  She is no longer breast-feeding.  r.  Activities of Daily Living:  Patient reports morning stiffness for 0 minutes.   Patient Reports nocturnal pain.  Difficulty dressing/grooming: Denies Difficulty climbing stairs: Denies Difficulty getting out of chair: Denies Difficulty using hands for taps, buttons,  cutlery, and/or writing: Reports  Review of Systems  Constitutional:  Negative for fatigue.  HENT:  Negative for mouth sores, mouth dryness and nose dryness.   Eyes:  Positive for dryness. Negative for pain and itching.  Respiratory:  Negative for shortness of breath and difficulty breathing.   Cardiovascular:  Negative for chest pain and palpitations.  Gastrointestinal:  Negative for blood in stool, constipation and diarrhea.  Endocrine: Negative for increased urination.  Genitourinary:  Negative for difficulty urinating.  Musculoskeletal:  Positive for joint pain, joint pain and joint swelling. Negative for myalgias, morning stiffness, muscle tenderness and myalgias.  Skin:  Positive for rash and redness. Negative for color change.  Allergic/Immunologic: Negative for susceptible to infections.  Neurological:  Positive for headaches. Negative for dizziness, numbness, memory loss and weakness.  Hematological:  Positive for bruising/bleeding tendency.  Psychiatric/Behavioral:  Positive for sleep disturbance. Negative for confusion. The patient is nervous/anxious.    PMFS History:  Patient Active Problem List   Diagnosis Date Noted   Antiphospholipid antibody syndrome ([place]) 10/02/2020   Chronic migraine without aura, with intractable migraine, so stated, with status migrainosus 06/01/2019   Pregnant 07/03/2018   Generalized anxiety disorder 01/11/2018   Panic disorder 10/04/2016   Anxiety 10/04/2016    Past Medical History:  Diagnosis Date   Allergic rhinitis    Anxiety  Autoimmune disorder (HCC)    nonspecified   COVID 04/05/2020   Frequent headaches    IBS (irritable bowel syndrome)    Migraine    PCOS (polycystic ovarian syndrome)     Family History  Problem Relation Age of Onset   Depression Mother    Eating disorder Mother    Arthritis Father    Crohn's disease Father    Ulcerative colitis Father    Arthritis Brother    Alcohol abuse Brother    Mental illness  Brother    Depression Brother    Anxiety disorder Brother    Crohn's disease Brother    Breast cancer Maternal Grandmother    Mental illness Maternal Grandfather    Anxiety disorder Maternal Grandfather    OCD Maternal Grandfather    Heart disease Paternal Grandfather    Diabetes Paternal Grandfather    Heart attack Paternal Grandfather    Healthy Daughter    Healthy Son    Past Surgical History:  Procedure Laterality Date   TONSILLECTOMY     Social History   Social History Narrative   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      Home Situation: lives with husband and daughter      Spiritual Beliefs: Jewish      Lifestyle:  regular, aerobic and orange theory; diet healthy       Update: uses peloton 5x a week      Right handed      Caffeine: 1 cup/day   Immunization History  Administered Date(s) Administered   Influenza-Unspecified 12/16/2016, 12/25/2017, 12/16/2018   PFIZER(Purple Top)SARS-COV-2 Vaccination 05/13/2019, 06/02/2019, 02/16/2020   PPD Test 10/13/2014     Objective: Vital Signs: BP 120/74 (BP Location: Left Arm, Patient Position: Sitting, Cuff Size: Large)   Pulse (!) 115   Ht 5' 8.5" (1.74 m)   Wt 235 lb (106.6 kg)   Breastfeeding No   BMI 35.21 kg/m    Physical Exam Vitals and nursing note reviewed.  Constitutional:      Appearance: She is well-developed.  HENT:     Head: Normocephalic and atraumatic.  Eyes:     Conjunctiva/sclera: Conjunctivae normal.  Pulmonary:     Effort: Pulmonary effort is normal.  Abdominal:     Palpations: Abdomen is soft.  Musculoskeletal:     Cervical back: Normal range of motion.  Skin:    General: Skin is warm and dry.     Capillary Refill: Capillary refill takes less than 2 seconds.  Neurological:     Mental Status: She is alert and oriented to person, place, and time.  Psychiatric:        Behavior: Behavior normal.     Musculoskeletal Exam: C-spine, thoracic spine, and lumbar spine have good ROM.   Shoulder joints have good ROM with no discomfort.  Some tenderness over the right lateral epicondyle.  Tenderness along the left elbow joint line.  Tenderness over both wrist joints with some fullness in the right wrist.  Tenderness over the right 2nd and 3rd PIP joints and left 4th MCP and PIP joint. Hip joints have good range of motion with no groin pain.  Knee joints have good range of motion with no warmth or effusion.  Ankle joints have good range of motion with no tenderness or joint swelling.  CDAI Exam: CDAI Score: -- Patient Global: --; Provider Global: -- Swollen: 0 ; Tender: 8  Joint Exam 02/21/2021      Right  Left  Elbow  Tender  Wrist   Tender   Tender  MCP 4      Tender  IP      Tender  PIP 2   Tender     PIP 3   Tender     PIP 4      Tender     Investigation: No additional findings.  Imaging: No results found.  Recent Labs: Lab Results  Component Value Date   WBC 14.5 (H) 10/04/2020   HGB 12.0 10/04/2020   PLT 212 10/04/2020   NA 135 10/02/2020   K 3.7 10/02/2020   CL 100 10/02/2020   CO2 24 10/02/2020   GLUCOSE 106 (H) 10/02/2020   BUN <5 (L) 10/02/2020   CREATININE 0.64 10/02/2020   BILITOT 0.2 (L) 10/02/2020   ALKPHOS 113 10/02/2020   AST 25 10/02/2020   ALT 19 10/02/2020   PROT 6.8 10/02/2020   ALBUMIN 3.4 (L) 10/02/2020   CALCIUM 9.4 10/02/2020   GFRAA 119 07/29/2019   QFTBGOLDPLUS NEGATIVE 07/29/2019    Speciality Comments: PLQ eye exam: 05/18/2020 normal. [facility]. Follow up in 1 year.  Procedures:  No procedures performed Allergies: Lactose intolerance (gi)      Assessment / Plan:     Visit Diagnoses: Autoimmune disease ([place]) - Positive ANA, positive SCL 70, aCL 42, fatigue, inflammatory arthritis (pictures noted cell phone showed MCP, left ankle joint swelling and rash in the past): She has been experiencing increased joint pain and stiffness involving multiple joints especially both elbows, both wrist joints, and both hands.   She has tenderness along both elbow joint lines, both wrist joints, and over the right second and third PIPs and left fourth MCP and PIP joints.  Some fullness in the right wrist was noted upon palpation.  She has been taking Plaquenil 200 mg 1 tablet by mouth twice daily.  She is tolerating Plaquenil without any side effects and has not missed any doses recently.  She has been having to take Advil on a nightly basis for symptomatic relief.  She continues to experience dry eyes, intermittent Malar rashes, and fatigue. Lab work from 11/28/2020 was reviewed today in the office: Hydroxychloroquine blood level 690.  No change in therapy recommended at that time.  ESR within normal limits, complements within normal limits, double-stranded DNA negative, anticardiolipin antibodies negative. We will repeat the following lab work today.  She will also be scheduled for an ultrasound of both hands to assess for synovitis.  Discussed that if the ultrasound is positive for synovitis we can try adding Imuran to her current regimen as combination therapy.  She is no longer breast-feeding and is currently taking oral contraceptives following recent pregnancy. She will follow-up in 2 months or sooner if necessary to discuss treatment options. - Plan: CBC with Differential/Platelet, COMPLETE METABOLIC PANEL WITH GFR, Anti-DNA antibody, double-stranded, Sedimentation rate, C3 and C4, Urinalysis, Routine w reflex microscopic   High risk medication use - Plaquenil 200 mg 1 tablet by mouth twice daily. PLQ eye exam: 05/18/2020 normal.  CBC and CMP updated on 10/18/20.  She is due to update CBC and CMP today. PLQ eye exam: 05/18/2020 normal. [facility]. Follow up in 1 year. - Plan: CBC with Differential/Platelet, COMPLETE METABOLIC PANEL WITH GFR She is no longer breast-feeding.  She is currently on oral contraceptive pills.  Anticardiolipin antibody positive: Anticardiolipin antibodies were negative on 11/28/2020.  Pain of both  elbows: She continues to have persistent pain in both elbow joints especially  at night.  She has tenderness along both elbow joint lines and over the right lateral epicondyle.    Pain in both hands: She has been experiencing increased pain and stiffness in both hands especially in the evenings.  She has been having to take Advil at bedtime to help her sleep at night due to nocturnal pain.  She describes the pain as a throbbing sensation.  On examination today she has tenderness over the right second and third PIPs and left fourth MCP and PIP joint.  She also has painful range of motion and tenderness over both wrist joints.  ESR will be updated today.  She will proceed with an ultrasound of both hands to assess for synovitis. Advised to hold advil 1 week prior to the ultrasound.   Chondromalacia of both patellae: She has good range of motion of both knee joints.  She has occasional discomfort in her knees.  She has not noticed any inflammation.  Pain of joint of left ankle and foot: She experiences intermittent discomfort in her ankle joints especially the left ankle.  No inflammation was noted on examination today.  Other medical conditions are listed as follows:  Vitamin D deficiency  History of migraine  Panic disorder  Generalized anxiety disorder  History of IBS  Family history of Crohn's disease  Family history of ulcerative colitis  Orders: Orders Placed This Encounter  Procedures   CBC with Differential/Platelet   COMPLETE METABOLIC PANEL WITH GFR   Anti-DNA antibody, double-stranded   Sedimentation rate   C3 and C4   Urinalysis, Routine w reflex microscopic   No orders of the defined types were placed in this encounter.    Follow-Up Instructions: Return in about 2 months (around 04/24/2021) for Autoimmune Disease.   Ofilia Neas, PA-C  Note - This record has been created using Dragon software.  Chart creation errors have been sought, but may not always  have been  located. Such creation errors do not reflect on  the standard of medical care.

## 2021-02-13 ENCOUNTER — Ambulatory Visit: Payer: BC Managed Care – PPO | Admitting: Neurology

## 2021-02-19 ENCOUNTER — Encounter: Payer: Self-pay | Admitting: Neurology

## 2021-02-20 NOTE — Telephone Encounter (Signed)
PA Response - Approved

## 2021-02-21 ENCOUNTER — Other Ambulatory Visit: Payer: Self-pay

## 2021-02-21 ENCOUNTER — Encounter: Payer: Self-pay | Admitting: Physician Assistant

## 2021-02-21 ENCOUNTER — Ambulatory Visit (INDEPENDENT_AMBULATORY_CARE_PROVIDER_SITE_OTHER): Payer: BC Managed Care – PPO | Admitting: Physician Assistant

## 2021-02-21 VITALS — BP 120/74 | HR 115 | Ht 68.5 in | Wt 235.0 lb

## 2021-02-21 DIAGNOSIS — Z8719 Personal history of other diseases of the digestive system: Secondary | ICD-10-CM

## 2021-02-21 DIAGNOSIS — R760 Raised antibody titer: Secondary | ICD-10-CM

## 2021-02-21 DIAGNOSIS — Z8669 Personal history of other diseases of the nervous system and sense organs: Secondary | ICD-10-CM

## 2021-02-21 DIAGNOSIS — E559 Vitamin D deficiency, unspecified: Secondary | ICD-10-CM

## 2021-02-21 DIAGNOSIS — M359 Systemic involvement of connective tissue, unspecified: Secondary | ICD-10-CM

## 2021-02-21 DIAGNOSIS — M25572 Pain in left ankle and joints of left foot: Secondary | ICD-10-CM

## 2021-02-21 DIAGNOSIS — M2242 Chondromalacia patellae, left knee: Secondary | ICD-10-CM

## 2021-02-21 DIAGNOSIS — F410 Panic disorder [episodic paroxysmal anxiety] without agoraphobia: Secondary | ICD-10-CM

## 2021-02-21 DIAGNOSIS — M2241 Chondromalacia patellae, right knee: Secondary | ICD-10-CM

## 2021-02-21 DIAGNOSIS — M79641 Pain in right hand: Secondary | ICD-10-CM

## 2021-02-21 DIAGNOSIS — Z8379 Family history of other diseases of the digestive system: Secondary | ICD-10-CM

## 2021-02-21 DIAGNOSIS — M25522 Pain in left elbow: Secondary | ICD-10-CM

## 2021-02-21 DIAGNOSIS — Z79899 Other long term (current) drug therapy: Secondary | ICD-10-CM | POA: Diagnosis not present

## 2021-02-21 DIAGNOSIS — F411 Generalized anxiety disorder: Secondary | ICD-10-CM

## 2021-02-21 DIAGNOSIS — M25521 Pain in right elbow: Secondary | ICD-10-CM

## 2021-02-21 DIAGNOSIS — M79642 Pain in left hand: Secondary | ICD-10-CM

## 2021-02-21 NOTE — Telephone Encounter (Signed)
noted 

## 2021-02-22 ENCOUNTER — Telehealth: Payer: Self-pay

## 2021-02-22 ENCOUNTER — Encounter: Payer: Self-pay | Admitting: Neurology

## 2021-02-22 LAB — CBC WITH DIFFERENTIAL/PLATELET
Absolute Monocytes: 377 cells/uL (ref 200–950)
Basophils Absolute: 81 cells/uL (ref 0–200)
Basophils Relative: 1.4 %
Eosinophils Absolute: 52 cells/uL (ref 15–500)
Eosinophils Relative: 0.9 %
HCT: 43.7 % (ref 35.0–45.0)
Hemoglobin: 14.5 g/dL (ref 11.7–15.5)
Lymphs Abs: 1786 cells/uL (ref 850–3900)
MCH: 30.2 pg (ref 27.0–33.0)
MCHC: 33.2 g/dL (ref 32.0–36.0)
MCV: 91 fL (ref 80.0–100.0)
MPV: 12.4 fL (ref 7.5–12.5)
Monocytes Relative: 6.5 %
Neutro Abs: 3503 cells/uL (ref 1500–7800)
Neutrophils Relative %: 60.4 %
Platelets: 313 10*3/uL (ref 140–400)
RBC: 4.8 10*6/uL (ref 3.80–5.10)
RDW: 12.8 % (ref 11.0–15.0)
Total Lymphocyte: 30.8 %
WBC: 5.8 10*3/uL (ref 3.8–10.8)

## 2021-02-22 LAB — URINALYSIS, ROUTINE W REFLEX MICROSCOPIC
Bilirubin Urine: NEGATIVE
Glucose, UA: NEGATIVE
Hgb urine dipstick: NEGATIVE
Ketones, ur: NEGATIVE
Leukocytes,Ua: NEGATIVE
Nitrite: NEGATIVE
Protein, ur: NEGATIVE
Specific Gravity, Urine: 1.01 (ref 1.001–1.035)
pH: 7.5 (ref 5.0–8.0)

## 2021-02-22 LAB — C3 AND C4
C3 Complement: 145 mg/dL (ref 83–193)
C4 Complement: 37 mg/dL (ref 15–57)

## 2021-02-22 LAB — COMPLETE METABOLIC PANEL WITH GFR
AG Ratio: 1.5 (calc) (ref 1.0–2.5)
ALT: 57 U/L — ABNORMAL HIGH (ref 6–29)
AST: 34 U/L — ABNORMAL HIGH (ref 10–30)
Albumin: 4.5 g/dL (ref 3.6–5.1)
Alkaline phosphatase (APISO): 49 U/L (ref 31–125)
BUN: 10 mg/dL (ref 7–25)
CO2: 25 mmol/L (ref 20–32)
Calcium: 9.6 mg/dL (ref 8.6–10.2)
Chloride: 103 mmol/L (ref 98–110)
Creat: 0.73 mg/dL (ref 0.50–0.97)
Globulin: 3.1 g/dL (calc) (ref 1.9–3.7)
Glucose, Bld: 85 mg/dL (ref 65–99)
Potassium: 4.2 mmol/L (ref 3.5–5.3)
Sodium: 139 mmol/L (ref 135–146)
Total Bilirubin: 0.7 mg/dL (ref 0.2–1.2)
Total Protein: 7.6 g/dL (ref 6.1–8.1)
eGFR: 113 mL/min/{1.73_m2} (ref >=60)

## 2021-02-22 LAB — ANTI-DNA ANTIBODY, DOUBLE-STRANDED: ds DNA Ab: 1 IU/mL

## 2021-02-22 LAB — SEDIMENTATION RATE: Sed Rate: 9 mm/h (ref 0–20)

## 2021-02-22 NOTE — Progress Notes (Signed)
dsDNA is negative

## 2021-02-22 NOTE — Telephone Encounter (Signed)
Patient contacted the office to review her labs results again and discuss the elevated liver functions. Patient advised the liver functions are elevated and that with her taking Ibuprofen on a nightly basis and that may be what is causing the elevation in her liver functions. Patient advised she should avoid Tylenol, NSAIDS and alcohol use. Patient states she is also taking a new medication ELYXYB. This is also a NSAID. Patient advised she should contact her neurologist to discuss. Patient expressed understanding.

## 2021-02-22 NOTE — Telephone Encounter (Signed)
Patient left a voicemail stating she was returning Andrea's call regarding labwork results.

## 2021-02-22 NOTE — Progress Notes (Signed)
CBC WNL. LFTs are elevated. Rest of CMP WNL. She has been taking advil on a nightly basis for pain relief.  Please advise the patient to avoid tylenol, NSAIDs, and alcohol use.   Complements and ESR WNL.  UA normal.

## 2021-02-28 ENCOUNTER — Ambulatory Visit (INDEPENDENT_AMBULATORY_CARE_PROVIDER_SITE_OTHER): Payer: BC Managed Care – PPO | Admitting: Rheumatology

## 2021-02-28 ENCOUNTER — Ambulatory Visit: Payer: BC Managed Care – PPO

## 2021-02-28 ENCOUNTER — Other Ambulatory Visit: Payer: Self-pay

## 2021-02-28 DIAGNOSIS — M79641 Pain in right hand: Secondary | ICD-10-CM | POA: Diagnosis not present

## 2021-02-28 DIAGNOSIS — M79642 Pain in left hand: Secondary | ICD-10-CM

## 2021-02-28 NOTE — Progress Notes (Signed)
Chief complaint: Pain in bilateral hands  Patient states that she continues to have pain and stiffness in her bilateral hands.  She has been taking Advil 3 tablets every night for pain relief.  She stopped taking Enbrel after her last visit.  Ultrasound of bilateral hands was performed to evaluate for synovitis and tenosynovitis.  The ultrasound examination of the bilateral hands was performed per EULAR recommendations. Using a 15 MHz transducer, grayscale and power Doppler bilateral second, third, and fifth MCP joints and bilateral wrist joints both dorsal and volar aspects were evaluated to look for synovitis or tenosynovitis. The findings were that there was no synovitis or tenosynovitis on ultrasound examination.  The right median nerve was 0.08 cm squares which was within normal limits and the left median nerve was 0.08 cm squares which was within normal limits.  Impression: Ultrasound examination did not show any synovitis or tenosynovitis.  Bilateral median nerves are within normal limits.  I discussed the ultrasound findings with the patient.  Natural anti-inflammatories were discussed.  She was advised not to take any NSAIDs due to elevated LFTs.  She also gained weight during pregnancy.  She is trying weight loss.  Pollyann Savoy, MD

## 2021-03-01 ENCOUNTER — Telehealth: Payer: Self-pay | Admitting: *Deleted

## 2021-03-01 NOTE — Telephone Encounter (Signed)
Kealey Balducci Key: [id number] PA sent for Ubrevly. Waiting on appoval from insurance company

## 2021-03-05 NOTE — Telephone Encounter (Signed)
Approved on December 17 Effective from 03/01/2021 through 02/28/2022.

## 2021-03-13 ENCOUNTER — Ambulatory Visit: Payer: BC Managed Care – PPO | Admitting: Neurology

## 2021-03-13 ENCOUNTER — Telehealth: Payer: Self-pay

## 2021-03-13 NOTE — Telephone Encounter (Signed)
I completed a PA for Nurtec on CMM, Key: [id number]. Per last OV note, "No longer breastfeeding. Sent in nurtec, ubrelvy stopped working. Also will prescribe a long-acting triptan to avoid reboumd and try elyxyb, try taking nurtec every day of period or ask obgyn if can go on continuous"  Awaiting determination from [facility]

## 2021-03-15 NOTE — Telephone Encounter (Signed)
?   Your request has been approved Effective from 03/13/2021 through 03/12/2022.

## 2021-04-01 ENCOUNTER — Encounter: Payer: Self-pay | Admitting: Family Medicine

## 2021-04-01 DIAGNOSIS — B029 Zoster without complications: Secondary | ICD-10-CM

## 2021-04-02 ENCOUNTER — Telehealth: Payer: Self-pay

## 2021-04-02 ENCOUNTER — Encounter: Payer: Self-pay | Admitting: Neurology

## 2021-04-02 NOTE — Telephone Encounter (Signed)
I returned phone call and spoke with [facility].  She stated that  shingles lesions are not improving.  I advised her to continue on antiviral medications.  She may hold hydroxychloroquine.  Her serology for autoimmune disease was negative.  I advised her to resume hydroxychloroquine once shingles lesions start crusting.

## 2021-04-02 NOTE — Telephone Encounter (Signed)
Patient called stating she was diagnosed with Shingles over the weekend and prescribed an anti-viral and steroid medications.  Patient requested a return call for advice on her Plaquenil medication.

## 2021-04-03 ENCOUNTER — Telehealth: Payer: Self-pay | Admitting: Psychiatry

## 2021-04-03 NOTE — Telephone Encounter (Signed)
Please review

## 2021-04-03 NOTE — Telephone Encounter (Signed)
Please let her know that Gabapentin is safe to take with her other medications and is sometimes prescribed off-label for anxiety. Recommend trying it since it will likely help with her nerve pain. It may cause some sleepiness.

## 2021-04-03 NOTE — Telephone Encounter (Signed)
Pt LVM stating she was diagnosed with Shingles over the weekend.  Her provider said she may benefit from taking Gabapentin for  the nerve pain, but asked her to check with Shanda Bumps to see if there might be any interactions with it and the Zoloft and Buspar she is on.  Next appt 2/28

## 2021-04-03 NOTE — Telephone Encounter (Signed)
Pt informed

## 2021-04-04 ENCOUNTER — Ambulatory Visit: Payer: BC Managed Care – PPO | Admitting: Neurology

## 2021-04-04 MED ORDER — LIDOCAINE 5 % EX OINT: TOPICAL_OINTMENT | CUTANEOUS | 2 refills | Status: DC

## 2021-04-04 MED ORDER — GABAPENTIN 100 MG PO CAPS: 100.0000 mg | ORAL_CAPSULE | Freq: Two times a day (BID) | ORAL | 2 refills | Status: DC | PRN

## 2021-04-07 ENCOUNTER — Other Ambulatory Visit: Payer: Self-pay | Admitting: Physician Assistant

## 2021-04-07 DIAGNOSIS — M359 Systemic involvement of connective tissue, unspecified: Secondary | ICD-10-CM

## 2021-04-09 ENCOUNTER — Telehealth: Payer: Self-pay | Admitting: Family Medicine

## 2021-04-09 ENCOUNTER — Telehealth: Payer: Self-pay

## 2021-04-09 ENCOUNTER — Encounter: Payer: Self-pay | Admitting: Family Medicine

## 2021-04-09 NOTE — Telephone Encounter (Signed)
Patient called stating she tested positive for Covid today 04/09/21.  Patient states she thought her symptoms were due to her Shingles diagnosis.  Patient states she spoke with Dr. Corliss Skains last week who told her she could resume or Hydroxychloroquine once her Shingles started to scab, but now she has Covid too and is not sure what to do.  Patient states she finished her prescriptions of Prednisone and Valtrex.  Patient states she is experiencing a low grade fever and is asking if she can take Tylenol.  Patient requested a return call.

## 2021-04-09 NOTE — Telephone Encounter (Signed)
Patient calling in with respiratory symptoms: Shortness of breath, chest pain, palpitations or other red words send to Triage  Does the patient have a fever over 100, cough, congestion, sore throat, runny nose, lost of taste/smell (please list symptoms that patient has)?fatigue, nasal congeston  What date did symptoms start? (If over 5 days ago, pt may be scheduled for in person visit)  Have you tested for Covid in the last 5 days? Yes   If yes, was it positive [x]  OR negative [] ? If positive in the last 5 days, please schedule virtual visit now. If negative, schedule for an in person OV with the next available provider if PCP has no openings. Please also let patient know they will be tested again (follow the script below)  "you will have to arrive prior to your appt time to be Covid tested. Please park in back of office at the [facility] & call [phone] to let the staff know you have arrived. A staff member will meet you at your car to do a rapid covid test. Once the test has resulted you will be notified by phone of your results to determine if appt will remain an in person visit or be converted to a virtual/phone visit. If you arrive less than before your appt time, your visit will be automatically converted to virtual & any recommended testing will happen AFTER the visit." Pt has virtual with stephanie 04-10-2021 9 am THINGS TO REMEMBER  If no availability for virtual visit in office,  please schedule another  office  If no availability at another [place] office, please instruct patient that they can schedule an evisit or virtual visit through their mychart account. Visits up to 8pm  patients can be seen in office 5 days after positive COVID test

## 2021-04-09 NOTE — Telephone Encounter (Signed)
Patient advised that she is okay to restart her PLQ after 10 days as instructed by Dr. Estanislado Pandy. Patient advised she should reach out her PCP for guidance regarding treatment and questions for Covid. Patient expressed understanding.

## 2021-04-09 NOTE — Telephone Encounter (Signed)
Next Visit: 04/26/2021  Last Visit: 02/21/2021  Labs: 02/21/2021 CBC WNL. LFTs are elevated. Rest of CMP WNL.  Eye exam: 05/18/2020 normal   Current Dose per office note 02/21/2021: Plaquenil 200 mg 1 tablet by mouth twice daily  [id number] disease   Last Fill: 01/09/2021  Okay to refill Plaquenil?

## 2021-04-10 ENCOUNTER — Telehealth: Payer: BC Managed Care – PPO | Admitting: Family

## 2021-04-10 ENCOUNTER — Telehealth: Payer: BC Managed Care – PPO | Admitting: Family Medicine

## 2021-04-10 ENCOUNTER — Ambulatory Visit: Payer: BC Managed Care – PPO | Admitting: Neurology

## 2021-04-10 ENCOUNTER — Telehealth (INDEPENDENT_AMBULATORY_CARE_PROVIDER_SITE_OTHER): Payer: BC Managed Care – PPO | Admitting: Family Medicine

## 2021-04-10 ENCOUNTER — Encounter: Payer: Self-pay | Admitting: Family Medicine

## 2021-04-10 VITALS — Temp 98.5°F

## 2021-04-10 DIAGNOSIS — U071 COVID-19: Secondary | ICD-10-CM | POA: Diagnosis not present

## 2021-04-10 MED ORDER — BENZONATATE 100 MG PO CAPS: 100.0000 mg | ORAL_CAPSULE | Freq: Three times a day (TID) | ORAL | 0 refills | Status: DC | PRN

## 2021-04-10 MED ORDER — MOLNUPIRAVIR EUA 200MG CAPSULE: 4.0000 | ORAL_CAPSULE | Freq: Two times a day (BID) | ORAL | 0 refills | Status: AC

## 2021-04-10 NOTE — Patient Instructions (Signed)
HOME CARE TIPS:   -I sent the medication(s) we discussed to your pharmacy: Meds ordered this encounter  Medications   molnupiravir EUA (LAGEVRIO) 200 mg CAPS capsule    Sig: Take 4 capsules (800 mg total) by mouth 2 (two) times daily for 5 days.    Dispense:  40 capsule    Refill:  0   benzonatate (TESSALON PERLES) 100 MG capsule    Sig: Take 1 capsule (100 mg total) by mouth 3 (three) times daily as needed.    Dispense:  30 capsule    Refill:  0     -I sent in the [place] treatment or referral you requested per our discussion. Please see the information provided below and discuss further with the pharmacist/treatment team.   -there is a chance of rebound illness after finishing your treatment. If you become sick again please isolate for an additional 5 days, plus 5 more days of masking.   -can use nasal saline a few times per day if you have nasal congestion  -stay hydrated, drink plenty of fluids and eat small healthy meals - avoid dairy  -can take 1000 IU (61mcg) Vit D3 and 100-500 mg of Vit C daily per instructions  -If the Covid test is positive, check out the [facility] website for more information on home care, transmission and treatment for COVID19  -follow up with your doctor in 2-3 days unless improving and feeling better  -stay home while sick, except to seek medical care. If you have COVID19, you will likely be contagious for 7-10 days. Flu or Influenza is likely contagious for about 7 days. Other respiratory viral infections remain contagious for 5-10+ days depending on the virus and many other factors. Wear a good mask that fits snugly (such as N95 or KN95) if around others to reduce the risk of transmission.  It was nice to meet you today, and I really hope you are feeling better soon. I help [facility] out with telemedicine visits on Tuesdays and Thursdays and am happy to help if you need a follow up virtual visit on those days. Otherwise, if you have any concerns or  questions following this visit please schedule a follow up visit with your Primary Care doctor or seek care at a local urgent care clinic to avoid delays in care.    Seek in person care or schedule a follow up video visit promptly if your symptoms worsen, new concerns arise or you are not improving with treatment. Call 911 and/or seek emergency care if your symptoms are severe or life threatening.    Fact Sheet for Patients And Caregivers Emergency Use Authorization (EUA) Of LAGEVRIO (molnupiravir) capsules For Coronavirus Disease 2019 (COVID-19)  What is the most important information I should know about LAGEVRIO? LAGEVRIO may cause serious side effects, including: ? LAGEVRIO may cause harm to your unborn baby. It is not known if LAGEVRIO will harm your baby if you take LAGEVRIO during pregnancy. o LAGEVRIO is not recommended for use in pregnancy. o LAGEVRIO has not been studied in pregnancy. LAGEVRIO was studied in pregnant animals only. When LAGEVRIO was given to pregnant animals, LAGEVRIO caused harm to their unborn babies. o You and your healthcare provider may decide that you should take LAGEVRIO during pregnancy if there are no other COVID-19 treatment options approved or authorized by the FDA that are accessible or clinically appropriate for you. o If you and your healthcare provider decide that you should take [place] during pregnancy, you and your healthcare  provider should discuss the known and potential benefits and the potential risks of taking LAGEVRIO during pregnancy. For individuals who are able to become pregnant: ? You should use a reliable method of birth control (contraception) consistently and correctly during treatment with LAGEVRIO and for 4 days after the last dose of LAGEVRIO. Talk to your healthcare provider about reliable birth control methods. ? Before starting treatment with [facility] your healthcare provider may do a pregnancy test to see if you are  pregnant before starting treatment with LAGEVRIO. ? Tell your healthcare provider right away if you become pregnant or think you may be pregnant during treatment with LAGEVRIO. Pregnancy Surveillance Program: ? There is a pregnancy surveillance program for individuals who take LAGEVRIO during pregnancy. The purpose of this program is to collect information about the health of you and your baby. Talk to your healthcare provider about how to take part in this program. ? If you take LAGEVRIO during pregnancy and you agree to participate in the pregnancy surveillance program and allow your healthcare provider to share your information with [place], then your healthcare provider will report your use of [place] during pregnancy to [place]. by calling [phone] or [email or web address]. For individuals who are sexually active with partners who are able to become pregnant: ? It is not known if LAGEVRIO can affect sperm. While the risk is regarded as low, animal studies to fully assess the potential for LAGEVRIO to affect the babies of males treated with LAGEVRIO have not been completed. A reliable method of birth control (contraception) should be used consistently and correctly during treatment with LAGEVRIO and for at least 3 months after the last dose. The risk to sperm beyond 3 months is not known. Studies to understand the risk to sperm beyond 3 months are ongoing. Talk to your healthcare provider about reliable birth control methods. Talk to your healthcare provider if you have questions or concerns about how LAGEVRIO may affect sperm. You are being given this fact sheet because your healthcare provider believes it is necessary to provide you with LAGEVRIO for the treatment of adults with mild-to-moderate coronavirus disease 2019 (COVID-19) with positive results of direct SARS-CoV-2 viral testing, and who are at high risk for progression to severe COVID-19  including hospitalization or death, and for whom other COVID-19 treatment options approved or authorized by the FDA are not accessible or clinically appropriate. The U.S. Food and Drug Administration (FDA) has issued an Emergency Use Authorization (EUA) to make LAGEVRIO available during the COVID-19 pandemic (for more details about an EUA please see What is an Emergency Use Authorization? at the end of this document). LAGEVRIO is not an FDA-approved medicine in the [place]. Read this Fact Sheet for information about LAGEVRIO. Talk to your healthcare provider about your options if you have any questions. It is your choice to take LAGEVRIO.  What is COVID-19? COVID-19 is caused by a virus called a coronavirus. You can get COVID-19 through close contact with another person who has the virus. COVID-19 illnesses have ranged from very mild-to-severe, including illness resulting in death. While information so far suggests that most COVID-19 illness is mild, serious illness can happen and may cause some of your other medical conditions to become worse. Older people and people of all ages with severe, long lasting (chronic) medical conditions like heart disease, lung disease and diabetes, for example seem to be at higher risk of being hospitalized for COVID-19.  What is LAGEVRIO? LAGEVRIO is  an investigational medicine used to treat mild-to-moderate COVID-19 in adults: ? with positive results of direct SARS-CoV-2 viral testing, and ? who are at high risk for progression to severe COVID-19 including hospitalization or death, and for whom other COVID-19 treatment options approved or authorized by the FDA are not accessible or clinically appropriate. The FDA has authorized the emergency use of LAGEVRIO for the treatment of mild-tomoderate COVID-19 in adults under an EUA. For more information on EUA, see the What is an Emergency Use Authorization (EUA)? section at the end of this Fact  Sheet. LAGEVRIO is not authorized: ? for use in people less than 5 years of age. ? for prevention of COVID-19. ? for people needing hospitalization for COVID-19. ? for use for longer than 5 consecutive days.  What should I tell my healthcare provider before I take LAGEVRIO? Tell your healthcare provider if you: ? Have any allergies ? Are breastfeeding or plan to breastfeed ? Have any serious illnesses ? Are taking any medicines (prescription, over-the-counter, vitamins, or herbal products).  How do I take LAGEVRIO? ? Take LAGEVRIO exactly as your healthcare provider tells you to take it. ? Take 4 capsules of LAGEVRIO every 12 hours (for example, at 8 am and at 8 pm) ? Take LAGEVRIO for 5 days. It is important that you complete the full 5 days of treatment with LAGEVRIO. Do not stop taking LAGEVRIO before you complete the full 5 days of treatment, even if you feel better. ? Take LAGEVRIO with or without food. ? You should stay in isolation for as long as your healthcare provider tells you to. Talk to your healthcare provider if you are not sure about how to properly isolate while you have COVID-19. ? Swallow LAGEVRIO capsules whole. Do not open, break, or crush the capsules. If you cannot swallow capsules whole, tell your healthcare provider. ? What to do if you miss a dose: o If it has been less than 10 hours since the missed dose, take it as soon as you remember o If it has been more than 10 hours since the missed dose, skip the missed dose and take your dose at the next scheduled time. ? Do not double the dose of LAGEVRIO to make up for a missed dose.  What are the important possible side effects of LAGEVRIO? ? See, What is the most important information I should know about LAGEVRIO? ? Allergic Reactions. Allergic reactions can happen in people taking LAGEVRIO, even after only 1 dose. Stop taking LAGEVRIO and call your healthcare provider right away if you get any of the  following symptoms of an allergic reaction: o hives o rapid heartbeat o trouble swallowing or breathing o swelling of the mouth, lips, or face o throat tightness o hoarseness o skin rash The most common side effects of LAGEVRIO are: ? diarrhea ? nausea ? dizziness These are not all the possible side effects of LAGEVRIO. Not many people have taken LAGEVRIO. Serious and unexpected side effects may happen. This medicine is still being studied, so it is possible that all of the risks are not known at this time.  What other treatment choices are there?  Veklury (remdesivir) is FDA-approved as an intravenous (IV) infusion for the treatment of mildto-moderate OFBPZ-02 in certain adults and children. Talk with your doctor to see if Marijean Heath is appropriate for you. Like LAGEVRIO, FDA may also allow for the emergency use of other medicines to treat people with COVID-19. Go to LacrosseProperties.si for more information. It  is your choice to be treated or not to be treated with LAGEVRIO. Should you decide not to take it, it will not change your standard medical care.  What if I am breastfeeding? Breastfeeding is not recommended during treatment with LAGEVRIO and for 4 days after the last dose of LAGEVRIO. If you are breastfeeding or plan to breastfeed, talk to your healthcare provider about your options and specific situation before taking LAGEVRIO.  How do I report side effects with LAGEVRIO? Contact your healthcare provider if you have any side effects that bother you or do not go away. Report side effects to FDA MedWatch at SmoothHits.hu or call 1-800-FDA-1088 (1- 458 132 4488).  How should I store [place]? ? Store LAGEVRIO capsules at room temperature between 15F to 60F (20C to 25C). ? Keep LAGEVRIO and all medicines out of the reach of children and pets. How can I learn more  about COVID-19? ? Ask your healthcare provider. ? Visit SeekRooms.co.uk ? Contact your local or state public health department. ? Call [place] at [phone] (toll free in the [place] ? Visit [email or web address].com  What Is an Emergency Use Authorization (EUA)? The [place] FDA has made [place] available under an emergency access mechanism called an Emergency Use Authorization (EUA) The EUA is supported by a Presenter, broadcasting Health and Human Service (HHS) declaration that circumstances exist to justify emergency use of drugs and biological products during the COVID-19 pandemic. LAGEVRIO for the treatment of mild-to-moderate COVID-19 in adults with positive results of direct SARS-CoV-2 viral testing, who are at high risk for progression to severe COVID-19, including hospitalization or death, and for whom alternative COVID-19 treatment options approved or authorized by FDA are not accessible or clinically appropriate, has not undergone the same type of review as an FDA-approved product. In issuing an EUA under the CBULA-45 public health emergency, the FDA has determined, among other things, that based on the total amount of scientific evidence available including data from adequate and well-controlled clinical trials, if available, it is reasonable to believe that the product may be effective for diagnosing, treating, or preventing COVID-19, or a serious or life-threatening disease or condition caused by COVID-19; that the known and potential benefits of the product, when used to diagnose, treat, or prevent such disease or condition, outweigh the known and potential risks of such product; and that there are no adequate, approved, and available alternatives.  All of these criteria must be met to allow for the product to be used in the treatment of patients during the COVID-19 pandemic. The EUA for LAGEVRIO is in effect for the duration of the COVID-19 declaration justifying  emergency use of LAGEVRIO, unless terminated or revoked (after which LAGEVRIO may no longer be used under the EUA). For patent information: http://rogers.info/ Copyright  2021-2022 [place] [place], [place] and its affiliates. All rights reserved. [id number] Revised: March 2022

## 2021-04-10 NOTE — Progress Notes (Signed)
Virtual Visit via Video Note  I connected with Jennifer Vincent  on 04/10/21 at 12:00 PM EST by a video enabled telemedicine application and verified that I am speaking with the correct person using two identifiers.  Location patient: [place] Location provider:work or home office Persons participating in the virtual visit: patient, provider  I discussed the limitations and requested verbal permission for telemedicine visit. The patient expressed understanding and agreed to proceed.   HPI:  Acute telemedicine visit for Covid19: -Onset: yesterday, tested positive yesterday -Symptoms include: runny nose, pnd, mild cough, fatigue, body aches, chills -Denies:fever, CP, SOB, NVD -Pertinent past medical history: see below, had shingles recently, had covid over a year ago -eGFR 113 in December -denies any chance pregnancy -has an auto-immune disorder and see a rheumatologist, on hydroxychlorquine -Pertinent medication allergies: Allergies  Allergen Reactions   Lactose Intolerance (Gi)     GI upset  -COVID-19 vaccine status: 2 doses and a booster Immunization History  Administered Date(s) Administered   Influenza-Unspecified 12/16/2016, 12/25/2017, 12/16/2018   PFIZER(Purple Top)SARS-COV-2 Vaccination 05/13/2019, 06/02/2019, 02/16/2020   PPD Test 10/13/2014     ROS: See pertinent positives and negatives per HPI.  Past Medical History:  Diagnosis Date   Allergic rhinitis    Anxiety    Autoimmune disorder ([place])    nonspecified   COVID 04/05/2020   Frequent headaches    IBS (irritable bowel syndrome)    Migraine    PCOS (polycystic ovarian syndrome)     Past Surgical History:  Procedure Laterality Date   TONSILLECTOMY       Current Outpatient Medications:    benzonatate (TESSALON PERLES) 100 MG capsule, Take 1 capsule (100 mg total) by mouth 3 (three) times daily as needed., Disp: 30 capsule, Rfl: 0   busPIRone (BUSPAR) 15 MG tablet, TAKE 2 TABLETS BY MOUTH TWICE A DAY, Disp: 360  tablet, Rfl: 0   Fremanezumab-vfrm (AJOVY) 225 MG/1.5ML SOAJ, Inject 225 mg into the skin every 30 (thirty) days., Disp: 1.5 mL, Rfl: 5   hydroxychloroquine (PLAQUENIL) 200 MG tablet, TAKE 1 TABLET BY MOUTH TWICE A DAY, Disp: 180 tablet, Rfl: 0   ibuprofen (ADVIL) 200 MG tablet, Take 200 mg by mouth every 6 (six) hours as needed., Disp: , Rfl:    lidocaine (XYLOCAINE) 5 % ointment, Apply sparingly up to TID for pain, Disp: 60 g, Rfl: 2   molnupiravir EUA (LAGEVRIO) 200 mg CAPS capsule, Take 4 capsules (800 mg total) by mouth 2 (two) times daily for 5 days., Disp: 40 capsule, Rfl: 0   Norethin-Eth Estrad-Fe Biphas (LO LOESTRIN FE PO), Take by mouth., Disp: , Rfl:    Prenatal Vit-Fe Fumarate-FA (PRENATAL MULTIVITAMIN) TABS tablet, Take 1 tablet by mouth daily at 12 noon., Disp: , Rfl:    sertraline (ZOLOFT) 100 MG tablet, Take 1 tablet (100 mg total) by mouth daily., Disp: 90 tablet, Rfl: 2   Ubrogepant (UBRELVY) 100 MG TABS, Take 1 tablet onset migraine, may take another 2 hours later if needed., Disp: 16 tablet, Rfl: 5   ELYXYB 120 MG/4.8ML SOLN, Take 120 mg by mouth daily as needed., Disp: 28.8 mL, Rfl: 6   gabapentin (NEURONTIN) 100 MG capsule, Take 1-3 capsules (100-300 mg total) by mouth 2 (two) times daily as needed. (Patient not taking: Reported on 04/10/2021), Disp: 90 capsule, Rfl: 2   methylPREDNISolone (MEDROL DOSEPAK) 4 MG TBPK tablet, Take pills daily all together with food. Take the first dose (6 pills) as soon as possible. Take the rest each morning.  For 6 days total 6-5-4-3-2-1., Disp: 21 tablet, Rfl: 1   naratriptan (AMERGE) 2.5 MG tablet, Take 1 tablet (2.5 mg total) by mouth as needed for migraine. Take one (1) tablet at onset of headache; if returns or does not resolve, may repeat after 2 hours; do not exceed five (5) mg in 24 hours., Disp: 9 tablet, Rfl: 11   Rimegepant Sulfate (NURTEC) 75 MG TBDP, Take 75 mg by mouth daily as needed. For migraines. Take as close to onset of migraine  as possible. One daily maximum., Disp: 8 tablet, Rfl: 6  EXAM:  VITALS per patient if applicable:  GENERAL: alert, oriented, appears well and in no acute distress  HEENT: atraumatic, conjunttiva clear, no obvious abnormalities on inspection of external nose and ears  NECK: normal movements of the head and neck  LUNGS: on inspection no signs of respiratory distress, breathing rate appears normal, no obvious gross SOB, gasping or wheezing  CV: no obvious cyanosis  MS: moves all visible extremities without noticeable abnormality  PSYCH/NEURO: pleasant and cooperative, no obvious depression or anxiety, speech and thought processing grossly intact  ASSESSMENT AND PLAN:  Discussed the following assessment and plan:  COVID-19   Discussed treatment options and risk of drug interactions, ideal treatment window, potential complications, isolation and precautions for COVID-19.  Discussed possibility of rebound with antivirals and the need to reisolate if it should occur for 5 days. Checked for/reviewed any labs done in the last 90 days with GFR listed in HPI if available. After lengthy discussion, the patient opted for treatment with Legevrio due to being higher risk for complications of covid or severe disease, her concerns with interactions with paxlovid and other factors. Discussed EUA status of this drug and the fact that there is preliminary limited knowledge of risks/interactions/side effects per EUA document vs possible benefits and precautions. This information was shared with patient during the visit and also was provided in patient instructions. She plans to check with her rheumatologist as well in terms of her hydroxychloroquine during tx. The patient declined referral for Covid outpatient treatment at this time. The patient did want a prescription for cough, Tessalon Rx sent.  Other symptomatic care measures summarized in patient instructions. Work/School slipped offered:   declined Advised to seek prompt virtual visit or in person care if worsening, new symptoms arise, or if is not improving with treatment as expected per our conversation of expected course. Discussed options for follow up care. Did let this patient know that I do telemedicine on Tuesdays and Thursdays for [place] and those are the days I am logged into the system. Advised to schedule follow up visit with PCP, [place] virtual visits or [facility] if any further questions or concerns to avoid delays in care.   I discussed the assessment and treatment plan with the patient. The patient was provided an opportunity to ask questions and all were answered. The patient agreed with the plan and demonstrated an understanding of the instructions.     Jennifer Kern, DO

## 2021-04-12 NOTE — Progress Notes (Deleted)
Office Visit Note  Patient: Jennifer Vincent             Date of Birth: 01/22/1991           MRN: [id number]             PCP: Caren Macadam, MD Referring: Caren Macadam, MD Visit Date: 04/26/2021 Occupation: @GUAROCC @  Subjective:  No chief complaint on file.   History of Present Illness: Jennifer Vincent is a 31 y.o. female ***   [place] of both hands 02/28/21 did not reveal synovitis   Activities of Daily Living:  Patient reports morning stiffness for *** {minute/hour:19697}.   Patient {ACTIONS;DENIES/REPORTS:21021675::"Denies"} nocturnal pain.  Difficulty dressing/grooming: {ACTIONS;DENIES/REPORTS:21021675::"Denies"} Difficulty climbing stairs: {ACTIONS;DENIES/REPORTS:21021675::"Denies"} Difficulty getting out of chair: {ACTIONS;DENIES/REPORTS:21021675::"Denies"} Difficulty using hands for taps, buttons, cutlery, and/or writing: {ACTIONS;DENIES/REPORTS:21021675::"Denies"}  No Rheumatology ROS completed.   PMFS History:  Patient Active Problem List   Diagnosis Date Noted   Antiphospholipid antibody syndrome ([place]) 10/02/2020   Chronic migraine without aura, with intractable migraine, so stated, with status migrainosus 06/01/2019   Pregnant 07/03/2018   Generalized anxiety disorder 01/11/2018   Panic disorder 10/04/2016   Anxiety 10/04/2016    Past Medical History:  Diagnosis Date   Allergic rhinitis    Anxiety    Autoimmune disorder ([place])    nonspecified   COVID 04/05/2020   Frequent headaches    IBS (irritable bowel syndrome)    Migraine    PCOS (polycystic ovarian syndrome)     Family History  Problem Relation Age of Onset   Depression Mother    Eating disorder Mother    Arthritis Father    Crohn's disease Father    Ulcerative colitis Father    Arthritis Brother    Alcohol abuse Brother    Mental illness Brother    Depression Brother    Anxiety disorder Brother    Crohn's disease Brother    Breast cancer Maternal Grandmother    Mental illness Maternal  Grandfather    Anxiety disorder Maternal Grandfather    OCD Maternal Grandfather    Heart disease Paternal Grandfather    Diabetes Paternal Grandfather    Heart attack Paternal Grandfather    Healthy Daughter    Healthy Son    Past Surgical History:  Procedure Laterality Date   TONSILLECTOMY     Social History   Social History Narrative   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   Physical Exam   Musculoskeletal Exam: ***  CDAI Exam: CDAI Score: -- Patient Global: --; Provider Global: -- Swollen: --; Tender: -- Joint Exam 04/26/2021   No joint exam has been documented for this visit   There is currently no information documented on the homunculus. Go to the Rheumatology activity and complete the homunculus joint exam.  Investigation: No additional findings.  Imaging: No results found.  Recent Labs: Lab Results  Component Value Date  WBC 5.8 02/21/2021   HGB 14.5 02/21/2021   PLT 313 02/21/2021   NA 139 02/21/2021   K 4.2 02/21/2021   CL 103 02/21/2021   CO2 25 02/21/2021   GLUCOSE 85 02/21/2021   BUN 10 02/21/2021   CREATININE 0.73 02/21/2021   BILITOT 0.7 02/21/2021   ALKPHOS 113 10/02/2020   AST 34 (H) 02/21/2021   ALT 57 (H) 02/21/2021   PROT 7.6 02/21/2021   ALBUMIN 3.4 (L) 10/02/2020   CALCIUM 9.6 02/21/2021   GFRAA 119 07/29/2019   QFTBGOLDPLUS NEGATIVE 07/29/2019    Speciality Comments: PLQ eye exam: 05/18/2020  normal. [facility]. Follow up in 1 year.  Procedures:  No procedures performed Allergies: Lactose intolerance (gi)   Assessment / Plan:     Visit Diagnoses: Autoimmune disease ([place])  High risk medication use  Anticardiolipin antibody positive  Pain of both elbows  Chondromalacia of both patellae  Pain of joint of left ankle and foot  Vitamin D deficiency  History of migraine  Panic disorder  Generalized anxiety disorder  History of IBS  Family history of Crohn's disease  Family history of ulcerative colitis  Orders: No orders of the defined types were placed in this encounter.  No orders of the defined types were placed in this encounter.   Face-to-face time spent with patient was *** minutes. Greater than 50% of time was spent in counseling and coordination of care.  Follow-Up Instructions: No follow-ups on file.   Ofilia Neas, PA-C  Note - This record has been created using Dragon software.  Chart creation errors have been sought, but may not always  have been located. Such creation errors do not reflect on  the standard of medical care.

## 2021-04-17 NOTE — Progress Notes (Signed)
Consent Form Botulism Toxin Injection For Chronic Migraine  Elyxyb A triptan Schedule back in 8 weeks   04/18/2021: Nurtec did nothing for patient.  However she has been doing well, she had COVID and we are late for her Botox appointment 15 weeks out, she has had a headache the last 3 days but nothing significant which is great considering related on her Botox appointment.  She is also due for Ajovy at the same time unfortunately.  I told her we would use samples in 8 weeks to get her on an alternating schedule with the Ajovy but that she could go home and take the Ajovy a few days early. +a.   Elyxyb caused elevated creatinine per patient, it helped but she stopped taking it due to this.  She is back to [place].  She is on continuous birth control and has not had a period which has also helped.   01/10/2021: Patient getting great relief, > 70% improvement in migraine and headache frequency. No longer breastfeeding. Sent in nurtec, ubrelvy stopped working. Also will prescribe a long-acting triptan to avoid reboumd and try elyxyb, try taking nurtec every day of period or ask obgyn if can go on continuous. +a.   No orders of the defined types were placed in this encounter.     Medications tried: topamax, maxalt, zoloft, imitrex, zonegran, propranolol, zofran,  Amitriptyline,flexeril, ajovy, zonegran  Reviewed orally with patient, additionally signature is on file:  Botulism toxin has been approved by the Federal drug administration for treatment of chronic migraine. Botulism toxin does not cure chronic migraine and it may not be effective in some patients.  The administration of botulism toxin is accomplished by injecting a small amount of toxin into the muscles of the neck and head. Dosage must be titrated for each individual. Any benefits resulting from botulism toxin tend to wear off after 3 months with a repeat injection required if benefit is to be maintained. Injections are usually done  every 3-4 months with maximum effect peak achieved by about 2 or 3 weeks. Botulism toxin is expensive and you should be sure of what costs you will incur resulting from the injection.  The side effects of botulism toxin use for chronic migraine may include:   -Transient, and usually mild, facial weakness with facial injections  -Transient, and usually mild, head or neck weakness with head/neck injections  -Reduction or loss of forehead facial animation due to forehead muscle weakness  -Eyelid drooping  -Dry eye  -Pain at the site of injection or bruising at the site of injection  -Double vision  -Potential unknown long term risks  Contraindications: You should not have Botox if you are pregnant, nursing, allergic to albumin, have an infection, skin condition, or muscle weakness at the site of the injection, or have myasthenia gravis, Lambert-Eaton syndrome, or ALS.  It is also possible that as with any injection, there may be an allergic reaction or no effect from the medication. Reduced effectiveness after repeated injections is sometimes seen and rarely infection at the injection site may occur. All care will be taken to prevent these side effects. If therapy is given over a long time, atrophy and wasting in the muscle injected may occur. Occasionally the patient's become refractory to treatment because they develop antibodies to the toxin. In this event, therapy needs to be modified.  I have read the above information and consent to the administration of botulism toxin.    BOTOX PROCEDURE NOTE FOR MIGRAINE HEADACHE  Contraindications and precautions discussed with patient(above). Aseptic procedure was observed and patient tolerated procedure. Procedure performed by Dr. Artemio Aly  The condition has existed for more than 6 months, and pt does not have a diagnosis of ALS, Myasthenia Gravis or Lambert-Eaton Syndrome.  Risks and benefits of injections discussed and pt agrees to proceed  with the procedure.  Written consent obtained  These injections are medically necessary. Pt  receives good benefits from these injections. These injections do not cause sedations or hallucinations which the oral therapies may cause.  Description of procedure:  The patient was placed in a sitting position. The standard protocol was used for Botox as follows, with 5 units of Botox injected at each site:   -Procerus muscle, midline injection  -Corrugator muscle, bilateral injection  -Frontalis muscle, bilateral injection, with 2 sites each side, medial injection was performed in the upper one third of the frontalis muscle, in the region vertical from the medial inferior edge of the superior orbital rim. The lateral injection was again in the upper one third of the forehead vertically above the lateral limbus of the cornea, 1.5 cm lateral to the medial injection site.  -Temporalis muscle injection, 4 sites, bilaterally. The first injection was 3 cm above the tragus of the ear, second injection site was 1.5 cm to 3 cm up from the first injection site in line with the tragus of the ear. The third injection site was 1.5-3 cm forward between the first 2 injection sites. The fourth injection site was 1.5 cm posterior to the second injection site.   -Occipitalis muscle injection, 3 sites, bilaterally. The first injection was done one half way between the occipital protuberance and the tip of the mastoid process behind the ear. The second injection site was done lateral and superior to the first, 1 fingerbreadth from the first injection. The third injection site was 1 fingerbreadth superiorly and medially from the first injection site.  -Cervical paraspinal muscle injection, 2 sites, bilateral knee first injection site was 1 cm from the midline of the cervical spine, 3 cm inferior to the lower border of the occipital protuberance. The second injection site was 1.5 cm superiorly and laterally to the first  injection site.  -Trapezius muscle injection was performed at 3 sites, bilaterally. The first injection site was in the upper trapezius muscle halfway between the inflection point of the neck, and the acromion. The second injection site was one half way between the acromion and the first injection site. The third injection was done between the first injection site and the inflection point of the neck.   Will return for repeat injection in 3 months.   155 units of Botox was used, 45U  Botox not injected was wasted. The patient tolerated the procedure well, there were no complications of the above procedure.

## 2021-04-18 ENCOUNTER — Ambulatory Visit (INDEPENDENT_AMBULATORY_CARE_PROVIDER_SITE_OTHER): Payer: BC Managed Care – PPO | Admitting: Neurology

## 2021-04-18 DIAGNOSIS — G43711 Chronic migraine without aura, intractable, with status migrainosus: Secondary | ICD-10-CM | POA: Diagnosis not present

## 2021-04-18 NOTE — Progress Notes (Signed)
Botox- 200 units x 1 vial Lot:[id number] Expiration 11/2023 NDC: 1443-1540-08  0.9% Sodium Chloride- 32mL total Lot: [id number] Expiration: 04/2022 NDC: 0932-6712-45  Dx: Y09.983 B/B

## 2021-04-19 ENCOUNTER — Other Ambulatory Visit: Payer: Self-pay | Admitting: Psychiatry

## 2021-04-19 DIAGNOSIS — F411 Generalized anxiety disorder: Secondary | ICD-10-CM

## 2021-04-22 ENCOUNTER — Encounter: Payer: Self-pay | Admitting: Neurology

## 2021-04-23 ENCOUNTER — Ambulatory Visit: Payer: BC Managed Care – PPO | Admitting: Family Medicine

## 2021-04-23 ENCOUNTER — Telehealth: Payer: Self-pay | Admitting: Neurology

## 2021-04-23 NOTE — Telephone Encounter (Signed)
Received approval from [facility]. Reference #[id number] (05/08/2021-04/09/2022).

## 2021-04-26 ENCOUNTER — Ambulatory Visit: Payer: BC Managed Care – PPO | Admitting: Physician Assistant

## 2021-04-26 DIAGNOSIS — F411 Generalized anxiety disorder: Secondary | ICD-10-CM

## 2021-04-26 DIAGNOSIS — M25521 Pain in right elbow: Secondary | ICD-10-CM

## 2021-04-26 DIAGNOSIS — F410 Panic disorder [episodic paroxysmal anxiety] without agoraphobia: Secondary | ICD-10-CM

## 2021-04-26 DIAGNOSIS — Z8669 Personal history of other diseases of the nervous system and sense organs: Secondary | ICD-10-CM

## 2021-04-26 DIAGNOSIS — R760 Raised antibody titer: Secondary | ICD-10-CM

## 2021-04-26 DIAGNOSIS — Z8719 Personal history of other diseases of the digestive system: Secondary | ICD-10-CM

## 2021-04-26 DIAGNOSIS — Z79899 Other long term (current) drug therapy: Secondary | ICD-10-CM

## 2021-04-26 DIAGNOSIS — M359 Systemic involvement of connective tissue, unspecified: Secondary | ICD-10-CM

## 2021-04-26 DIAGNOSIS — M2241 Chondromalacia patellae, right knee: Secondary | ICD-10-CM

## 2021-04-26 DIAGNOSIS — M25572 Pain in left ankle and joints of left foot: Secondary | ICD-10-CM

## 2021-04-26 DIAGNOSIS — E559 Vitamin D deficiency, unspecified: Secondary | ICD-10-CM

## 2021-04-26 DIAGNOSIS — Z8379 Family history of other diseases of the digestive system: Secondary | ICD-10-CM

## 2021-05-04 ENCOUNTER — Ambulatory Visit (INDEPENDENT_AMBULATORY_CARE_PROVIDER_SITE_OTHER): Payer: BC Managed Care – PPO | Admitting: Family Medicine

## 2021-05-04 ENCOUNTER — Ambulatory Visit: Payer: BC Managed Care – PPO | Admitting: Family Medicine

## 2021-05-04 VITALS — BP 102/80 | HR 104 | Temp 98.1°F | Ht 68.5 in | Wt 227.8 lb

## 2021-05-04 DIAGNOSIS — F411 Generalized anxiety disorder: Secondary | ICD-10-CM

## 2021-05-04 DIAGNOSIS — L539 Erythematous condition, unspecified: Secondary | ICD-10-CM

## 2021-05-04 MED ORDER — METRONIDAZOLE 1 % EX GEL: Freq: Every day | CUTANEOUS | 2 refills | Status: DC

## 2021-05-04 NOTE — Progress Notes (Signed)
Jennifer Vincent DOB: 07/10/1990 Encounter date: 05/04/2021  This is a 31 y.o. female who presents with Chief Complaint  Patient presents with   Rash    Patient complains of "skin flare up and redness" x6 months   Anxiety    Patient complains of increased anxiety x2 months, states medications are being managed by psychiatrist   Obesity    History of present illness: Last visit with me was 01/2019 to establish care.  Had second child 7 mo ago. He has had a lot of health issues in last few months. A lot of breathing issues- rsv, etc. Increased her anxiety, increased stressors.had shingles in [place], then got COVID.   Feels like body has just not been cooperating.   Migraine flares after covid. Dr. Lucia Gaskins has helped w migraines tremendously.  Seeing rheumatology - not sure if cheek rash is related to butterfly rash/autoimmune or something else. Cheeks just feel inflammed. Plaquenil has helped to manage achiness,which she is greatful for.   Has been on same buspar and zoloft regimen for last few years and it has been ok. Feels that stress is more situational. Zoloft 100mg  daily and buspar 20mg  BID.   Allergies  Allergen Reactions   Lactose Intolerance (Gi)     GI upset   Current Meds  Medication Sig   busPIRone (BUSPAR) 15 MG tablet TAKE 2 TABLETS BY MOUTH TWICE A DAY   Fremanezumab-vfrm (AJOVY) 225 MG/1.5ML SOAJ Inject 225 mg into the skin every 30 (thirty) days.   hydroxychloroquine (PLAQUENIL) 200 MG tablet TAKE 1 TABLET BY MOUTH TWICE A DAY   ibuprofen (ADVIL) 200 MG tablet Take 200 mg by mouth every 6 (six) hours as needed.   metroNIDAZOLE (METROGEL) 1 % gel Apply topically daily.   Norethin-Eth Estrad-Fe Biphas (LO LOESTRIN FE PO) Take by mouth.   Prenatal Vit-Fe Fumarate-FA (PRENATAL MULTIVITAMIN) TABS tablet Take 1 tablet by mouth daily at 12 noon.   sertraline (ZOLOFT) 100 MG tablet Take 1 tablet (100 mg total) by mouth daily.   Ubrogepant (UBRELVY) 100 MG TABS Take 1 tablet  onset migraine, may take another 2 hours later if needed.    Review of Systems  Constitutional:  Negative for chills, fatigue and fever.  Respiratory:  Negative for cough, chest tightness, shortness of breath and wheezing.   Cardiovascular:  Negative for chest pain, palpitations and leg swelling.  Musculoskeletal:  Positive for arthralgias.  Skin:  Positive for rash.  Neurological:  Positive for headaches.  Psychiatric/Behavioral:  Positive for sleep disturbance. The patient is nervous/anxious.    Objective:  BP 102/80 (BP Location: Left Arm, Patient Position: Sitting, Cuff Size: Large)    Pulse (!) 104    Temp 98.1 F (36.7 C) (Oral)    Ht 5' 8.5" (1.74 m)    Wt 227 lb 12.8 oz (103.3 kg)    SpO2 97%    Breastfeeding No    BMI 34.13 kg/m   Weight: 227 lb 12.8 oz (103.3 kg)   BP Readings from Last 3 Encounters:  05/04/21 102/80  02/21/21 120/74  11/22/20 139/87   Wt Readings from Last 3 Encounters:  05/04/21 227 lb 12.8 oz (103.3 kg)  02/21/21 235 lb (106.6 kg)  11/22/20 245 lb (111.1 kg)    Physical Exam Constitutional:      General: She is not in acute distress.    Appearance: She is well-developed.  Cardiovascular:     Rate and Rhythm: Normal rate and regular rhythm.     Heart  sounds: Normal heart sounds. No murmur heard.   No friction rub.  Pulmonary:     Effort: Pulmonary effort is normal. No respiratory distress.     Breath sounds: Normal breath sounds. No wheezing or rales.  Musculoskeletal:     Right lower leg: No edema.     Left lower leg: No edema.  Skin:    Comments: Bilateral cheeks redness and slight telangiectasias noted.  Neurological:     Mental Status: She is alert and oriented to person, place, and time.  Psychiatric:        Behavior: Behavior normal.    Assessment/Plan  1. Facial erythema We are going to try MetroGel for her face to see if this will help calm down redness.  Although I suspect this is more tied into her autoimmune disease, it  does resemble rosacea in appearance.  Consider follow-up with dermatology.  2. Generalized anxiety disorder Significant amount of stress right now sick child.  She is okay with staying on her current dose of mood medications, but will reach out to me in a couple of weeks to let me know if she wants to adjust doses at all.  She feels that once she makes it through some of the specialty visits with her son in the next couple weeks things will improve.  Return in about 2 months (around 07/02/2021). 35 minutes spent with patient in discussion of stressors, possibilities for medication adjustment, follow up treatment plan, exam, charting.   Theodis Shove, MD

## 2021-05-14 NOTE — Progress Notes (Deleted)
? ?Office Visit Note ? ?Patient: Jennifer Vincent             ?Date of Birth: 04-16-1990           ?MRN: [id number]             ?PCP: Caren Macadam, MD ?Referring: Caren Macadam, MD ?Visit Date: 05/28/2021 ?Occupation: _0 @ ? ?Subjective:  ? ? ?History of Present Illness: Jennifer Vincent is a 31 y.o. female with history of autoimmune disease. She is taking plaquenil 200 mg 1 tablet by mouth twice daily.   ? ?CBC and CMP updated on 02/21/21.  PLQ eye exam: 05/18/2020 normal. [facility]. Follow up in 1 year.  Patient was given an updated plaquenil eye examination form to take with her to her next appointment.  ? ?Lab work from 02/21/21 was reviewed today in the office: dsDNA negative, complements WNL,  and ESR WNL.   ?She had an ultrasound of both hands on 02/28/21 which did not reveal any findings consistent with synovitis.  ? ?Activities of Daily Living:  ?Patient reports morning stiffness for *** {minute/hour:19697}.   ?Patient {ACTIONS;DENIES/REPORTS:21021675::"Denies"} nocturnal pain.  ?Difficulty dressing/grooming: {ACTIONS;DENIES/REPORTS:21021675::"Denies"} ?Difficulty climbing stairs: {ACTIONS;DENIES/REPORTS:21021675::"Denies"} ?Difficulty getting out of chair: {ACTIONS;DENIES/REPORTS:21021675::"Denies"} ?Difficulty using hands for taps, buttons, cutlery, and/or writing: {ACTIONS;DENIES/REPORTS:21021675::"Denies"} ? ?No Rheumatology ROS completed.  ? ?PMFS History:  ?Patient Active Problem List  ? Diagnosis Date Noted  ? Antiphospholipid antibody syndrome ([place]) 10/02/2020  ? Chronic migraine without aura, with intractable migraine, so stated, with status migrainosus 06/01/2019  ? Pregnant 07/03/2018  ? Generalized anxiety disorder 01/11/2018  ? Panic disorder 10/04/2016  ? Anxiety 10/04/2016  ?  ?Past Medical History:  ?Diagnosis Date  ? Allergic rhinitis   ? Anxiety   ? Autoimmune disorder ([place])   ? nonspecified  ? COVID 04/05/2020  ? Frequent headaches   ? IBS (irritable bowel syndrome)   ? Migraine   ?  PCOS (polycystic ovarian syndrome)   ?  ?Family History  ?Problem Relation Age of Onset  ? Depression Mother   ? Eating disorder Mother   ? Arthritis Father   ? Crohn's disease Father   ? Ulcerative colitis Father   ? Arthritis Brother   ? Alcohol abuse Brother   ? Mental illness Brother   ? Depression Brother   ? Anxiety disorder Brother   ? Crohn's disease Brother   ? Breast cancer Maternal Grandmother   ? Mental illness Maternal Grandfather   ? Anxiety disorder Maternal Grandfather   ? OCD Maternal Grandfather   ? Heart disease Paternal Grandfather   ? Diabetes Paternal Grandfather   ? Heart attack Paternal Grandfather   ? Healthy Daughter   ? Healthy Son   ? ?Past Surgical History:  ?Procedure Laterality Date  ? TONSILLECTOMY    ? ?Social History  ? ?Social History Narrative  ? 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   ? Home Situation: lives with husband and daughter  ?   ? Spiritual Beliefs: Jewish  ?   ? Lifestyle:  regular, aerobic and orange theory; diet healthy   ?   ? Update: uses peloton 5x a week  ?   ? Right handed  ?   ? Caffeine: 1 cup/day  ? ?Immunization History  ?Administered Date(s) Administered  ? Influenza-Unspecified 12/16/2016, 12/25/2017, 12/16/2018, 01/16/2021  ? PFIZER(Purple Top)SARS-COV-2 Vaccination 05/13/2019, 06/02/2019, 02/16/2020  ? PPD Test 10/13/2014  ?  ? ?Objective: ?Vital Signs: There were no vitals taken for  this visit.  ? ?Physical Exam ?Vitals and nursing note reviewed.  ?Constitutional:   ?   Appearance: She is well-developed.  ?HENT:  ?   Head: Normocephalic and atraumatic.  ?Eyes:  ?   Conjunctiva/sclera: Conjunctivae normal.  ?Cardiovascular:  ?   Rate and Rhythm: Normal rate and regular rhythm.  ?   Heart sounds: Normal heart sounds.  ?Pulmonary:  ?   Effort: Pulmonary effort is normal.  ?   Breath sounds: Normal breath sounds.  ?Abdominal:  ?   General: Bowel sounds are normal.  ?   Palpations: Abdomen is soft.  ?Musculoskeletal:  ?   Cervical back: Normal range  of motion.  ?Lymphadenopathy:  ?   Cervical: No cervical adenopathy.  ?Skin: ?   General: Skin is warm and dry.  ?   Capillary Refill: Capillary refill takes less than 2 seconds.  ?Neurological:  ?   Mental Status: She is alert and oriented to person, place, and time.  ?Psychiatric:     ?   Behavior: Behavior normal.  ?  ? ?Musculoskeletal Exam: *** ? ?CDAI Exam: ?CDAI Score: -- ?Patient Global: --; Provider Global: -- ?Swollen: --; Tender: -- ?Joint Exam 05/28/2021  ? ?No joint exam has been documented for this visit  ? ?There is currently no information documented on the homunculus. Go to the Rheumatology activity and complete the homunculus joint exam. ? ?Investigation: ?No additional findings. ? ?Imaging: ?No results found. ? ?Recent Labs: ?Lab Results  ?Component Value Date  ? WBC 5.8 02/21/2021  ? HGB 14.5 02/21/2021  ? PLT 313 02/21/2021  ? NA 139 02/21/2021  ? K 4.2 02/21/2021  ? CL 103 02/21/2021  ? CO2 25 02/21/2021  ? GLUCOSE 85 02/21/2021  ? BUN 10 02/21/2021  ? CREATININE 0.73 02/21/2021  ? BILITOT 0.7 02/21/2021  ? ALKPHOS 113 10/02/2020  ? AST 34 (H) 02/21/2021  ? ALT 57 (H) 02/21/2021  ? PROT 7.6 02/21/2021  ? ALBUMIN 3.4 (L) 10/02/2020  ? CALCIUM 9.6 02/21/2021  ? GFRAA 119 07/29/2019  ? QFTBGOLDPLUS NEGATIVE 07/29/2019  ? ? ?Speciality Comments: PLQ eye exam: 05/18/2020 normal. [facility]. Follow up in 1 year. ? ?Procedures:  ?No procedures performed ?Allergies: Lactose intolerance (gi)  ? ?Assessment / Plan:     ?Visit Diagnoses: No diagnosis found. ? ?Orders: ?No orders of the defined types were placed in this encounter. ? ?No orders of the defined types were placed in this encounter. ? ? ?Face-to-face time spent with patient was *** minutes. Greater than 50% of time was spent in counseling and coordination of care. ? ?Follow-Up Instructions: No follow-ups on file. ? ? ?Earnestine Mealing, CMA ? ?Note - This record has been created using [employer].  ?Chart creation errors have been sought,  but may not always  ?have been located. Such creation errors do not reflect on  ?the standard of medical care.  ?

## 2021-05-15 ENCOUNTER — Ambulatory Visit: Payer: BC Managed Care – PPO | Admitting: Psychiatry

## 2021-05-18 ENCOUNTER — Encounter: Payer: Self-pay | Admitting: Neurology

## 2021-05-21 ENCOUNTER — Other Ambulatory Visit: Payer: Self-pay | Admitting: Neurology

## 2021-05-21 MED ORDER — METHYLPREDNISOLONE 4 MG PO TBPK: ORAL_TABLET | ORAL | 1 refills | Status: DC

## 2021-05-22 ENCOUNTER — Encounter: Payer: Self-pay | Admitting: Neurology

## 2021-05-28 ENCOUNTER — Ambulatory Visit: Payer: BC Managed Care – PPO | Admitting: Physician Assistant

## 2021-05-28 DIAGNOSIS — F410 Panic disorder [episodic paroxysmal anxiety] without agoraphobia: Secondary | ICD-10-CM

## 2021-05-28 DIAGNOSIS — M2241 Chondromalacia patellae, right knee: Secondary | ICD-10-CM

## 2021-05-28 DIAGNOSIS — Z79899 Other long term (current) drug therapy: Secondary | ICD-10-CM

## 2021-05-28 DIAGNOSIS — M25572 Pain in left ankle and joints of left foot: Secondary | ICD-10-CM

## 2021-05-28 DIAGNOSIS — M25521 Pain in right elbow: Secondary | ICD-10-CM

## 2021-05-28 DIAGNOSIS — Z8669 Personal history of other diseases of the nervous system and sense organs: Secondary | ICD-10-CM

## 2021-05-28 DIAGNOSIS — M79641 Pain in right hand: Secondary | ICD-10-CM

## 2021-05-28 DIAGNOSIS — R760 Raised antibody titer: Secondary | ICD-10-CM

## 2021-05-28 DIAGNOSIS — M359 Systemic involvement of connective tissue, unspecified: Secondary | ICD-10-CM

## 2021-05-28 DIAGNOSIS — Z8719 Personal history of other diseases of the digestive system: Secondary | ICD-10-CM

## 2021-05-28 DIAGNOSIS — F411 Generalized anxiety disorder: Secondary | ICD-10-CM

## 2021-05-28 DIAGNOSIS — E559 Vitamin D deficiency, unspecified: Secondary | ICD-10-CM

## 2021-05-28 DIAGNOSIS — Z8379 Family history of other diseases of the digestive system: Secondary | ICD-10-CM

## 2021-05-28 DIAGNOSIS — M25522 Pain in left elbow: Secondary | ICD-10-CM

## 2021-05-28 DIAGNOSIS — M79642 Pain in left hand: Secondary | ICD-10-CM

## 2021-05-29 ENCOUNTER — Telehealth (INDEPENDENT_AMBULATORY_CARE_PROVIDER_SITE_OTHER): Payer: BC Managed Care – PPO | Admitting: Adult Health

## 2021-05-29 DIAGNOSIS — G43711 Chronic migraine without aura, intractable, with status migrainosus: Secondary | ICD-10-CM

## 2021-05-29 MED ORDER — NARATRIPTAN HCL 2.5 MG PO TABS: 2.5000 mg | ORAL_TABLET | ORAL | 3 refills | Status: DC | PRN

## 2021-05-29 NOTE — Progress Notes (Signed)
PATIENT: Jennifer Vincent DOB: August 20, 1990  REASON FOR VISIT: follow up HISTORY FROM: patient  Virtual Visit via Video Note  I connected with Jennifer Vincent on 05/29/21 at 11:00 AM EDT by a video enabled telemedicine application located remotely at [facility] and verified that I am speaking with the correct person using two identifiers who was located at their own home.   I discussed the limitations of evaluation and management by telemedicine and the availability of in person appointments. The patient expressed understanding and agreed to proceed.   PATIENT: Jennifer Vincent DOB: Feb 09, 1991  REASON FOR VISIT: follow up HISTORY FROM: patient  HISTORY OF PRESENT ILLNESS: Today 05/29/21:  Jennifer Vincent is a 31 year old female with a history of migraine headaches.  She returns today for virtual visit.  The patient reports that she has been having a lot of breakthrough headaches.  This originally started after having COVID.  She also had a breakthrough menstrual cycle and in the past her menstrual cycles have been a big trigger for her migraines.  Also the barometric pressure has been a trigger.  She did a Medrol Dosepak last week and did not have a headache Thursday Friday Saturday or Sunday after taking the medication.  Yesterday she developed a migraine and used her [name] this morning she also woke up with a headache and is already taking [place].  She states that the [place] does not resolve her headaches but typically just takes the edge off.  She has tried triptans in the past but she felt that it caused rebound headaches however this was prior to her being on any preventative medication.  She has tried Nurtec in the past but did not find it beneficial.  She tried Elyxyb and felt that it was helpful however her rheumatologist instructed her not to take NSAIDs due to elevated liver enzymes.  She goes back next month for repeat blood work.    REVIEW OF SYSTEMS: Out of a complete 14  system review of symptoms, the patient complains only of the following symptoms, and all other reviewed systems are negative.  ALLERGIES: Allergies  Allergen Reactions   Lactose Intolerance (Gi)     GI upset    HOME MEDICATIONS: Outpatient Medications Prior to Visit  Medication Sig Dispense Refill   methylPREDNISolone (MEDROL DOSEPAK) 4 MG TBPK tablet Take pills daily all together with food. Take the first dose (6 pills) as soon as possible. Take the rest each morning. For 6 days total 6-5-4-3-2-1. 21 tablet 1   busPIRone (BUSPAR) 15 MG tablet TAKE 2 TABLETS BY MOUTH TWICE A DAY 360 tablet 0   Fremanezumab-vfrm (AJOVY) 225 MG/1.5ML SOAJ Inject 225 mg into the skin every 30 (thirty) days. 1.5 mL 5   hydroxychloroquine (PLAQUENIL) 200 MG tablet TAKE 1 TABLET BY MOUTH TWICE A DAY 180 tablet 0   ibuprofen (ADVIL) 200 MG tablet Take 200 mg by mouth every 6 (six) hours as needed.     metroNIDAZOLE (METROGEL) 1 % gel Apply topically daily. 45 g 2   Norethin-Eth Estrad-Fe Biphas (LO LOESTRIN FE PO) Take by mouth.     Prenatal Vit-Fe Fumarate-FA (PRENATAL MULTIVITAMIN) TABS tablet Take 1 tablet by mouth daily at 12 noon.     sertraline (ZOLOFT) 100 MG tablet Take 1 tablet (100 mg total) by mouth daily. 90 tablet 2   Ubrogepant (UBRELVY) 100 MG TABS Take 1 tablet onset migraine, may take another 2 hours later if needed. 16 tablet 5   No  facility-administered medications prior to visit.    PAST MEDICAL HISTORY: Past Medical History:  Diagnosis Date   Allergic rhinitis    Anxiety    Autoimmune disorder (HCC)    nonspecified   COVID 04/05/2020   Frequent headaches    IBS (irritable bowel syndrome)    Migraine    PCOS (polycystic ovarian syndrome)     PAST SURGICAL HISTORY: Past Surgical History:  Procedure Laterality Date   TONSILLECTOMY      FAMILY HISTORY: Family History  Problem Relation Age of Onset   Depression Mother    Eating disorder Mother    Arthritis Father     Crohn's disease Father    Ulcerative colitis Father    Arthritis Brother    Alcohol abuse Brother    Mental illness Brother    Depression Brother    Anxiety disorder Brother    Crohn's disease Brother    Breast cancer Maternal Grandmother    Mental illness Maternal Grandfather    Anxiety disorder Maternal Grandfather    OCD Maternal Grandfather    Heart disease Paternal Grandfather    Diabetes Paternal Grandfather    Heart attack Paternal Grandfather    Healthy Daughter    Healthy Son     SOCIAL HISTORY: Social History   Socioeconomic History   Marital status: Married    Spouse name: Not on file   Number of children: 1   Years of education: Not on file   Highest education level: Master's degree (e.g., MA, MS, MEng, MEd, MSW, MBA)  Occupational History   Not on file  Tobacco Use   Smoking status: Never   Smokeless tobacco: Never  Vaping Use   Vaping Use: Never used  Substance and Sexual Activity   Alcohol use: Not Currently   Drug use: Never   Sexual activity: Yes    Birth control/protection: None  Other Topics Concern   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      Home Situation: lives with husband and daughter      Spiritual Beliefs: Jewish      Lifestyle:  regular, aerobic and orange theory; diet healthy       Update: uses peloton 5x a week      Right handed      Caffeine: 1 cup/day   Social Determinants of Corporate investment banker Strain: Low Risk    Difficulty of Paying Living Expenses: Not hard at all  Food Insecurity: No Food Insecurity   Worried About Programme researcher, broadcasting/film/video in the Last Year: Never true   Ran Out of Food in the Last Year: Never true  Transportation Needs: No Transportation Needs   Lack of Transportation (Medical): No   Lack of Transportation (Non-Medical): No  Physical Activity: Insufficiently Active   Days of Exercise per Week: 4 days   Minutes of Exercise per Session: 30 min   Stress: No Stress Concern Present   Feeling of Stress : Only a little  Social Connections: Press photographer of Communication with Friends and Family: More than three times a week   Frequency of Social Gatherings with Friends and Family: More than three times a week   Attends Religious Services: 1 to 4 times per year   Active Member of [employer] or Organizations: Yes   Attends Engineer, structural: More than 4 times per year   Marital Status: Married  Catering manager Violence: Not  on file      PHYSICAL EXAM Generalized: Well developed, in no acute distress   Neurological examination  Mentation: Alert oriented to time, place, history taking. Follows all commands speech and language fluent Cranial nerve II-XII:Extraocular movements were full. Facial symmetry noted. Head turning and shoulder shrug  were normal and symmetric. Reflexes: UTA  DIAGNOSTIC DATA (LABS, IMAGING, TESTING) - I reviewed patient records, labs, notes, testing and imaging myself where available.  Lab Results  Component Value Date   WBC 5.8 02/21/2021   HGB 14.5 02/21/2021   HCT 43.7 02/21/2021   MCV 91.0 02/21/2021   PLT 313 02/21/2021      Component Value Date/Time   NA 139 02/21/2021 1614   K 4.2 02/21/2021 1614   CL 103 02/21/2021 1614   CO2 25 02/21/2021 1614   GLUCOSE 85 02/21/2021 1614   BUN 10 02/21/2021 1614   CREATININE 0.73 02/21/2021 1614   CALCIUM 9.6 02/21/2021 1614   PROT 7.6 02/21/2021 1614   ALBUMIN 3.4 (L) 10/02/2020 1553   AST 34 (H) 02/21/2021 1614   ALT 57 (H) 02/21/2021 1614   ALKPHOS 113 10/02/2020 1553   BILITOT 0.7 02/21/2021 1614   GFRNONAA >60 10/02/2020 1553   GFRNONAA 103 07/29/2019 1652   GFRAA 119 07/29/2019 1652    Lab Results  Component Value Date   VITAMINB12 531 01/18/2019   Lab Results  Component Value Date   TSH 0.81 01/18/2019      ASSESSMENT AND PLAN 31 y.o. year old female  has a past medical history of Allergic rhinitis,  Anxiety, Autoimmune disorder (HCC), COVID (04/05/2020), Frequent headaches, IBS (irritable bowel syndrome), Migraine, and PCOS (polycystic ovarian syndrome). here with:  1.  Migraine headaches  Continue Botox and Ajovy for preventative therapy.  Botox scheduled for March 30 Discussed the use of triptans to treat acute migraine.  She will try Amerge 2.5 mg to be taken at the onset of a migraine can repeat in 4 hours if needed.  Encouraged patient to try not to take the medication more than 2 days in a row to avoid rebound headaches Can continue [name] as an abortive therapy as well Advised patient that she can also take Amerge with ibuprofen or naproxen and that may also be beneficial for her headaches.  However the patient should use caution as her liver enzymes were slightly elevated.  Blood work will be rechecked next month with her rheumatologist   I spent 30 minutes of face-to-face and non-face-to-face time with patient.  This included previsit chart review, lab review, study review, order entry, electronic health record documentation, patient education.    Butch Penny, MSN, NP-C 05/29/2021, 10:59 AM [facility] [address] [place], [place] [address] [phone]

## 2021-05-29 NOTE — Patient Instructions (Signed)
-   Try taking Amerge 2.5 mg at the onset of a migraine can be repeated in 4 hours if needed.  Please avoid taking this medication 2 days in a row to prevent rebound headaches. ?

## 2021-06-06 NOTE — Progress Notes (Signed)
? ?Office Visit Note ? ?Patient: Jennifer Vincent             ?Date of Birth: Jun 12, 1990           ?MRN: [id number]             ?PCP: Wynn Banker, MD ?Referring: Wynn Banker, MD ?Visit Date: 06/20/2021 ?Occupation: @GUAROCC @ ? ?Subjective:  ?Facial rash  ? ?History of Present Illness: Kassandra Meriweather is a 31 y.o. female with history of autoimmune disease.  She is taking plaquenil 200 mg 1 tablet by mouth twice daily.  She is tolerating Plaquenil without any side effects and has not missed any doses recently.  Patient reports that in January she was diagnosed with shingles followed by COVID-19.  Her level of fatigue has gradually started to improve since the infections.  She states that overall her joint pain has been tolerable.  She denies any joint swelling at this time.  She is not experiencing shortness of breath, pleuritic chest pain, or palpitations.  She continues to have ongoing sicca symptoms which have been slightly worse with seasonal allergies.  She has not had any oral or nasal ulcerations.  She denies any symptoms of Raynaud's.  She has had less hair loss recently.  She states that the facial rashes had been more frequent and more prominent recently.  She has been having to wear more make-up to cover up the rash.  She has not seen a dermatologist yet. ? ? ? ? ?Activities of Daily Living:  ?Patient reports morning stiffness for 0  noone .   ?Patient Reports nocturnal pain.  ?Difficulty dressing/grooming: Denies ?Difficulty climbing stairs: Denies ?Difficulty getting out of chair: Denies ?Difficulty using hands for taps, buttons, cutlery, and/or writing: Reports ? ?Review of Systems  ?Constitutional:  Positive for fatigue.  ?HENT:  Positive for mouth dryness. Negative for mouth sores and nose dryness.   ?Eyes:  Positive for dryness. Negative for pain and visual disturbance.  ?Respiratory:  Negative for cough, hemoptysis, shortness of breath and difficulty breathing.   ?Cardiovascular:  Positive for  swelling in legs/feet. Negative for chest pain, palpitations and hypertension.  ?Gastrointestinal:  Positive for constipation. Negative for blood in stool and diarrhea.  ?Endocrine: Positive for increased urination.  ?Genitourinary:  Negative for difficulty urinating and painful urination.  ?Musculoskeletal:  Positive for joint pain, joint pain and joint swelling. Negative for myalgias, muscle weakness, morning stiffness, muscle tenderness and myalgias.  ?Skin:  Positive for rash. Negative for color change, pallor, hair loss, nodules/bumps, skin tightness, ulcers and sensitivity to sunlight.  ?Allergic/Immunologic: Positive for susceptible to infections.  ?Neurological:  Negative for dizziness, numbness, headaches and weakness.  ?Hematological:  Negative for bruising/bleeding tendency and swollen glands.  ?Psychiatric/Behavioral:  Negative for depressed mood and sleep disturbance. The patient is not nervous/anxious.   ? ?PMFS History:  ?Patient Active Problem List  ? Diagnosis Date Noted  ? Antiphospholipid antibody syndrome (HCC) 10/02/2020  ? Chronic migraine without aura, with intractable migraine, so stated, with status migrainosus 06/01/2019  ? Pregnant 07/03/2018  ? Generalized anxiety disorder 01/11/2018  ? Panic disorder 10/04/2016  ? Anxiety 10/04/2016  ?  ?Past Medical History:  ?Diagnosis Date  ? Allergic rhinitis   ? Anxiety   ? Autoimmune disorder (HCC)   ? nonspecified  ? COVID 04/05/2020  ? Frequent headaches   ? IBS (irritable bowel syndrome)   ? Migraine   ? PCOS (polycystic ovarian syndrome)   ?  ?Family History  ?Problem  Relation Age of Onset  ? Depression Mother   ? Eating disorder Mother   ? Arthritis Father   ? Crohn's disease Father   ? Ulcerative colitis Father   ? Arthritis Brother   ? Alcohol abuse Brother   ? Mental illness Brother   ? Depression Brother   ? Anxiety disorder Brother   ? Crohn's disease Brother   ? Breast cancer Maternal Grandmother   ? Mental illness Maternal Grandfather    ? Anxiety disorder Maternal Grandfather   ? OCD Maternal Grandfather   ? Heart disease Paternal Grandfather   ? Diabetes Paternal Grandfather   ? Heart attack Paternal Grandfather   ? Healthy Daughter   ? Healthy Son   ? ?Past Surgical History:  ?Procedure Laterality Date  ? TONSILLECTOMY    ? ?Social History  ? ?Social History Narrative  ? 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   ? Home Situation: lives with husband and daughter  ?   ? Spiritual Beliefs: Jewish  ?   ? Lifestyle:  regular, aerobic and orange theory; diet healthy   ?   ? Update: uses peloton 5x a week  ?   ? Right handed  ?   ? Caffeine: 1 cup/day  ? ?Immunization History  ?Administered Date(s) Administered  ? Influenza-Unspecified 12/16/2016, 12/25/2017, 12/16/2018, 01/16/2021  ? PFIZER(Purple Top)SARS-COV-2 Vaccination 05/13/2019, 06/02/2019, 02/16/2020  ? PPD Test 10/13/2014  ?  ? ?Objective: ?Vital Signs: BP 97/68 (BP Location: Left Arm, Patient Position: Sitting, Cuff Size: Normal)   Pulse (!) 101   Resp 14   Ht 5\' 8"  (1.727 m)   Wt 226 lb (102.5 kg)   BMI 34.36 kg/m?   ? ?Physical Exam ?Vitals and nursing note reviewed.  ?Constitutional:   ?   Appearance: She is well-developed.  ?HENT:  ?   Head: Normocephalic and atraumatic.  ?Eyes:  ?   Conjunctiva/sclera: Conjunctivae normal.  ?Cardiovascular:  ?   Rate and Rhythm: Normal rate and regular rhythm.  ?   Heart sounds: Normal heart sounds.  ?Pulmonary:  ?   Effort: Pulmonary effort is normal.  ?   Comments: Faint wheezing  ?Abdominal:  ?   General: Bowel sounds are normal.  ?   Palpations: Abdomen is soft.  ?Musculoskeletal:  ?   Cervical back: Normal range of motion.  ?Skin: ?   General: Skin is warm and dry.  ?   Capillary Refill: Capillary refill takes less than 2 seconds.  ?   Comments: Facial erythema   ?Neurological:  ?   Mental Status: She is alert and oriented to person, place, and time.  ?Psychiatric:     ?   Behavior: Behavior normal.  ?  ? ?Musculoskeletal Exam: C-spine,  thoracic spine, and lumbar spine good ROM.  Shoulder joints, elbow joints, wrist joints, MCPs, PIPs, and DIPs good ROM with no synovitis.  Tenderness over the right 3rd and 4th PIP joint.  Complete fist formation bilaterally.  Hip joints, knee joints, and ankle joints have good ROM.  Tenderness over both ankle joints with no warmth or effusion noted.  No tenderness over MTP joints.  No tenderness over trochanteric bursa bilaterally.  ? ?CDAI Exam: ?CDAI Score: -- ?Patient Global: --; Provider Global: -- ?Swollen: --; Tender: -- ?Joint Exam 06/20/2021  ? ?No joint exam has been documented for this visit  ? ?There is currently no information documented on the homunculus. Go to the Rheumatology activity and complete the homunculus joint  exam. ? ?Investigation: ?No additional findings. ? ?Imaging: ?No results found. ? ?Recent Labs: ?Lab Results  ?Component Value Date  ? WBC 5.8 02/21/2021  ? HGB 14.5 02/21/2021  ? PLT 313 02/21/2021  ? NA 139 02/21/2021  ? K 4.2 02/21/2021  ? CL 103 02/21/2021  ? CO2 25 02/21/2021  ? GLUCOSE 85 02/21/2021  ? BUN 10 02/21/2021  ? CREATININE 0.73 02/21/2021  ? BILITOT 0.7 02/21/2021  ? ALKPHOS 113 10/02/2020  ? AST 34 (H) 02/21/2021  ? ALT 57 (H) 02/21/2021  ? PROT 7.6 02/21/2021  ? ALBUMIN 3.4 (L) 10/02/2020  ? CALCIUM 9.6 02/21/2021  ? GFRAA 119 07/29/2019  ? QFTBGOLDPLUS NEGATIVE 07/29/2019  ? ? ?Speciality Comments: PLQ eye exam: 05/18/2020 normal. [facility]. Follow up in 1 year. ? ?Procedures:  ?No procedures performed ?Allergies: Lactose intolerance (gi)  ? ? ? ? ?Assessment / Plan:     ?Visit Diagnoses: Autoimmune disease (HCC) - Positive ANA, positive SCL 70, aCL 42, fatigue, inflammatory arthritis (pictures noted cell phone showed MCP, left ankle joint swelling and rash in the past): She is not exhibiting any signs or symptoms of active disease at this time.  She is clinically doing well taking Plaquenil 200 mg 1 tablet by mouth twice daily.  She continues to experience  intermittent arthralgias and fatigue.  She had no synovitis on examination today.  She has been noticing more frequent and prominent facial rashes.  She plans on following up with dermatology for further evaluation

## 2021-06-14 ENCOUNTER — Ambulatory Visit (INDEPENDENT_AMBULATORY_CARE_PROVIDER_SITE_OTHER): Payer: BC Managed Care – PPO | Admitting: Neurology

## 2021-06-14 DIAGNOSIS — G43711 Chronic migraine without aura, intractable, with status migrainosus: Secondary | ICD-10-CM | POA: Diagnosis not present

## 2021-06-14 NOTE — Progress Notes (Signed)
Botox- 200 units x 1 vial ?Lot: [id number] ?Expiration: 05/2023 ?NDC: [phone] ? ?Bacteriostatic 0.9% Sodium Chloride- 55mL total ?Lot: [id number] ?Expiration: 10/17/2022 ?NDC: 1308-6578-46 ? ?Dx: N62.952 ?Sample ?

## 2021-06-14 NOTE — Progress Notes (Signed)
Consent Form ?Botulism Toxin Injection For Chronic Migraine ? ?Elyxyb - elevated creatinine ?A triptan - amerge helps ?Schedule back in 8 weeks ? ?BILL FOR TIME USED SAMPLES ?06/14/2021: stable, doin ggreat ?04/18/2021: Nurtec did nothing for patient.  However she has been doing well, she had COVID and we are late for her Botox appointment 15 weeks out, she has had a headache the last 3 days but nothing significant which is great considering related on her Botox appointment.  She is also due for Ajovy at the same time unfortunately.  I told her we would use samples in 8 weeks to get her on an alternating schedule with the Ajovy but that she could go home and take the Ajovy a few days early. +a.  ? ?Elyxyb caused elevated creatinine per patient, it helped but she stopped taking it due to this.  She is back to [employer]. ? ?She is on continuous birth control and has not had a period which has also helped. ? ? ?01/10/2021: Patient getting great relief, > 70% improvement in migraine and headache frequency. No longer breastfeeding. Sent in nurtec, ubrelvy stopped working. Also will prescribe a long-acting triptan to avoid reboumd and try elyxyb, try taking nurtec every day of period or ask obgyn if can go on continuous. +a.  ? ?No orders of the defined types were placed in this encounter. ? ? ? ? Medications tried: topamax, maxalt, zoloft, imitrex, zonegran, propranolol, zofran,  Amitriptyline,flexeril, ajovy, zonegran ? ?Reviewed orally with patient, additionally signature is on file: ? ?Botulism toxin has been approved by the Federal drug administration for treatment of chronic migraine. Botulism toxin does not cure chronic migraine and it may not be effective in some patients. ? ?The administration of botulism toxin is accomplished by injecting a small amount of toxin into the muscles of the neck and head. Dosage must be titrated for each individual. Any benefits resulting from botulism toxin tend to wear off after 3  months with a repeat injection required if benefit is to be maintained. Injections are usually done every 3-4 months with maximum effect peak achieved by about 2 or 3 weeks. Botulism toxin is expensive and you should be sure of what costs you will incur resulting from the injection. ? ?The side effects of botulism toxin use for chronic migraine may include: ? ? -Transient, and usually mild, facial weakness with facial injections ? -Transient, and usually mild, head or neck weakness with head/neck injections ? -Reduction or loss of forehead facial animation due to forehead muscle weakness ? -Eyelid drooping ? -Dry eye ? -Pain at the site of injection or bruising at the site of injection ? -Double vision ? -Potential unknown long term risks ? ?Contraindications: You should not have Botox if you are pregnant, nursing, allergic to albumin, have an infection, skin condition, or muscle weakness at the site of the injection, or have myasthenia gravis, Lambert-Eaton syndrome, or ALS. ? ?It is also possible that as with any injection, there may be an allergic reaction or no effect from the medication. Reduced effectiveness after repeated injections is sometimes seen and rarely infection at the injection site may occur. All care will be taken to prevent these side effects. If therapy is given over a long time, atrophy and wasting in the muscle injected may occur. Occasionally the patient's become refractory to treatment because they develop antibodies to the toxin. In this event, therapy needs to be modified. ? ?I have read the above information and consent to the  administration of botulism toxin. ? ? ? ?BOTOX PROCEDURE NOTE FOR MIGRAINE HEADACHE ? ? ? ?Contraindications and precautions discussed with patient(above). Aseptic procedure was observed and patient tolerated procedure. Procedure performed by Dr. Georgia Dom ? ?The condition has existed for more than 6 months, and pt does not have a diagnosis of ALS, Myasthenia  Gravis or Lambert-Eaton Syndrome.  Risks and benefits of injections discussed and pt agrees to proceed with the procedure.  Written consent obtained ? ?These injections are medically necessary. Pt  receives good benefits from these injections. These injections do not cause sedations or hallucinations which the oral therapies may cause. ? ?Description of procedure: ? ?The patient was placed in a sitting position. The standard protocol was used for Botox as follows, with 5 units of Botox injected at each site: ? ? ?-Procerus muscle, midline injection ? ?-Corrugator muscle, bilateral injection ? ?-Frontalis muscle, bilateral injection, with 2 sites each side, medial injection was performed in the upper one third of the frontalis muscle, in the region vertical from the medial inferior edge of the superior orbital rim. The lateral injection was again in the upper one third of the forehead vertically above the lateral limbus of the cornea, 1.5 cm lateral to the medial injection site. ? ?-Temporalis muscle injection, 4 sites, bilaterally. The first injection was 3 cm above the tragus of the ear, second injection site was 1.5 cm to 3 cm up from the first injection site in line with the tragus of the ear. The third injection site was 1.5-3 cm forward between the first 2 injection sites. The fourth injection site was 1.5 cm posterior to the second injection site.  ? ?-Occipitalis muscle injection, 3 sites, bilaterally. The first injection was done one half way between the occipital protuberance and the tip of the mastoid process behind the ear. The second injection site was done lateral and superior to the first, 1 fingerbreadth from the first injection. The third injection site was 1 fingerbreadth superiorly and medially from the first injection site. ? ?-Cervical paraspinal muscle injection, 2 sites, bilateral knee first injection site was 1 cm from the midline of the cervical spine, 3 cm inferior to the lower border of the  occipital protuberance. The second injection site was 1.5 cm superiorly and laterally to the first injection site. ? ?-Trapezius muscle injection was performed at 3 sites, bilaterally. The first injection site was in the upper trapezius muscle halfway between the inflection point of the neck, and the acromion. The second injection site was one half way between the acromion and the first injection site. The third injection was done between the first injection site and the inflection point of the neck. ? ? ?Will return for repeat injection in 3 months. ? ? ?155 units of Botox was used, 45U  Botox not injected was wasted. The patient tolerated the procedure well, there were no complications of the above procedure. ? ?I spent 30 minutes of face-to-face and non-face-to-face time with patient on the No diagnosis found. diagnosis.  This included previsit chart review, lab review, study review, order entry, electronic health record documentation, patient education on the different diagnostic and therapeutic options, counseling and coordination of care, risks and benefits of management, compliance, or risk factor reduction ? ?

## 2021-06-18 ENCOUNTER — Encounter: Payer: Self-pay | Admitting: Neurology

## 2021-06-20 ENCOUNTER — Ambulatory Visit (INDEPENDENT_AMBULATORY_CARE_PROVIDER_SITE_OTHER): Payer: BC Managed Care – PPO | Admitting: Physician Assistant

## 2021-06-20 ENCOUNTER — Encounter: Payer: Self-pay | Admitting: Physician Assistant

## 2021-06-20 VITALS — BP 97/68 | HR 101 | Resp 14 | Ht 68.0 in | Wt 226.0 lb

## 2021-06-20 DIAGNOSIS — M25521 Pain in right elbow: Secondary | ICD-10-CM

## 2021-06-20 DIAGNOSIS — M79641 Pain in right hand: Secondary | ICD-10-CM | POA: Diagnosis not present

## 2021-06-20 DIAGNOSIS — M2241 Chondromalacia patellae, right knee: Secondary | ICD-10-CM

## 2021-06-20 DIAGNOSIS — Z79899 Other long term (current) drug therapy: Secondary | ICD-10-CM

## 2021-06-20 DIAGNOSIS — Z8669 Personal history of other diseases of the nervous system and sense organs: Secondary | ICD-10-CM

## 2021-06-20 DIAGNOSIS — M25572 Pain in left ankle and joints of left foot: Secondary | ICD-10-CM

## 2021-06-20 DIAGNOSIS — Z8616 Personal history of COVID-19: Secondary | ICD-10-CM

## 2021-06-20 DIAGNOSIS — F411 Generalized anxiety disorder: Secondary | ICD-10-CM

## 2021-06-20 DIAGNOSIS — Z8379 Family history of other diseases of the digestive system: Secondary | ICD-10-CM

## 2021-06-20 DIAGNOSIS — Z8619 Personal history of other infectious and parasitic diseases: Secondary | ICD-10-CM

## 2021-06-20 DIAGNOSIS — F410 Panic disorder [episodic paroxysmal anxiety] without agoraphobia: Secondary | ICD-10-CM

## 2021-06-20 DIAGNOSIS — Z8719 Personal history of other diseases of the digestive system: Secondary | ICD-10-CM

## 2021-06-20 DIAGNOSIS — M359 Systemic involvement of connective tissue, unspecified: Secondary | ICD-10-CM

## 2021-06-20 DIAGNOSIS — E559 Vitamin D deficiency, unspecified: Secondary | ICD-10-CM

## 2021-06-20 DIAGNOSIS — M2242 Chondromalacia patellae, left knee: Secondary | ICD-10-CM

## 2021-06-20 DIAGNOSIS — M79642 Pain in left hand: Secondary | ICD-10-CM

## 2021-06-20 DIAGNOSIS — M25522 Pain in left elbow: Secondary | ICD-10-CM

## 2021-06-20 DIAGNOSIS — R760 Raised antibody titer: Secondary | ICD-10-CM

## 2021-06-21 DIAGNOSIS — F4322 Adjustment disorder with anxiety: Secondary | ICD-10-CM | POA: Diagnosis not present

## 2021-06-21 LAB — CBC WITH DIFFERENTIAL/PLATELET
Absolute Monocytes: 261 cells/uL (ref 200–950)
Basophils Absolute: 59 cells/uL (ref 0–200)
Basophils Relative: 1.3 %
Eosinophils Absolute: 59 cells/uL (ref 15–500)
Eosinophils Relative: 1.3 %
HCT: 46.5 % — ABNORMAL HIGH (ref 35.0–45.0)
Hemoglobin: 15.4 g/dL (ref 11.7–15.5)
Lymphs Abs: 1562 cells/uL (ref 850–3900)
MCH: 31.4 pg (ref 27.0–33.0)
MCHC: 33.1 g/dL (ref 32.0–36.0)
MCV: 94.7 fL (ref 80.0–100.0)
MPV: 12.7 fL — ABNORMAL HIGH (ref 7.5–12.5)
Monocytes Relative: 5.8 %
Neutro Abs: 2561 cells/uL (ref 1500–7800)
Neutrophils Relative %: 56.9 %
Platelets: 258 10*3/uL (ref 140–400)
RBC: 4.91 10*6/uL (ref 3.80–5.10)
RDW: 12.7 % (ref 11.0–15.0)
Total Lymphocyte: 34.7 %
WBC: 4.5 10*3/uL (ref 3.8–10.8)

## 2021-06-21 LAB — COMPLETE METABOLIC PANEL WITH GFR
AG Ratio: 1.6 (calc) (ref 1.0–2.5)
ALT: 25 U/L (ref 6–29)
AST: 20 U/L (ref 10–30)
Albumin: 4.5 g/dL (ref 3.6–5.1)
Alkaline phosphatase (APISO): 30 U/L — ABNORMAL LOW (ref 31–125)
BUN: 9 mg/dL (ref 7–25)
CO2: 29 mmol/L (ref 20–32)
Calcium: 9.6 mg/dL (ref 8.6–10.2)
Chloride: 105 mmol/L (ref 98–110)
Creat: 0.85 mg/dL (ref 0.50–0.97)
Globulin: 2.8 g/dL (calc) (ref 1.9–3.7)
Glucose, Bld: 86 mg/dL (ref 65–99)
Potassium: 4.2 mmol/L (ref 3.5–5.3)
Sodium: 140 mmol/L (ref 135–146)
Total Bilirubin: 0.5 mg/dL (ref 0.2–1.2)
Total Protein: 7.3 g/dL (ref 6.1–8.1)
eGFR: 94 mL/min/{1.73_m2} (ref >=60)

## 2021-06-21 LAB — URINALYSIS, ROUTINE W REFLEX MICROSCOPIC
Bilirubin Urine: NEGATIVE
Glucose, UA: NEGATIVE
Hgb urine dipstick: NEGATIVE
Ketones, ur: NEGATIVE
Leukocytes,Ua: NEGATIVE
Nitrite: NEGATIVE
Protein, ur: NEGATIVE
Specific Gravity, Urine: 1.022 (ref 1.001–1.035)
pH: 5.5 (ref 5.0–8.0)

## 2021-06-21 LAB — C3 AND C4
C3 Complement: 132 mg/dL (ref 83–193)
C4 Complement: 27 mg/dL (ref 15–57)

## 2021-06-21 LAB — SEDIMENTATION RATE: Sed Rate: 2 mm/h (ref 0–20)

## 2021-06-21 LAB — ANTI-DNA ANTIBODY, DOUBLE-STRANDED: ds DNA Ab: 1 IU/mL

## 2021-06-21 NOTE — Progress Notes (Signed)
CBC stable.  Alk phos is borderline low.  Rest of CMP WNL.  LFTS have returned to WNL.   ?ESR WNL.  Complements WNL.  ?UA normal.

## 2021-06-25 NOTE — Progress Notes (Signed)
dsDNA is negative. Labs are not consistent with a flare at this time.

## 2021-06-30 ENCOUNTER — Encounter: Payer: Self-pay | Admitting: Family Medicine

## 2021-06-30 DIAGNOSIS — E6609 Other obesity due to excess calories: Secondary | ICD-10-CM

## 2021-07-06 ENCOUNTER — Ambulatory Visit: Payer: BC Managed Care – PPO | Admitting: Family Medicine

## 2021-07-06 MED ORDER — WEGOVY 0.25 MG/0.5ML ~~LOC~~ SOAJ: 0.2500 mg | SUBCUTANEOUS | 2 refills | Status: DC

## 2021-07-07 ENCOUNTER — Other Ambulatory Visit: Payer: Self-pay | Admitting: Physician Assistant

## 2021-07-07 DIAGNOSIS — M359 Systemic involvement of connective tissue, unspecified: Secondary | ICD-10-CM

## 2021-07-09 NOTE — Telephone Encounter (Signed)
Please advise the patient to schedule an updated plaquenil eye examination.

## 2021-07-09 NOTE — Telephone Encounter (Signed)
Left message to advise patient she is due to update her PLQ eye exam.  

## 2021-07-09 NOTE — Telephone Encounter (Signed)
Next Visit: 11/29/2021 ? ?Last Visit: 06/20/2021 ? ?Labs: 06/20/2021 CBC stable.  Alk phos is borderline low.  Rest of CMP WNL.  LFTS have returned to WNL.  ? ?Eye exam:  05/18/2020 normal.   ? ?Current Dose per office note 06/20/2021: Plaquenil 200 mg 1 tablet by mouth twice daily ? ?LI:DCVUDTHYHO disease ? ?Last Fill: 04/09/2021 ? ?Okay to refill Plaquenil?  ?

## 2021-07-11 ENCOUNTER — Ambulatory Visit: Payer: BC Managed Care – PPO | Admitting: Neurology

## 2021-07-11 NOTE — Telephone Encounter (Signed)
Fax received stating the request was denied and this was given to PCP for review. ?

## 2021-07-12 ENCOUNTER — Encounter: Payer: Self-pay | Admitting: Neurology

## 2021-07-12 ENCOUNTER — Other Ambulatory Visit: Payer: Self-pay | Admitting: *Deleted

## 2021-07-12 ENCOUNTER — Ambulatory Visit: Payer: BC Managed Care – PPO

## 2021-07-12 DIAGNOSIS — F4322 Adjustment disorder with anxiety: Secondary | ICD-10-CM | POA: Diagnosis not present

## 2021-07-12 DIAGNOSIS — G43009 Migraine without aura, not intractable, without status migrainosus: Secondary | ICD-10-CM

## 2021-07-12 MED ORDER — KETOROLAC TROMETHAMINE 60 MG/2ML IM SOLN: 60.0000 mg | Freq: Once | INTRAMUSCULAR | Status: AC

## 2021-07-12 NOTE — Progress Notes (Signed)
Per v.o. Dr Lucia Gaskins, Toradol 60 mg IM x 1 ordered for her afternoon injection today for migraine.  ?

## 2021-07-12 NOTE — Telephone Encounter (Signed)
No opening for nerve block today with Dr Lucia Gaskins. Per Dr Lucia Gaskins, we can do Toradol injection. Alternative is urgent care.  ?

## 2021-07-16 ENCOUNTER — Encounter: Payer: Self-pay | Admitting: Neurology

## 2021-07-17 ENCOUNTER — Telehealth: Payer: Self-pay | Admitting: Adult Health

## 2021-07-17 NOTE — Telephone Encounter (Signed)
At 10:13 pt left vm asking if Dr Jaynee Eagles has any samples of Ubrogepant (UBRELVY) 100 MG TABS, due to pt not being able to fill her prescription until Sunday.  Pt stated she's been having a rough go at it and does not want to run out before being able to fill the Rx, please call. ?

## 2021-07-17 NOTE — Telephone Encounter (Signed)
Sent pt a [facility] geologist. Per Dr Lucia Gaskins, pt needs an appt with NP to discuss. Pt had also messaged about switching Ajovy medication to something else.  ?

## 2021-07-18 ENCOUNTER — Telehealth: Payer: Self-pay | Admitting: Adult Health

## 2021-07-18 ENCOUNTER — Encounter: Payer: Self-pay | Admitting: Adult Health

## 2021-07-18 ENCOUNTER — Ambulatory Visit (INDEPENDENT_AMBULATORY_CARE_PROVIDER_SITE_OTHER): Payer: BC Managed Care – PPO | Admitting: Adult Health

## 2021-07-18 VITALS — BP 121/88 | HR 110 | Ht 68.0 in | Wt 227.2 lb

## 2021-07-18 DIAGNOSIS — G43009 Migraine without aura, not intractable, without status migrainosus: Secondary | ICD-10-CM

## 2021-07-18 DIAGNOSIS — G43711 Chronic migraine without aura, intractable, with status migrainosus: Secondary | ICD-10-CM | POA: Diagnosis not present

## 2021-07-18 MED ORDER — KETOROLAC TROMETHAMINE 60 MG/2ML IM SOLN
30.0000 mg | Freq: Once | INTRAMUSCULAR | Status: AC
  Administered 2021-07-18: 30 mg via INTRAMUSCULAR

## 2021-07-18 MED ORDER — UBRELVY 100 MG PO TABS: ORAL_TABLET | ORAL | 0 refills | Status: DC

## 2021-07-18 MED ORDER — EMGALITY 120 MG/ML ~~LOC~~ SOAJ: 120.0000 mg | SUBCUTANEOUS | 11 refills | Status: DC

## 2021-07-18 NOTE — Progress Notes (Signed)
Order for toradol 30mg  IM for pt with migraine. Under aseptic technique toradol 30mg /76ml given IM to R deltoid.  Tolerated well, bandaid applied.  Pt c/o soreness at site when leaving.   30mg  / 87ml wasted, witnessed by 0m, RN. ?

## 2021-07-18 NOTE — Telephone Encounter (Signed)
Referral for Ophthalmology sent to [facility] [phone]. ?

## 2021-07-18 NOTE — Progress Notes (Signed)
? ? ?PATIENT: Jennifer Vincent ?DOB: 06/16/1990 ? ?REASON FOR VISIT: follow up ?HISTORY FROM: patient ?PRIMARY NEUROLOGIST: Dr. Lucia Gaskins ? ?HISTORY OF PRESENT ILLNESS: ?Today 07/18/21: ?Jennifer Vincent is a 31 year old female with a history of migraine headaches.  She returns today for follow-up.  She states that since her last pregnancy Ajovy has not been working well for her.  She has greater than 20 headache days a month. ?Has had a headache since last Tuesday- able to work through it because she has not other choice.  We offered a Toradol shot last week but she was unable to get childcare to come in.  Amerge didn't help she gets more relief from [place]. What helps the most is taking ubrelvy followed by elxyb. Rheumatologiist has cleared her to take this. Last took Elxyb Monday.  Reports that she typically wakes up with a dull headache.  The headache typically gets worse as the day progresses.  Denies any changes with her vision.  Denies muffled hearing.  She does have an ophthalmologist that she sees.  Taking Ajovy monthly and Botox every 12 weeks.  ? ? ? ?HISTORY Jennifer Vincent is a 31 year old female with a history of migraine headaches.  She returns today for virtual visit.  The patient reports that she has been having a lot of breakthrough headaches.  This originally started after having COVID.  She also had a breakthrough menstrual cycle and in the past her menstrual cycles have been a big trigger for her migraines.  Also the barometric pressure has been a trigger.  She did a Medrol Dosepak last week and did not have a headache Thursday Friday Saturday or Sunday after taking the medication.  Yesterday she developed a migraine and used her [name] this morning she also woke up with a headache and is already taking [place].  She states that the [place] does not resolve her headaches but typically just takes the edge off.  She has tried triptans in the past but she felt that it caused rebound headaches however this was prior to  her being on any preventative medication.  She has tried Nurtec in the past but did not find it beneficial.  She tried Elyxyb and felt that it was helpful however her rheumatologist instructed her not to take NSAIDs due to elevated liver enzymes.  She goes back next month for repeat blood work. ?  ? ?REVIEW OF SYSTEMS: Out of a complete 14 system review of symptoms, the patient complains only of the following symptoms, and all other reviewed systems are negative. ? ?ALLERGIES: ?Allergies  ?Allergen Reactions  ? Lactose Intolerance (Gi)   ?  GI upset  ? ? ?HOME MEDICATIONS: ?Outpatient Medications Prior to Visit  ?Medication Sig Dispense Refill  ? busPIRone (BUSPAR) 15 MG tablet TAKE 2 TABLETS BY MOUTH TWICE A DAY 360 tablet 0  ? Fremanezumab-vfrm (AJOVY) 225 MG/1.5ML SOAJ Inject 225 mg into the skin every 30 (thirty) days. 1.5 mL 5  ? hydroxychloroquine (PLAQUENIL) 200 MG tablet TAKE 1 TABLET BY MOUTH TWICE A DAY 180 tablet 0  ? ibuprofen (ADVIL) 200 MG tablet Take 200 mg by mouth as needed.    ? JUNEL FE 1/20 1-20 MG-MCG tablet Take 1 tablet by mouth daily.    ? methylPREDNISolone (MEDROL DOSEPAK) 4 MG TBPK tablet Take pills daily all together with food. Take the first dose (6 pills) as soon as possible. Take the rest each morning. For 6 days total 6-5-4-3-2-1. (Patient not taking: Reported on 06/20/2021) 21 tablet  1  ? Prenatal Vit-Fe Fumarate-FA (PRENATAL MULTIVITAMIN) TABS tablet Take 1 tablet by mouth daily at 12 noon.    ? Semaglutide-Weight Management (WEGOVY) 0.25 MG/0.5ML SOAJ Inject 0.25 mg into the skin once a week. 2 mL 2  ? sertraline (ZOLOFT) 100 MG tablet Take 1 tablet (100 mg total) by mouth daily. 90 tablet 2  ? Ubrogepant (UBRELVY) 100 MG TABS Take 1 tablet onset migraine, may take another 2 hours later if needed. 16 tablet 5  ? metroNIDAZOLE (METROGEL) 1 % gel Apply topically daily. (Patient not taking: Reported on 06/20/2021) 45 g 2  ? naratriptan (AMERGE) 2.5 MG tablet Take 1 tablet (2.5 mg total)  by mouth as needed for migraine. Take one (1) tablet at onset of headache; if returns or does not resolve, may repeat after 4 hours; do not exceed five (5) mg in 24 hours. Must last 28 days (Patient not taking: Reported on 07/18/2021) 10 tablet 3  ? Norethin-Eth Estrad-Fe Biphas (LO LOESTRIN FE PO) Take by mouth. (Patient not taking: Reported on 06/20/2021)    ? ?No facility-administered medications prior to visit.  ? ? ?PAST MEDICAL HISTORY: ?Past Medical History:  ?Diagnosis Date  ? Allergic rhinitis   ? Anxiety   ? Autoimmune disorder (HCC)   ? nonspecified  ? COVID 04/05/2020  ? Frequent headaches   ? IBS (irritable bowel syndrome)   ? Migraine   ? PCOS (polycystic ovarian syndrome)   ? ? ?PAST SURGICAL HISTORY: ?Past Surgical History:  ?Procedure Laterality Date  ? TONSILLECTOMY    ? ? ?FAMILY HISTORY: ?Family History  ?Problem Relation Age of Onset  ? Depression Mother   ? Eating disorder Mother   ? Arthritis Father   ? Crohn's disease Father   ? Ulcerative colitis Father   ? Arthritis Brother   ? Alcohol abuse Brother   ? Mental illness Brother   ? Depression Brother   ? Anxiety disorder Brother   ? Crohn's disease Brother   ? Migraines Maternal Uncle   ? Breast cancer Maternal Grandmother   ? Mental illness Maternal Grandfather   ? Anxiety disorder Maternal Grandfather   ? OCD Maternal Grandfather   ? Heart disease Paternal Grandfather   ? Diabetes Paternal Grandfather   ? Heart attack Paternal Grandfather   ? Healthy Daughter   ? Healthy Son   ? ? ?SOCIAL HISTORY: ?Social History  ? ?Socioeconomic History  ? Marital status: Married  ?  Spouse name: Not on file  ? Number of children: 1  ? Years of education: Not on file  ? Highest education level: Master's degree (e.g., MA, MS, MEng, MEd, MSW, MBA)  ?Occupational History  ? Not on file  ?Tobacco Use  ? Smoking status: Never  ? Smokeless tobacco: Never  ?Vaping Use  ? Vaping Use: Never used  ?Substance and Sexual Activity  ? Alcohol use: Not Currently  ? Drug  use: Never  ? Sexual activity: Yes  ?  Birth control/protection: None  ?Other Topics Concern  ? Not on file  ?Social History Narrative  ? 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   ? Home Situation: lives with husband and daughter  ?   ? Spiritual Beliefs: Jewish  ?   ? Lifestyle:  regular, aerobic and orange theory; diet healthy   ?   ? Update: uses peloton 5x a week  ?   ? Right handed  ?   ? Caffeine: 1 cup/day  ? ?Social  Determinants of Health  ? ?Financial Resource Strain: Low Risk   ? Difficulty of Paying Living Expenses: Not hard at all  ?Food Insecurity: No Food Insecurity  ? Worried About Programme researcher, broadcasting/film/videounning Out of Food in the Last Year: Never true  ? Ran Out of Food in the Last Year: Never true  ?Transportation Needs: No Transportation Needs  ? Lack of Transportation (Medical): No  ? Lack of Transportation (Non-Medical): No  ?Physical Activity: Insufficiently Active  ? Days of Exercise per Week: 4 days  ? Minutes of Exercise per Session: 30 min  ?Stress: No Stress Concern Present  ? Feeling of Stress : Only a little  ?Social Connections: Socially Integrated  ? Frequency of Communication with Friends and Family: More than three times a week  ? Frequency of Social Gatherings with Friends and Family: More than three times a week  ? Attends Religious Services: 1 to 4 times per year  ? Active Member of Clubs or Organizations: Yes  ? Attends BankerClub or Organization Meetings: More than 4 times per year  ? Marital Status: Married  ?Intimate Partner Violence: Not on file  ? ? ? ? ?PHYSICAL EXAM ? ?Vitals:  ? 07/18/21 0912  ?BP: 121/88  ?Pulse: (!) 110  ?Weight: 227 lb 3.2 oz (103.1 kg)  ?Height: 5\' 8"  (1.727 m)  ? ?Body mass index is 34.55 kg/m?. ? ?Generalized: Well developed, in no acute distress  ? ?Neurological examination  ?Mentation: Alert oriented to time, place, history taking. Follows all commands speech and language fluent ?Cranial nerve II-XII: Pupils were equal round reactive to light. Extraocular movements were  full, visual field were full on confrontational test. Head turning and shoulder shrug  were normal and symmetric. ?Motor: The motor testing reveals 5 over 5 strength of all 4 extremities. Good symmetric mot

## 2021-07-18 NOTE — Telephone Encounter (Signed)
Pt will be seen today.

## 2021-07-18 NOTE — Patient Instructions (Addendum)
Your Plan: ? ?Start Emgality- start that the next injection cycle ?MRI brain  ?See opthalmology for eye exam ?If your symptoms worsen or you develop new symptoms please let us know.  ? ?Thank you for coming to see [place] at [facility]. I hope we have been able to provide you high quality care today. ? ?You may receive a patient satisfaction survey over the next few weeks. We would appreciate your feedback and comments so that we may continue to improve ourselves and the health of our patients. ? ?

## 2021-07-23 ENCOUNTER — Telehealth: Payer: Self-pay | Admitting: *Deleted

## 2021-07-23 MED ORDER — EMGALITY 120 MG/ML ~~LOC~~ SOAJ: 120.0000 mg | SUBCUTANEOUS | 0 refills | Status: DC

## 2021-07-23 NOTE — Addendum Note (Signed)
Addended by: Gildardo Griffes on: 07/23/2021 04:50 PM ? ? Modules accepted: Orders ? ?

## 2021-07-23 NOTE — Telephone Encounter (Signed)
Emgality 120 mg sample Rx has been written for pt per v.o. Dr Lucia Gaskins. Copy of med list placed with medication.  ?

## 2021-07-23 NOTE — Telephone Encounter (Signed)
Rx for [employer] was written by MM NP. PA is due however unfortunately pt has BCBS and insurance will cancel Botox authorization if we proceed and insurance approves the [employer].  ?

## 2021-07-24 DIAGNOSIS — F4322 Adjustment disorder with anxiety: Secondary | ICD-10-CM | POA: Diagnosis not present

## 2021-07-26 ENCOUNTER — Telehealth: Payer: Self-pay | Admitting: Adult Health

## 2021-07-26 NOTE — Telephone Encounter (Signed)
45 mins MRI brain w/wo contrast [address] BCBS auth: [id number] exp. 07/24/21-08/22/21 scheduled at [facility] 08/21/21 at 4pm. ? ?Patient would like meds for scan please ?

## 2021-07-28 DIAGNOSIS — J029 Acute pharyngitis, unspecified: Secondary | ICD-10-CM | POA: Diagnosis not present

## 2021-08-02 MED ORDER — ALPRAZOLAM 0.25 MG PO TABS: ORAL_TABLET | ORAL | 0 refills | Status: DC

## 2021-08-02 NOTE — Addendum Note (Signed)
Addended by: Trudie Buckler on: 08/02/2021 01:33 PM   Modules accepted: Orders

## 2021-08-02 NOTE — Telephone Encounter (Signed)
Order sent. Please let patient know she must have a driver.

## 2021-08-07 DIAGNOSIS — N760 Acute vaginitis: Secondary | ICD-10-CM | POA: Diagnosis not present

## 2021-08-07 DIAGNOSIS — R300 Dysuria: Secondary | ICD-10-CM | POA: Diagnosis not present

## 2021-08-07 DIAGNOSIS — R8279 Other abnormal findings on microbiological examination of urine: Secondary | ICD-10-CM | POA: Diagnosis not present

## 2021-08-09 ENCOUNTER — Ambulatory Visit (INDEPENDENT_AMBULATORY_CARE_PROVIDER_SITE_OTHER): Payer: BC Managed Care – PPO | Admitting: Neurology

## 2021-08-09 DIAGNOSIS — F4322 Adjustment disorder with anxiety: Secondary | ICD-10-CM | POA: Diagnosis not present

## 2021-08-09 DIAGNOSIS — G43711 Chronic migraine without aura, intractable, with status migrainosus: Secondary | ICD-10-CM | POA: Diagnosis not present

## 2021-08-09 NOTE — Progress Notes (Signed)
Consent Form Botulism Toxin Injection For Chronic Migraine  Elyxyb - elevated creatinine A triptan - amerge helps Schedule back in 8 weeks  BILL FOR TIME USED TODAY SAMPLES Next will bill insurance and [id number]   08/09/2021: Botox today  I spent 20 minutes of face-to-face and non-face-to-face time with patient on the  1. Chronic migraine without aura, with intractable migraine, so stated, with status migrainosus    diagnosis.  This included previsit chart review, lab review, study review, order entry, electronic health record documentation, patient education on the different diagnostic and therapeutic options, counseling and coordination of care, risks and benefits of management, compliance, or risk factor reduction   Butch Penny 07/18/2021:  Ms. Voisin is a 31 year old female with a history of migraine headaches.  She returns today for follow-up.  She states that since her last pregnancy Ajovy has not been working well for her.  She has greater than 20 headache days a month. Has had a headache since last Tuesday- able to work through it because she has not other choice.  We offered a Toradol shot last week but she was unable to get childcare to come in.  Amerge didn't help she gets more relief from [place]. What helps the most is taking ubrelvy followed by elxyb. Rheumatologiist has cleared her to take this. Last took Elxyb Monday.  Reports that she typically wakes up with a dull headache.  The headache typically gets worse as the day progresses.  Denies any changes with her vision.  Denies muffled hearing.  She does have an ophthalmologist that she sees.  Taking Ajovy monthly and Botox every 12 weeks.   -Stop Ajovy start Emgality at the next injection cycle -Continue Ubrelvy for abortive therapy sample given today -Toradol 30 mg IM given today for acute headache -MRI of the brain with and without contrast -Advised patient to set up an appointment with her ophthalmologist to look for  papilledema.  May consider lumbar puncture in the future depending on results of MRI and ophthalmology report -Follow-up in 3 to 4 months or sooner if needed 06/14/2021: stable, doin ggreat 04/18/2021: Nurtec did nothing for patient.  However she has been doing well, she had COVID and we are late for her Botox appointment 15 weeks out, she has had a headache the last 3 days but nothing significant which is great considering related on her Botox appointment.  She is also due for Ajovy at the same time unfortunately.  I told her we would use samples in 8 weeks to get her on an alternating schedule with the Ajovy but that she could go home and take the Ajovy a few days early. +a.   Elyxyb caused elevated creatinine per patient, it helped but she stopped taking it due to this.  She is back to [place].  She is on continuous birth control and has not had a period which has also helped.   01/10/2021: Patient getting great relief, > 70% improvement in migraine and headache frequency. No longer breastfeeding. Sent in nurtec, ubrelvy stopped working. Also will prescribe a long-acting triptan to avoid reboumd and try elyxyb, try taking nurtec every day of period or ask obgyn if can go on continuous. +a.   No orders of the defined types were placed in this encounter.     Medications tried: topamax, maxalt, zoloft, imitrex, zonegran, propranolol, zofran,  Amitriptyline,flexeril, ajovy, zonegran  Reviewed orally with patient, additionally signature is on file:  Botulism toxin has been approved by the [employer] drug  administration for treatment of chronic migraine. Botulism toxin does not cure chronic migraine and it may not be effective in some patients.  The administration of botulism toxin is accomplished by injecting a small amount of toxin into the muscles of the neck and head. Dosage must be titrated for each individual. Any benefits resulting from botulism toxin tend to wear off after 3 months with a repeat  injection required if benefit is to be maintained. Injections are usually done every 3-4 months with maximum effect peak achieved by about 2 or 3 weeks. Botulism toxin is expensive and you should be sure of what costs you will incur resulting from the injection.  The side effects of botulism toxin use for chronic migraine may include:   -Transient, and usually mild, facial weakness with facial injections  -Transient, and usually mild, head or neck weakness with head/neck injections  -Reduction or loss of forehead facial animation due to forehead muscle weakness  -Eyelid drooping  -Dry eye  -Pain at the site of injection or bruising at the site of injection  -Double vision  -Potential unknown long term risks  Contraindications: You should not have Botox if you are pregnant, nursing, allergic to albumin, have an infection, skin condition, or muscle weakness at the site of the injection, or have myasthenia gravis, Lambert-Eaton syndrome, or ALS.  It is also possible that as with any injection, there may be an allergic reaction or no effect from the medication. Reduced effectiveness after repeated injections is sometimes seen and rarely infection at the injection site may occur. All care will be taken to prevent these side effects. If therapy is given over a long time, atrophy and wasting in the muscle injected may occur. Occasionally the patient's become refractory to treatment because they develop antibodies to the toxin. In this event, therapy needs to be modified.  I have read the above information and consent to the administration of botulism toxin.    BOTOX PROCEDURE NOTE FOR MIGRAINE HEADACHE    Contraindications and precautions discussed with patient(above). Aseptic procedure was observed and patient tolerated procedure. Procedure performed by Dr. Artemio Alyoni Anessa Charley  The condition has existed for more than 6 months, and pt does not have a diagnosis of ALS, Myasthenia Gravis or Lambert-Eaton  Syndrome.  Risks and benefits of injections discussed and pt agrees to proceed with the procedure.  Written consent obtained  These injections are medically necessary. Pt  receives good benefits from these injections. These injections do not cause sedations or hallucinations which the oral therapies may cause.  Description of procedure:  The patient was placed in a sitting position. The standard protocol was used for Botox as follows, with 5 units of Botox injected at each site:   -Procerus muscle, midline injection  -Corrugator muscle, bilateral injection  -Frontalis muscle, bilateral injection, with 2 sites each side, medial injection was performed in the upper one third of the frontalis muscle, in the region vertical from the medial inferior edge of the superior orbital rim. The lateral injection was again in the upper one third of the forehead vertically above the lateral limbus of the cornea, 1.5 cm lateral to the medial injection site.  -Temporalis muscle injection, 4 sites, bilaterally. The first injection was 3 cm above the tragus of the ear, second injection site was 1.5 cm to 3 cm up from the first injection site in line with the tragus of the ear. The third injection site was 1.5-3 cm forward between the first 2 injection sites.  The fourth injection site was 1.5 cm posterior to the second injection site.   -Occipitalis muscle injection, 3 sites, bilaterally. The first injection was done one half way between the occipital protuberance and the tip of the mastoid process behind the ear. The second injection site was done lateral and superior to the first, 1 fingerbreadth from the first injection. The third injection site was 1 fingerbreadth superiorly and medially from the first injection site.  -Cervical paraspinal muscle injection, 2 sites, bilateral knee first injection site was 1 cm from the midline of the cervical spine, 3 cm inferior to the lower border of the occipital protuberance.  The second injection site was 1.5 cm superiorly and laterally to the first injection site.  -Trapezius muscle injection was performed at 3 sites, bilaterally. The first injection site was in the upper trapezius muscle halfway between the inflection point of the neck, and the acromion. The second injection site was one half way between the acromion and the first injection site. The third injection was done between the first injection site and the inflection point of the neck.   Will return for repeat injection in 3 months.   155 units of Botox was used, 45U  Botox not injected was wasted. The patient tolerated the procedure well, there were no complications of the above procedure.  I spent 30 minutes of face-to-face and non-face-to-face time with patient on the  1. Chronic migraine without aura, with intractable migraine, so stated, with status migrainosus    diagnosis.  This included previsit chart review, lab review, study review, order entry, electronic health record documentation, patient education on the different diagnostic and therapeutic options, counseling and coordination of care, risks and benefits of management, compliance, or risk factor reduction

## 2021-08-09 NOTE — Progress Notes (Signed)
Botox- 200 units x 1 vial Lot: [id number] Expiration: 06/2023 NDC: 8250-5397-67  Bacteriostatic 0.9% Sodium Chloride- 37mL total Lot: [id number] Expiration: 10/17/2022 NDC: 3419-3790-24  Dx: O97.353 SAMPLES

## 2021-08-10 DIAGNOSIS — Z79899 Other long term (current) drug therapy: Secondary | ICD-10-CM | POA: Diagnosis not present

## 2021-08-20 DIAGNOSIS — Z1329 Encounter for screening for other suspected endocrine disorder: Secondary | ICD-10-CM | POA: Diagnosis not present

## 2021-08-20 DIAGNOSIS — Z130 Encounter for screening for diseases of the blood and blood-forming organs and certain disorders involving the immune mechanism: Secondary | ICD-10-CM | POA: Diagnosis not present

## 2021-08-20 DIAGNOSIS — R634 Abnormal weight loss: Secondary | ICD-10-CM | POA: Diagnosis not present

## 2021-08-20 DIAGNOSIS — Z131 Encounter for screening for diabetes mellitus: Secondary | ICD-10-CM | POA: Diagnosis not present

## 2021-08-20 DIAGNOSIS — Z6833 Body mass index (BMI) 33.0-33.9, adult: Secondary | ICD-10-CM | POA: Diagnosis not present

## 2021-08-20 DIAGNOSIS — Z13220 Encounter for screening for lipoid disorders: Secondary | ICD-10-CM | POA: Diagnosis not present

## 2021-08-21 ENCOUNTER — Ambulatory Visit (INDEPENDENT_AMBULATORY_CARE_PROVIDER_SITE_OTHER): Payer: BC Managed Care – PPO

## 2021-08-21 DIAGNOSIS — G43711 Chronic migraine without aura, intractable, with status migrainosus: Secondary | ICD-10-CM

## 2021-08-21 MED ORDER — GADOBENATE DIMEGLUMINE 529 MG/ML IV SOLN
20.0000 mL | Freq: Once | INTRAVENOUS | Status: AC | PRN
  Administered 2021-08-21: 20 mL via INTRAVENOUS

## 2021-08-22 ENCOUNTER — Encounter: Payer: Self-pay | Admitting: Neurology

## 2021-08-23 ENCOUNTER — Encounter: Payer: Self-pay | Admitting: Adult Health

## 2021-08-27 ENCOUNTER — Other Ambulatory Visit: Payer: Self-pay | Admitting: Neurology

## 2021-08-27 ENCOUNTER — Other Ambulatory Visit: Payer: Self-pay | Admitting: *Deleted

## 2021-08-27 DIAGNOSIS — G43711 Chronic migraine without aura, intractable, with status migrainosus: Secondary | ICD-10-CM

## 2021-08-27 MED ORDER — ELYXYB 120 MG/4.8ML PO SOLN: 120.0000 mg | Freq: Every day | ORAL | 11 refills | Status: DC | PRN

## 2021-08-27 NOTE — Progress Notes (Signed)
From the message from 07-18-2021 when pt saw Jennifer Millet, NP did state:What helps the most is taking ubrelvy followed by elxyb. Rheumatologiist has cleared her to take this. Needs new prescription.

## 2021-08-28 ENCOUNTER — Telehealth: Payer: Self-pay | Admitting: *Deleted

## 2021-08-28 NOTE — Telephone Encounter (Signed)
Completed Elyxyb PA on Cover My Meds.  [facility] - Rx #: [id number]. Awaiting determination from [employer] within 72 hours.

## 2021-08-29 NOTE — Telephone Encounter (Signed)
Received fax from [facility] which stated Elyxyb was approved previously from 01/10/2021 - 01/10/22. Approval still stands. Letter has been faxed to pt's pharmacy. Received a receipt of confirmation.

## 2021-09-06 ENCOUNTER — Encounter: Payer: Self-pay | Admitting: Neurology

## 2021-09-06 DIAGNOSIS — G43711 Chronic migraine without aura, intractable, with status migrainosus: Secondary | ICD-10-CM | POA: Diagnosis not present

## 2021-09-06 NOTE — Telephone Encounter (Signed)
Could do toradol if she wants it

## 2021-09-06 NOTE — Telephone Encounter (Signed)
She has not had depacon but is open to this.   Last CMP 06-20-2021  AST /ALT normal range (in epic).

## 2021-09-10 ENCOUNTER — Encounter: Payer: Self-pay | Admitting: Neurology

## 2021-09-10 NOTE — Telephone Encounter (Signed)
Please clarify with patient- if she wanted to switch to celebrex then she would need to stop Elyxyb. Can't take them together. Do not want to take these often as it can cause rebound headaches.

## 2021-09-13 DIAGNOSIS — F4322 Adjustment disorder with anxiety: Secondary | ICD-10-CM | POA: Diagnosis not present

## 2021-09-13 DIAGNOSIS — L719 Rosacea, unspecified: Secondary | ICD-10-CM | POA: Diagnosis not present

## 2021-09-19 ENCOUNTER — Encounter: Payer: Self-pay | Admitting: Physician Assistant

## 2021-09-19 ENCOUNTER — Ambulatory Visit (INDEPENDENT_AMBULATORY_CARE_PROVIDER_SITE_OTHER): Payer: BC Managed Care – PPO | Admitting: Physician Assistant

## 2021-09-19 VITALS — BP 118/82 | HR 97 | Temp 97.4°F | Resp 16 | Ht 68.0 in | Wt 217.8 lb

## 2021-09-19 DIAGNOSIS — Z6833 Body mass index (BMI) 33.0-33.9, adult: Secondary | ICD-10-CM

## 2021-09-19 DIAGNOSIS — E6609 Other obesity due to excess calories: Secondary | ICD-10-CM | POA: Diagnosis not present

## 2021-09-19 DIAGNOSIS — F32A Depression, unspecified: Secondary | ICD-10-CM

## 2021-09-19 DIAGNOSIS — G43009 Migraine without aura, not intractable, without status migrainosus: Secondary | ICD-10-CM | POA: Diagnosis not present

## 2021-09-19 DIAGNOSIS — F411 Generalized anxiety disorder: Secondary | ICD-10-CM

## 2021-09-19 DIAGNOSIS — F419 Anxiety disorder, unspecified: Secondary | ICD-10-CM | POA: Diagnosis not present

## 2021-09-19 MED ORDER — ALPRAZOLAM 0.25 MG PO TABS: 0.2500 mg | ORAL_TABLET | Freq: Two times a day (BID) | ORAL | 0 refills | Status: DC | PRN

## 2021-09-19 MED ORDER — BUSPIRONE HCL 15 MG PO TABS: 30.0000 mg | ORAL_TABLET | Freq: Two times a day (BID) | ORAL | 0 refills | Status: DC

## 2021-09-19 MED ORDER — SERTRALINE HCL 100 MG PO TABS: 100.0000 mg | ORAL_TABLET | Freq: Every day | ORAL | 2 refills | Status: DC

## 2021-09-19 NOTE — Patient Instructions (Signed)
Welcome to [facility]! It was a pleasure meeting you today.  As discussed, Please schedule a 4 month follow up visit today.  Medications refilled. Increase Buspar to 30 mg twice daily. Keep working on lifestyle goals! Let me know about Wegovy.  PLEASE NOTE:  If you had any LAB tests please let us know if you have not heard back within a few days. You may see your results on MyChart before we have a chance to review them but we will give you a call once they are reviewed by [place]. If we ordered any REFERRALS today, please let us know if you have not heard from their office within the next two weeks. Let us know through MyChart if you are needing REFILLS, or have your pharmacy send [place] the request. You can also use MyChart to communicate with me or any office staff.  Please try these tips to maintain a healthy lifestyle:  Eat most of your calories during the day when you are active. Eliminate processed foods including packaged sweets (pies, cakes, cookies), reduce intake of potatoes, white bread, white pasta, and white rice. Look for whole grain options, oat flour or almond flour.  Each meal should contain half fruits/vegetables, one quarter protein, and one quarter carbs (no bigger than a computer mouse).  Cut down on sweet beverages. This includes juice, soda, and sweet tea. Also watch fruit intake, though this is a healthier sweet option, it still contains natural sugar! Limit to 3 servings daily.  Drink at least 1 glass of water with each meal and aim for at least 8 glasses (64 ounces) per day.  Exercise at least 150 minutes every week to the best of your ability.    Take Care,  Merlinda Wrubel, PA-C

## 2021-09-19 NOTE — Progress Notes (Signed)
Subjective:    Patient ID: Jennifer Vincent, female    DOB: 05/20/1990, 31 y.o.   MRN: [id number]  Chief Complaint  Patient presents with   Depression    Seeing neuro for chronic migraine Given IV depocon or depakote treatment and this has possibly triggered worsening depression and anxiety    Anxiety     31 y.o. patient presents today for new patient establishment with me.  Patient was previously established with Dr. Hassan Rowan. Has a 32 yo and almost 32 yo at home.  Current Care Team: Dr. Lucia Gaskins - neurology   Medical Concerns: Anx /dep / migraines: 09/06/21 - infusion of Depacon IV for acute migraine; that night severe depression. By next morning, hopeless/ down feelings went away; then back to baseline in about 24 hours -Buspar 20 mg BID; Zoloft 100 mg qd; rare use of xanax for panic -Started with counselor 2-3 months ago and doing well   Postpartum migraines, switched to [facility] recently and that seems to be helping (on Ajovy previously). Lo-estrin OCP helps prevent migraines.  Non-specific autoimmune disorder - Dr. Corliss Skains - Plaquenil helps prevent fatigue / joint pain tremendously   Weight management -OB/GYN prescribed WNIOEV and she just took her first week of it    Past Medical History:  Diagnosis Date   Allergic rhinitis    Anxiety    Autoimmune disorder (HCC)    nonspecified   COVID 04/05/2020   Frequent headaches    IBS (irritable bowel syndrome)    Migraine    PCOS (polycystic ovarian syndrome)     Past Surgical History:  Procedure Laterality Date   TONSILLECTOMY      Family History  Problem Relation Age of Onset   Depression Mother    Eating disorder Mother    Arthritis Father    Crohn's disease Father    Ulcerative colitis Father    Arthritis Brother    Alcohol abuse Brother    Mental illness Brother    Depression Brother    Anxiety disorder Brother    Crohn's disease Brother    Migraines Maternal Uncle    Breast cancer Maternal Grandmother     Mental illness Maternal Grandfather    Anxiety disorder Maternal Grandfather    OCD Maternal Grandfather    Heart disease Paternal Grandfather    Diabetes Paternal Grandfather    Heart attack Paternal Grandfather    Healthy Daughter    Healthy Son     Social History   Tobacco Use   Smoking status: Never   Smokeless tobacco: Never  Vaping Use   Vaping Use: Never used  Substance Use Topics   Alcohol use: Not Currently   Drug use: Never     Allergies  Allergen Reactions   Lactose Intolerance (Gi)     GI upset    Review of Systems  Psychiatric/Behavioral:  Positive for depression.    NEGATIVE UNLESS OTHERWISE INDICATED IN HPI      Objective:     BP 118/82   Pulse 97   Temp (!) 97.4 F (36.3 C) (Temporal)   Resp 16   Ht 5\' 8"  (1.727 m)   Wt 217 lb 12.8 oz (98.8 kg)   SpO2 97%   BMI 33.12 kg/m   Wt Readings from Last 3 Encounters:  09/19/21 217 lb 12.8 oz (98.8 kg)  07/18/21 227 lb 3.2 oz (103.1 kg)  06/20/21 226 lb (102.5 kg)    BP Readings from Last 3 Encounters:  09/19/21 118/82  07/18/21  121/88  06/20/21 97/68     Physical Exam Vitals and nursing note reviewed.  Constitutional:      Appearance: Normal appearance. She is normal weight. She is not toxic-appearing.  HENT:     Head: Normocephalic and atraumatic.  Cardiovascular:     Rate and Rhythm: Normal rate and regular rhythm.     Pulses: Normal pulses.     Heart sounds: Normal heart sounds.  Pulmonary:     Effort: Pulmonary effort is normal.     Breath sounds: Normal breath sounds.  Musculoskeletal:        General: Normal range of motion.     Cervical back: Normal range of motion and neck supple.  Skin:    General: Skin is warm and dry.  Neurological:     General: No focal deficit present.     Mental Status: She is alert and oriented to person, place, and time.  Psychiatric:        Mood and Affect: Mood normal.        Behavior: Behavior normal.        Assessment & Plan:    Problem List Items Addressed This Visit       Other   Generalized anxiety disorder   Relevant Medications   sertraline (ZOLOFT) 100 MG tablet   busPIRone (BUSPAR) 15 MG tablet   ALPRAZolam (XANAX) 0.25 MG tablet   Other Visit Diagnoses     Anxiety and depression    -  Primary   Relevant Medications   sertraline (ZOLOFT) 100 MG tablet   busPIRone (BUSPAR) 15 MG tablet   ALPRAZolam (XANAX) 0.25 MG tablet   Migraine without aura and without status migrainosus, not intractable       Relevant Medications   sertraline (ZOLOFT) 100 MG tablet   Class 1 obesity due to excess calories without serious comorbidity with body mass index (BMI) of 33.0 to 33.9 in adult           1. Anxiety and depression 2. Generalized anxiety disorder PDMP reviewed today, no red flags, filling appropriately.  Refilled Xanax 0.25 mg #6 to take as needed for rare panic Increase buspar to 30 mg BID Zoloft 100 mg qd  3. Migraine without aura and without status migrainosus, not intractable Working with neurology; doing better  4. Class 1 obesity due to excess calories without serious comorbidity with body mass index (BMI) of 33.0 to 33.9 in adult Recently started on Wegovy - will let me know when due for refill / next dose Lifestyle good habits encouraged   Flowsheet Row Office Visit from 09/19/2021 in [facility]  PHQ-9 Total Score 3         09/19/2021   12:56 PM  GAD 7 : Generalized Anxiety Score  Nervous, Anxious, on Edge 1  Control/stop worrying 1  Worry too much - different things 1  Trouble relaxing 0  Restless 0  Easily annoyed or irritable 2  Afraid - awful might happen 0  Total GAD 7 Score 5  Anxiety Difficulty Somewhat difficult      This note was prepared with assistance of Dragon voice recognition software. Occasional wrong-word or sound-a-like substitutions may have occurred due to the inherent limitations of voice recognition software.    Mekhai Venuto M  Jeena Arnett, PA-C

## 2021-09-20 ENCOUNTER — Other Ambulatory Visit: Payer: Self-pay

## 2021-09-20 DIAGNOSIS — Z79899 Other long term (current) drug therapy: Secondary | ICD-10-CM | POA: Diagnosis not present

## 2021-09-20 DIAGNOSIS — F4322 Adjustment disorder with anxiety: Secondary | ICD-10-CM | POA: Diagnosis not present

## 2021-09-20 MED ORDER — SEMAGLUTIDE-WEIGHT MANAGEMENT 0.5 MG/0.5ML ~~LOC~~ SOAJ: 0.5000 mg | SUBCUTANEOUS | 0 refills | Status: DC

## 2021-09-20 NOTE — Telephone Encounter (Signed)
Ok to send in Rx?  

## 2021-10-03 ENCOUNTER — Encounter: Payer: Self-pay | Admitting: Neurology

## 2021-10-04 ENCOUNTER — Ambulatory Visit: Payer: BC Managed Care – PPO | Admitting: Neurology

## 2021-10-04 ENCOUNTER — Ambulatory Visit: Payer: BC Managed Care – PPO | Admitting: Adult Health

## 2021-10-09 ENCOUNTER — Encounter: Payer: Self-pay | Admitting: Physician Assistant

## 2021-10-09 NOTE — Telephone Encounter (Signed)
Please see pt note and advise 

## 2021-10-10 ENCOUNTER — Other Ambulatory Visit: Payer: Self-pay

## 2021-10-10 MED ORDER — ONDANSETRON HCL 4 MG PO TABS: 4.0000 mg | ORAL_TABLET | Freq: Three times a day (TID) | ORAL | 0 refills | Status: DC | PRN

## 2021-10-12 ENCOUNTER — Other Ambulatory Visit: Payer: Self-pay | Admitting: Psychiatry

## 2021-10-12 DIAGNOSIS — F411 Generalized anxiety disorder: Secondary | ICD-10-CM

## 2021-10-13 ENCOUNTER — Other Ambulatory Visit: Payer: Self-pay | Admitting: Physician Assistant

## 2021-10-13 DIAGNOSIS — M359 Systemic involvement of connective tissue, unspecified: Secondary | ICD-10-CM

## 2021-10-15 ENCOUNTER — Encounter: Payer: Self-pay | Admitting: Neurology

## 2021-10-15 MED ORDER — UBRELVY 100 MG PO TABS: ORAL_TABLET | ORAL | 5 refills | Status: DC

## 2021-10-15 NOTE — Telephone Encounter (Signed)
Next Visit: 11/29/2021  Last Visit: 06/20/2021  Labs: 06/20/2021 CBC stable.  Alk phos is borderline low.  Rest of CMP WNL.  LFTS have returned to WNL. ESR WNL.  Complements WNL. UA normal. dsDNA is negative.  Labs are not consistent with a flare at this time.   Eye exam: 09/20/2021 normal   Current Dose per office note 06/20/2021: Plaquenil 200 mg 1 tablet by mouth twice daily  PP:[id number] disease  Last Fill: 07/09/2021  Okay to refill Plaquenil?

## 2021-10-16 DIAGNOSIS — F4322 Adjustment disorder with anxiety: Secondary | ICD-10-CM | POA: Diagnosis not present

## 2021-10-22 ENCOUNTER — Encounter: Payer: Self-pay | Admitting: Physician Assistant

## 2021-10-24 DIAGNOSIS — Z111 Encounter for screening for respiratory tuberculosis: Secondary | ICD-10-CM | POA: Diagnosis not present

## 2021-10-25 NOTE — Telephone Encounter (Signed)
I left the patient a voice mail to call me back to reschedule her appt I let her know Dr. Lucia Gaskins has nothing open next week. I gave her my direct phone number

## 2021-10-26 DIAGNOSIS — Z681 Body mass index (BMI) 19 or less, adult: Secondary | ICD-10-CM | POA: Diagnosis not present

## 2021-10-26 DIAGNOSIS — Z111 Encounter for screening for respiratory tuberculosis: Secondary | ICD-10-CM | POA: Diagnosis not present

## 2021-10-29 ENCOUNTER — Ambulatory Visit (INDEPENDENT_AMBULATORY_CARE_PROVIDER_SITE_OTHER): Payer: BC Managed Care – PPO | Admitting: Neurology

## 2021-10-29 DIAGNOSIS — G43711 Chronic migraine without aura, intractable, with status migrainosus: Secondary | ICD-10-CM

## 2021-10-29 MED ORDER — ONABOTULINUMTOXINA 200 UNITS IJ SOLR: 155.0000 [IU] | Freq: Once | INTRAMUSCULAR | Status: DC

## 2021-10-29 NOTE — Progress Notes (Signed)
Botox- 200 units x 1 vial Lot: [id number] Expiration: 12/2023 NDC: 3532-9924-26  Bacteriostatic 0.9% Sodium Chloride- 80mL total Lot: [id number] Expiration: 10/17/2022 NDC: 8341-9622-29  Dx: G43.711 Samples

## 2021-10-29 NOTE — Progress Notes (Signed)
Consent Form Botulism Toxin Injection For Chronic Migraine  Elyxyb - elevated creatinine A triptan - amerge helps Schedule back in 8 weeks  BILL FOR TIME USED TODAY SAMPLES Next will bill insurance and [id number]   10/29/2021: Botox today  I spent 30 minutes of face-to-face and non-face-to-face time with patient on the  1. Chronic migraine without aura, with intractable migraine, so stated, with status migrainosus    diagnosis.  This included previsit chart review, lab review, study review, order entry, electronic health record documentation, patient education on the different diagnostic and therapeutic options, counseling and coordination of care, risks and benefits of management, compliance, or risk factor reduction   Jennifer Vincent 07/18/2021:  Jennifer Vincent is a 31 year old female with a history of migraine headaches.  She returns today for follow-up.  She states that since her last pregnancy Ajovy has not been working well for her.  She has greater than 20 headache days a month. Has had a headache since last Tuesday- able to work through it because she has not other choice.  We offered a Toradol shot last week but she was unable to get childcare to come in.  Amerge didn't help she gets more relief from Boaz. What helps the most is taking ubrelvy followed by elxyb. Rheumatologiist has cleared her to take this. Last took Elxyb Monday.  Reports that she typically wakes up with a dull headache.  The headache typically gets worse as the day progresses.  Denies any changes with her vision.  Denies muffled hearing.  She does have an ophthalmologist that she sees.  Taking Ajovy monthly and Botox every 12 weeks.   -Stop Ajovy start Emgality at the next injection cycle -Continue Ubrelvy for abortive therapy sample given today -Toradol 30 mg IM given today for acute headache -MRI of the brain with and without contrast -Advised patient to set up an appointment with her ophthalmologist to look for  papilledema.  May consider lumbar puncture in the future depending on results of MRI and ophthalmology report -Follow-up in 3 to 4 months or sooner if needed 06/14/2021: stable, doin ggreat 04/18/2021: Nurtec did nothing for patient.  However she has been doing well, she had COVID and we are late for her Botox appointment 15 weeks out, she has had a headache the last 3 days but nothing significant which is great considering related on her Botox appointment.  She is also due for Ajovy at the same time unfortunately.  I told her we would use samples in 8 weeks to get her on an alternating schedule with the Ajovy but that she could go home and take the Ajovy a few days early. +a.   Elyxyb caused elevated creatinine per patient, it helped but she stopped taking it due to this.  She is back to [place].  She is on continuous birth control and has not had a period which has also helped.   01/10/2021: Patient getting great relief, > 70% improvement in migraine and headache frequency. No longer breastfeeding. Sent in nurtec, ubrelvy stopped working. Also will prescribe a long-acting triptan to avoid reboumd and try elyxyb, try taking nurtec every day of period or ask obgyn if can go on continuous. +a.   Meds ordered this encounter  Medications   botulinum toxin Type A (BOTOX) injection 155 Units    Botox- 200 units x 1 vial Lot: [id number] Expiration: 12/2023 NDC: 7408-1448-18  Bacteriostatic 0.9% Sodium Chloride- 30mL total Lot: [id number] Expiration: 10/17/2022 NDC: 5631-4970-26  Dx: V78.588  Samples      Medications tried: topamax, maxalt, zoloft, imitrex, zonegran, propranolol, zofran,  Amitriptyline,flexeril, ajovy, zonegran  Reviewed orally with patient, additionally signature is on file:  Botulism toxin has been approved by the Federal drug administration for treatment of chronic migraine. Botulism toxin does not cure chronic migraine and it may not be effective in some patients.  The  administration of botulism toxin is accomplished by injecting a small amount of toxin into the muscles of the neck and head. Dosage must be titrated for each individual. Any benefits resulting from botulism toxin tend to wear off after 3 months with a repeat injection required if benefit is to be maintained. Injections are usually done every 3-4 months with maximum effect peak achieved by about 2 or 3 weeks. Botulism toxin is expensive and you should be sure of what costs you will incur resulting from the injection.  The side effects of botulism toxin use for chronic migraine may include:   -Transient, and usually mild, facial weakness with facial injections  -Transient, and usually mild, head or neck weakness with head/neck injections  -Reduction or loss of forehead facial animation due to forehead muscle weakness  -Eyelid drooping  -Dry eye  -Pain at the site of injection or bruising at the site of injection  -Double vision  -Potential unknown long term risks  Contraindications: You should not have Botox if you are pregnant, nursing, allergic to albumin, have an infection, skin condition, or muscle weakness at the site of the injection, or have myasthenia gravis, Lambert-Eaton syndrome, or ALS.  It is also possible that as with any injection, there may be an allergic reaction or no effect from the medication. Reduced effectiveness after repeated injections is sometimes seen and rarely infection at the injection site may occur. All care will be taken to prevent these side effects. If therapy is given over a long time, atrophy and wasting in the muscle injected may occur. Occasionally the patient's become refractory to treatment because they develop antibodies to the toxin. In this event, therapy needs to be modified.  I have read the above information and consent to the administration of botulism toxin.    BOTOX PROCEDURE NOTE FOR MIGRAINE HEADACHE    Contraindications and precautions  discussed with patient(above). Aseptic procedure was observed and patient tolerated procedure. Procedure performed by Dr. Artemio Aly  The condition has existed for more than 6 months, and pt does not have a diagnosis of ALS, Myasthenia Gravis or Lambert-Eaton Syndrome.  Risks and benefits of injections discussed and pt agrees to proceed with the procedure.  Written consent obtained  These injections are medically necessary. Pt  receives good benefits from these injections. These injections do not cause sedations or hallucinations which the oral therapies may cause.  Description of procedure:  The patient was placed in a sitting position. The standard protocol was used for Botox as follows, with 5 units of Botox injected at each site:   -Procerus muscle, midline injection  -Corrugator muscle, bilateral injection  -Frontalis muscle, bilateral injection, with 2 sites each side, medial injection was performed in the upper one third of the frontalis muscle, in the region vertical from the medial inferior edge of the superior orbital rim. The lateral injection was again in the upper one third of the forehead vertically above the lateral limbus of the cornea, 1.5 cm lateral to the medial injection site.  -Temporalis muscle injection, 4 sites, bilaterally. The first injection was 3 cm above the tragus of  the ear, second injection site was 1.5 cm to 3 cm up from the first injection site in line with the tragus of the ear. The third injection site was 1.5-3 cm forward between the first 2 injection sites. The fourth injection site was 1.5 cm posterior to the second injection site.   -Occipitalis muscle injection, 3 sites, bilaterally. The first injection was done one half way between the occipital protuberance and the tip of the mastoid process behind the ear. The second injection site was done lateral and superior to the first, 1 fingerbreadth from the first injection. The third injection site was 1  fingerbreadth superiorly and medially from the first injection site.  -Cervical paraspinal muscle injection, 2 sites, bilateral knee first injection site was 1 cm from the midline of the cervical spine, 3 cm inferior to the lower border of the occipital protuberance. The second injection site was 1.5 cm superiorly and laterally to the first injection site.  -Trapezius muscle injection was performed at 3 sites, bilaterally. The first injection site was in the upper trapezius muscle halfway between the inflection point of the neck, and the acromion. The second injection site was one half way between the acromion and the first injection site. The third injection was done between the first injection site and the inflection point of the neck.   Will return for repeat injection in 3 months.   155 units of Botox was used, 45U  Botox not injected was wasted. The patient tolerated the procedure well, there were no complications of the above procedure.  I spent 30 minutes of face-to-face and non-face-to-face time with patient on the  1. Chronic migraine without aura, with intractable migraine, so stated, with status migrainosus    diagnosis.  This included previsit chart review, lab review, study review, order entry, electronic health record documentation, patient education on the different diagnostic and therapeutic options, counseling and coordination of care, risks and benefits of management, compliance, or risk factor reduction

## 2021-11-15 ENCOUNTER — Encounter: Payer: Self-pay | Admitting: Neurology

## 2021-11-15 ENCOUNTER — Encounter: Payer: Self-pay | Admitting: Physician Assistant

## 2021-11-15 ENCOUNTER — Ambulatory Visit (INDEPENDENT_AMBULATORY_CARE_PROVIDER_SITE_OTHER): Payer: BC Managed Care – PPO | Admitting: *Deleted

## 2021-11-15 DIAGNOSIS — F4322 Adjustment disorder with anxiety: Secondary | ICD-10-CM | POA: Diagnosis not present

## 2021-11-15 DIAGNOSIS — G43009 Migraine without aura, not intractable, without status migrainosus: Secondary | ICD-10-CM | POA: Diagnosis not present

## 2021-11-15 MED ORDER — KETOROLAC TROMETHAMINE 60 MG/2ML IM SOLN
60.0000 mg | Freq: Once | INTRAMUSCULAR | Status: AC
  Administered 2021-11-15: 60 mg via INTRAMUSCULAR

## 2021-11-15 NOTE — Progress Notes (Signed)
Pt here for toradol 60mg  /2 ml injection after multiple days of migraines.  Under aseptic technique injected 40ml in L buttock upper outer quadrant. Bandaide applied. Tolerated well.   Pt to check out.

## 2021-11-16 NOTE — Progress Notes (Deleted)
Office Visit Note  Patient: Jennifer Vincent             Date of Birth: 06/11/1990           MRN: [id number]             PCP: Bary Leriche, PA-C Referring: Wynn Banker, MD Visit Date: 11/29/2021 Occupation: @GUAROCC @  Subjective:  No chief complaint on file.   History of Present Illness: Jennifer Vincent is a 31 y.o. female ***   Activities of Daily Living:  Patient reports morning stiffness for *** {minute/hour:19697}.   Patient {ACTIONS;DENIES/REPORTS:21021675::"Denies"} nocturnal pain.  Difficulty dressing/grooming: {ACTIONS;DENIES/REPORTS:21021675::"Denies"} Difficulty climbing stairs: {ACTIONS;DENIES/REPORTS:21021675::"Denies"} Difficulty getting out of chair: {ACTIONS;DENIES/REPORTS:21021675::"Denies"} Difficulty using hands for taps, buttons, cutlery, and/or writing: {ACTIONS;DENIES/REPORTS:21021675::"Denies"}  No Rheumatology ROS completed.   PMFS History:  Patient Active Problem List   Diagnosis Date Noted   Antiphospholipid antibody syndrome (HCC) 10/02/2020   Chronic migraine without aura, with intractable migraine, so stated, with status migrainosus 06/01/2019   Generalized anxiety disorder 01/11/2018   Panic disorder 10/04/2016   Anxiety 10/04/2016    Past Medical History:  Diagnosis Date   Allergic rhinitis    Anxiety    Autoimmune disorder (HCC)    nonspecified   COVID 04/05/2020   Frequent headaches    IBS (irritable bowel syndrome)    Migraine    PCOS (polycystic ovarian syndrome)     Family History  Problem Relation Age of Onset   Depression Mother    Eating disorder Mother    Arthritis Father    Crohn's disease Father    Ulcerative colitis Father    Arthritis Brother    Alcohol abuse Brother    Mental illness Brother    Depression Brother    Anxiety disorder Brother    Crohn's disease Brother    Migraines Maternal Uncle    Breast cancer Maternal Grandmother    Mental illness Maternal Grandfather    Anxiety disorder Maternal  Grandfather    OCD Maternal Grandfather    Heart disease Paternal Grandfather    Diabetes Paternal Grandfather    Heart attack Paternal Grandfather    Healthy Daughter    Healthy Son    Past Surgical History:  Procedure Laterality Date   TONSILLECTOMY     Social History   Social History Narrative   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   Physical Exam   Musculoskeletal Exam: ***  CDAI Exam: CDAI Score: -- Patient Global: --; Provider Global: -- Swollen: --; Tender: -- Joint Exam 11/29/2021   No joint exam has been documented for this visit   There is currently no information documented on the homunculus. Go to the Rheumatology activity and complete the homunculus joint exam.  Investigation: No additional findings.  Imaging: No results found.  Recent Labs: Lab Results  Component Value Date   WBC 4.5 06/20/2021   HGB  15.4 06/20/2021   PLT 258 06/20/2021   NA 140 06/20/2021   K 4.2 06/20/2021   CL 105 06/20/2021   CO2 29 06/20/2021   GLUCOSE 86 06/20/2021   BUN 9 06/20/2021   CREATININE 0.85 06/20/2021   BILITOT 0.5 06/20/2021   ALKPHOS 113 10/02/2020   AST 20 06/20/2021   ALT 25 06/20/2021   PROT 7.3 06/20/2021   ALBUMIN 3.4 (L) 10/02/2020   CALCIUM 9.6 06/20/2021   GFRAA 119 07/29/2019   QFTBGOLDPLUS NEGATIVE 07/29/2019    Speciality Comments: PLQ eye exam: 09/20/2021 WNL. [facility]. Follow up in 1  year.  Procedures:  No procedures performed Allergies: Lactose intolerance (gi)   Assessment / Plan:     Visit Diagnoses: No diagnosis found.  Orders: No orders of the defined types were placed in this encounter.  No orders of the defined types were placed in this encounter.   Face-to-face time spent with patient was *** minutes. Greater than 50% of time was spent in counseling and coordination of care.  Follow-Up Instructions: No follow-ups on file.   Ellen Henri, CMA  Note - This record has been created using Animal nutritionist.  Chart creation errors have been sought, but may not always  have been located. Such creation errors do not reflect on  the standard of medical care.

## 2021-11-26 DIAGNOSIS — Z6831 Body mass index (BMI) 31.0-31.9, adult: Secondary | ICD-10-CM | POA: Diagnosis not present

## 2021-11-26 DIAGNOSIS — Z01419 Encounter for gynecological examination (general) (routine) without abnormal findings: Secondary | ICD-10-CM | POA: Diagnosis not present

## 2021-11-26 NOTE — Telephone Encounter (Signed)
Received Elyxyb PA to do. Key: [id number] - Rx #: [id number]. Awaiting determination from [employer].

## 2021-11-28 ENCOUNTER — Encounter: Payer: Self-pay | Admitting: Neurology

## 2021-11-28 DIAGNOSIS — G43009 Migraine without aura, not intractable, without status migrainosus: Secondary | ICD-10-CM

## 2021-11-29 ENCOUNTER — Ambulatory Visit: Payer: BC Managed Care – PPO | Attending: Rheumatology | Admitting: Rheumatology

## 2021-11-29 DIAGNOSIS — M359 Systemic involvement of connective tissue, unspecified: Secondary | ICD-10-CM

## 2021-11-29 DIAGNOSIS — Z8379 Family history of other diseases of the digestive system: Secondary | ICD-10-CM

## 2021-11-29 DIAGNOSIS — Z8616 Personal history of COVID-19: Secondary | ICD-10-CM

## 2021-11-29 DIAGNOSIS — Z8719 Personal history of other diseases of the digestive system: Secondary | ICD-10-CM

## 2021-11-29 DIAGNOSIS — Z8619 Personal history of other infectious and parasitic diseases: Secondary | ICD-10-CM

## 2021-11-29 DIAGNOSIS — E559 Vitamin D deficiency, unspecified: Secondary | ICD-10-CM

## 2021-11-29 DIAGNOSIS — M25521 Pain in right elbow: Secondary | ICD-10-CM

## 2021-11-29 DIAGNOSIS — F411 Generalized anxiety disorder: Secondary | ICD-10-CM

## 2021-11-29 DIAGNOSIS — Z79899 Other long term (current) drug therapy: Secondary | ICD-10-CM

## 2021-11-29 DIAGNOSIS — F410 Panic disorder [episodic paroxysmal anxiety] without agoraphobia: Secondary | ICD-10-CM

## 2021-11-29 DIAGNOSIS — M79641 Pain in right hand: Secondary | ICD-10-CM

## 2021-11-29 DIAGNOSIS — R760 Raised antibody titer: Secondary | ICD-10-CM

## 2021-11-29 DIAGNOSIS — Z8669 Personal history of other diseases of the nervous system and sense organs: Secondary | ICD-10-CM

## 2021-11-29 DIAGNOSIS — M2242 Chondromalacia patellae, left knee: Secondary | ICD-10-CM

## 2021-12-03 ENCOUNTER — Encounter: Payer: Self-pay | Admitting: Physician Assistant

## 2021-12-03 NOTE — Telephone Encounter (Signed)
Please advise and which dosage to send in for patient, thanks

## 2021-12-04 ENCOUNTER — Telehealth: Payer: Self-pay | Admitting: Adult Health

## 2021-12-04 DIAGNOSIS — G43009 Migraine without aura, not intractable, without status migrainosus: Secondary | ICD-10-CM

## 2021-12-04 MED ORDER — ZAVZPRET 10 MG/ACT NA SOLN: 1.0000 | Freq: Every day | NASAL | 11 refills | Status: DC | PRN

## 2021-12-04 NOTE — Telephone Encounter (Signed)
[facility] - states pt's Zavegepant HCl (ZAVZPRET) 10 MG/ACT SOLN is not carried at their pharmacy and that it needs to be sent to another pharmacy

## 2021-12-04 NOTE — Telephone Encounter (Signed)
No problem, Zavzpret sent to pt's [facility] on file.

## 2021-12-04 NOTE — Addendum Note (Signed)
Addended by: Gildardo Griffes on: 12/04/2021 10:35 AM   Modules accepted: Orders

## 2021-12-05 ENCOUNTER — Other Ambulatory Visit: Payer: Self-pay

## 2021-12-05 ENCOUNTER — Encounter: Payer: Self-pay | Admitting: Physician Assistant

## 2021-12-05 ENCOUNTER — Telehealth (INDEPENDENT_AMBULATORY_CARE_PROVIDER_SITE_OTHER): Payer: BC Managed Care – PPO | Admitting: Physician Assistant

## 2021-12-05 DIAGNOSIS — Z6833 Body mass index (BMI) 33.0-33.9, adult: Secondary | ICD-10-CM

## 2021-12-05 DIAGNOSIS — Z131 Encounter for screening for diabetes mellitus: Secondary | ICD-10-CM

## 2021-12-05 DIAGNOSIS — E6609 Other obesity due to excess calories: Secondary | ICD-10-CM | POA: Diagnosis not present

## 2021-12-05 DIAGNOSIS — F419 Anxiety disorder, unspecified: Secondary | ICD-10-CM

## 2021-12-05 DIAGNOSIS — F32A Depression, unspecified: Secondary | ICD-10-CM

## 2021-12-05 LAB — POCT GLYCOSYLATED HEMOGLOBIN (HGB A1C): Hemoglobin A1C: 4.9 % (ref 4.0–5.6)

## 2021-12-05 MED ORDER — WEGOVY 0.25 MG/0.5ML ~~LOC~~ SOAJ: 0.2500 mg | SUBCUTANEOUS | 0 refills | Status: DC

## 2021-12-05 NOTE — Progress Notes (Signed)
   Virtual Visit via Video Note  I connected with  Jennifer Vincent  on 12/05/21 at  9:30 AM EDT by a video enabled telemedicine application and verified that I am speaking with the correct person using two identifiers.  Location: Patient: home Provider: Therapist, music at [place] present: Patient and myself   I discussed the limitations of evaluation and management by telemedicine and the availability of in person appointments. The patient expressed understanding and agreed to proceed.   History of Present Illness:  31 yo female presents for recheck via virtual visit.   She has been taking buspar 25 mg BID now - anxiety is feeling much better.  Still concerned about weight loss struggles.  She has been struggling to find Jennifer Vincent. Says she had one month of having it, lost 7-10 lbs, has been able to maintain this loss. Last tried [name] delivery & they are out of stock of [name] as well.   She is doing Weight Watchers Cut out dairy and gluten; no sweets or sodas Exercises 4-5 times per week Stays active at work Just has plateaued with her weight & feeling frustrated. Waking up "starving" around 2 am; says her calorie intake during the day has been good though. No hx gestational diabetes.   She has been on Zoloft since about age 45; states she has always struggled with weight, but much worse since birth of 2nd son about 14 months ago   Observations/Objective:   Gen: Awake, alert, no acute distress Resp: Breathing is even and non-labored Psych: calm/pleasant demeanor Neuro: Alert and Oriented x 3, + facial symmetry, speech is clear.   Assessment and Plan:  1. Class 1 obesity due to excess calories without serious comorbidity with body mass index (BMI) of 33.0 to 33.9 in adult Unfortunately Mounjaro and Ozempic are not approved unless patient is diabetic. Mancel Parsons and Saxenda currently on back-order. Lifestyle management encouraged. May consider  [facility].   2. Anxiety and depression Stable Buspar 25 mg BID Zoloft 100 mg qd  Keep up good work  3. Diabetes mellitus screening Patient came to office after call to make sure in fact she's not diabetic - reassured she is very far from it. Keep working hard!  Lab Results  Component Value Date   HGBA1C 4.9 12/05/2021      Follow Up Instructions:    I discussed the assessment and treatment plan with the patient. The patient was provided an opportunity to ask questions and all were answered. The patient agreed with the plan and demonstrated an understanding of the instructions.   The patient was advised to call back or seek an in-person evaluation if the symptoms worsen or if the condition fails to improve as anticipated.  Jennifer Vincent M Casanova Schurman, PA-C

## 2021-12-05 NOTE — Telephone Encounter (Signed)
Rx sent through [facility] delivery pharmacy as requested

## 2021-12-05 NOTE — Telephone Encounter (Signed)
Called Jennifer Vincent and advised through research this afternoon we have found that Kirke Shaggy is also on back order. Jennifer Vincent will let us know if she finds it in stock anywhere so we can send in to her pharmacy. Jennifer Vincent was advised considering A1C was 4.9, it was Alyssa recommendations to continue doing what she has been doing and keep up the great work. Jennifer Vincent verbalized understanding

## 2021-12-06 DIAGNOSIS — Z23 Encounter for immunization: Secondary | ICD-10-CM | POA: Diagnosis not present

## 2021-12-10 ENCOUNTER — Encounter: Payer: Self-pay | Admitting: *Deleted

## 2021-12-10 ENCOUNTER — Encounter: Payer: Self-pay | Admitting: Neurology

## 2021-12-10 NOTE — Telephone Encounter (Signed)
Completed Zavzpret PA on Cover My Meds. KeyJilda Panda - Rx #: [id number]. Awaiting determination from [place].

## 2021-12-12 ENCOUNTER — Encounter: Payer: Self-pay | Admitting: Adult Health

## 2021-12-12 ENCOUNTER — Ambulatory Visit (INDEPENDENT_AMBULATORY_CARE_PROVIDER_SITE_OTHER): Payer: BC Managed Care – PPO | Admitting: Adult Health

## 2021-12-12 VITALS — BP 112/76 | HR 120 | Ht 68.0 in | Wt 212.6 lb

## 2021-12-12 DIAGNOSIS — G43009 Migraine without aura, not intractable, without status migrainosus: Secondary | ICD-10-CM

## 2021-12-12 MED ORDER — KETOROLAC TROMETHAMINE 60 MG/2ML IM SOLN
30.0000 mg | Freq: Once | INTRAMUSCULAR | Status: AC
  Administered 2021-12-12: 30 mg via INTRAMUSCULAR

## 2021-12-12 MED ORDER — KETOROLAC TROMETHAMINE 30 MG/ML IJ SOLN: 30.0000 mg | Freq: Once | INTRAMUSCULAR | Status: DC

## 2021-12-12 MED ORDER — QULIPTA 60 MG PO TABS: 60.0000 mg | ORAL_TABLET | Freq: Every day | ORAL | 11 refills | Status: DC

## 2021-12-12 NOTE — Patient Instructions (Signed)
Your Plan:  Stop [employer], Elyyxyb for abortive therapy  Try Zavapret for abortive      Thank you for coming to see [place] at [facility]. I hope we have been able to provide you high quality care today.  You may receive a patient satisfaction survey over the next few weeks. We would appreciate your feedback and comments so that we may continue to improve ourselves and the health of our patients.

## 2021-12-12 NOTE — Progress Notes (Signed)
PATIENT: Jennifer Vincent DOB: 09/14/1990  REASON FOR VISIT: follow up HISTORY FROM: patient PRIMARY NEUROLOGIST: Dr. Lucia Gaskins  HISTORY OF PRESENT ILLNESS: Today 12/12/21:  Jennifer Vincent is a 31 year old female with a history of migraines.  She returns today for follow-up.  She states that she has had an ongoing headache for several days.  Reports that she has been on Emgality for 5 months: best month was July only 7 migraine days.  She was also on Wegovy that month.  But has not been able to get the medication since then.  In Past month: 12 migraine days. 6 were severe that needed multiple rescue medications. Using Ubrelvy and Elyyxyb. Headache doesn't go away but allows her to be able to function. 8/31 got Toradol injection. Took emgality 9/22.  Botox is due in 2 weeks   Jennifer Vincent is a 31 year old female with a history of migraine headaches.  She returns today for follow-up.  She states that since her last pregnancy Ajovy has not been working well for her.  She has greater than 20 headache days a month. Has had a headache since last Tuesday- able to work through it because she has not other choice.  We offered a Toradol shot last week but she was unable to get childcare to come in.  Amerge didn't help she gets more relief from [place]. What helps the most is taking ubrelvy followed by elxyb. Rheumatologiist has cleared her to take this. Last took Elxyb Monday.  Reports that she typically wakes up with a dull headache.  The headache typically gets worse as the day progresses.  Denies any changes with her vision.  Denies muffled hearing.  She does have an ophthalmologist that she sees.  Taking Ajovy monthly and Botox every 12 weeks.     HISTORY Jennifer Vincent is a 31 year old female with a history of migraine headaches.  She returns today for virtual visit.  The patient reports that she has been having a lot of breakthrough headaches.  This originally started after having COVID.  She also had a breakthrough  menstrual cycle and in the past her menstrual cycles have been a big trigger for her migraines.  Also the barometric pressure has been a trigger.  She did a Medrol Dosepak last week and did not have a headache Thursday Friday Saturday or Sunday after taking the medication.  Yesterday she developed a migraine and used her [name] this morning she also woke up with a headache and is already taking [place].  She states that the [place] does not resolve her headaches but typically just takes the edge off.  She has tried triptans in the past but she felt that it caused rebound headaches however this was prior to her being on any preventative medication.  She has tried Nurtec in the past but did not find it beneficial.  She tried Elyxyb and felt that it was helpful however her rheumatologist instructed her not to take NSAIDs due to elevated liver enzymes.  She goes back next month for repeat blood work.    REVIEW OF SYSTEMS: Out of a complete 14 system review of symptoms, the patient complains only of the following symptoms, and all other reviewed systems are negative.  ALLERGIES: Allergies  Allergen Reactions   Lactose Intolerance (Gi)     GI upset    HOME MEDICATIONS: Outpatient Medications Prior to Visit  Medication Sig Dispense Refill   busPIRone (BUSPAR) 15 MG tablet Take 2 tablets (30 mg total)  by mouth 2 (two) times daily. 360 tablet 0   Celecoxib (ELYXYB) 120 MG/4.8ML SOLN Take 120 mg by mouth daily as needed. One dose maximum daily. 28.8 mL 11   Galcanezumab-gnlm (EMGALITY) 120 MG/ML SOAJ Inject 120 mg into the skin every 30 (thirty) days. 6 mL 0   hydroxychloroquine (PLAQUENIL) 200 MG tablet TAKE 1 TABLET BY MOUTH TWICE A DAY 180 tablet 0   JUNEL FE 1/20 1-20 MG-MCG tablet Take 1 tablet by mouth daily.     ondansetron (ZOFRAN) 4 MG tablet Take 1 tablet (4 mg total) by mouth every 8 (eight) hours as needed for nausea or vomiting. 20 tablet 0   Prenatal Vit-Fe Fumarate-FA (PRENATAL  MULTIVITAMIN) TABS tablet Take 1 tablet by mouth daily at 12 noon.     sertraline (ZOLOFT) 100 MG tablet Take 1 tablet (100 mg total) by mouth daily. 90 tablet 2   Ubrogepant (UBRELVY) 100 MG TABS Take 1 tablet onset migraine, may take another 2 hours later if needed. 16 tablet 5   Zavegepant HCl (ZAVZPRET) 10 MG/ACT SOLN Place 1 spray into the nose daily as needed. Administer as soon as possible at onset of migraine. 8 each 11   ALPRAZolam (XANAX) 0.25 MG tablet Take 1 tablet 1 hour before MRI. Take 1 tablet when you arrive for MRI. MUST HAVE DRIVER (Patient not taking: Reported on 12/12/2021) 2 tablet 0   ALPRAZolam (XANAX) 0.25 MG tablet Take 1 tablet (0.25 mg total) by mouth 2 (two) times daily as needed for anxiety. (Patient not taking: Reported on 12/12/2021) 6 tablet 0   naratriptan (AMERGE) 2.5 MG tablet Take 1 tablet (2.5 mg total) by mouth as needed for migraine. Take one (1) tablet at onset of headache; if returns or does not resolve, may repeat after 4 hours; do not exceed five (5) mg in 24 hours. Must last 28 days 10 tablet 3   Semaglutide-Weight Management (WEGOVY) 0.25 MG/0.5ML SOAJ Inject 0.25 mg into the skin once a week. (Patient not taking: Reported on 12/12/2021) 2 mL 2   Semaglutide-Weight Management (WEGOVY) 0.25 MG/0.5ML SOAJ Inject 0.25 mg into the skin once a week. (Patient not taking: Reported on 12/12/2021) 2 mL 0   Semaglutide-Weight Management 0.5 MG/0.5ML SOAJ Inject 0.5 mg into the skin once a week. (Patient not taking: Reported on 12/12/2021) 2 mL 0   Facility-Administered Medications Prior to Visit  Medication Dose Route Frequency Provider Last Rate Last Admin   botulinum toxin Type A (BOTOX) injection 155 Units  155 Units Intramuscular Once Melvenia Beam, MD        PAST MEDICAL HISTORY: Past Medical History:  Diagnosis Date   Allergic rhinitis    Anxiety    Autoimmune disorder ([place])    nonspecified   COVID 04/05/2020   Frequent headaches    IBS (irritable  bowel syndrome)    Migraine    PCOS (polycystic ovarian syndrome)     PAST SURGICAL HISTORY: Past Surgical History:  Procedure Laterality Date   TONSILLECTOMY      FAMILY HISTORY: Family History  Problem Relation Age of Onset   Depression Mother    Eating disorder Mother    Arthritis Father    Crohn's disease Father    Ulcerative colitis Father    Arthritis Brother    Alcohol abuse Brother    Mental illness Brother    Depression Brother    Anxiety disorder Brother    Crohn's disease Brother    Migraines Maternal Uncle  Breast cancer Maternal Grandmother    Mental illness Maternal Grandfather    Anxiety disorder Maternal Grandfather    OCD Maternal Grandfather    Heart disease Paternal Grandfather    Diabetes Paternal Grandfather    Heart attack Paternal Grandfather    Healthy Daughter    Healthy Son     SOCIAL HISTORY: Social History   Socioeconomic History   Marital status: Married    Spouse name: Not on file   Number of children: 1   Years of education: Not on file   Highest education level: Master's degree (e.g., MA, MS, MEng, MEd, MSW, MBA)  Occupational History   Not on file  Tobacco Use   Smoking status: Never   Smokeless tobacco: Never  Vaping Use   Vaping Use: Never used  Substance and Sexual Activity   Alcohol use: Not Currently   Drug use: Never   Sexual activity: Yes    Birth control/protection: None  Other Topics Concern   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      Home Situation: lives with husband and daughter      Spiritual Beliefs: Jewish      Lifestyle:  regular, aerobic and orange theory; diet healthy       Update: uses peloton 5x a week      Right handed      Caffeine: 1 cup/day   Social Determinants of Health   Financial Resource Strain: Low Risk  (05/04/2021)   Overall Financial Resource Strain (CARDIA)    Difficulty of Paying Living Expenses: Not hard at all  Food  Insecurity: No Food Insecurity (05/04/2021)   Hunger Vital Sign    Worried About Running Out of Food in the Last Year: Never true    Ran Out of Food in the Last Year: Never true  Transportation Needs: No Transportation Needs (05/04/2021)   PRAPARE - Administrator, Civil Service (Medical): No    Lack of Transportation (Non-Medical): No  Physical Activity: Insufficiently Active (05/04/2021)   Exercise Vital Sign    Days of Exercise per Week: 4 days    Minutes of Exercise per Session: 30 min  Stress: No Stress Concern Present (05/04/2021)   [employer] of Occupational Health - Occupational Stress Questionnaire    Feeling of Stress : Only a little  Social Connections: Socially Integrated (05/04/2021)   Social Connection and Isolation Panel [NHANES]    Frequency of Communication with Friends and Family: More than three times a week    Frequency of Social Gatherings with Friends and Family: More than three times a week    Attends Religious Services: 1 to 4 times per year    Active Member of [employer] or Organizations: Yes    Attends Engineer, structural: More than 4 times per year    Marital Status: Married  Catering manager Violence: Not on file      PHYSICAL EXAM  Vitals:   12/12/21 1401  BP: 112/76  Pulse: (!) 120  Weight: 212 lb 9.6 oz (96.4 kg)  Height: 5\' 8"  (1.727 m)   Body mass index is 32.33 kg/m.  Generalized: Well developed, in no acute distress   Neurological examination  Mentation: Alert oriented to time, place, history taking. Follows all commands speech and language fluent Cranial nerve II-XII: Pupils were equal round reactive to light. Extraocular movements were full, visual field were full on confrontational test. Head turning and shoulder  shrug  were normal and symmetric. Motor: The motor testing reveals 5 over 5 strength of all 4 extremities. Good symmetric motor tone is noted throughout.  Sensory: Sensory testing is intact to soft touch on  all 4 extremities. No evidence of extinction is noted.  Coordination: Cerebellar testing reveals good finger-nose-finger and heel-to-shin bilaterally.  Gait and station: Gait is normal.   DIAGNOSTIC DATA (LABS, IMAGING, TESTING) - I reviewed patient records, labs, notes, testing and imaging myself where available.  Lab Results  Component Value Date   WBC 4.5 06/20/2021   HGB 15.4 06/20/2021   HCT 46.5 (H) 06/20/2021   MCV 94.7 06/20/2021   PLT 258 06/20/2021      Component Value Date/Time   NA 140 06/20/2021 0835   K 4.2 06/20/2021 0835   CL 105 06/20/2021 0835   CO2 29 06/20/2021 0835   GLUCOSE 86 06/20/2021 0835   BUN 9 06/20/2021 0835   CREATININE 0.85 06/20/2021 0835   CALCIUM 9.6 06/20/2021 0835   PROT 7.3 06/20/2021 0835   ALBUMIN 3.4 (L) 10/02/2020 1553   AST 20 06/20/2021 0835   ALT 25 06/20/2021 0835   ALKPHOS 113 10/02/2020 1553   BILITOT 0.5 06/20/2021 0835   GFRNONAA >60 10/02/2020 1553   GFRNONAA 103 07/29/2019 1652   GFRAA 119 07/29/2019 1652   Lab Results  Component Value Date   VITAMINB12 531 01/18/2019   Lab Results  Component Value Date   TSH 0.81 01/18/2019      ASSESSMENT AND PLAN 31 y.o. year old female  has a past medical history of Allergic rhinitis, Anxiety, Autoimmune disorder (HCC), COVID (04/05/2020), Frequent headaches, IBS (irritable bowel syndrome), Migraine, and PCOS (polycystic ovarian syndrome). here with:  1.  Migraine headaches  -Stop Emgality -Continue Ubrelvy for abortive therapy sample given today -Toradol 30 mg IM given today for acute headache -Continue Elyxyb for abortive therapy -We will start Qulipta 60 mg for prevention -Follow-up in 3 to 4 months or sooner if needed     Butch Penny, MSN, NP-C 12/12/2021, 2:06 PM [facility] [address] [place], [place] [address] [phone]

## 2021-12-12 NOTE — Progress Notes (Signed)
Toradol 30 mg IM administered in R deltoid per order from MM NP. Pt tolerated well. Bandaid applied. See MAR.

## 2021-12-25 ENCOUNTER — Ambulatory Visit (INDEPENDENT_AMBULATORY_CARE_PROVIDER_SITE_OTHER): Payer: BC Managed Care – PPO | Admitting: Adult Health

## 2021-12-25 ENCOUNTER — Encounter: Payer: Self-pay | Admitting: Adult Health

## 2021-12-25 DIAGNOSIS — G43009 Migraine without aura, not intractable, without status migrainosus: Secondary | ICD-10-CM | POA: Diagnosis not present

## 2021-12-25 MED ORDER — ONABOTULINUMTOXINA 200 UNITS IJ SOLR
155.0000 [IU] | Freq: Once | INTRAMUSCULAR | Status: AC
  Administered 2021-12-25: 155 [IU] via INTRAMUSCULAR

## 2021-12-25 NOTE — Progress Notes (Signed)
12/25/21 JM: Patient returns for repeat Botox injections, use of samples as prior injections 8 weeks ago, continued to have frequent migraines at typical 12 week interval.  Recently started on Qulipta about 1 week ago, reports 1 migraine headache since starting, previously experiencing about 4 per week. She is tolerating well. Tolerated injection well today.     Consent Form Botulism Toxin Injection For Chronic Migraine    Reviewed orally with patient, additionally signature is on file:  Botulism toxin has been approved by the Federal drug administration for treatment of chronic migraine. Botulism toxin does not cure chronic migraine and it may not be effective in some patients.  The administration of botulism toxin is accomplished by injecting a small amount of toxin into the muscles of the neck and head. Dosage must be titrated for each individual. Any benefits resulting from botulism toxin tend to wear off after 3 months with a repeat injection required if benefit is to be maintained. Injections are usually done every 3-4 months with maximum effect peak achieved by about 2 or 3 weeks. Botulism toxin is expensive and you should be sure of what costs you will incur resulting from the injection.  The side effects of botulism toxin use for chronic migraine may include:   -Transient, and usually mild, facial weakness with facial injections  -Transient, and usually mild, head or neck weakness with head/neck injections  -Reduction or loss of forehead facial animation due to forehead muscle weakness  -Eyelid drooping  -Dry eye  -Pain at the site of injection or bruising at the site of injection  -Double vision  -Potential unknown long term risks   Contraindications: You should not have Botox if you are pregnant, nursing, allergic to albumin, have an infection, skin condition, or muscle weakness at the site of the injection, or have myasthenia gravis, Lambert-Eaton syndrome, or ALS.  It  is also possible that as with any injection, there may be an allergic reaction or no effect from the medication. Reduced effectiveness after repeated injections is sometimes seen and rarely infection at the injection site may occur. All care will be taken to prevent these side effects. If therapy is given over a long time, atrophy and wasting in the muscle injected may occur. Occasionally the patient's become refractory to treatment because they develop antibodies to the toxin. In this event, therapy needs to be modified.  I have read the above information and consent to the administration of botulism toxin.    BOTOX PROCEDURE NOTE FOR MIGRAINE HEADACHE  Contraindications and precautions discussed with patient(above). Aseptic procedure was observed and patient tolerated procedure. Procedure performed by Ihor Austin, NP-BC.   The condition has existed for more than 6 months, and pt does not have a diagnosis of ALS, Myasthenia Gravis or Lambert-Eaton Syndrome.  Risks and benefits of injections discussed and pt agrees to proceed with the procedure.  Written consent obtained  These injections are medically necessary. Pt  receives good benefits from these injections. These injections do not cause sedations or hallucinations which the oral therapies may cause.   Description of procedure:  The patient was placed in a sitting position. The standard protocol was used for Botox as follows, with 5 units of Botox injected at each site:  -Procerus muscle, midline injection  -Corrugator muscle, bilateral injection  -Frontalis muscle, bilateral injection, with 2 sites each side, medial injection was performed in the upper one third of the frontalis muscle, in the region vertical from the medial inferior  edge of the superior orbital rim. The lateral injection was again in the upper one third of the forehead vertically above the lateral limbus of the cornea, 1.5 cm lateral to the medial injection  site.  -Temporalis muscle injection, 4 sites, bilaterally. The first injection was 3 cm above the tragus of the ear, second injection site was 1.5 cm to 3 cm up from the first injection site in line with the tragus of the ear. The third injection site was 1.5-3 cm forward between the first 2 injection sites. The fourth injection site was 1.5 cm posterior to the second injection site. 5th site laterally in the temporalis  muscleat the level of the outer canthus.  -Occipitalis muscle injection, 3 sites, bilaterally. The first injection was done one half way between the occipital protuberance and the tip of the mastoid process behind the ear. The second injection site was done lateral and superior to the first, 1 fingerbreadth from the first injection. The third injection site was 1 fingerbreadth superiorly and medially from the first injection site.  -Cervical paraspinal muscle injection, 2 sites, bilaterally. The first injection site was 1 cm from the midline of the cervical spine, 3 cm inferior to the lower border of the occipital protuberance. The second injection site was 1.5 cm superiorly and laterally to the first injection site.  -Trapezius muscle injection was performed at 3 sites, bilaterally. The first injection site was in the upper trapezius muscle halfway between the inflection point of the neck, and the acromion. The second injection site was one half way between the acromion and the first injection site. The third injection was done between the first injection site and the inflection point of the neck.  -Masseters: 2 sites bilaterally, 5 units each side   Will return for repeat injection in 3 months.   A total of 200 units of Botox was prepared, 165 units of Botox was injected as documented above, any Botox not injected was wasted. The patient tolerated the procedure well, there were no complications of the above procedure.

## 2021-12-25 NOTE — Progress Notes (Signed)
Botox- 200 units x 1 vial Lot: [id number]  Expiration: 04/2024 NDC: 1438-8875-79  Bacteriostatic 0.9% Sodium Chloride- 34mL total Lot: [id number]  Expiration: 10/17/2022 NDC: 7282-0601-56  Dx: F53.794 Samples

## 2021-12-26 ENCOUNTER — Other Ambulatory Visit: Payer: Self-pay | Admitting: Physician Assistant

## 2021-12-26 DIAGNOSIS — F411 Generalized anxiety disorder: Secondary | ICD-10-CM

## 2021-12-27 DIAGNOSIS — F4322 Adjustment disorder with anxiety: Secondary | ICD-10-CM | POA: Diagnosis not present

## 2022-01-02 ENCOUNTER — Encounter: Payer: Self-pay | Admitting: Neurology

## 2022-01-02 DIAGNOSIS — F4322 Adjustment disorder with anxiety: Secondary | ICD-10-CM | POA: Diagnosis not present

## 2022-01-02 NOTE — Telephone Encounter (Signed)
I spoke with the patient. She has recently noticed that she has been feeling sad, anxious. Last night after her dose of Qulipta she felt intensely sad. She has noticed some hair loss and decreased appetite, feeling full quickly. She does have a therapist. She is not sure if this all related to anxiety or if this could be from the [place]. I reviewed Micromedex and advised there are no reported psychiatric side effects with Qulipta but there are reports of nausea and constipation. Pt also added that when she received a one time infusion of Depakote she had feelings of depression and suicidal thoughts for 24 hours after and then it went away. She asked if Qulipta and Depakote were two different types of medications which I confirmed they are. Patient verbalized understanding and appreciation. She felt like she needed reassurance.

## 2022-01-02 NOTE — Telephone Encounter (Signed)
Called pt and she was with her class. Will call her back a little later.

## 2022-01-03 ENCOUNTER — Telehealth: Payer: BC Managed Care – PPO | Admitting: Physician Assistant

## 2022-01-08 ENCOUNTER — Telehealth: Payer: Self-pay | Admitting: *Deleted

## 2022-01-08 NOTE — Telephone Encounter (Signed)
That's fine

## 2022-01-08 NOTE — Telephone Encounter (Signed)
Received Lenoria Chime PA and started working on it. BCBS is asking if pt is on Botox concurrently or has received within last 3 months and if so, Botox authorization will be terminated. I did not proceed with submission of [place] PA.

## 2022-01-10 ENCOUNTER — Telehealth (INDEPENDENT_AMBULATORY_CARE_PROVIDER_SITE_OTHER): Payer: BC Managed Care – PPO | Admitting: Physician Assistant

## 2022-01-10 ENCOUNTER — Encounter: Payer: Self-pay | Admitting: Physician Assistant

## 2022-01-10 DIAGNOSIS — F410 Panic disorder [episodic paroxysmal anxiety] without agoraphobia: Secondary | ICD-10-CM | POA: Diagnosis not present

## 2022-01-10 DIAGNOSIS — F4322 Adjustment disorder with anxiety: Secondary | ICD-10-CM | POA: Diagnosis not present

## 2022-01-10 DIAGNOSIS — F419 Anxiety disorder, unspecified: Secondary | ICD-10-CM

## 2022-01-10 DIAGNOSIS — F32A Depression, unspecified: Secondary | ICD-10-CM

## 2022-01-10 MED ORDER — SERTRALINE HCL 25 MG PO TABS: 25.0000 mg | ORAL_TABLET | Freq: Every day | ORAL | 2 refills | Status: DC

## 2022-01-10 NOTE — Progress Notes (Signed)
   Virtual Visit via Video Note  I connected with  Jennifer Vincent  on 01/10/22 at  8:30 AM EDT by a video enabled telemedicine application and verified that I am speaking with the correct person using two identifiers.  Location: Patient: home Provider: Therapist, music at [place] present: Patient and myself   I discussed the limitations of evaluation and management by telemedicine and the availability of in person appointments. The patient expressed understanding and agreed to proceed.   History of Present Illness:  Says on 01/01/22 - felt like she had a panic attack. Intense rush of intrusive thoughts, felt out of control, sadness was spiraling. She didn't even think to take a Xanax rx when she was going through this. Her therapist, [name], was able to see her the next day & is feeling some better. Son is 72 months old. Works at a [employer] started her on [place] early October. States this is definitely helping relieve migraines. Takes birth control continuously to help with her migraines. Noticed some breakthrough bleeding when she had panic episode.     Observations/Objective:   Gen: Awake, alert, no acute distress Resp: Breathing is even and non-labored Psych: calm/pleasant demeanor Neuro: Alert and Oriented x 3, + facial symmetry, speech is clear.   Assessment and Plan:  1. Anxiety and depression She has been doing well with Zoloft for quite some time.  She is comfortable with this medication and feels like an increase might be necessary.  I agree with this.  We can try to increase her Zoloft to 125 mg once daily and see if this helps improve her current symptoms.  She will continue to work with her counselor.  She will continue to work on natural remedies at home.  She knows about 29 and behavioral health urgent care.  2. Panic disorder Patient has Xanax 0.25 mg to take up to twice daily as needed for panic.  Patient is also taking  BuSpar 30 mg twice daily and seems to be doing well with this.  Plan to have close follow-up with her in the next 2 weeks or sooner if needed.   Follow Up Instructions:    I discussed the assessment and treatment plan with the patient. The patient was provided an opportunity to ask questions and all were answered. The patient agreed with the plan and demonstrated an understanding of the instructions.   The patient was advised to call back or seek an in-person evaluation if the symptoms worsen or if the condition fails to improve as anticipated.  Jahzir Strohmeier M Ronnisha Felber, PA-C

## 2022-01-13 ENCOUNTER — Other Ambulatory Visit: Payer: Self-pay | Admitting: Physician Assistant

## 2022-01-13 DIAGNOSIS — M359 Systemic involvement of connective tissue, unspecified: Secondary | ICD-10-CM

## 2022-01-15 ENCOUNTER — Other Ambulatory Visit: Payer: Self-pay | Admitting: Physician Assistant

## 2022-01-15 ENCOUNTER — Ambulatory Visit: Payer: BC Managed Care – PPO | Admitting: Physician Assistant

## 2022-01-16 ENCOUNTER — Encounter: Payer: Self-pay | Admitting: Neurology

## 2022-01-16 NOTE — Telephone Encounter (Signed)
Last OV; 01/10/22  Next OV: 01/22/22-Canceled  Last filled: 09/19/21  Quantity: 6 w/ 0 refills

## 2022-01-17 DIAGNOSIS — F4322 Adjustment disorder with anxiety: Secondary | ICD-10-CM | POA: Diagnosis not present

## 2022-01-21 ENCOUNTER — Other Ambulatory Visit: Payer: Self-pay | Admitting: Rheumatology

## 2022-01-21 DIAGNOSIS — M359 Systemic involvement of connective tissue, unspecified: Secondary | ICD-10-CM

## 2022-01-21 MED ORDER — HYDROXYCHLOROQUINE SULFATE 200 MG PO TABS: 200.0000 mg | ORAL_TABLET | Freq: Two times a day (BID) | ORAL | 0 refills | Status: DC

## 2022-01-21 NOTE — Telephone Encounter (Signed)
Next Visit: 01/30/2022   Last Visit: 06/20/2021   Labs: 06/20/2021 CBC stable.  Alk phos is borderline low.  Rest of CMP WNL.  LFTS have returned to WNL. ESR WNL.  Complements WNL. UA normal. dsDNA is negative.  Labs are not consistent with a flare at this time.    Eye exam: 09/20/2021 normal    Current Dose per office note 06/20/2021: Plaquenil 200 mg 1 tablet by mouth twice daily   [id number] disease   Last Fill: 10/15/2021   Okay to refill Plaquenil?

## 2022-01-21 NOTE — Telephone Encounter (Signed)
Patient called requesting prescription refill of Hydroxychloroquine to be sent to [facility] at [address]

## 2022-01-21 NOTE — Progress Notes (Unsigned)
 Office Visit Note  Patient: Jennifer Vincent             Date of Birth: 03/03/1991           MRN: [id number]             PCP: Allwardt, Alyssa M, PA-C Referring: Koberlein, Junell C, MD Visit Date: 01/30/2022 Occupation: @GUAROCC@  Subjective:  Increased pain in both hands  History of Present Illness: Jennifer Vincent is a 31 y.o. female with history of autoimmune disease.  She is taking Plaquenil 200 mg 1 tablet by mouth twice daily.  Patient reports that she recently missed 2 to 3 days of Plaquenil due to requiring a refill.  She states she has had some increased pain in her left elbow and both hands especially in the PIP joints.  She states she can feel stiffness but does not see visible swelling at this time.  She has been trying to avoid taking over-the-counter products for pain relief but occasionally has to take Motrin at bedtime to help her sleep.  She continues to have fatigue which has been slightly worse over the past several weeks.  She states that for management of migraine she was started on qulipta and she is unsure if her increased fatigue, joint pain, and hair loss are a side effect or due to underlying autoimmune disease. She denies any recent rashes, Raynaud's phenomenon, fever or swollen lymph nodes, oral or nasal ulcerations.  She continues to have chronic dry eyes but no mouth dryness.  Activities of Daily Living:  Patient reports morning stiffness for 0 minutes.   Patient Reports nocturnal pain.  Difficulty dressing/grooming: Denies Difficulty climbing stairs: Denies Difficulty getting out of chair: Denies Difficulty using hands for taps, buttons, cutlery, and/or writing: Reports  Review of Systems  Constitutional:  Positive for fatigue.  HENT:  Negative for mouth sores and mouth dryness.   Eyes:  Positive for dryness.  Respiratory:  Negative for shortness of breath.   Cardiovascular:  Negative for chest pain and palpitations.  Gastrointestinal:  Negative for blood in  stool, constipation and diarrhea.  Endocrine: Negative for increased urination.  Genitourinary:  Negative for involuntary urination.  Musculoskeletal:  Positive for joint pain and joint pain. Negative for gait problem, joint swelling, myalgias, muscle weakness, morning stiffness, muscle tenderness and myalgias.  Skin:  Positive for hair loss. Negative for color change, rash and sensitivity to sunlight.  Allergic/Immunologic: Negative for susceptible to infections.  Neurological:  Positive for headaches. Negative for dizziness.  Hematological:  Negative for swollen glands.  Psychiatric/Behavioral:  Positive for sleep disturbance. Negative for depressed mood. The patient is not nervous/anxious.     PMFS History:  Patient Active Problem List   Diagnosis Date Noted   Antiphospholipid antibody syndrome (HCC) 10/02/2020   Chronic migraine without aura, with intractable migraine, so stated, with status migrainosus 06/01/2019   Generalized anxiety disorder 01/11/2018   Panic disorder 10/04/2016   Anxiety and depression 10/04/2016    Past Medical History:  Diagnosis Date   Allergic rhinitis    Anxiety    Autoimmune disorder (HCC)    nonspecified   COVID 04/05/2020   Frequent headaches    IBS (irritable bowel syndrome)    Migraine    PCOS (polycystic ovarian syndrome)     Family History  Problem Relation Age of Onset   Depression Mother    Eating disorder Mother    Arthritis Father    Crohn's disease Father    Ulcerative colitis   Father    Arthritis Brother    Alcohol abuse Brother    Mental illness Brother    Depression Brother    Anxiety disorder Brother    Crohn's disease Brother    Migraines Maternal Uncle    Breast cancer Maternal Grandmother    Mental illness Maternal Grandfather    Anxiety disorder Maternal Grandfather    OCD Maternal Grandfather    Heart disease Paternal Grandfather    Diabetes Paternal Grandfather    Heart attack Paternal Grandfather    Healthy  Daughter    Healthy Son    Past Surgical History:  Procedure Laterality Date   TONSILLECTOMY     Social History   Social History Narrative   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      Home Situation: lives with husband and daughter      Spiritual Beliefs: Jewish      Lifestyle:  regular, aerobic and orange theory; diet healthy       Update: uses peloton 5x a week      Right handed      Caffeine: 1 cup/day   Immunization History  Administered Date(s) Administered   Influenza,inj,Quad PF,6+ Mos 12/06/2021   Influenza-Unspecified 12/16/2016, 12/25/2017, 12/16/2018, 01/16/2021   PFIZER(Purple Top)SARS-COV-2 Vaccination 05/13/2019, 06/02/2019, 02/16/2020   PPD Test 10/13/2014     Objective: Vital Signs: BP 128/88 (BP Location: Left Arm, Patient Position: Sitting, Cuff Size: Normal)   Pulse 91   Resp 14   Ht 5' 8" (1.727 m)   Wt 205 lb (93 kg)   BMI 31.17 kg/m    Physical Exam Vitals and nursing note reviewed.  Constitutional:      Appearance: She is well-developed.  HENT:     Head: Normocephalic and atraumatic.  Eyes:     Conjunctiva/sclera: Conjunctivae normal.  Cardiovascular:     Rate and Rhythm: Normal rate and regular rhythm.     Heart sounds: Normal heart sounds.  Pulmonary:     Effort: Pulmonary effort is normal.     Breath sounds: Normal breath sounds.  Abdominal:     General: Bowel sounds are normal.     Palpations: Abdomen is soft.  Musculoskeletal:     Cervical back: Normal range of motion.  Skin:    General: Skin is warm and dry.     Capillary Refill: Capillary refill takes less than 2 seconds.  Neurological:     Mental Status: She is alert and oriented to person, place, and time.  Psychiatric:        Behavior: Behavior normal.      Musculoskeletal Exam: C-spine, thoracic spine, lumbar spine have good range of motion with no discomfort.  Shoulder joints have good range of motion.  Some tenderness over the lateral epicondyle of the left  elbow.  Tenderness over all PIP joints but no synovitis noted.  Complete fist formation bilaterally.  Hip joints have good range of motion with no groin pain.  Both knee joints have good range of motion with no warmth or effusion.  Ankle joints have good range of motion with no tenderness or joint swelling.  No tenderness or synovitis over MTP joints.  CDAI Exam: CDAI Score: -- Patient Global: --; Provider Global: -- Swollen: --; Tender: -- Joint Exam 01/30/2022   No joint exam has been documented for this visit   There is currently no information documented on the homunculus. Go to the Rheumatology activity and complete the homunculus joint exam.  Investigation: No additional   findings.  Imaging: No results found.  Recent Labs: Lab Results  Component Value Date   WBC 4.5 06/20/2021   HGB 15.4 06/20/2021   PLT 258 06/20/2021   NA 140 06/20/2021   K 4.2 06/20/2021   CL 105 06/20/2021   CO2 29 06/20/2021   GLUCOSE 86 06/20/2021   BUN 9 06/20/2021   CREATININE 0.85 06/20/2021   BILITOT 0.5 06/20/2021   ALKPHOS 113 10/02/2020   AST 20 06/20/2021   ALT 25 06/20/2021   PROT 7.3 06/20/2021   ALBUMIN 3.4 (L) 10/02/2020   CALCIUM 9.6 06/20/2021   GFRAA 119 07/29/2019   QFTBGOLDPLUS NEGATIVE 07/29/2019    Speciality Comments: PLQ eye exam: 09/20/2021 WNL. [facility]. Follow up in 1 year.  Procedures:  No procedures performed Allergies: Depakote [divalproex sodium] and Lactose intolerance (gi)      Assessment / Plan:     Visit Diagnoses: Autoimmune disease (HCC) - Positive ANA, positive SCL 70, aCL 42, fatigue, inflammatory arthritis (pictures noted cell phone showed MCP, left ankle joint swelling and rash in the past): Patient is currently taking Plaquenil 200 mg 1 tablet by mouth twice daily.  She is tolerating Plaquenil without any side effects.  She recently missed 2 to 3 days of Plaquenil and has had some increased fatigue and joint pain since then.  On examination she  has tenderness over the PIP joints but no synovitis was noted.  She was able to make a complete fist bilaterally.  She has not had any recent rashes, alopecia, Raynaud's phenomenon, oral or nasal ulcerations, cervical lymphadenopathy, or recent fevers.  She has chronic dry eyes but no dry mouth.  Overall the rest of her symptoms have been stable on the current treatment regimen. Autoimmune lab work from 06/20/21 was reviewed today in the office: dsDNA negative, Complements WNL, and ESR WNL.  The following lab work will be obtained today for further evaluation.   She will remain on Plaquenil as prescribed.  She was advised to notify us if she develops increased joint pain or joint swelling.  She will follow-up in the office in 5 months or sooner if needed. - Plan: CBC with Differential/Platelet, COMPLETE METABOLIC PANEL WITH GFR, Urinalysis, Routine w reflex microscopic, Anti-DNA antibody, double-stranded, C3 and C4, Sedimentation rate, hydroxychloroquine (PLAQUENIL) 200 MG tablet  High risk medication use - Plaquenil 200 mg 1 tablet by mouth twice daily.  PLQ eye exam: 09/20/2021 WNL. [facility]. Follow up in 1 year.  CBC and CMP updated on 06/20/21.  Orders for CBC and CMP were released today.  - Plan: CBC with Differential/Platelet, COMPLETE METABOLIC PANEL WITH GFR  Anticardiolipin antibody positive - Anticardiolipin antibodies were negative on 11/28/2020.  Anticardiolipin antibodies were rechecked today.- Plan: Cardiolipin antibodies, IgG, IgM, IgA  Pain in both hands - Ultrasound of both hands did not show any synovitis or tenosynovitis on 02/28/2021.  She has been experiencing increased pain and stiffness in both hands especially in the PIP joints.  On examination today no obvious synovitis was noted.  Sed rate will be checked today with routine lab work.  She will remain on Plaquenil as prescribed.  Discussed the importance of joint protection and muscle strengthening.  She was encouraged to notify us  if she develops increased joint pain or joint swelling at which time we can schedule repeat ultrasound in the future if needed.  Pain of both elbows: She continues to have some tenderness over the lateral epicondyle of both elbows.  No   elbow joint line tenderness or inflammation noted.  No evidence of olecranon bursitis.  Chondromalacia of both patellae: She has good range of motion of both knee joints on examination today.  No warmth or effusion noted.  Vitamin D deficiency -Vitamin D was 29 on 11/28/2020.  Vitamin D level will be rechecked today.  Plan: VITAMIN D 25 Hydroxy (Vit-D Deficiency, Fractures)  Other medical conditions are listed as follows:  Generalized anxiety disorder  History of migraine: Started on Qulipta in October 2023.  Panic disorder  History of COVID-19 - January 2022 and January 2023.  History of shingles - January 2023.  Resolved.  History of IBS  Family history of Crohn's disease  Family history of ulcerative colitis    Orders: Orders Placed This Encounter  Procedures   CBC with Differential/Platelet   COMPLETE METABOLIC PANEL WITH GFR   Urinalysis, Routine w reflex microscopic   Anti-DNA antibody, double-stranded   C3 and C4   Sedimentation rate   VITAMIN D 25 Hydroxy (Vit-D Deficiency, Fractures)   Cardiolipin antibodies, IgG, IgM, IgA   Meds ordered this encounter  Medications   hydroxychloroquine (PLAQUENIL) 200 MG tablet    Sig: Take 1 tablet (200 mg total) by mouth 2 (two) times daily.    Dispense:  180 tablet    Refill:  0    Follow-Up Instructions: Return in about 5 months (around 07/01/2022) for Autoimmune Disease.   Ofilia Neas, PA-C  Note - This record has been created using Dragon software.  Chart creation errors have been sought, but may not always  have been located. Such creation errors do not reflect on  the standard of medical care.

## 2022-01-22 ENCOUNTER — Ambulatory Visit: Payer: BC Managed Care – PPO | Admitting: Physician Assistant

## 2022-01-23 ENCOUNTER — Telehealth: Payer: BC Managed Care – PPO | Admitting: Neurology

## 2022-01-23 ENCOUNTER — Telehealth (INDEPENDENT_AMBULATORY_CARE_PROVIDER_SITE_OTHER): Payer: BC Managed Care – PPO | Admitting: Neurology

## 2022-01-23 DIAGNOSIS — G43009 Migraine without aura, not intractable, without status migrainosus: Secondary | ICD-10-CM

## 2022-01-23 DIAGNOSIS — G43711 Chronic migraine without aura, intractable, with status migrainosus: Secondary | ICD-10-CM | POA: Diagnosis not present

## 2022-01-23 NOTE — Progress Notes (Deleted)
PATIENT: Jennifer Vincent DOB: 09/16/1990  REASON FOR VISIT: follow up HISTORY FROM: patient PRIMARY NEUROLOGIST: Dr. Lucia Gaskins  HISTORY OF PRESENT ILLNESS: Today 01/23/22:  Jennifer Vincent is a 31 year old female with a history of migraines.  She returns today for follow-up.  She states that she has had an ongoing headache for several days.  Reports that she has been on Emgality for 5 months: best month was July only 7 migraine days.  She was also on Wegovy that month.  But has not been able to get the medication since then.  In Past month: 12 migraine days. 6 were severe that needed multiple rescue medications. Using Ubrelvy and Elyyxyb. Headache doesn't go away but allows her to be able to function. 8/31 got Toradol injection. Took emgality 9/22.  Botox is due in 2 weeks   Jennifer Vincent is a 31 year old female with a history of migraine headaches.  She returns today for follow-up.  She states that since her last pregnancy Ajovy has not been working well for her.  She has greater than 20 headache days a month. Has had a headache since last Tuesday- able to work through it because she has not other choice.  We offered a Toradol shot last week but she was unable to get childcare to come in.  Amerge didn't help she gets more relief from [place]. What helps the most is taking ubrelvy followed by elxyb. Rheumatologiist has cleared her to take this. Last took Elxyb Monday.  Reports that she typically wakes up with a dull headache.  The headache typically gets worse as the day progresses.  Denies any changes with her vision.  Denies muffled hearing.  She does have an ophthalmologist that she sees.  Taking Ajovy monthly and Botox every 12 weeks.     HISTORY Jennifer Vincent is a 31 year old female with a history of migraine headaches.  She returns today for virtual visit.  The patient reports that she has been having a lot of breakthrough headaches.  This originally started after having COVID.  She also had a breakthrough  menstrual cycle and in the past her menstrual cycles have been a big trigger for her migraines.  Also the barometric pressure has been a trigger.  She did a Medrol Dosepak last week and did not have a headache Thursday Friday Saturday or Sunday after taking the medication.  Yesterday she developed a migraine and used her [name] this morning she also woke up with a headache and is already taking [place].  She states that the [place] does not resolve her headaches but typically just takes the edge off.  She has tried triptans in the past but she felt that it caused rebound headaches however this was prior to her being on any preventative medication.  She has tried Nurtec in the past but did not find it beneficial.  She tried Elyxyb and felt that it was helpful however her rheumatologist instructed her not to take NSAIDs due to elevated liver enzymes.  She goes back next month for repeat blood work.    REVIEW OF SYSTEMS: Out of a complete 14 system review of symptoms, the patient complains only of the following symptoms, and all other reviewed systems are negative.  ALLERGIES: Allergies  Allergen Reactions   Depakote [Divalproex Sodium]     Depression/suicidal thoughts   Lactose Intolerance (Gi)     GI upset    HOME MEDICATIONS: Outpatient Medications Prior to Visit  Medication Sig Dispense Refill  ALPRAZolam (XANAX) 0.25 MG tablet TAKE 1 TABLET(0.25 MG) BY MOUTH TWICE DAILY AS NEEDED FOR ANXIETY 6 tablet 0   Atogepant (QULIPTA) 60 MG TABS Take 60 mg by mouth daily. 30 tablet 11   busPIRone (BUSPAR) 15 MG tablet TAKE 2 TABLETS(30 MG) BY MOUTH TWICE DAILY 360 tablet 0   Celecoxib (ELYXYB) 120 MG/4.8ML SOLN Take 120 mg by mouth daily as needed. One dose maximum daily. 28.8 mL 11   hydroxychloroquine (PLAQUENIL) 200 MG tablet Take 1 tablet (200 mg total) by mouth 2 (two) times daily. 60 tablet 0   JUNEL FE 1/20 1-20 MG-MCG tablet Take 1 tablet by mouth daily.     ondansetron (ZOFRAN) 4 MG tablet  Take 1 tablet (4 mg total) by mouth every 8 (eight) hours as needed for nausea or vomiting. 20 tablet 0   Prenatal Vit-Fe Fumarate-FA (PRENATAL MULTIVITAMIN) TABS tablet Take 1 tablet by mouth daily at 12 noon.     sertraline (ZOLOFT) 100 MG tablet Take 1 tablet (100 mg total) by mouth daily. 90 tablet 2   sertraline (ZOLOFT) 25 MG tablet Take 1 tablet (25 mg total) by mouth daily. Take with the 100 mg dose to total 125 mg daily. 30 tablet 2   Ubrogepant (UBRELVY) 100 MG TABS Take 1 tablet onset migraine, may take another 2 hours later if needed. 16 tablet 5   Zavegepant HCl (ZAVZPRET) 10 MG/ACT SOLN Place 1 spray into the nose daily as needed. Administer as soon as possible at onset of migraine. 8 each 11   Facility-Administered Medications Prior to Visit  Medication Dose Route Frequency Provider Last Rate Last Admin   botulinum toxin Type A (BOTOX) injection 155 Units  155 Units Intramuscular Once Anson Fret, MD        PAST MEDICAL HISTORY: Past Medical History:  Diagnosis Date   Allergic rhinitis    Anxiety    Autoimmune disorder (HCC)    nonspecified   COVID 04/05/2020   Frequent headaches    IBS (irritable bowel syndrome)    Migraine    PCOS (polycystic ovarian syndrome)     PAST SURGICAL HISTORY: Past Surgical History:  Procedure Laterality Date   TONSILLECTOMY      FAMILY HISTORY: Family History  Problem Relation Age of Onset   Depression Mother    Eating disorder Mother    Arthritis Father    Crohn's disease Father    Ulcerative colitis Father    Arthritis Brother    Alcohol abuse Brother    Mental illness Brother    Depression Brother    Anxiety disorder Brother    Crohn's disease Brother    Migraines Maternal Uncle    Breast cancer Maternal Grandmother    Mental illness Maternal Grandfather    Anxiety disorder Maternal Grandfather    OCD Maternal Grandfather    Heart disease Paternal Grandfather    Diabetes Paternal Grandfather    Heart attack  Paternal Grandfather    Healthy Daughter    Healthy Son     SOCIAL HISTORY: Social History   Socioeconomic History   Marital status: Married    Spouse name: Not on file   Number of children: 1   Years of education: Not on file   Highest education level: Master's degree (e.g., MA, MS, MEng, MEd, MSW, MBA)  Occupational History   Not on file  Tobacco Use   Smoking status: Never   Smokeless tobacco: Never  Vaping Use   Vaping Use: Never used  Substance and Sexual Activity   Alcohol use: Not Currently   Drug use: Never   Sexual activity: Yes    Birth control/protection: None  Other Topics Concern   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      Home Situation: lives with husband and daughter      Spiritual Beliefs: Jewish      Lifestyle:  regular, aerobic and orange theory; diet healthy       Update: uses peloton 5x a week      Right handed      Caffeine: 1 cup/day   Social Determinants of Health   Financial Resource Strain: Low Risk  (05/04/2021)   Overall Financial Resource Strain (CARDIA)    Difficulty of Paying Living Expenses: Not hard at all  Food Insecurity: No Food Insecurity (05/04/2021)   Hunger Vital Sign    Worried About Running Out of Food in the Last Year: Never true    Ran Out of Food in the Last Year: Never true  Transportation Needs: No Transportation Needs (05/04/2021)   PRAPARE - Administrator, Civil Service (Medical): No    Lack of Transportation (Non-Medical): No  Physical Activity: Insufficiently Active (05/04/2021)   Exercise Vital Sign    Days of Exercise per Week: 4 days    Minutes of Exercise per Session: 30 min  Stress: No Stress Concern Present (05/04/2021)   [employer] of Occupational Health - Occupational Stress Questionnaire    Feeling of Stress : Only a little  Social Connections: Socially Integrated (05/04/2021)   Social Connection and Isolation Panel [NHANES]     Frequency of Communication with Friends and Family: More than three times a week    Frequency of Social Gatherings with Friends and Family: More than three times a week    Attends Religious Services: 1 to 4 times per year    Active Member of [employer] or Organizations: Yes    Attends Engineer, structural: More than 4 times per year    Marital Status: Married  Catering manager Violence: Not on file      PHYSICAL EXAM  There were no vitals filed for this visit.  There is no height or weight on file to calculate BMI.  Generalized: Well developed, in no acute distress   Neurological examination  Mentation: Alert oriented to time, place, history taking. Follows all commands speech and language fluent Cranial nerve II-XII: Pupils were equal round reactive to light. Extraocular movements were full, visual field were full on confrontational test. Head turning and shoulder shrug  were normal and symmetric. Motor: The motor testing reveals 5 over 5 strength of all 4 extremities. Good symmetric motor tone is noted throughout.  Sensory: Sensory testing is intact to soft touch on all 4 extremities. No evidence of extinction is noted.  Coordination: Cerebellar testing reveals good finger-nose-finger and heel-to-shin bilaterally.  Gait and station: Gait is normal.   DIAGNOSTIC DATA (LABS, IMAGING, TESTING) - I reviewed patient records, labs, notes, testing and imaging myself where available.  Lab Results  Component Value Date   WBC 4.5 06/20/2021   HGB 15.4 06/20/2021   HCT 46.5 (H) 06/20/2021   MCV 94.7 06/20/2021   PLT 258 06/20/2021      Component Value Date/Time   NA 140 06/20/2021 0835   K 4.2 06/20/2021 0835   CL 105 06/20/2021 0835   CO2 29 06/20/2021 0835   GLUCOSE 86 06/20/2021  0835   BUN 9 06/20/2021 0835   CREATININE 0.85 06/20/2021 0835   CALCIUM 9.6 06/20/2021 0835   PROT 7.3 06/20/2021 0835   ALBUMIN 3.4 (L) 10/02/2020 1553   AST 20 06/20/2021 0835   ALT 25  06/20/2021 0835   ALKPHOS 113 10/02/2020 1553   BILITOT 0.5 06/20/2021 0835   GFRNONAA >60 10/02/2020 1553   GFRNONAA 103 07/29/2019 1652   GFRAA 119 07/29/2019 1652   Lab Results  Component Value Date   VITAMINB12 531 01/18/2019   Lab Results  Component Value Date   TSH 0.81 01/18/2019      ASSESSMENT AND PLAN 31 y.o. year old female  has a past medical history of Allergic rhinitis, Anxiety, Autoimmune disorder (HCC), COVID (04/05/2020), Frequent headaches, IBS (irritable bowel syndrome), Migraine, and PCOS (polycystic ovarian syndrome). here with:  1.  Migraine headaches  -Stop Emgality -Continue Ubrelvy for abortive therapy sample given today -Toradol 30 mg IM given today for acute headache -Continue Elyxyb for abortive therapy -We will start Qulipta 60 mg for prevention -Follow-up in 3 to 4 months or sooner if needed     Butch Penny, MSN, NP-C 01/23/2022, 7:29 AM [facility] [address] [place], [place] [address] [phone]

## 2022-01-23 NOTE — Patient Instructions (Signed)
1.  Migraine headaches  - Started qulipta 12/19/2021: she has noticed side effects but she is feeling better, the past week she is getting results when she takes the [place] she gets relief and when she was on the monthly injectables even the [name] was not working. It is giving her GI side effects, nausea, constipation on the qulipta. It's manageable. She feels like she lost hair with the qulipta. She is not seeing an improvement in the amount of days but not as severe the migraine severity is significantly improved and it has been a month, will continue 3 months.   - Emgality did not work and wore off, Ajovy stopped working, Aimovig contraindicated due to constipation.   - The zavzpret did not help at all. Only one severe migraine since starting the qulipta.She is getting results acutely with ubrelvy and also the Elyxyb has helped as well.   - give qulipta 3 months. If ineffective think about vyepti.   - If the qulipta is really helping will try for 3 months, We could also decrease dose of qulipta and see if that helps with the side effects in 3 months  - continue magnesium twice a day and also try stool softener and increase fluids  -Continue Ubrelvy  -Continue Elyxyb for abortive therapy  - they may not cover qulipta and botox, if continues to get better would move the botox out a month and see if still feeling better and if doing well stay on qulipta.  if she feels like needs both we may have to find samples and write a letter to insurance to try to get qulipta and botox approved or try Vyepti.

## 2022-01-23 NOTE — Progress Notes (Signed)
PATIENT: Jennifer Vincent DOB: 16-Nov-1990  REASON FOR VISIT: follow up HISTORY FROM: patient PRIMARY NEUROLOGIST: Dr. Lucia Gaskins  Virtual Visit via Video Note  I connected with Jennifer Vincent on 01/23/22 at  1:00 PM EST by a video enabled telemedicine application and verified that I am speaking with the correct person using two identifiers.  Location: Patient: home Provider: office   I discussed the limitations of evaluation and management by telemedicine and the availability of in person appointments. The patient expressed understanding and agreed to proceed.  Follow Up Instructions:    I discussed the assessment and treatment plan with the patient. The patient was provided an opportunity to ask questions and all were answered. The patient agreed with the plan and demonstrated an understanding of the instructions.   The patient was advised to call back or seek an in-person evaluation if the symptoms worsen or if the condition fails to improve as anticipated.  I provided 30 minutes of non-face-to-face time during this encounter.   Anson Fret, MD   HISTORY OF PRESENT ILLNESS:  Today 01/23/2022: Jennifer Vincent 12/19/2022: she has noticed side effects but she is feeling better, the past week she is getting results when she takes the [place] she gets relief and when she was on the monthly injectables even the [name] was not working. It is giving her GI side effects, nausea, constipation on the qulipta. It's manageable. She feels like she lost hair with the qulipta. She is not seeing an improvement in the amount of days but not as severe. Emgality did not work and wore off, Ajovy stopped working, Aimovig contraindicated due to constipation. She has had a lot of stress and maybe things will settle. The zavzpret did not help at all. Only one severe migraine since starting the qulipta.She is getting results acutely with ubrelvy and also the Elyxyb has helped as well. We discussed vyepti. We could  also decrease dose of qulipta.    08/22/2021: Normal MRI scan of the brain with and without contrast.   Patient complains of symptoms per HPI as well as the following symptoms: constipation . Pertinent negatives and positives per HPI. All others negative    Today 11/22/2021:  Jennifer Vincent is a 31 year old female with a history of migraines.  She returns today for follow-up.  She states that she has had an ongoing headache for several days.  Reports that she has been on Emgality for 5 months: best month was July only 7 migraine days.  She was also on Wegovy that month.  But has not been able to get the medication since then.  In Past month: 12 migraine days. 6 were severe that needed multiple rescue medications. Using Ubrelvy and Elyyxyb. Headache doesn't go away but allows her to be able to function. 8/31 got Toradol injection. Took emgality 9/22.  Botox is due in 2 weeks   Jennifer Vincent is a 31 year old female with a history of migraine headaches.  She returns today for follow-up.  She states that since her last pregnancy Ajovy has not been working well for her.  She has greater than 20 headache days a month. Has had a headache since last Tuesday- able to work through it because she has not other choice.  We offered a Toradol shot last week but she was unable to get childcare to come in.  Amerge didn't help she gets more relief from [place]. What helps the most is taking ubrelvy followed by elxyb. Rheumatologiist has cleared  her to take this. Last took Elxyb Monday.  Reports that she typically wakes up with a dull headache.  The headache typically gets worse as the day progresses.  Denies any changes with her vision.  Denies muffled hearing.  She does have an ophthalmologist that she sees.  Taking Ajovy monthly and Botox every 12 weeks.     HISTORY Jennifer Vincent is a 31 year old female with a history of migraine headaches.  She returns today for virtual visit.  The patient reports that she has been having a lot of  breakthrough headaches.  This originally started after having COVID.  She also had a breakthrough menstrual cycle and in the past her menstrual cycles have been a big trigger for her migraines.  Also the barometric pressure has been a trigger.  She did a Medrol Dosepak last week and did not have a headache Thursday Friday Saturday or Sunday after taking the medication.  Yesterday she developed a migraine and used her Bernita RaisinUbrelvy this morning she also woke up with a headache and is already taking [place]relvy.  She states that the ViolaUbrelvy does not resolve her headaches but typically just takes the edge off.  She has tried triptans in the past but she felt that it caused rebound headaches however this was prior to her being on any preventative medication.  She has tried Nurtec in the past but did not find it beneficial.  She tried Elyxyb and felt that it was helpful however her rheumatologist instructed her not to take NSAIDs due to elevated liver enzymes.  She goes back next month for repeat blood work.    REVIEW OF SYSTEMS: Out of a complete 14 system review of symptoms, the patient complains only of the following symptoms, and all other reviewed systems are negative.  ALLERGIES: Allergies  Allergen Reactions   Depakote [Divalproex Sodium]     Depression/suicidal thoughts   Lactose Intolerance (Gi)     GI upset    HOME MEDICATIONS: Outpatient Medications Prior to Visit  Medication Sig Dispense Refill   ALPRAZolam (XANAX) 0.25 MG tablet TAKE 1 TABLET(0.25 MG) BY MOUTH TWICE DAILY AS NEEDED FOR ANXIETY 6 tablet 0   Atogepant (QULIPTA) 60 MG TABS Take 60 mg by mouth daily. 30 tablet 11   busPIRone (BUSPAR) 15 MG tablet TAKE 2 TABLETS(30 MG) BY MOUTH TWICE DAILY 360 tablet 0   Celecoxib (ELYXYB) 120 MG/4.8ML SOLN Take 120 mg by mouth daily as needed. One dose maximum daily. 28.8 mL 11   hydroxychloroquine (PLAQUENIL) 200 MG tablet Take 1 tablet (200 mg total) by mouth 2 (two) times daily. 60 tablet 0    JUNEL FE 1/20 1-20 MG-MCG tablet Take 1 tablet by mouth daily.     ondansetron (ZOFRAN) 4 MG tablet Take 1 tablet (4 mg total) by mouth every 8 (eight) hours as needed for nausea or vomiting. 20 tablet 0   Prenatal Vit-Fe Fumarate-FA (PRENATAL MULTIVITAMIN) TABS tablet Take 1 tablet by mouth daily at 12 noon.     sertraline (ZOLOFT) 100 MG tablet Take 1 tablet (100 mg total) by mouth daily. 90 tablet 2   sertraline (ZOLOFT) 25 MG tablet Take 1 tablet (25 mg total) by mouth daily. Take with the 100 mg dose to total 125 mg daily. 30 tablet 2   Ubrogepant (UBRELVY) 100 MG TABS Take 1 tablet onset migraine, may take another 2 hours later if needed. 16 tablet 5   Zavegepant HCl (ZAVZPRET) 10 MG/ACT SOLN Place 1 spray into the nose  daily as needed. Administer as soon as possible at onset of migraine. 8 each 11   Facility-Administered Medications Prior to Visit  Medication Dose Route Frequency Provider Last Rate Last Admin   botulinum toxin Type A (BOTOX) injection 155 Units  155 Units Intramuscular Once Anson Fret, MD        PAST MEDICAL HISTORY: Past Medical History:  Diagnosis Date   Allergic rhinitis    Anxiety    Autoimmune disorder (HCC)    nonspecified   COVID 04/05/2020   Frequent headaches    IBS (irritable bowel syndrome)    Migraine    PCOS (polycystic ovarian syndrome)     PAST SURGICAL HISTORY: Past Surgical History:  Procedure Laterality Date   TONSILLECTOMY      FAMILY HISTORY: Family History  Problem Relation Age of Onset   Depression Mother    Eating disorder Mother    Arthritis Father    Crohn's disease Father    Ulcerative colitis Father    Arthritis Brother    Alcohol abuse Brother    Mental illness Brother    Depression Brother    Anxiety disorder Brother    Crohn's disease Brother    Migraines Maternal Uncle    Breast cancer Maternal Grandmother    Mental illness Maternal Grandfather    Anxiety disorder Maternal Grandfather    OCD Maternal  Grandfather    Heart disease Paternal Grandfather    Diabetes Paternal Grandfather    Heart attack Paternal Grandfather    Healthy Daughter    Healthy Son     SOCIAL HISTORY: Social History   Socioeconomic History   Marital status: Married    Spouse name: Not on file   Number of children: 1   Years of education: Not on file   Highest education level: Master's degree (e.g., MA, MS, MEng, MEd, MSW, MBA)  Occupational History   Not on file  Tobacco Use   Smoking status: Never   Smokeless tobacco: Never  Vaping Use   Vaping Use: Never used  Substance and Sexual Activity   Alcohol use: Not Currently   Drug use: Never   Sexual activity: Yes    Birth control/protection: None  Other Topics Concern   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      Home Situation: lives with husband and daughter      Spiritual Beliefs: Jewish      Lifestyle:  regular, aerobic and orange theory; diet healthy       Update: uses peloton 5x a week      Right handed      Caffeine: 1 cup/day   Social Determinants of Health   Financial Resource Strain: Low Risk  (05/04/2021)   Overall Financial Resource Strain (CARDIA)    Difficulty of Paying Living Expenses: Not hard at all  Food Insecurity: No Food Insecurity (05/04/2021)   Hunger Vital Sign    Worried About Running Out of Food in the Last Year: Never true    Ran Out of Food in the Last Year: Never true  Transportation Needs: No Transportation Needs (05/04/2021)   PRAPARE - Administrator, Civil Service (Medical): No    Lack of Transportation (Non-Medical): No  Physical Activity: Insufficiently Active (05/04/2021)   Exercise Vital Sign    Days of Exercise per Week: 4 days    Minutes of Exercise per Session: 30 min  Stress: No Stress Concern Present (  05/04/2021)   [place] [employer] - Occupational Stress Questionnaire    Feeling of Stress : Only a little  Social  Connections: Socially Integrated (05/04/2021)   Social Connection and Isolation Panel [NHANES]    Frequency of Communication with Friends and Family: More than three times a week    Frequency of Social Gatherings with Friends and Family: More than three times a week    Attends Religious Services: 1 to 4 times per year    Active Member of [employer] or Organizations: Yes    Attends Engineer, structural: More than 4 times per year    Marital Status: Married  Catering manager Violence: Not on file      PHYSICAL EXAM  There were no vitals filed for this visit.  There is no height or weight on file to calculate BMI.   Physical exam: Exam: Gen: NAD, conversant      CV:  Denies palpitations or chest pain or SOB. VS: Breathing at a normal rate. Weight appears obese Not febrile. Eyes: Conjunctivae clear without exudates or hemorrhage  Neuro: Detailed Neurologic Exam  Speech:    Speech is normal; fluent and spontaneous with normal comprehension.  Cognition:    The patient is oriented to person, place, and time;     recent and remote memory intact;     language fluent;     normal attention, concentration,     fund of knowledge Cranial Nerves:    The pupils are equal, round, and reactive to light.  Visual fields are full to finger confrontation. Extraocular movements are intact.  The face is symmetric with normal sensation. The palate elevates in the midline. Hearing intact. Voice is normal. Shoulder shrug is normal. The tongue has normal motion without fasciculations.   Coordination:    Normal finger to nose  Gait:    Normal native gait  Motor Observation:   no involuntary movements noted. Tone:    Appears normal  Posture:    Posture is normal. normal erect    Strength:    Strength is anti-gravity and symmetric in the upper and lower limbs.      Sensation: intact to LT   DIAGNOSTIC DATA (LABS, IMAGING, TESTING) - I reviewed patient records, labs, notes, testing  and imaging myself where available.  Lab Results  Component Value Date   WBC 4.5 06/20/2021   HGB 15.4 06/20/2021   HCT 46.5 (H) 06/20/2021   MCV 94.7 06/20/2021   PLT 258 06/20/2021      Component Value Date/Time   NA 140 06/20/2021 0835   K 4.2 06/20/2021 0835   CL 105 06/20/2021 0835   CO2 29 06/20/2021 0835   GLUCOSE 86 06/20/2021 0835   BUN 9 06/20/2021 0835   CREATININE 0.85 06/20/2021 0835   CALCIUM 9.6 06/20/2021 0835   PROT 7.3 06/20/2021 0835   ALBUMIN 3.4 (L) 10/02/2020 1553   AST 20 06/20/2021 0835   ALT 25 06/20/2021 0835   ALKPHOS 113 10/02/2020 1553   BILITOT 0.5 06/20/2021 0835   GFRNONAA >60 10/02/2020 1553   GFRNONAA 103 07/29/2019 1652   GFRAA 119 07/29/2019 1652   Lab Results  Component Value Date   VITAMINB12 531 01/18/2019   Lab Results  Component Value Date   TSH 0.81 01/18/2019      ASSESSMENT AND PLAN 31 y.o. year old female  has a past medical history of Allergic rhinitis, Anxiety, Autoimmune disorder (HCC), COVID (04/05/2020), Frequent headaches, IBS (irritable bowel syndrome),  Migraine, and PCOS (polycystic ovarian syndrome). here with:  1.  Migraine headaches  - Started qulipta 12/19/2021: she has noticed side effects but she is feeling better, the past week she is getting results when she takes the [place] she gets relief and when she was on the monthly injectables even the [name] was not working. It is giving her GI side effects, nausea, constipation on the qulipta. It's manageable. She feels like she lost hair with the qulipta. She is not seeing an improvement in the amount of days but not as severe the migraine severity is significantly improved and it has been a month, will continue 3 months.   - Emgality did not work and wore off, Ajovy stopped working, Aimovig contraindicated due to constipation.   - The zavzpret did not help at all. Only one severe migraine since starting the qulipta.She is getting results acutely with ubrelvy and  also the Elyxyb has helped as well.   - give qulipta 3 months. If ineffective think about vyepti.   - If the qulipta is really helping will try for 3 months, We could also decrease dose of qulipta and see if that helps with the side effects in 3 months  - continue magnesium twice a day and also try stool softener and increase fluids  -Continue Ubrelvy  -Continue Elyxyb for abortive therapy  - they may not cover qulipta and botox, if continues to get better would move the botox out a month and see if still feeling better and if doing well stay on qulipta.  if she feels like needs both we may have to find samples and write a letter to insurance to try to get qulipta and botox approved or try Vyepti.   Naomie Dean, MD [facility] [address] [place], [place] [address] [phone]

## 2022-01-30 ENCOUNTER — Encounter: Payer: Self-pay | Admitting: Physician Assistant

## 2022-01-30 ENCOUNTER — Ambulatory Visit: Payer: BC Managed Care – PPO | Attending: Rheumatology | Admitting: Physician Assistant

## 2022-01-30 VITALS — BP 128/88 | HR 91 | Resp 14 | Ht 68.0 in | Wt 205.0 lb

## 2022-01-30 DIAGNOSIS — M79642 Pain in left hand: Secondary | ICD-10-CM

## 2022-01-30 DIAGNOSIS — Z79899 Other long term (current) drug therapy: Secondary | ICD-10-CM | POA: Diagnosis not present

## 2022-01-30 DIAGNOSIS — F410 Panic disorder [episodic paroxysmal anxiety] without agoraphobia: Secondary | ICD-10-CM

## 2022-01-30 DIAGNOSIS — M2242 Chondromalacia patellae, left knee: Secondary | ICD-10-CM

## 2022-01-30 DIAGNOSIS — F411 Generalized anxiety disorder: Secondary | ICD-10-CM

## 2022-01-30 DIAGNOSIS — R760 Raised antibody titer: Secondary | ICD-10-CM

## 2022-01-30 DIAGNOSIS — E559 Vitamin D deficiency, unspecified: Secondary | ICD-10-CM

## 2022-01-30 DIAGNOSIS — Z8719 Personal history of other diseases of the digestive system: Secondary | ICD-10-CM

## 2022-01-30 DIAGNOSIS — Z8379 Family history of other diseases of the digestive system: Secondary | ICD-10-CM

## 2022-01-30 DIAGNOSIS — M359 Systemic involvement of connective tissue, unspecified: Secondary | ICD-10-CM | POA: Diagnosis not present

## 2022-01-30 DIAGNOSIS — M25522 Pain in left elbow: Secondary | ICD-10-CM

## 2022-01-30 DIAGNOSIS — Z8619 Personal history of other infectious and parasitic diseases: Secondary | ICD-10-CM

## 2022-01-30 DIAGNOSIS — M79641 Pain in right hand: Secondary | ICD-10-CM | POA: Diagnosis not present

## 2022-01-30 DIAGNOSIS — M25521 Pain in right elbow: Secondary | ICD-10-CM

## 2022-01-30 DIAGNOSIS — Z8669 Personal history of other diseases of the nervous system and sense organs: Secondary | ICD-10-CM

## 2022-01-30 DIAGNOSIS — M2241 Chondromalacia patellae, right knee: Secondary | ICD-10-CM

## 2022-01-30 DIAGNOSIS — Z8616 Personal history of COVID-19: Secondary | ICD-10-CM

## 2022-01-30 MED ORDER — HYDROXYCHLOROQUINE SULFATE 200 MG PO TABS: 200.0000 mg | ORAL_TABLET | Freq: Two times a day (BID) | ORAL | 0 refills | Status: DC

## 2022-01-30 NOTE — Progress Notes (Signed)
Hgb and hct are borderline elevated-stable. Rest of CBC WNL.

## 2022-01-31 DIAGNOSIS — F4322 Adjustment disorder with anxiety: Secondary | ICD-10-CM | POA: Diagnosis not present

## 2022-02-01 LAB — COMPLETE METABOLIC PANEL WITH GFR
AG Ratio: 1.8 (calc) (ref 1.0–2.5)
ALT: 19 U/L (ref 6–29)
AST: 18 U/L (ref 10–30)
Albumin: 4.9 g/dL (ref 3.6–5.1)
Alkaline phosphatase (APISO): 30 U/L — ABNORMAL LOW (ref 31–125)
BUN: 9 mg/dL (ref 7–25)
CO2: 28 mmol/L (ref 20–32)
Calcium: 10.1 mg/dL (ref 8.6–10.2)
Chloride: 103 mmol/L (ref 98–110)
Creat: 0.86 mg/dL (ref 0.50–0.97)
Globulin: 2.7 g/dL (calc) (ref 1.9–3.7)
Glucose, Bld: 88 mg/dL (ref 65–99)
Potassium: 4 mmol/L (ref 3.5–5.3)
Sodium: 138 mmol/L (ref 135–146)
Total Bilirubin: 0.6 mg/dL (ref 0.2–1.2)
Total Protein: 7.6 g/dL (ref 6.1–8.1)
eGFR: 92 mL/min/{1.73_m2} (ref >=60)

## 2022-02-01 LAB — URINALYSIS, ROUTINE W REFLEX MICROSCOPIC
Bilirubin Urine: NEGATIVE
Glucose, UA: NEGATIVE
Hgb urine dipstick: NEGATIVE
Ketones, ur: NEGATIVE
Leukocytes,Ua: NEGATIVE
Nitrite: NEGATIVE
Protein, ur: NEGATIVE
Specific Gravity, Urine: 1.01 (ref 1.001–1.035)
pH: 6.5 (ref 5.0–8.0)

## 2022-02-01 LAB — CARDIOLIPIN ANTIBODIES, IGG, IGM, IGA
Anticardiolipin IgA: <2 APL-U/mL (ref <20.0)
Anticardiolipin IgG: <2 GPL-U/mL (ref <20.0)
Anticardiolipin IgM: <2 MPL-U/mL (ref <20.0)

## 2022-02-01 LAB — CBC WITH DIFFERENTIAL/PLATELET
Absolute Monocytes: 240 {cells}/uL (ref 200–950)
Basophils Absolute: 39 {cells}/uL (ref 0–200)
Basophils Relative: 0.8 %
Eosinophils Absolute: 49 {cells}/uL (ref 15–500)
Eosinophils Relative: 1 %
HCT: 45.9 % — ABNORMAL HIGH (ref 35.0–45.0)
Hemoglobin: 15.7 g/dL — ABNORMAL HIGH (ref 11.7–15.5)
Lymphs Abs: 2087 {cells}/uL (ref 850–3900)
MCH: 31.7 pg (ref 27.0–33.0)
MCHC: 34.2 g/dL (ref 32.0–36.0)
MCV: 92.5 fL (ref 80.0–100.0)
MPV: 12.8 fL — ABNORMAL HIGH (ref 7.5–12.5)
Monocytes Relative: 4.9 %
Neutro Abs: 2484 {cells}/uL (ref 1500–7800)
Neutrophils Relative %: 50.7 %
Platelets: 247 Thousand/uL (ref 140–400)
RBC: 4.96 Million/uL (ref 3.80–5.10)
RDW: 11.9 % (ref 11.0–15.0)
Total Lymphocyte: 42.6 %
WBC: 4.9 Thousand/uL (ref 3.8–10.8)

## 2022-02-01 LAB — ANTI-DNA ANTIBODY, DOUBLE-STRANDED: ds DNA Ab: 1 [IU]/mL

## 2022-02-01 LAB — VITAMIN D 25 HYDROXY (VIT D DEFICIENCY, FRACTURES): Vit D, 25-Hydroxy: 32 ng/mL (ref 30–100)

## 2022-02-01 LAB — C3 AND C4
C3 Complement: 108 mg/dL (ref 83–193)
C4 Complement: 22 mg/dL (ref 15–57)

## 2022-02-01 LAB — SEDIMENTATION RATE: Sed Rate: 2 mm/h (ref 0–20)

## 2022-02-02 NOTE — Progress Notes (Signed)
CMP normal, UA normal, double-stranded DNA negative, complements normal, sed rate normal, vitamin D normal, anticardiolipin negative.  Labs do not indicate active autoimmune disease.

## 2022-02-06 ENCOUNTER — Other Ambulatory Visit: Payer: Self-pay | Admitting: Physician Assistant

## 2022-02-08 DIAGNOSIS — N83291 Other ovarian cyst, right side: Secondary | ICD-10-CM | POA: Diagnosis not present

## 2022-02-08 DIAGNOSIS — N200 Calculus of kidney: Secondary | ICD-10-CM | POA: Diagnosis not present

## 2022-02-08 DIAGNOSIS — F1090 Alcohol use, unspecified, uncomplicated: Secondary | ICD-10-CM | POA: Diagnosis not present

## 2022-02-08 DIAGNOSIS — R161 Splenomegaly, not elsewhere classified: Secondary | ICD-10-CM | POA: Diagnosis not present

## 2022-02-08 DIAGNOSIS — R102 Pelvic and perineal pain: Secondary | ICD-10-CM | POA: Diagnosis not present

## 2022-02-08 DIAGNOSIS — Z79899 Other long term (current) drug therapy: Secondary | ICD-10-CM | POA: Diagnosis not present

## 2022-02-08 DIAGNOSIS — N83292 Other ovarian cyst, left side: Secondary | ICD-10-CM | POA: Diagnosis not present

## 2022-02-08 DIAGNOSIS — E282 Polycystic ovarian syndrome: Secondary | ICD-10-CM | POA: Diagnosis not present

## 2022-02-08 DIAGNOSIS — R350 Frequency of micturition: Secondary | ICD-10-CM | POA: Diagnosis not present

## 2022-02-08 DIAGNOSIS — M48061 Spinal stenosis, lumbar region without neurogenic claudication: Secondary | ICD-10-CM | POA: Diagnosis not present

## 2022-02-08 DIAGNOSIS — K760 Fatty (change of) liver, not elsewhere classified: Secondary | ICD-10-CM | POA: Diagnosis not present

## 2022-02-10 ENCOUNTER — Other Ambulatory Visit: Payer: Self-pay

## 2022-02-10 ENCOUNTER — Encounter (HOSPITAL_COMMUNITY): Payer: Self-pay

## 2022-02-10 ENCOUNTER — Emergency Department (HOSPITAL_COMMUNITY): Payer: BC Managed Care – PPO

## 2022-02-10 ENCOUNTER — Emergency Department (HOSPITAL_COMMUNITY)
Admission: EM | Admit: 2022-02-10 | Discharge: 2022-02-10 | Disposition: A | Discharge status: Home / Self Care | Payer: BC Managed Care – PPO | Attending: Emergency Medicine | Admitting: Emergency Medicine

## 2022-02-10 ENCOUNTER — Encounter: Payer: Self-pay | Admitting: Physician Assistant

## 2022-02-10 DIAGNOSIS — R1030 Lower abdominal pain, unspecified: Secondary | ICD-10-CM | POA: Insufficient documentation

## 2022-02-10 DIAGNOSIS — R1031 Right lower quadrant pain: Secondary | ICD-10-CM | POA: Diagnosis not present

## 2022-02-10 DIAGNOSIS — N200 Calculus of kidney: Secondary | ICD-10-CM | POA: Diagnosis not present

## 2022-02-10 DIAGNOSIS — Z0389 Encounter for observation for other suspected diseases and conditions ruled out: Secondary | ICD-10-CM | POA: Diagnosis not present

## 2022-02-10 LAB — CBC WITH DIFFERENTIAL/PLATELET
Abs Immature Granulocytes: 0.02 10*3/uL (ref 0.00–0.07)
Basophils Absolute: 0.1 10*3/uL (ref 0.0–0.1)
Basophils Relative: 1 %
Eosinophils Absolute: 0 10*3/uL (ref 0.0–0.5)
Eosinophils Relative: 1 %
HCT: 47.4 % — ABNORMAL HIGH (ref 36.0–46.0)
Hemoglobin: 16.3 g/dL — ABNORMAL HIGH (ref 12.0–15.0)
Immature Granulocytes: 0 %
Lymphocytes Relative: 36 %
Lymphs Abs: 2.2 10*3/uL (ref 0.7–4.0)
MCH: 31.8 pg (ref 26.0–34.0)
MCHC: 34.4 g/dL (ref 30.0–36.0)
MCV: 92.4 fL (ref 80.0–100.0)
Monocytes Absolute: 0.3 10*3/uL (ref 0.1–1.0)
Monocytes Relative: 5 %
Neutro Abs: 3.5 10*3/uL (ref 1.7–7.7)
Neutrophils Relative %: 57 %
Platelets: 267 10*3/uL (ref 150–400)
RBC: 5.13 MIL/uL — ABNORMAL HIGH (ref 3.87–5.11)
RDW: 11.9 % (ref 11.5–15.5)
WBC: 6 10*3/uL (ref 4.0–10.5)
nRBC: 0 % (ref 0.0–0.2)

## 2022-02-10 LAB — URINALYSIS, ROUTINE W REFLEX MICROSCOPIC
Bilirubin Urine: NEGATIVE
Glucose, UA: NEGATIVE mg/dL
Hgb urine dipstick: NEGATIVE
Ketones, ur: NEGATIVE mg/dL
Leukocytes,Ua: NEGATIVE
Nitrite: NEGATIVE
Protein, ur: NEGATIVE mg/dL
Specific Gravity, Urine: 1.001 — ABNORMAL LOW (ref 1.005–1.030)
pH: 7 (ref 5.0–8.0)

## 2022-02-10 LAB — BASIC METABOLIC PANEL
Anion gap: 14 (ref 5–15)
BUN: 5 mg/dL — ABNORMAL LOW (ref 6–20)
CO2: 24 mmol/L (ref 22–32)
Calcium: 9.7 mg/dL (ref 8.9–10.3)
Chloride: 102 mmol/L (ref 98–111)
Creatinine, Ser: 1 mg/dL (ref 0.44–1.00)
GFR, Estimated: >60 mL/min (ref >60)
Glucose, Bld: 106 mg/dL — ABNORMAL HIGH (ref 70–99)
Potassium: 4 mmol/L (ref 3.5–5.1)
Sodium: 140 mmol/L (ref 135–145)

## 2022-02-10 LAB — I-STAT BETA HCG BLOOD, ED (MC, WL, AP ONLY): I-stat hCG, quantitative: <5 m[IU]/mL (ref <5)

## 2022-02-10 MED ORDER — KETOROLAC TROMETHAMINE 15 MG/ML IJ SOLN
15.0000 mg | Freq: Once | INTRAMUSCULAR | Status: AC
  Administered 2022-02-10: 15 mg via INTRAVENOUS
  Filled 2022-02-10: qty 1

## 2022-02-10 MED ORDER — MORPHINE SULFATE (PF) 4 MG/ML IV SOLN
4.0000 mg | Freq: Once | INTRAVENOUS | Status: AC
  Administered 2022-02-10: 4 mg via INTRAVENOUS
  Filled 2022-02-10: qty 1

## 2022-02-10 MED ORDER — IOHEXOL 350 MG/ML SOLN
75.0000 mL | Freq: Once | INTRAVENOUS | Status: AC | PRN
  Administered 2022-02-10: 75 mL via INTRAVENOUS

## 2022-02-10 MED ORDER — SODIUM CHLORIDE 0.9 % IV BOLUS
1000.0000 mL | Freq: Once | INTRAVENOUS | Status: AC
  Administered 2022-02-10: 1000 mL via INTRAVENOUS

## 2022-02-10 MED ORDER — HYDROMORPHONE HCL 1 MG/ML IJ SOLN
1.0000 mg | Freq: Once | INTRAMUSCULAR | Status: DC
  Filled 2022-02-10: qty 1

## 2022-02-10 MED ORDER — ONDANSETRON HCL 4 MG/2ML IJ SOLN
4.0000 mg | Freq: Once | INTRAMUSCULAR | Status: AC
  Administered 2022-02-10: 4 mg via INTRAVENOUS
  Filled 2022-02-10: qty 2

## 2022-02-10 NOTE — ED Notes (Signed)
Patient transported to CT 

## 2022-02-10 NOTE — Discharge Instructions (Signed)
Take 8 scoops of miralax in 32oz of whatever you would like to drink.(Gatorade comes in this size) You can also use a fleets enema which you can buy over the counter at the pharmacy.  Return for worsening abdominal pain, vomiting or fever. ? ?

## 2022-02-10 NOTE — ED Provider Notes (Signed)
[facility] EMERGENCY DEPARTMENT Provider Note   CSN: [id number] Arrival date & time: 02/10/22  8756     History  No chief complaint on file.   Jennifer Vincent is a 31 y.o. female.  31 yo F with a cc of lower abdominal pain.  Going on for about 72 hours or so.  Thought was UTI and started on antibiotics.  Went to OSH, found to have likely hemorrhagic free fluid in the pelvis from ruptured ovarian cyst.  Sent home on pain meds.  Since then pain has increased.  Diffuse lower, constant, radiates to the back.  Feels like prior childbirth but not coming and going.  Normal discharge, no vaginal bleeding, no urinary symptoms.  No nausea but hasn't felt like eating.  Constipated since last ED visit.    Denies loss of bowel or bladder, denies loss of perirectal sensation, no numbness or weakness to the legs. No trauma.         Home Medications Prior to Admission medications   Medication Sig Start Date End Date Taking? Authorizing Provider  ALPRAZolam (XANAX) 0.25 MG tablet TAKE 1 TABLET(0.25 MG) BY MOUTH TWICE DAILY AS NEEDED FOR ANXIETY 01/16/22   Allwardt, Alyssa M, PA-C  Atogepant (QULIPTA) 60 MG TABS Take 60 mg by mouth daily. 12/12/21   Butch Penny, NP  busPIRone (BUSPAR) 15 MG tablet TAKE 2 TABLETS(30 MG) BY MOUTH TWICE DAILY 12/26/21   Allwardt, Crist Infante, PA-C  Celecoxib (ELYXYB) 120 MG/4.8ML SOLN Take 120 mg by mouth daily as needed. One dose maximum daily. 08/27/21   Anson Fret, MD  hydroxychloroquine (PLAQUENIL) 200 MG tablet Take 1 tablet (200 mg total) by mouth 2 (two) times daily. 01/30/22   Gearldine Bienenstock, PA-C  JUNEL FE 1/20 1-20 MG-MCG tablet Take 1 tablet by mouth daily. 05/29/21   [provider]  ondansetron (ZOFRAN) 4 MG tablet Take 1 tablet (4 mg total) by mouth every 8 (eight) hours as needed for nausea or vomiting. Patient not taking: Reported on 01/30/2022 10/10/21   Allwardt, Crist Infante, PA-C  Prenatal Vit-Fe Fumarate-FA (PRENATAL  MULTIVITAMIN) TABS tablet Take 1 tablet by mouth daily at 12 noon. Patient not taking: Reported on 01/30/2022    [provider]  sertraline (ZOLOFT) 100 MG tablet Take 1 tablet (100 mg total) by mouth daily. 09/19/21   Allwardt, Crist Infante, PA-C  sertraline (ZOLOFT) 25 MG tablet Take 1 tablet (25 mg total) by mouth daily. Take with the 100 mg dose to total 125 mg daily. Patient not taking: Reported on 01/30/2022 01/10/22   Allwardt, Alyssa M, PA-C  Ubrogepant (UBRELVY) 100 MG TABS Take 1 tablet onset migraine, may take another 2 hours later if needed. 10/15/21   Anson Fret, MD      Allergies    Depakote [divalproex sodium] and Lactose intolerance (gi)    Review of Systems   Review of Systems  Physical Exam Updated Vital Signs BP 115/83   Pulse 73   Temp 98.8 F (37.1 C)   Resp 16   SpO2 100%  Physical Exam Vitals and nursing note reviewed.  Constitutional:      General: She is not in acute distress.    Appearance: She is well-developed. She is not diaphoretic.  HENT:     Head: Normocephalic and atraumatic.  Eyes:     Pupils: Pupils are equal, round, and reactive to light.  Cardiovascular:     Rate and Rhythm: Normal rate and regular rhythm.  Heart sounds: No murmur heard.    No friction rub. No gallop.  Pulmonary:     Effort: Pulmonary effort is normal.     Breath sounds: No wheezing or rales.  Abdominal:     General: There is no distension.     Palpations: Abdomen is soft.     Tenderness: There is no abdominal tenderness.     Comments: Diffuse pain, worst to lower aspect, mild low back pain on palpation as well.   Musculoskeletal:        General: No tenderness.     Cervical back: Normal range of motion and neck supple.  Skin:    General: Skin is warm and dry.  Neurological:     Mental Status: She is alert and oriented to person, place, and time.  Psychiatric:        Behavior: Behavior normal.     ED Results / Procedures / Treatments   Labs (all  labs ordered are listed, but only abnormal results are displayed) Labs Reviewed  BASIC METABOLIC PANEL - Abnormal; Notable for the following components:      Result Value   Glucose, Bld 106 (*)    BUN 5 (*)    All other components within normal limits  CBC WITH DIFFERENTIAL/PLATELET - Abnormal; Notable for the following components:   RBC 5.13 (*)    Hemoglobin 16.3 (*)    HCT 47.4 (*)    All other components within normal limits  URINALYSIS, ROUTINE W REFLEX MICROSCOPIC - Abnormal; Notable for the following components:   Color, Urine STRAW (*)    Specific Gravity, Urine 1.001 (*)    All other components within normal limits  I-STAT BETA HCG BLOOD, ED ([facility], [facility], [facility] ONLY)    EKG None  Radiology CT ABDOMEN PELVIS W CONTRAST  Result Date: 02/10/2022 CLINICAL DATA:  Severe lower pelvic pain with radiation to the back. EXAM: CT ABDOMEN AND PELVIS WITH CONTRAST TECHNIQUE: Multidetector CT imaging of the abdomen and pelvis was performed using the standard protocol following bolus administration of intravenous contrast. RADIATION DOSE REDUCTION: This exam was performed according to the departmental dose-optimization program which includes automated exposure control, adjustment of the mA and/or kV according to patient size and/or use of iterative reconstruction technique. CONTRAST:  2mL OMNIPAQUE IOHEXOL 350 MG/ML SOLN COMPARISON:  Pelvic ultrasound 02/10/2022. FINDINGS: Lower chest: Unremarkable. Hepatobiliary: No suspicious focal abnormality within the liver parenchyma. There is no evidence for gallstones, gallbladder wall thickening, or pericholecystic fluid. No intrahepatic or extrahepatic biliary dilation. Pancreas: No focal mass lesion. No dilatation of the main duct. No intraparenchymal cyst. No peripancreatic edema. Spleen: No splenomegaly. No focal mass lesion. Adrenals/Urinary Tract: No adrenal nodule or mass. 2 mm nonobstructing stone identified interpolar right kidney. Left kidney  unremarkable. No evidence for hydroureter. The urinary bladder appears normal for the degree of distention. Stomach/Bowel: Stomach is unremarkable. No gastric wall thickening. No evidence of outlet obstruction. Duodenum is normally positioned as is the ligament of Treitz. No small bowel wall thickening. No small bowel dilatation. The terminal ileum is normal. The appendix is normal. No gross colonic mass. No colonic wall thickening. Vascular/Lymphatic: No abdominal aortic aneurysm. No abdominal aortic atherosclerotic calcification. There is no gastrohepatic or hepatoduodenal ligament lymphadenopathy. No retroperitoneal or mesenteric lymphadenopathy. No pelvic sidewall lymphadenopathy. Reproductive: The uterus is unremarkable.  There is no adnexal mass. Other: No intraperitoneal free fluid. Musculoskeletal: No worrisome lytic or sclerotic osseous abnormality. IMPRESSION: 1. No acute findings in the abdomen or pelvis. Specifically,  no findings to explain the patient's history of pelvic pain. 2. 2 mm nonobstructing stone interpolar right kidney. Electronically Signed   By: Kennith CenterEric  Mansell M.D.   On: 02/10/2022 12:18   US Pelvis Complete  Result Date: 02/10/2022 CLINICAL DATA:  Possible ovarian torsion. EXAM: TRANSABDOMINAL AND TRANSVAGINAL ULTRASOUND OF PELVIS DOPPLER ULTRASOUND OF OVARIES TECHNIQUE: Both transabdominal and transvaginal ultrasound examinations of the pelvis were performed. Transabdominal technique was performed for global imaging of the pelvis including uterus, ovaries, adnexal regions, and pelvic cul-de-sac. It was necessary to proceed with endovaginal exam following the transabdominal exam to visualize the endometrium and ovaries. Color and duplex Doppler ultrasound was utilized to evaluate blood flow to the ovaries. COMPARISON:  None Available. FINDINGS: Uterus Measurements: 6.6 x 6.3 x 4.0 cm = volume: 71 mL. No fibroids or other mass visualized. Endometrium Thickness: 5 mm which is within  normal limits. No focal abnormality visualized. Right ovary Measurements: 3.6 x 2.6 x 2.2 cm = volume: 11 mL. Normal appearance/no adnexal mass. Left ovary Measurements: 3.5 x 2.7 x 1.7 cm = volume: 9 mL. Normal appearance/no adnexal mass. Pulsed Doppler evaluation of both ovaries demonstrates normal low-resistance arterial and venous waveforms. Other findings No abnormal free fluid. IMPRESSION: No evidence of ovarian mass or torsion. No definite abnormality seen in the pelvis. Electronically Signed   By: Lupita RaiderJames  Green Jr M.D.   On: 02/10/2022 10:38   US Transvaginal Non-OB  Result Date: 02/10/2022 CLINICAL DATA:  Possible ovarian torsion. EXAM: TRANSABDOMINAL AND TRANSVAGINAL ULTRASOUND OF PELVIS DOPPLER ULTRASOUND OF OVARIES TECHNIQUE: Both transabdominal and transvaginal ultrasound examinations of the pelvis were performed. Transabdominal technique was performed for global imaging of the pelvis including uterus, ovaries, adnexal regions, and pelvic cul-de-sac. It was necessary to proceed with endovaginal exam following the transabdominal exam to visualize the endometrium and ovaries. Color and duplex Doppler ultrasound was utilized to evaluate blood flow to the ovaries. COMPARISON:  None Available. FINDINGS: Uterus Measurements: 6.6 x 6.3 x 4.0 cm = volume: 71 mL. No fibroids or other mass visualized. Endometrium Thickness: 5 mm which is within normal limits. No focal abnormality visualized. Right ovary Measurements: 3.6 x 2.6 x 2.2 cm = volume: 11 mL. Normal appearance/no adnexal mass. Left ovary Measurements: 3.5 x 2.7 x 1.7 cm = volume: 9 mL. Normal appearance/no adnexal mass. Pulsed Doppler evaluation of both ovaries demonstrates normal low-resistance arterial and venous waveforms. Other findings No abnormal free fluid. IMPRESSION: No evidence of ovarian mass or torsion. No definite abnormality seen in the pelvis. Electronically Signed   By: Lupita RaiderJames  Green Jr M.D.   On: 02/10/2022 10:38   US Art/Ven Flow  Abd Pelv Doppler  Result Date: 02/10/2022 CLINICAL DATA:  Possible ovarian torsion. EXAM: TRANSABDOMINAL AND TRANSVAGINAL ULTRASOUND OF PELVIS DOPPLER ULTRASOUND OF OVARIES TECHNIQUE: Both transabdominal and transvaginal ultrasound examinations of the pelvis were performed. Transabdominal technique was performed for global imaging of the pelvis including uterus, ovaries, adnexal regions, and pelvic cul-de-sac. It was necessary to proceed with endovaginal exam following the transabdominal exam to visualize the endometrium and ovaries. Color and duplex Doppler ultrasound was utilized to evaluate blood flow to the ovaries. COMPARISON:  None Available. FINDINGS: Uterus Measurements: 6.6 x 6.3 x 4.0 cm = volume: 71 mL. No fibroids or other mass visualized. Endometrium Thickness: 5 mm which is within normal limits. No focal abnormality visualized. Right ovary Measurements: 3.6 x 2.6 x 2.2 cm = volume: 11 mL. Normal appearance/no adnexal mass. Left ovary Measurements: 3.5 x 2.7 x 1.7  cm = volume: 9 mL. Normal appearance/no adnexal mass. Pulsed Doppler evaluation of both ovaries demonstrates normal low-resistance arterial and venous waveforms. Other findings No abnormal free fluid. IMPRESSION: No evidence of ovarian mass or torsion. No definite abnormality seen in the pelvis. Electronically Signed   By: Lupita Raider M.D.   On: 02/10/2022 10:38    Procedures Procedures    Medications Ordered in ED Medications  ketorolac (TORADOL) 15 MG/ML injection 15 mg (has no administration in time range)  ondansetron (ZOFRAN) injection 4 mg (4 mg Intravenous Given 02/10/22 0936)  sodium chloride 0.9 % bolus 1,000 mL (0 mLs Intravenous Stopped 02/10/22 1057)  morphine (PF) 4 MG/ML injection 4 mg (4 mg Intravenous Given 02/10/22 0943)  iohexol (OMNIPAQUE) 350 MG/ML injection 75 mL (75 mLs Intravenous Contrast Given 02/10/22 1149)    ED Course/ Medical Decision Making/ A&P                           Medical Decision  Making Amount and/or Complexity of Data Reviewed Labs: ordered. Radiology: ordered.  Risk Prescription drug management.   31 yo F with a cc of lower abdominal pain.  Going on for about 72 hours or so.  Initially thought to be UTI started on abx.  Patient with ED visit to OSH 48 hours ago.  On my record review had CT and US imaging.  Thought to be ruptured cyst.   Will repeat US here, treat pain, lab work, reassess.   Ultrasound today for obvious acute pathology, negative for torsion.  No leukocytosis, mild elevation of hemoglobin.  No significant electrolyte abnormality.  Pregnancy test is negative.  With ongoing discomfort we will obtain a CT scan.  The patient's family is here, her father is an [employer] boy.  He shared with me the CT images from about 48 hours ago.  Very mild amounts of free fluid in the pelvis on my independent interpretation.  CT scan done here without any obvious fluid.  I discussed the results with the patient and family.  Discussed case with the OB/GYN on-call for her practice Dr. Imogene Burn.  Recommended calling the office first thing in the morning on Monday to try and schedule an urgent appointment.  12:46 PM:  I have discussed the diagnosis/risks/treatment options with the patient and family.  Evaluation and diagnostic testing in the emergency department does not suggest an emergent condition requiring admission or immediate intervention beyond what has been performed at this time.  They will follow up with GYN. We also discussed returning to the ED immediately if new or worsening sx occur. We discussed the sx which are most concerning (e.g., sudden worsening pain, fever, inability to tolerate by mouth) that necessitate immediate return. Medications administered to the patient during their visit and any new prescriptions provided to the patient are listed below.  Medications given during this visit Medications  ketorolac (TORADOL) 15 MG/ML injection 15 mg (has no  administration in time range)  ondansetron (ZOFRAN) injection 4 mg (4 mg Intravenous Given 02/10/22 0936)  sodium chloride 0.9 % bolus 1,000 mL (0 mLs Intravenous Stopped 02/10/22 1057)  morphine (PF) 4 MG/ML injection 4 mg (4 mg Intravenous Given 02/10/22 0943)  iohexol (OMNIPAQUE) 350 MG/ML injection 75 mL (75 mLs Intravenous Contrast Given 02/10/22 1149)     The patient appears reasonably screen and/or stabilized for discharge and I doubt any other medical condition or other [facility] requiring further screening, evaluation, or treatment in the  ED at this time prior to discharge.          Final Clinical Impression(s) / ED Diagnoses Final diagnoses:  Lower abdominal pain    Rx / DC Orders ED Discharge Orders     None         Melene Plan, DO 02/10/22 1246

## 2022-02-10 NOTE — ED Provider Triage Note (Signed)
Emergency Medicine Provider Triage Evaluation Note  Jennifer Vincent , a 31 y.o. female  was evaluated in triage.  Pt complains of pelvic pain.  Pain is located left lower quadrant and right lower quadrant of her abdomen.  It radiates to her back.  Pain started 4 days ago.  She was evaluated at [facility] for this complaint.  Was found to have multiple ovarian cysts on both of her ovaries.  CT scan also revealed small renal stones.  She was sent home with pain medication and advised to follow-up with her OB.  Last night pain is much worse.  She stated that she was advised to come back to the ED as she is at risk for ovarian torsion.  Denies abnormal vaginal discharge.  Denies painful urination and hematuria.  Review of Systems  Positive: See above Negative: See above  Physical Exam  BP (!) 141/98 (BP Location: Right Arm)   Pulse (!) 112   Temp 98.8 F (37.1 C)   Resp 18   SpO2 99%  Gen:   Awake, no distress   Resp:  Normal effort  MSK:   Moves extremities without difficulty  Other:    Medical Decision Making  Medically screening exam initiated at 9:11 AM.  Appropriate orders placed.  Jennifer Vincent was informed that the remainder of the evaluation will be completed by another provider, this initial triage assessment does not replace that evaluation, and the importance of remaining in the ED until their evaluation is complete.  Work up intitated   Gareth Eagle, PA-C 02/10/22 [phone]

## 2022-02-10 NOTE — ED Notes (Signed)
Patient transported to US 

## 2022-02-10 NOTE — ED Triage Notes (Signed)
Patient complains of severe lower pelvic pain with radiation to back. Seen at [facility] ED on Thursday and had CT and vaginal U/S. They saw fluid and multiple cysts and gave pain meds. Painful to sit, denies dysuria

## 2022-02-11 DIAGNOSIS — R102 Pelvic and perineal pain: Secondary | ICD-10-CM | POA: Diagnosis not present

## 2022-02-11 DIAGNOSIS — Z309 Encounter for contraceptive management, unspecified: Secondary | ICD-10-CM | POA: Diagnosis not present

## 2022-02-11 NOTE — Telephone Encounter (Signed)
Please see patient message and advise if you would like to see her sooner than 02/19/22.

## 2022-02-11 NOTE — Telephone Encounter (Signed)
Last OV: 01/10/22 VV  Next OV: 02/19/22  Last filled: 01/16/22  Quantity: 6

## 2022-02-12 ENCOUNTER — Encounter: Payer: Self-pay | Admitting: Neurology

## 2022-02-14 DIAGNOSIS — F4322 Adjustment disorder with anxiety: Secondary | ICD-10-CM | POA: Diagnosis not present

## 2022-02-18 ENCOUNTER — Other Ambulatory Visit (HOSPITAL_COMMUNITY): Payer: Self-pay | Admitting: Obstetrics and Gynecology

## 2022-02-18 DIAGNOSIS — R102 Pelvic and perineal pain: Secondary | ICD-10-CM | POA: Diagnosis not present

## 2022-02-18 DIAGNOSIS — R1032 Left lower quadrant pain: Secondary | ICD-10-CM | POA: Diagnosis not present

## 2022-02-18 DIAGNOSIS — M79602 Pain in left arm: Secondary | ICD-10-CM

## 2022-02-19 ENCOUNTER — Other Ambulatory Visit (HOSPITAL_COMMUNITY): Payer: Self-pay | Admitting: Obstetrics and Gynecology

## 2022-02-19 ENCOUNTER — Ambulatory Visit (HOSPITAL_COMMUNITY)
Admission: RE | Admit: 2022-02-19 | Discharge: 2022-02-19 | Disposition: A | Discharge status: Home / Self Care | Payer: BC Managed Care – PPO | Source: Ambulatory Visit | Attending: Obstetrics and Gynecology | Admitting: Obstetrics and Gynecology

## 2022-02-19 ENCOUNTER — Ambulatory Visit (INDEPENDENT_AMBULATORY_CARE_PROVIDER_SITE_OTHER): Payer: BC Managed Care – PPO | Admitting: Physician Assistant

## 2022-02-19 ENCOUNTER — Encounter: Payer: Self-pay | Admitting: Physician Assistant

## 2022-02-19 ENCOUNTER — Other Ambulatory Visit (INDEPENDENT_AMBULATORY_CARE_PROVIDER_SITE_OTHER): Payer: BC Managed Care – PPO

## 2022-02-19 VITALS — BP 118/76 | HR 97 | Temp 97.5°F | Ht 68.0 in | Wt 200.4 lb

## 2022-02-19 DIAGNOSIS — M79602 Pain in left arm: Secondary | ICD-10-CM

## 2022-02-19 DIAGNOSIS — R102 Pelvic and perineal pain: Secondary | ICD-10-CM

## 2022-02-19 DIAGNOSIS — N200 Calculus of kidney: Secondary | ICD-10-CM

## 2022-02-19 DIAGNOSIS — D582 Other hemoglobinopathies: Secondary | ICD-10-CM

## 2022-02-19 LAB — CBC WITH DIFFERENTIAL/PLATELET
Basophils Absolute: 0.1 10*3/uL (ref 0.0–0.1)
Basophils Relative: 1.1 % (ref 0.0–3.0)
Eosinophils Absolute: 0 10*3/uL (ref 0.0–0.7)
Eosinophils Relative: 0.5 % (ref 0.0–5.0)
HCT: 41.8 % (ref 36.0–46.0)
Hemoglobin: 14.5 g/dL (ref 12.0–15.0)
Lymphocytes Relative: 34.6 % (ref 12.0–46.0)
Lymphs Abs: 2.1 10*3/uL (ref 0.7–4.0)
MCHC: 34.6 g/dL (ref 30.0–36.0)
MCV: 91.2 fl (ref 78.0–100.0)
Monocytes Absolute: 0.3 10*3/uL (ref 0.1–1.0)
Monocytes Relative: 4.1 % (ref 3.0–12.0)
Neutro Abs: 3.7 10*3/uL (ref 1.4–7.7)
Neutrophils Relative %: 59.7 % (ref 43.0–77.0)
Platelets: 235 10*3/uL (ref 150.0–400.0)
RBC: 4.58 Mil/uL (ref 3.87–5.11)
RDW: 12.7 % (ref 11.5–15.5)
WBC: 6.1 10*3/uL (ref 4.0–10.5)

## 2022-02-19 NOTE — Progress Notes (Signed)
Subjective:    Patient ID: Jennifer Vincent, female    DOB: 1990/11/22, 31 y.o.   MRN: [id number]  Chief Complaint  Patient presents with   Follow-up    Pt in office to recent ED follow up; didn't know she had kidney stones, thought it was cyst rupture, pt is now feeling ok just some pelvic and leg pain; also wants to discuss lab results; got vaginal suppository and was able to sleep last night.     HPI Patient is in today for ED f/up from 02/10/22.  Patient states that she was having severe pelvic pain and conclusion was that patient probably had an ovarian cyst rupture.  She states that her CT showed small kidney stones and she was wondering about this.  She also has concerns that her hemoglobin has been rising over the last few months.   Working with OB/GYN about possible ovarian cyst.  Messaged Dr. Jaynee Eagles about Dan Maker and possible cause of elevated hemoglobin, didn't think this was related.   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   No issues with clotting problems in herself or the family that she is aware of.    Past Medical History:  Diagnosis Date   Allergic rhinitis    Anxiety    Autoimmune disorder ([place])    nonspecified   COVID 04/05/2020   Frequent headaches    IBS (irritable bowel syndrome)    Migraine    PCOS (polycystic ovarian syndrome)     Past Surgical History:  Procedure Laterality Date   TONSILLECTOMY      Family History  Problem Relation Age of Onset   Depression Mother    Eating disorder Mother    Arthritis Father    Crohn's disease Father    Ulcerative colitis Father    Arthritis Brother    Alcohol abuse Brother    Mental illness Brother    Depression Brother    Anxiety disorder Brother    Crohn's disease Brother    Migraines Maternal Uncle    Breast cancer Maternal Grandmother    Mental illness Maternal Grandfather    Anxiety disorder Maternal Grandfather     OCD Maternal Grandfather    Heart disease Paternal Grandfather    Diabetes Paternal Grandfather    Heart attack Paternal Grandfather    Healthy Daughter    Healthy Son     Social History   Tobacco Use   Smoking status: Never    Passive exposure: Never   Smokeless tobacco: Never  Vaping Use   Vaping Use: Never used  Substance Use Topics   Alcohol use: Not Currently   Drug use: Never     Allergies  Allergen Reactions   Depakote [Divalproex Sodium]     Depression/suicidal thoughts   Lactose Intolerance (Gi)     GI upset    Review of Systems NEGATIVE UNLESS OTHERWISE INDICATED IN HPI      Objective:     BP 118/76 (BP Location: Left Arm)   Pulse 97   Temp (!) 97.5 F (36.4 C) (Temporal)   Ht 5\' 8"  (1.727 m)   Wt 200 lb 6.4 oz (90.9 kg)   SpO2 99%   BMI 30.47 kg/m   Wt Readings from Last 3 Encounters:  02/19/22 200 lb 6.4 oz (90.9 kg)  01/30/22 205 lb (93 kg)  12/12/21 212 lb 9.6 oz (96.4 kg)    BP Readings from Last 3  Encounters:  02/19/22 118/76  02/10/22 114/79  01/30/22 128/88     Physical Exam Vitals and nursing note reviewed.  Constitutional:      Appearance: Normal appearance. She is normal weight. She is not toxic-appearing.  HENT:     Head: Normocephalic and atraumatic.     Left Ear: Ear canal normal.  Eyes:     Extraocular Movements: Extraocular movements intact.     Conjunctiva/sclera: Conjunctivae normal.     Pupils: Pupils are equal, round, and reactive to light.  Cardiovascular:     Rate and Rhythm: Normal rate and regular rhythm.     Pulses: Normal pulses.     Heart sounds: Normal heart sounds.  Pulmonary:     Effort: Pulmonary effort is normal.     Breath sounds: Normal breath sounds.  Abdominal:     General: Abdomen is flat. Bowel sounds are normal.     Palpations: Abdomen is soft.  Musculoskeletal:        General: Normal range of motion.     Cervical back: Normal range of motion and neck supple.  Skin:    General: Skin is  warm and dry.  Neurological:     General: No focal deficit present.     Mental Status: She is alert and oriented to person, place, and time.  Psychiatric:        Mood and Affect: Mood normal.        Behavior: Behavior normal.        Thought Content: Thought content normal.        Judgment: Judgment normal.        Assessment & Plan:  Elevated hemoglobin (HCC) -     CBC with Differential/Platelet; Future -     Pathologist smear review; Future  Renal stone  Pelvic pain   I personally reviewed patient's emergency department report and her CT imaging and her labs.  Reassured patient that the right kidney stone is very small at 2 mm and not obstructing.  As long as no pain or symptoms, would recommend just continuing to push fluids and does not need to follow-up with urology at this time.  She is working with her gynecologist regarding her possible ovarian cyst and should be having an ultrasound.  Her pain is much better.  In regard to her hemoglobin, plan to repeat CBC and pathology smear review.  Pending abnormal results, she may need to consult with hematology.  Patient understanding and agreeable of plan.     Return if symptoms worsen or fail to improve.  This note was prepared with assistance of Conservation officer, historic buildings. Occasional wrong-word or sound-a-like substitutions may have occurred due to the inherent limitations of voice recognition software.  Time Spent: 39 minutes of total time was spent on the date of the encounter performing the following actions: chart review prior to seeing the patient, obtaining history, performing a medically necessary exam, counseling on the treatment plan, placing orders, and documenting in our EHR.       Vontrell Pullman M Temitayo Covalt, PA-C

## 2022-02-20 ENCOUNTER — Encounter: Payer: Self-pay | Admitting: Physician Assistant

## 2022-02-20 LAB — PATHOLOGIST SMEAR REVIEW

## 2022-02-20 NOTE — Telephone Encounter (Signed)
Please see pt message and advise 

## 2022-02-25 ENCOUNTER — Encounter: Payer: Self-pay | Admitting: Neurology

## 2022-02-25 ENCOUNTER — Ambulatory Visit (INDEPENDENT_AMBULATORY_CARE_PROVIDER_SITE_OTHER): Payer: BC Managed Care – PPO | Admitting: Neurology

## 2022-02-25 DIAGNOSIS — G43711 Chronic migraine without aura, intractable, with status migrainosus: Secondary | ICD-10-CM | POA: Diagnosis not present

## 2022-02-25 MED ORDER — ONABOTULINUMTOXINA 200 UNITS IJ SOLR: 155.0000 [IU] | Freq: Once | INTRAMUSCULAR | Status: DC

## 2022-02-25 NOTE — Progress Notes (Signed)
Botox- 200 units x 1 vial Lot: [id number] Expiration: 06/2024 NDC: 0023-3921-02  Bacteriostatic 0.9% Sodium Chloride- 4mL total Lot: [id number] Expiration: 11/17/2022 NDC: 0409-1966-02  Dx: G43.711 B/B  

## 2022-02-25 NOTE — Progress Notes (Signed)
Consent Form Botulism Toxin Injection For Chronic Migraine  I spent over 20 minutes of face-to-face and non-face-to-face time with patient on the  1. Chronic migraine without aura, with intractable migraine, so stated, with status migrainosus    diagnosis.  This included previsit chart review, lab review, study review, order entry, electronic health record documentation, patient education on the different diagnostic and therapeutic options, counseling and coordination of care, risks and benefits of management, compliance, or risk factor reduction and procedure.    02/25/2022: Doing well 5 migraine days a month and the [place] helps acutely. +a. Next would try Vyepti. May move botox out to every 10 weeks next time. SAMPLES TODAY bill by time  Reviewed orally with patient, additionally signature is on file:  Botulism toxin has been approved by the Federal drug administration for treatment of chronic migraine. Botulism toxin does not cure chronic migraine and it may not be effective in some patients.  The administration of botulism toxin is accomplished by injecting a small amount of toxin into the muscles of the neck and head. Dosage must be titrated for each individual. Any benefits resulting from botulism toxin tend to wear off after 3 months with a repeat injection required if benefit is to be maintained. Injections are usually done every 3-4 months with maximum effect peak achieved by about 2 or 3 weeks. Botulism toxin is expensive and you should be sure of what costs you will incur resulting from the injection.  The side effects of botulism toxin use for chronic migraine may include:   -Transient, and usually mild, facial weakness with facial injections  -Transient, and usually mild, head or neck weakness with head/neck injections  -Reduction or loss of forehead facial animation due to forehead muscle weakness  -Eyelid drooping  -Dry eye  -Pain at the site of injection  or bruising at the site of injection  -Double vision  -Potential unknown long term risks   Contraindications: You should not have Botox if you are pregnant, nursing, allergic to albumin, have an infection, skin condition, or muscle weakness at the site of the injection, or have myasthenia gravis, Lambert-Eaton syndrome, or ALS.  It is also possible that as with any injection, there may be an allergic reaction or no effect from the medication. Reduced effectiveness after repeated injections is sometimes seen and rarely infection at the injection site may occur. All care will be taken to prevent these side effects. If therapy is given over a long time, atrophy and wasting in the muscle injected may occur. Occasionally the patient's become refractory to treatment because they develop antibodies to the toxin. In this event, therapy needs to be modified.  I have read the above information and consent to the administration of botulism toxin.    BOTOX PROCEDURE NOTE FOR MIGRAINE HEADACHE  Contraindications and precautions discussed with patient(above). Aseptic procedure was observed and patient tolerated procedure. Procedure performed by Ihor Austin, NP-BC.   The condition has existed for more than 6 months, and pt does not have a diagnosis of ALS, Myasthenia Gravis or Lambert-Eaton Syndrome.  Risks and benefits of injections discussed and pt agrees to proceed with the procedure.  Written consent obtained  These injections are medically necessary. Pt  receives good benefits from these injections. These injections do not cause sedations or hallucinations which the oral therapies may cause.   Description of procedure:  The patient was placed in a sitting position. The standard protocol was used for Botox  as follows, with 5 units of Botox injected at each site:  -Procerus muscle, midline injection  -Corrugator muscle, bilateral injection  -Frontalis muscle, bilateral injection, with 2 sites  each side, medial injection was performed in the upper one third of the frontalis muscle, in the region vertical from the medial inferior edge of the superior orbital rim. The lateral injection was again in the upper one third of the forehead vertically above the lateral limbus of the cornea, 1.5 cm lateral to the medial injection site.  -Temporalis muscle injection, 4 sites, bilaterally. The first injection was 3 cm above the tragus of the ear, second injection site was 1.5 cm to 3 cm up from the first injection site in line with the tragus of the ear. The third injection site was 1.5-3 cm forward between the first 2 injection sites. The fourth injection site was 1.5 cm posterior to the second injection site. 5th site laterally in the temporalis  muscleat the level of the outer canthus.  -Occipitalis muscle injection, 3 sites, bilaterally. The first injection was done one half way between the occipital protuberance and the tip of the mastoid process behind the ear. The second injection site was done lateral and superior to the first, 1 fingerbreadth from the first injection. The third injection site was 1 fingerbreadth superiorly and medially from the first injection site.  -Cervical paraspinal muscle injection, 2 sites, bilaterally. The first injection site was 1 cm from the midline of the cervical spine, 3 cm inferior to the lower border of the occipital protuberance. The second injection site was 1.5 cm superiorly and laterally to the first injection site.  -Trapezius muscle injection was performed at 3 sites, bilaterally. The first injection site was in the upper trapezius muscle halfway between the inflection point of the neck, and the acromion. The second injection site was one half way between the acromion and the first injection site. The third injection was done between the first injection site and the inflection point of the neck.  -Masseters: 2 sites bilaterally, 5 units each side   Will  return for repeat injection in 3 months.   A total of 200 units of Botox was prepared, 45 units wasted Botox not injected was wasted. The patient tolerated the procedure well, there were no complications of the above procedure.

## 2022-02-28 DIAGNOSIS — F4322 Adjustment disorder with anxiety: Secondary | ICD-10-CM | POA: Diagnosis not present

## 2022-03-03 ENCOUNTER — Other Ambulatory Visit: Payer: Self-pay | Admitting: Neurology

## 2022-03-03 DIAGNOSIS — G43711 Chronic migraine without aura, intractable, with status migrainosus: Secondary | ICD-10-CM

## 2022-03-04 ENCOUNTER — Telehealth: Payer: Self-pay | Admitting: *Deleted

## 2022-03-04 NOTE — Telephone Encounter (Signed)
Received PA request form for [employer] [place] QL EX completed form and records to [id number] with fax confirmation received.

## 2022-03-06 DIAGNOSIS — R102 Pelvic and perineal pain: Secondary | ICD-10-CM | POA: Diagnosis not present

## 2022-03-14 NOTE — Telephone Encounter (Signed)
Received a denial from [employer]. Jennifer Vincent is not approved when patient is also on Botox (patient is on Botox). Also note that it cannot be approved when patient is on any other CGRP as preventive (patient is not) and when the patient's frequent migraines are not explained by other medical conditions (such as unstable chest pain, history of stroke, or bleeding in the brain, etc).   If appeal is desired, fax within 180 calendar days to [phone].

## 2022-03-21 DIAGNOSIS — F4322 Adjustment disorder with anxiety: Secondary | ICD-10-CM | POA: Diagnosis not present

## 2022-03-25 ENCOUNTER — Other Ambulatory Visit: Payer: Self-pay | Admitting: Physician Assistant

## 2022-03-25 DIAGNOSIS — F411 Generalized anxiety disorder: Secondary | ICD-10-CM

## 2022-03-26 ENCOUNTER — Telehealth: Payer: Self-pay | Admitting: *Deleted

## 2022-03-26 NOTE — Telephone Encounter (Signed)
Completed renewal PA for Ubrelvy on CMM. Key: [id number]. Awaiting determination from [place].

## 2022-03-26 NOTE — Telephone Encounter (Signed)
Approved today Effective from 03/26/2022 through 03/25/2023.

## 2022-03-27 ENCOUNTER — Encounter: Payer: Self-pay | Admitting: Physician Assistant

## 2022-03-27 ENCOUNTER — Encounter: Payer: Self-pay | Admitting: *Deleted

## 2022-03-27 NOTE — Telephone Encounter (Signed)
Please see pt msg and advise if scheduling an appt is appropriate

## 2022-04-01 ENCOUNTER — Ambulatory Visit (INDEPENDENT_AMBULATORY_CARE_PROVIDER_SITE_OTHER): Payer: BC Managed Care – PPO | Admitting: Physician Assistant

## 2022-04-01 ENCOUNTER — Encounter: Payer: Self-pay | Admitting: Physician Assistant

## 2022-04-01 VITALS — BP 110/82 | HR 97 | Temp 97.7°F | Ht 68.0 in | Wt 201.0 lb

## 2022-04-01 DIAGNOSIS — F32A Depression, unspecified: Secondary | ICD-10-CM

## 2022-04-01 DIAGNOSIS — R61 Generalized hyperhidrosis: Secondary | ICD-10-CM

## 2022-04-01 DIAGNOSIS — G43711 Chronic migraine without aura, intractable, with status migrainosus: Secondary | ICD-10-CM | POA: Diagnosis not present

## 2022-04-01 DIAGNOSIS — D582 Other hemoglobinopathies: Secondary | ICD-10-CM

## 2022-04-01 DIAGNOSIS — F410 Panic disorder [episodic paroxysmal anxiety] without agoraphobia: Secondary | ICD-10-CM

## 2022-04-01 DIAGNOSIS — F419 Anxiety disorder, unspecified: Secondary | ICD-10-CM

## 2022-04-01 LAB — COMPREHENSIVE METABOLIC PANEL
ALT: 22 U/L (ref 0–35)
AST: 21 U/L (ref 0–37)
Albumin: 4.8 g/dL (ref 3.5–5.2)
Alkaline Phosphatase: 25 U/L — ABNORMAL LOW (ref 39–117)
BUN: 12 mg/dL (ref 6–23)
CO2: 28 mEq/L (ref 19–32)
Calcium: 9.9 mg/dL (ref 8.4–10.5)
Chloride: 103 mEq/L (ref 96–112)
Creatinine, Ser: 0.87 mg/dL (ref 0.40–1.20)
GFR: 88.24 mL/min (ref >60.00)
Glucose, Bld: 72 mg/dL (ref 70–99)
Potassium: 4.3 mEq/L (ref 3.5–5.1)
Sodium: 139 mEq/L (ref 135–145)
Total Bilirubin: 0.7 mg/dL (ref 0.2–1.2)
Total Protein: 7.6 g/dL (ref 6.0–8.3)

## 2022-04-01 LAB — POC URINALSYSI DIPSTICK (AUTOMATED)
Bilirubin, UA: NEGATIVE
Blood, UA: NEGATIVE
Glucose, UA: NEGATIVE
Ketones, UA: NEGATIVE
Leukocytes, UA: NEGATIVE
Nitrite, UA: NEGATIVE
Protein, UA: NEGATIVE
Spec Grav, UA: 1.01 (ref 1.010–1.025)
Urobilinogen, UA: 0.2 E.U./dL
pH, UA: 7 (ref 5.0–8.0)

## 2022-04-01 LAB — CBC WITH DIFFERENTIAL/PLATELET
Basophils Absolute: 0.1 10*3/uL (ref 0.0–0.1)
Basophils Relative: 0.9 % (ref 0.0–3.0)
Eosinophils Absolute: 0.1 10*3/uL (ref 0.0–0.7)
Eosinophils Relative: 0.8 % (ref 0.0–5.0)
HCT: 46.4 % — ABNORMAL HIGH (ref 36.0–46.0)
Hemoglobin: 16 g/dL — ABNORMAL HIGH (ref 12.0–15.0)
Lymphocytes Relative: 36.1 % (ref 12.0–46.0)
Lymphs Abs: 2.2 10*3/uL (ref 0.7–4.0)
MCHC: 34.4 g/dL (ref 30.0–36.0)
MCV: 94.1 fl (ref 78.0–100.0)
Monocytes Absolute: 0.3 10*3/uL (ref 0.1–1.0)
Monocytes Relative: 4.7 % (ref 3.0–12.0)
Neutro Abs: 3.5 10*3/uL (ref 1.4–7.7)
Neutrophils Relative %: 57.5 % (ref 43.0–77.0)
Platelets: 274 10*3/uL (ref 150.0–400.0)
RBC: 4.93 Mil/uL (ref 3.87–5.11)
RDW: 13.3 % (ref 11.5–15.5)
WBC: 6 10*3/uL (ref 4.0–10.5)

## 2022-04-01 LAB — C-REACTIVE PROTEIN: CRP: <1 mg/dL (ref 0.5–20.0)

## 2022-04-01 MED ORDER — HYDROXYZINE HCL 25 MG PO TABS: 25.0000 mg | ORAL_TABLET | Freq: Every evening | ORAL | 0 refills | Status: DC | PRN

## 2022-04-01 NOTE — Assessment & Plan Note (Signed)
Follows with Dr. Jaynee Eagles. Qulipta 60 mg daily has worked very well relieving her migraines.

## 2022-04-01 NOTE — Assessment & Plan Note (Signed)
Working with Social worker. Symptoms worst at night. Recently increasing Xanax use, but current dose is not helping, and she is worried about going up in dose / dependence issues in future. Will try hydroxyzine 25 mg at bedtime.

## 2022-04-01 NOTE — Assessment & Plan Note (Addendum)
Working with counselor Depression stable on Zoloft 100 mg daily Buspar 30 mg BID helps with anxiety stability

## 2022-04-01 NOTE — Patient Instructions (Signed)
Labs, urine today I will reach out to Dr. Jaynee Eagles  Try hydroxyzine at night in place of Xanax  Call sooner if any changes or concerns

## 2022-04-01 NOTE — Assessment & Plan Note (Signed)
Wonder about Qulipta side effect, data limited due to recent release of medication. I will reach out to Dr. Jaynee Eagles for advice. Check labs today, treat pending abnormal. Consider hematology.

## 2022-04-01 NOTE — Progress Notes (Signed)
Subjective:    Patient ID: Jennifer Vincent, female    DOB: Mar 01, 1991, 32 y.o.   MRN: [id number]  Chief Complaint  Patient presents with   Anxiety    Pt c/o night sweats and anxiety; feels like night sweats started a few months ago, had postpartum but now feels like it is happening nightly; with anxiety day to day is ok but worse at night especially before going to sleep. Wanting to discuss Xanax taking at night but night really working as well as the beginning.     HPI Patient is in today for concerns about night sweats and anxiety.  Last few months, every night waking up drenched and needing to change her clothes. States she did this postpartum as well, lasted a few months, about 18 months ago.  No abnormal weight loss; no fevers; no severe fatigue; no CP or SOB.   Only new medication is Lenoria Chime, which was started in October.  Still working for her migraines, doesn't want to change this med if possible.   Taking birth control continuously, doesn't get her cycle; no chance of pregnancy.  Taking Xanax 2-3 times per week at night, really having trouble falling asleep, winding brain down. Takes magnesium at night for help with anxiety and migraines. Buspar manages physical symptoms of anxiety throughout the day. She has been stable with Zoloft for quite some time.  Exercising five times per week, does this in the mornings. Intimacy is good. States she has a good wind-down routine at bedtime.     Past Medical History:  Diagnosis Date   Allergic rhinitis    Anxiety    Autoimmune disorder ([place])    nonspecified   COVID 04/05/2020   Frequent headaches    IBS (irritable bowel syndrome)    Migraine    PCOS (polycystic ovarian syndrome)     Past Surgical History:  Procedure Laterality Date   TONSILLECTOMY      Family History  Problem Relation Age of Onset   Depression Mother    Eating disorder Mother    Arthritis Father    Crohn's disease Father    Ulcerative colitis Father     Arthritis Brother    Alcohol abuse Brother    Mental illness Brother    Depression Brother    Anxiety disorder Brother    Crohn's disease Brother    Migraines Maternal Uncle    Breast cancer Maternal Grandmother    Mental illness Maternal Grandfather    Anxiety disorder Maternal Grandfather    OCD Maternal Grandfather    Heart disease Paternal Grandfather    Diabetes Paternal Grandfather    Heart attack Paternal Grandfather    Healthy Daughter    Healthy Son     Social History   Tobacco Use   Smoking status: Never    Passive exposure: Never   Smokeless tobacco: Never  Vaping Use   Vaping Use: Never used  Substance Use Topics   Alcohol use: Not Currently   Drug use: Never     Allergies  Allergen Reactions   Depakote [Divalproex Sodium]     Depression/suicidal thoughts   Lactose Intolerance (Gi)     GI upset    Review of Systems NEGATIVE UNLESS OTHERWISE INDICATED IN HPI      Objective:     BP 110/82 (BP Location: Left Arm)   Pulse 97   Temp 97.7 F (36.5 C) (Temporal)   Ht 5\' 8"  (1.727 m)   Wt 201 lb (91.2 kg)  SpO2 100%   BMI 30.56 kg/m   Wt Readings from Last 3 Encounters:  04/01/22 201 lb (91.2 kg)  02/19/22 200 lb 6.4 oz (90.9 kg)  01/30/22 205 lb (93 kg)    BP Readings from Last 3 Encounters:  04/01/22 110/82  02/19/22 118/76  02/10/22 114/79     Physical Exam Vitals and nursing note reviewed.  Constitutional:      Appearance: Normal appearance.  Cardiovascular:     Rate and Rhythm: Normal rate and regular rhythm.  Pulmonary:     Effort: Pulmonary effort is normal.     Breath sounds: Normal breath sounds.  Neurological:     General: No focal deficit present.     Mental Status: She is alert and oriented to person, place, and time.  Psychiatric:        Mood and Affect: Mood normal. Affect is tearful.        Assessment & Plan:  Unexplained night sweats Assessment & Plan: Wonder about Qulipta side effect, data limited due to  recent release of medication. I will reach out to Dr. Jaynee Eagles for advice. Check labs today, treat pending abnormal. Consider hematology.   Orders: -     CBC with Differential/Platelet -     Comprehensive metabolic panel -     Thyroid Panel With TSH -     C-reactive protein -     POCT Urinalysis Dipstick (Automated)  Anxiety and depression Assessment & Plan: Working with counselor Depression stable on Zoloft 100 mg daily Buspar 30 mg BID helps with anxiety stability     Panic disorder Assessment & Plan: Working with counselor. Symptoms worst at night. Recently increasing Xanax use, but current dose is not helping, and she is worried about going up in dose / dependence issues in future. Will try hydroxyzine 25 mg at bedtime.    Chronic migraine without aura, with intractable migraine, so stated, with status migrainosus Assessment & Plan: Follows with Dr. Jaynee Eagles. Qulipta 60 mg daily has worked very well relieving her migraines.    Other orders -     hydrOXYzine HCl; Take 1 tablet (25 mg total) by mouth at bedtime as needed for anxiety.  Dispense: 15 tablet; Refill: 0        Return in about 3 weeks (around 04/22/2022) for recheck .  This note was prepared with assistance of Systems analyst. Occasional wrong-word or sound-a-like substitutions may have occurred due to the inherent limitations of voice recognition software.  Time Spent: 45 minutes of total time was spent on the date of the encounter performing the following actions: chart review prior to seeing the patient, obtaining history, performing a medically necessary exam, counseling on the treatment plan, placing orders, and documenting in our EHR.       Tiasia Weberg M Ion Gonnella, PA-C

## 2022-04-02 ENCOUNTER — Other Ambulatory Visit: Payer: Self-pay | Admitting: Physician Assistant

## 2022-04-02 DIAGNOSIS — F411 Generalized anxiety disorder: Secondary | ICD-10-CM

## 2022-04-02 LAB — THYROID PANEL WITH TSH
Free Thyroxine Index: 2.1 (ref 1.4–3.8)
T3 Uptake: 29 % (ref 22–35)
T4, Total: 7.3 ug/dL (ref 5.1–11.9)
TSH: 0.8 mIU/L

## 2022-04-04 ENCOUNTER — Telehealth: Payer: Self-pay | Admitting: Neurology

## 2022-04-04 NOTE — Telephone Encounter (Signed)
New Botox auth needed, current one on file expired 04/09/22. Appt notes do state that samples will be used for this appt.

## 2022-04-04 NOTE — Telephone Encounter (Signed)
Chronic Migraine CPT 64615  Botox J0585 Units:200  G43.711 Chronic Migraine without aura, intractable, with status migrainous  Need insurance re-auth for botox

## 2022-04-11 NOTE — Telephone Encounter (Signed)
We do have some samples. We can attempt to reach pt's insurance to check status on appeal or call pharmacy to see if they can get a paid claim first.

## 2022-04-11 NOTE — Telephone Encounter (Signed)
[facility] was unable to process. Still says needs PA.

## 2022-04-12 DIAGNOSIS — R0981 Nasal congestion: Secondary | ICD-10-CM | POA: Diagnosis not present

## 2022-04-12 DIAGNOSIS — R52 Pain, unspecified: Secondary | ICD-10-CM | POA: Diagnosis not present

## 2022-04-12 DIAGNOSIS — J029 Acute pharyngitis, unspecified: Secondary | ICD-10-CM | POA: Diagnosis not present

## 2022-04-15 ENCOUNTER — Other Ambulatory Visit (HOSPITAL_COMMUNITY): Payer: Self-pay

## 2022-04-15 NOTE — Telephone Encounter (Signed)
Spoke with [employer] to check status of appeal. I was told there was a consent form the patient was supposed to complete and there's another form evidently that will be faxed to our office. I will update the patient.

## 2022-04-15 NOTE — Telephone Encounter (Signed)
Appeal faxed to [facility]. This includes pt's form signed that authorizes [place] to appeal plus the physician form, letter, office notes, and research study from [place]. Received a receipt of confirmation.

## 2022-04-18 MED ORDER — QULIPTA 60 MG PO TABS: 60.0000 mg | ORAL_TABLET | Freq: Every day | ORAL | 0 refills | Status: DC

## 2022-04-18 NOTE — Telephone Encounter (Signed)
Have they given you any update on this?

## 2022-04-18 NOTE — Telephone Encounter (Addendum)
Faxed the appeal levels 2 to [place]. Received a receipt of confirmation. Per Dr Jaynee Eagles, while awaiting appeal determination, can provide Qulipta samples to patient. I have them ready for pt pickup when she comes into the office next Monday 04/22/22 and placed order.   Lot: [id number] Exp: 09/2023 NDC: [id number]

## 2022-04-18 NOTE — Telephone Encounter (Signed)
I faxed a request for an update on urgent Lenoria Chime appeal to both [place] appeal #s  [id number] and [phone]. Received a receipt of confirmation.

## 2022-04-18 NOTE — Telephone Encounter (Addendum)
Go ahead with Botox PA. [employer] denied our appeal for Jennifer Vincent so you should be able to get Botox approved with no problem.  We are doing samples at the 2/5 visit so we just need the PA before her next appointment 8 weeks later.

## 2022-04-18 NOTE — Addendum Note (Signed)
Addended by: Gildardo Griffes on: 04/18/2022 03:46 PM   Modules accepted: Orders

## 2022-04-18 NOTE — Telephone Encounter (Signed)
Received fax back from [facility]. Our appeal is denied. [employer]BS is standing firm with decision that they will not pay for Qulipta while patient is on Botox.

## 2022-04-19 ENCOUNTER — Encounter: Payer: Self-pay | Admitting: Physician Assistant

## 2022-04-22 ENCOUNTER — Other Ambulatory Visit: Payer: Self-pay | Admitting: *Deleted

## 2022-04-22 ENCOUNTER — Other Ambulatory Visit (HOSPITAL_COMMUNITY): Payer: Self-pay

## 2022-04-22 ENCOUNTER — Ambulatory Visit (INDEPENDENT_AMBULATORY_CARE_PROVIDER_SITE_OTHER): Payer: BC Managed Care – PPO | Admitting: Neurology

## 2022-04-22 DIAGNOSIS — G43711 Chronic migraine without aura, intractable, with status migrainosus: Secondary | ICD-10-CM

## 2022-04-22 MED ORDER — KETOROLAC TROMETHAMINE 60 MG/2ML IM SOLN
60.0000 mg | Freq: Once | INTRAMUSCULAR | Status: AC
  Administered 2022-04-22: 60 mg via INTRAMUSCULAR

## 2022-04-22 NOTE — Telephone Encounter (Signed)
Called pt and lvm for pt tomorrow morning appt cancelled and can be rescheduled after hematology appt. Left cb number for office if patient needed or wanted to return my call.

## 2022-04-22 NOTE — Telephone Encounter (Signed)
BOTOX ONE-Benefit Verification [id number] Submitted!

## 2022-04-22 NOTE — Telephone Encounter (Signed)
Pt told Jennifer Vincent she was given samples of Qulipta by Dr Jaynee Eagles this AM. I put these back in the stock for now. We can provide samples at later date if ok with Dr Jaynee Eagles and patient is still on the medication.

## 2022-04-22 NOTE — Telephone Encounter (Signed)
Please advise if you would like patient to reschedule appt for another date after hematology appt, thanks

## 2022-04-22 NOTE — Progress Notes (Signed)
Verbal order for toradol 60mg  IM.  Under aseptic technique toradol 60mg  IM given R upper outer quadrant (gluteal).  Bandaide applied.  Pt tolerated well.  This for migraine.

## 2022-04-22 NOTE — Progress Notes (Signed)
Consent Form Botulism Toxin Injection For Chronic Migraine  I spent 30 minutes of face-to-face and non-face-to-face time with patient on the  1. Chronic migraine without aura, with intractable migraine, so stated, with status migrainosus    diagnosis.  This included previsit chart review, lab review, study review, order entry, electronic health record documentation, patient education on the different diagnostic and therapeutic options, counseling and coordination of care, risks and benefits of management, compliance, or risk factor reduction and procedure including toradol injection. Trying to get her qulipta approved with botox, she hasn;t had a migraine for 3.5 weeks since being on botox and qulipta.     04/22/2022: Stable doing well on botox and quilipta. Samples botox, spent time writing qulipta/botox request fo rboth insurnce is giving [place] problems approving both but the combination has been spectacular for her   02/25/2022: Doing well 5 migraine days a month and the [place] helps acutely. +a. Next would try Vyepti. May move botox out to every 10 weeks next time. SAMPLES TODAY bill by time  Reviewed orally with patient, additionally signature is on file:  Botulism toxin has been approved by the Federal drug administration for treatment of chronic migraine. Botulism toxin does not cure chronic migraine and it may not be effective in some patients.  The administration of botulism toxin is accomplished by injecting a small amount of toxin into the muscles of the neck and head. Dosage must be titrated for each individual. Any benefits resulting from botulism toxin tend to wear off after 3 months with a repeat injection required if benefit is to be maintained. Injections are usually done every 3-4 months with maximum effect peak achieved by about 2 or 3 weeks. Botulism toxin is expensive and you should be sure of what costs you will incur resulting from the injection.  The side  effects of botulism toxin use for chronic migraine may include:   -Transient, and usually mild, facial weakness with facial injections  -Transient, and usually mild, head or neck weakness with head/neck injections  -Reduction or loss of forehead facial animation due to forehead muscle weakness  -Eyelid drooping  -Dry eye  -Pain at the site of injection or bruising at the site of injection  -Double vision  -Potential unknown long term risks   Contraindications: You should not have Botox if you are pregnant, nursing, allergic to albumin, have an infection, skin condition, or muscle weakness at the site of the injection, or have myasthenia gravis, Lambert-Eaton syndrome, or ALS.  It is also possible that as with any injection, there may be an allergic reaction or no effect from the medication. Reduced effectiveness after repeated injections is sometimes seen and rarely infection at the injection site may occur. All care will be taken to prevent these side effects. If therapy is given over a long time, atrophy and wasting in the muscle injected may occur. Occasionally the patient's become refractory to treatment because they develop antibodies to the toxin. In this event, therapy needs to be modified.  I have read the above information and consent to the administration of botulism toxin.    BOTOX PROCEDURE NOTE FOR MIGRAINE HEADACHE  Contraindications and precautions discussed with patient(above). Aseptic procedure was observed and patient tolerated procedure. Procedure performed by Frann Rider, NP-BC.   The condition has existed for more than 6 months, and pt does not have a diagnosis of ALS, Myasthenia Gravis or Lambert-Eaton Syndrome.  Risks and benefits of injections discussed and pt  agrees to proceed with the procedure.  Written consent obtained  These injections are medically necessary. Pt  receives good benefits from these injections. These injections do not cause sedations or  hallucinations which the oral therapies may cause.   Description of procedure:  The patient was placed in a sitting position. The standard protocol was used for Botox as follows, with 5 units of Botox injected at each site:  -Procerus muscle, midline injection  -Corrugator muscle, bilateral injection  -Frontalis muscle, bilateral injection, with 2 sites each side, medial injection was performed in the upper one third of the frontalis muscle, in the region vertical from the medial inferior edge of the superior orbital rim. The lateral injection was again in the upper one third of the forehead vertically above the lateral limbus of the cornea, 1.5 cm lateral to the medial injection site.  -Temporalis muscle injection, 4 sites, bilaterally. The first injection was 3 cm above the tragus of the ear, second injection site was 1.5 cm to 3 cm up from the first injection site in line with the tragus of the ear. The third injection site was 1.5-3 cm forward between the first 2 injection sites. The fourth injection site was 1.5 cm posterior to the second injection site. 5th site laterally in the temporalis  muscleat the level of the outer canthus.  -Occipitalis muscle injection, 3 sites, bilaterally. The first injection was done one half way between the occipital protuberance and the tip of the mastoid process behind the ear. The second injection site was done lateral and superior to the first, 1 fingerbreadth from the first injection. The third injection site was 1 fingerbreadth superiorly and medially from the first injection site.  -Cervical paraspinal muscle injection, 2 sites, bilaterally. The first injection site was 1 cm from the midline of the cervical spine, 3 cm inferior to the lower border of the occipital protuberance. The second injection site was 1.5 cm superiorly and laterally to the first injection site.  -Trapezius muscle injection was performed at 3 sites, bilaterally. The first injection  site was in the upper trapezius muscle halfway between the inflection point of the neck, and the acromion. The second injection site was one half way between the acromion and the first injection site. The third injection was done between the first injection site and the inflection point of the neck.  -Masseters: 2 sites bilaterally, 5 units each side   Will return for repeat injection in 3 months.   A total of 200 units of Botox was prepared, 45 units wasted Botox not injected was wasted. The patient tolerated the procedure well, there were no complications of the above procedure.

## 2022-04-22 NOTE — Telephone Encounter (Signed)
Pharmacy Patient Advocate Encounter   Received notification from [place] that prior authorization for Botox 200UNIT solution is required/requested.   PA submitted on 04/22/2022 to (ins) [employer] [place] [employer] via [employer] [id number] Status is pending

## 2022-04-22 NOTE — Addendum Note (Signed)
Addended by: Brandon Melnick on: 04/22/2022 08:58 AM   Modules accepted: Orders

## 2022-04-22 NOTE — Progress Notes (Signed)
Botox- 200 units x 1 vial Lot: [id number] Expiration: 05/2024 NDC: 7829-5621-30  Bacteriostatic 0.9% Sodium Chloride- 15mL total Lot: [id number] Expiration: 11/25 NDC: 96295-284-13  Dx: K44.010 Samples

## 2022-04-23 ENCOUNTER — Inpatient Hospital Stay: Payer: BC Managed Care – PPO

## 2022-04-23 ENCOUNTER — Inpatient Hospital Stay: Payer: BC Managed Care – PPO | Attending: Physician Assistant | Admitting: Physician Assistant

## 2022-04-23 ENCOUNTER — Ambulatory Visit: Payer: BC Managed Care – PPO | Admitting: Physician Assistant

## 2022-04-23 VITALS — BP 131/90 | HR 99 | Temp 97.9°F | Resp 17 | Wt 199.2 lb

## 2022-04-23 DIAGNOSIS — D751 Secondary polycythemia: Secondary | ICD-10-CM

## 2022-04-23 DIAGNOSIS — Z803 Family history of malignant neoplasm of breast: Secondary | ICD-10-CM | POA: Diagnosis not present

## 2022-04-23 DIAGNOSIS — R61 Generalized hyperhidrosis: Secondary | ICD-10-CM | POA: Insufficient documentation

## 2022-04-23 LAB — CBC WITH DIFFERENTIAL (CANCER CENTER ONLY)
Abs Immature Granulocytes: 0.01 10*3/uL (ref 0.00–0.07)
Basophils Absolute: 0.1 10*3/uL (ref 0.0–0.1)
Basophils Relative: 1 %
Eosinophils Absolute: 0 10*3/uL (ref 0.0–0.5)
Eosinophils Relative: 0 %
HCT: 42.2 % (ref 36.0–46.0)
Hemoglobin: 15.2 g/dL — ABNORMAL HIGH (ref 12.0–15.0)
Immature Granulocytes: 0 %
Lymphocytes Relative: 40 %
Lymphs Abs: 2.2 10*3/uL (ref 0.7–4.0)
MCH: 33 pg (ref 26.0–34.0)
MCHC: 36 g/dL (ref 30.0–36.0)
MCV: 91.5 fL (ref 80.0–100.0)
Monocytes Absolute: 0.3 10*3/uL (ref 0.1–1.0)
Monocytes Relative: 5 %
Neutro Abs: 3 10*3/uL (ref 1.7–7.7)
Neutrophils Relative %: 54 %
Platelet Count: 278 10*3/uL (ref 150–400)
RBC: 4.61 MIL/uL (ref 3.87–5.11)
RDW: 12.2 % (ref 11.5–15.5)
WBC Count: 5.5 10*3/uL (ref 4.0–10.5)
nRBC: 0 % (ref 0.0–0.2)

## 2022-04-23 LAB — CMP (CANCER CENTER ONLY)
ALT: 22 U/L (ref 0–44)
AST: 19 U/L (ref 15–41)
Albumin: 4.4 g/dL (ref 3.5–5.0)
Alkaline Phosphatase: 24 U/L — ABNORMAL LOW (ref 38–126)
Anion gap: 6 (ref 5–15)
BUN: 9 mg/dL (ref 6–20)
CO2: 28 mmol/L (ref 22–32)
Calcium: 9.4 mg/dL (ref 8.9–10.3)
Chloride: 105 mmol/L (ref 98–111)
Creatinine: 0.83 mg/dL (ref 0.44–1.00)
GFR, Estimated: >60 mL/min (ref >60)
Glucose, Bld: 88 mg/dL (ref 70–99)
Potassium: 3.8 mmol/L (ref 3.5–5.1)
Sodium: 139 mmol/L (ref 135–145)
Total Bilirubin: 0.5 mg/dL (ref 0.3–1.2)
Total Protein: 7.1 g/dL (ref 6.5–8.1)

## 2022-04-23 LAB — SEDIMENTATION RATE: Sed Rate: 0 mm/hr (ref 0–22)

## 2022-04-23 LAB — LACTATE DEHYDROGENASE: LDH: 166 U/L (ref 98–192)

## 2022-04-23 NOTE — Telephone Encounter (Signed)
Received fax from [facility] for [place] under key # [id number]. I spoke with Latricia Heft @ [place]. That fax was sent inadvertently so it has been disregarded. The appeal level 2 that we sent on 2/1 has been received and there are 39 calendar days left for review. The patient is able to send in a personal letter for the [facility] and he requested that the patient put the appeal ID on the letter. ID: [id number] including her name and dob, then fax to the appeals department [phone]). I was also told that [place] will allow peer to peer review with pharmacists after denial received but they do not have this listed on the denial letter. He explained that if Lenoria Chime gets approved then future Botox authorizations will be denied but then we just need to call a peer to peer after the denial and speak with a pharmacist.

## 2022-04-23 NOTE — Progress Notes (Signed)
[place] Telephone:(336) [phone]   Fax:(336) [place] NOTE  Patient Care Team: Allwardt, Randa Evens, PA-C as PCP - General (Physician Assistant) Jonna Clark, MD as Referring Physician (Obstetrics and Gynecology) Rosana Berger, MD as Referring Physician (Gastroenterology)  Hematological/Oncological History 01/30/2022: WBC 4.9, Hgb 15.7 (H), Hct 45.9% (H), Plt 247 02/10/2022: WBC 6.0, Hgb 16.3 (H), Hct 47.4% (H), Plt 267 02/19/2022: WBC 6.1, Hgb 14.5, Hct 41.8%, Plt 235 04/01/2022: WBC 6.0, Hgb 15.0 (H), Hct 46.4% (H), Plt 274 04/23/2022: Establish care with [facility] Hematology  CHIEF COMPLAINTS/PURPOSE OF CONSULTATION:  "Polycythemia, night sweats"  HISTORY OF PRESENTING ILLNESS:  Jennifer Vincent 32 y.o. female with medical history significant for IBS, PCOS, unspecified autoimmune disorder, migraines and anxiety presents to the hematology clinic for evaluation of polycythemia accompanied by night sweats. She is accompanied by her mother for this visit.   On exam today, Mrs. Affinito reports experiencing some fatigue but is still able to complete all her ADLs independently. She reports some appetite loss and approximately 25 lb weight loss since October 2023. She adds that some of this weight loss was intentional. She reports occasional episodes of nausea without vomiting episodes. She denies abdominal pain and bowel habits are fairly normal. She denies easy bruising or signs of active bleeding. She reports experiencing intermittent episodes of night sweats for the last 2-3 months. Some night she has drenching sweats requiring her to change her clothes. She denies fevers, chills, shortness of breath, chest pain or cough. She has no other complaints. Rest of the 10 point ROS is below.   MEDICAL HISTORY:  Past Medical History:  Diagnosis Date   Allergic rhinitis    Anxiety    Autoimmune disorder ([place])    nonspecified   COVID 04/05/2020   Frequent headaches     IBS (irritable bowel syndrome)    Migraine    PCOS (polycystic ovarian syndrome)     SURGICAL HISTORY: Past Surgical History:  Procedure Laterality Date   TONSILLECTOMY      SOCIAL HISTORY: Social History   Socioeconomic History   Marital status: Married    Spouse name: Not on file   Number of children: 1   Years of education: Not on file   Highest education level: Master's degree (e.g., MA, MS, MEng, MEd, MSW, MBA)  Occupational History   Not on file  Tobacco Use   Smoking status: Never    Passive exposure: Never   Smokeless tobacco: Never  Vaping Use   Vaping Use: Never used  Substance and Sexual Activity   Alcohol use: Not Currently   Drug use: Never   Sexual activity: Yes    Birth control/protection: None  Other Topics Concern   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      Home Situation: lives with husband and daughter      Spiritual Beliefs: Jewish      Lifestyle:  regular, aerobic and orange theory; diet healthy       Update: uses peloton 5x a week      Right handed      Caffeine: 1 cup/day   Social Determinants of Health   Financial Resource Strain: Low Risk  (05/04/2021)   Overall Financial Resource Strain (CARDIA)    Difficulty of Paying Living Expenses: Not hard at all  Food Insecurity: No Food Insecurity (04/23/2022)   Hunger Vital Sign    Worried About Running Out of Food  in the Last Year: Never true    [place] in the Last Year: Never true  Transportation Needs: No Transportation Needs (04/23/2022)   PRAPARE - Hydrologist (Medical): No    Lack of Transportation (Non-Medical): No  Physical Activity: Insufficiently Active (05/04/2021)   Exercise Vital Sign    Days of Exercise per Week: 4 days    Minutes of Exercise per Session: 30 min  Stress: No Stress Concern Present (05/04/2021)   [place]     Feeling of Stress : Only a little  Social Connections: Socially Integrated (05/04/2021)   Social Connection and Isolation Panel [NHANES]    Frequency of Communication with Friends and Family: More than three times a week    Frequency of Social Gatherings with Friends and Family: More than three times a week    Attends Religious Services: 1 to 4 times per year    Active Member of [employer] or Organizations: Yes    Attends Music therapist: More than 4 times per year    Marital Status: Married  Human resources officer Violence: Not At Risk (04/23/2022)   Humiliation, Afraid, Rape, and Kick questionnaire    Fear of Current or Ex-Partner: No    Emotionally Abused: No    Physically Abused: No    Sexually Abused: No    FAMILY HISTORY: Family History  Problem Relation Age of Onset   Depression Mother    Eating disorder Mother    Arthritis Father    Crohn's disease Father    Ulcerative colitis Father    Arthritis Brother    Alcohol abuse Brother    Mental illness Brother    Depression Brother    Anxiety disorder Brother    Crohn's disease Brother    Migraines Maternal Uncle    Breast cancer Maternal Grandmother    Mental illness Maternal Grandfather    Anxiety disorder Maternal Grandfather    OCD Maternal Grandfather    Heart disease Paternal Grandfather    Diabetes Paternal Grandfather    Heart attack Paternal Grandfather    Healthy Daughter    Healthy Son     ALLERGIES:  is allergic to depakote [divalproex sodium] and lactose intolerance (gi).  MEDICATIONS:  Current Outpatient Medications  Medication Sig Dispense Refill   ALPRAZolam (XANAX) 0.25 MG tablet TAKE 1 TABLET(0.25 MG) BY MOUTH TWICE DAILY AS NEEDED FOR ANXIETY 20 tablet 2   Atogepant (QULIPTA) 60 MG TABS Take 1 tablet (60 mg total) by mouth daily. 60 tablet 0   busPIRone (BUSPAR) 15 MG tablet TAKE 2 TABLETS(30 MG) BY MOUTH TWICE DAILY 360 tablet 0   Celecoxib (ELYXYB) 120 MG/4.8ML SOLN Take 120 mg by mouth daily  as needed. One dose maximum daily. 28.8 mL 11   hydroxychloroquine (PLAQUENIL) 200 MG tablet Take 1 tablet (200 mg total) by mouth 2 (two) times daily. 180 tablet 0   hydrOXYzine (ATARAX) 25 MG tablet Take 1 tablet (25 mg total) by mouth at bedtime as needed for anxiety. 15 tablet 0   JUNEL FE 1/20 1-20 MG-MCG tablet Take 1 tablet by mouth daily.     ondansetron (ZOFRAN) 4 MG tablet Take 1 tablet (4 mg total) by mouth every 8 (eight) hours as needed for nausea or vomiting. 20 tablet 0   sertraline (ZOLOFT) 100 MG tablet TAKE 1 TABLET(100 MG) BY MOUTH DAILY 90 tablet 2   Ubrogepant (UBRELVY) 100 MG TABS Take  1 tablet onset migraine, may take another 2 hours later if needed. 16 tablet 5   Current Facility-Administered Medications  Medication Dose Route Frequency Provider Last Rate Last Admin   botulinum toxin Type A (BOTOX) injection 155 Units  155 Units Intramuscular Once Melvenia Beam, MD       botulinum toxin Type A (BOTOX) injection 155 Units  155 Units Intramuscular Once Melvenia Beam, MD        REVIEW OF SYSTEMS:   Constitutional: ( - ) fevers, ( - )  chills , ( = ) night sweats Eyes: ( - ) blurriness of vision, ( - ) double vision, ( - ) watery eyes Ears, nose, mouth, throat, and face: ( - ) mucositis, ( - ) sore throat Respiratory: ( - ) cough, ( - ) dyspnea, ( - ) wheezes Cardiovascular: ( - ) palpitation, ( - ) chest discomfort, ( - ) lower extremity swelling Gastrointestinal:  ( +) nausea, ( - ) heartburn, ( - ) change in bowel habits Skin: ( - ) abnormal skin rashes Lymphatics: ( - ) new lymphadenopathy, ( - ) easy bruising Neurological: ( - ) numbness, ( - ) tingling, ( - ) new weaknesses Behavioral/Psych: ( - ) mood change, ( - ) new changes  All other systems were reviewed with the patient and are negative.  PHYSICAL EXAMINATION: ECOG PERFORMANCE STATUS: 1 - Symptomatic but completely ambulatory  Vitals:   04/23/22 1404  BP: (!) 131/90  Pulse: 99  Resp: 17  Temp:  97.9 F (36.6 C)  SpO2: 100%   Filed Weights   04/23/22 1404  Weight: 199 lb 3.2 oz (90.4 kg)    GENERAL: well appearing female in NAD  SKIN: skin color, texture, turgor are normal, no rashes or significant lesions EYES: conjunctiva are pink and non-injected, sclera clear OROPHARYNX: no exudate, no erythema; lips, buccal mucosa, and tongue normal  NECK: supple, non-tender LYMPH:  no palpable lymphadenopathy in the cervical or supraclavicular lymph nodes.  LUNGS: clear to auscultation and percussion with normal breathing effort HEART: regular rate & rhythm and no murmurs and no lower extremity edema ABDOMEN: soft, non-tender, non-distended, normal bowel sounds Musculoskeletal: no cyanosis of digits and no clubbing  PSYCH: alert & oriented x 3, fluent speech NEURO: no focal motor/sensory deficits  LABORATORY DATA:  I have reviewed the data as listed    Latest Ref Rng & Units 04/23/2022    2:53 PM 04/01/2022    8:58 AM 02/19/2022   11:47 AM  CBC  WBC 4.0 - 10.5 K/uL 5.5  6.0  6.1   Hemoglobin 12.0 - 15.0 g/dL 15.2  16.0  14.5   Hematocrit 36.0 - 46.0 % 42.2  46.4  41.8   Platelets 150 - 400 K/uL 278  274.0  235.0        Latest Ref Rng & Units 04/23/2022    2:53 PM 04/01/2022    8:58 AM 02/10/2022    9:15 AM  CMP  Glucose 70 - 99 mg/dL 88  72  106   BUN 6 - 20 mg/dL 9  12  5   $ Creatinine 0.44 - 1.00 mg/dL 0.83  0.87  1.00   Sodium 135 - 145 mmol/L 139  139  140   Potassium 3.5 - 5.1 mmol/L 3.8  4.3  4.0   Chloride 98 - 111 mmol/L 105  103  102   CO2 22 - 32 mmol/L 28  28  24   $ Calcium 8.9 -  10.3 mg/dL 9.4  9.9  9.7   Total Protein 6.5 - 8.1 g/dL 7.1  7.6    Total Bilirubin 0.3 - 1.2 mg/dL 0.5  0.7    Alkaline Phos 38 - 126 U/L 24  25    AST 15 - 41 U/L 19  21    ALT 0 - 44 U/L 22  22       ASSESSMENT & PLAN Jennifer Vincent is a 32 y.o. female who presents to the clinic for evaluation of polycythemia accompanied with night sweats.   There are two types of polycythemia,  Primary polycythemia and secondary polycythemia. Primary polycythemia is overproduction of red blood cells due to a driver mutation. The most common mutation is the JAK2 V617F (95% of cases), but there are other mutations which can cause this disorder. Primary polycythemia is a myeloproliferative neoplasm which may require cytoreductive therapy to decrease risk of thrombosis. This can consist of medications or phlebotomy to drive down the red blood cell counts. Secondary polycythemia is polycythemia driven by low oxygen levels. This represents an appropriate response of the body attempting to increase red cell volume. Causes of secondary polycythemia include smoking (most common), obstructive sleep apnea (OSA), or living at altitude. This can also be caused by testosterone supplementation. Certain thalassemias can present with marked erythrocytosis , but normal hemoglobin. Secondary polycythemia does not have the same level of thrombotic risk and therefore does not require cytoreductive therapy or phlebotomy.   #Polycythemia: --workup to include CBC, CMP, reticulocyte count, and erythropoietin level   --patient is a non smoker and does not use any testosterone containing products --will order MPN workup to include JAK2 with reflex and BCR/ABL FISH --if above workup is negative, we will refer patient for sleep study to evaluate for obstructive sleep apnea.  --RTC in 6 months or sooner if indicated by the above labs.   #Night sweats: --Etiology unknown but discussed differentials including medication induced, hormone changes, or an  inflammatory process  --Labs today to check inflammatory markers and LDH levels  Orders Placed This Encounter  Procedures   CBC with Differential (Cancer Center Only)    Standing Status:   Future    Number of Occurrences:   1    Standing Expiration Date:   04/24/2023   CMP ([facility] only)    Standing Status:   Future    Number of Occurrences:   1    Standing  Expiration Date:   04/24/2023   JAK2 (INCLUDING V617F AND EXON 12), MPL,& CALR W/RFL MPN PANEL (NGS)    Standing Status:   Future    Number of Occurrences:   1    Standing Expiration Date:   04/23/2023   BCR ABL1 FISH ([facility])    Standing Status:   Future    Number of Occurrences:   1    Standing Expiration Date:   04/24/2023   Erythropoietin    Standing Status:   Future    Number of Occurrences:   1    Standing Expiration Date:   04/23/2023   Sedimentation rate    Standing Status:   Future    Number of Occurrences:   1    Standing Expiration Date:   04/23/2023   Lactate dehydrogenase (LDH)    Standing Status:   Future    Number of Occurrences:   1    Standing Expiration Date:   04/23/2023    All questions were answered. The patient knows to call the clinic  with any problems, questions or concerns.  I have spent a total of 60 minutes minutes of face-to-face and non-face-to-face time, preparing to see the patient, obtaining and/or reviewing separately obtained history, performing a medically appropriate examination, counseling and educating the patient, ordering tests/procedures, referring and communicating with other health care professionals, documenting clinical information in the electronic health record, and care coordination.   Dede Query, PA-C Department of Hematology/Oncology [place] at [facility] Phone: [phone]  Patient was seen with Dr. Lorenso Courier  I have read the above note and personally examined the patient. I agree with the assessment and plan as noted above.  Briefly Mr. Jennifer Vincent is a 32 year old female with medical history significant for night sweats and polycythemia.  At this time the etiology of her polycythemia is unclear.  Potential etiologies include obstructive sleep apnea or myeloproliferative neoplasm.  We rule out MPN with JAK2 with reflex panel as well as BCR/ABL FISH.  In the event this testing is negative would recommend pursuing a  sleep study.  In terms of her night sweats the etiology is unclear and may not be related to her polycythemia.  Would recommend ordering inflammatory markers as well as LDH.  Will plan to see the patient back pending the results of the above studies.   Ledell Peoples, MD Department of Hematology/Oncology [place] at [facility] Phone: [phone] Pager: [phone] Email: [email or web address]

## 2022-04-24 ENCOUNTER — Other Ambulatory Visit: Payer: Self-pay | Admitting: Physician Assistant

## 2022-04-24 ENCOUNTER — Telehealth: Payer: Self-pay | Admitting: Physician Assistant

## 2022-04-24 LAB — ERYTHROPOIETIN: Erythropoietin: 5.8 m[IU]/mL (ref 2.6–18.5)

## 2022-04-24 NOTE — Telephone Encounter (Signed)
Contacted patient to scheduled appointments. Left message with appointment details and a call back number if patient had any questions or could not accommodate the time we provided.   

## 2022-04-26 ENCOUNTER — Other Ambulatory Visit (HOSPITAL_COMMUNITY): Payer: Self-pay

## 2022-04-26 NOTE — Telephone Encounter (Signed)
Pharmacy Patient Advocate Encounter  Prior Authorization for Botox 200UNIT solution has been approved.    PA# N/A Effective dates: 04/22/2022 through 03/23/2023  This is [employer]

## 2022-04-29 NOTE — Telephone Encounter (Signed)
Noted thanks. Appt note has been updated for next injection.

## 2022-04-30 DIAGNOSIS — R109 Unspecified abdominal pain: Secondary | ICD-10-CM | POA: Diagnosis not present

## 2022-04-30 DIAGNOSIS — M62838 Other muscle spasm: Secondary | ICD-10-CM | POA: Diagnosis not present

## 2022-04-30 DIAGNOSIS — K5900 Constipation, unspecified: Secondary | ICD-10-CM | POA: Diagnosis not present

## 2022-05-01 ENCOUNTER — Encounter: Payer: Self-pay | Admitting: Physician Assistant

## 2022-05-02 ENCOUNTER — Other Ambulatory Visit: Payer: Self-pay | Admitting: Physician Assistant

## 2022-05-02 DIAGNOSIS — R221 Localized swelling, mass and lump, neck: Secondary | ICD-10-CM

## 2022-05-02 DIAGNOSIS — F4322 Adjustment disorder with anxiety: Secondary | ICD-10-CM | POA: Diagnosis not present

## 2022-05-03 ENCOUNTER — Ambulatory Visit (HOSPITAL_COMMUNITY)
Admission: RE | Admit: 2022-05-03 | Discharge: 2022-05-03 | Disposition: A | Discharge status: Home / Self Care | Payer: BC Managed Care – PPO | Source: Ambulatory Visit | Attending: Physician Assistant | Admitting: Physician Assistant

## 2022-05-03 DIAGNOSIS — Z0389 Encounter for observation for other suspected diseases and conditions ruled out: Secondary | ICD-10-CM | POA: Diagnosis not present

## 2022-05-03 DIAGNOSIS — R221 Localized swelling, mass and lump, neck: Secondary | ICD-10-CM | POA: Insufficient documentation

## 2022-05-03 DIAGNOSIS — R591 Generalized enlarged lymph nodes: Secondary | ICD-10-CM | POA: Diagnosis not present

## 2022-05-03 LAB — JAK2 (INCLUDING V617F AND EXON 12), MPL,& CALR W/RFL MPN PANEL (NGS)

## 2022-05-03 LAB — BCR ABL1 FISH (GENPATH)

## 2022-05-06 ENCOUNTER — Telehealth: Payer: Self-pay | Admitting: Physician Assistant

## 2022-05-06 DIAGNOSIS — D751 Secondary polycythemia: Secondary | ICD-10-CM

## 2022-05-06 NOTE — Telephone Encounter (Signed)
I called Ms. Blakelynn Sekelsky to review the lab results from 04/23/2022. Findings show improvement of polycythemia with Hgb of 15.2. There is no evidence of myeloproliferative neoplasm so this is most consistent with secondary polycythemia. Recommend evaluation for obstructive sleep apnea so will make referral today. We will see patient back in 6 months to follow up. Ms. Holtzapple expressed understanding of the plan provided.

## 2022-05-11 DIAGNOSIS — M62838 Other muscle spasm: Secondary | ICD-10-CM | POA: Diagnosis not present

## 2022-05-11 DIAGNOSIS — R109 Unspecified abdominal pain: Secondary | ICD-10-CM | POA: Diagnosis not present

## 2022-05-14 ENCOUNTER — Encounter: Payer: Self-pay | Admitting: *Deleted

## 2022-05-14 ENCOUNTER — Encounter: Payer: Self-pay | Admitting: Physician Assistant

## 2022-05-14 NOTE — Telephone Encounter (Signed)
Received notification that pt's Jennifer Vincent second level appeal is schedule for tomorrow 2/28 at 2:15 pm.

## 2022-05-16 DIAGNOSIS — F4322 Adjustment disorder with anxiety: Secondary | ICD-10-CM | POA: Diagnosis not present

## 2022-05-24 ENCOUNTER — Other Ambulatory Visit: Payer: Self-pay | Admitting: *Deleted

## 2022-05-24 DIAGNOSIS — M359 Systemic involvement of connective tissue, unspecified: Secondary | ICD-10-CM

## 2022-05-24 MED ORDER — HYDROXYCHLOROQUINE SULFATE 200 MG PO TABS: 200.0000 mg | ORAL_TABLET | Freq: Two times a day (BID) | ORAL | 0 refills | Status: DC

## 2022-05-24 NOTE — Telephone Encounter (Signed)
Next Visit: 07/04/2022  Last Visit: 01/30/2022  Labs: 2/62/2024 hemoglobin 15.2, Alkaline Phosphatase 24,   Eye exam: 09/20/2021   Current Dose per office note 01/30/2022: Plaquenil 200 mg 1 tablet by mouth twice daily.   [id number] disease   Last Fill: 01/30/2022  Okay to refill Plaquenil?

## 2022-05-25 DIAGNOSIS — K5900 Constipation, unspecified: Secondary | ICD-10-CM | POA: Diagnosis not present

## 2022-05-25 DIAGNOSIS — M62838 Other muscle spasm: Secondary | ICD-10-CM | POA: Diagnosis not present

## 2022-05-25 DIAGNOSIS — R109 Unspecified abdominal pain: Secondary | ICD-10-CM | POA: Diagnosis not present

## 2022-06-19 ENCOUNTER — Ambulatory Visit (INDEPENDENT_AMBULATORY_CARE_PROVIDER_SITE_OTHER): Payer: BC Managed Care – PPO | Admitting: Neurology

## 2022-06-19 DIAGNOSIS — G43711 Chronic migraine without aura, intractable, with status migrainosus: Secondary | ICD-10-CM | POA: Diagnosis not present

## 2022-06-19 MED ORDER — UBRELVY 100 MG PO TABS: 100.0000 mg | ORAL_TABLET | ORAL | 0 refills | Status: DC | PRN

## 2022-06-19 MED ORDER — KETOROLAC TROMETHAMINE 60 MG/2ML IM SOLN
60.0000 mg | Freq: Once | INTRAMUSCULAR | Status: AC
Start: 2022-06-19 — End: 2022-06-19
  Administered 2022-06-19: 60 mg via INTRAMUSCULAR

## 2022-06-19 MED ORDER — ONABOTULINUMTOXINA 200 UNITS IJ SOLR
155.0000 [IU] | Freq: Once | INTRAMUSCULAR | Status: AC
  Administered 2022-06-19: 155 [IU] via INTRAMUSCULAR

## 2022-06-19 NOTE — Progress Notes (Signed)
Consent Form Botulism Toxin Injection For Chronic Migraine   06/19/2023 stable  022/07/2022: Stable doing well on botox and quilipta. Samples botox, spent time writing qulipta/botox request fo rboth insurnce is giving us problems approving both but the combination has been spectacular for her   02/25/2022: Doing well 5 migraine days a month and the [place] and qulipta helps acutely. +a. Next would try Vyepti. May move botox out to every 10 weeks next time. SAMPLES TODAY bill by time  Reviewed orally with patient, additionally signature is on file:  Botulism toxin has been approved by the Federal drug administration for treatment of chronic migraine. Botulism toxin does not cure chronic migraine and it may not be effective in some patients.  The administration of botulism toxin is accomplished by injecting a small amount of toxin into the muscles of the neck and head. Dosage must be titrated for each individual. Any benefits resulting from botulism toxin tend to wear off after 3 months with a repeat injection required if benefit is to be maintained. Injections are usually done every 3-4 months with maximum effect peak achieved by about 2 or 3 weeks. Botulism toxin is expensive and you should be sure of what costs you will incur resulting from the injection.  The side effects of botulism toxin use for chronic migraine may include:   -Transient, and usually mild, facial weakness with facial injections  -Transient, and usually mild, head or neck weakness with head/neck injections  -Reduction or loss of forehead facial animation due to forehead muscle weakness  -Eyelid drooping  -Dry eye  -Pain at the site of injection or bruising at the site of injection  -Double vision  -Potential unknown long term risks   Contraindications: You should not have Botox if you are pregnant, nursing, allergic to albumin, have an infection, skin condition, or muscle weakness at the site of the injection, or  have myasthenia gravis, Lambert-Eaton syndrome, or ALS.  It is also possible that as with any injection, there may be an allergic reaction or no effect from the medication. Reduced effectiveness after repeated injections is sometimes seen and rarely infection at the injection site may occur. All care will be taken to prevent these side effects. If therapy is given over a long time, atrophy and wasting in the muscle injected may occur. Occasionally the patient's become refractory to treatment because they develop antibodies to the toxin. In this event, therapy needs to be modified.  I have read the above information and consent to the administration of botulism toxin.    BOTOX PROCEDURE NOTE FOR MIGRAINE HEADACHE  Contraindications and precautions discussed with patient(above). Aseptic procedure was observed and patient tolerated procedure. Procedure performed by Ihor AustinJessica McCue, NP-BC.   The condition has existed for more than 6 months, and pt does not have a diagnosis of ALS, Myasthenia Gravis or Lambert-Eaton Syndrome.  Risks and benefits of injections discussed and pt agrees to proceed with the procedure.  Written consent obtained  These injections are medically necessary. Pt  receives good benefits from these injections. These injections do not cause sedations or hallucinations which the oral therapies may cause.   Description of procedure:  The patient was placed in a sitting position. The standard protocol was used for Botox as follows, with 5 units of Botox injected at each site:  -Procerus muscle, midline injection  -Corrugator muscle, bilateral injection  -Frontalis muscle, bilateral injection, with 2 sites each side, medial injection was performed in the upper one  third of the frontalis muscle, in the region vertical from the medial inferior edge of the superior orbital rim. The lateral injection was again in the upper one third of the forehead vertically above the lateral limbus of  the cornea, 1.5 cm lateral to the medial injection site.  -Temporalis muscle injection, 4 sites, bilaterally. The first injection was 3 cm above the tragus of the ear, second injection site was 1.5 cm to 3 cm up from the first injection site in line with the tragus of the ear. The third injection site was 1.5-3 cm forward between the first 2 injection sites. The fourth injection site was 1.5 cm posterior to the second injection site. 5th site laterally in the temporalis  muscleat the level of the outer canthus.  -Occipitalis muscle injection, 3 sites, bilaterally. The first injection was done one half way between the occipital protuberance and the tip of the mastoid process behind the ear. The second injection site was done lateral and superior to the first, 1 fingerbreadth from the first injection. The third injection site was 1 fingerbreadth superiorly and medially from the first injection site.  -Cervical paraspinal muscle injection, 2 sites, bilaterally. The first injection site was 1 cm from the midline of the cervical spine, 3 cm inferior to the lower border of the occipital protuberance. The second injection site was 1.5 cm superiorly and laterally to the first injection site.  -Trapezius muscle injection was performed at 3 sites, bilaterally. The first injection site was in the upper trapezius muscle halfway between the inflection point of the neck, and the acromion. The second injection site was one half way between the acromion and the first injection site. The third injection was done between the first injection site and the inflection point of the neck.  -Masseters: 2 sites bilaterally, 5 units each side   Will return for repeat injection in 3 months.   A total of 200 units of Botox was prepared, 45 units wasted Botox not injected was wasted. The patient tolerated the procedure well, there were no complications of the above procedure.

## 2022-06-19 NOTE — Progress Notes (Signed)
Botox- 200 units x 1 vial Lot: [id number] Expiration: 08/2024 NDC: [id number]  Bacteriostatic 0.9% Sodium Chloride- 4 mL total Lot: [id number] Expiration: 11/25 NDC: [id number]  Dx: [id number] B/B Witnessed by S.Young RN  Toradol 60 mg given IM in RUOQ of R buttock per v.o. Dr Jaynee Eagles. Patient tolerated injection well. See MAR.

## 2022-06-19 NOTE — Patient Instructions (Signed)
Chronic migraines:  ACutely: Ubrelvy; Please take one tablet at the onset of your headache. If it does not improve the symptoms please take one additional tablet. Take first dose with ondansetron and alleve.

## 2022-06-20 DIAGNOSIS — F4322 Adjustment disorder with anxiety: Secondary | ICD-10-CM | POA: Diagnosis not present

## 2022-06-21 ENCOUNTER — Ambulatory Visit: Payer: BC Managed Care – PPO | Admitting: Physician Assistant

## 2022-06-23 ENCOUNTER — Other Ambulatory Visit: Payer: Self-pay | Admitting: Physician Assistant

## 2022-06-23 DIAGNOSIS — F411 Generalized anxiety disorder: Secondary | ICD-10-CM

## 2022-06-28 ENCOUNTER — Ambulatory Visit: Payer: BC Managed Care – PPO | Admitting: Physician Assistant

## 2022-07-01 ENCOUNTER — Encounter: Payer: Self-pay | Admitting: Physician Assistant

## 2022-07-01 NOTE — Telephone Encounter (Signed)
Please see pt msg as FYI 

## 2022-07-03 NOTE — Progress Notes (Deleted)
Office Visit Note  Patient: Jennifer Vincent             Date of Birth: 11/13/1990           MRN: [id number]             PCP: Bary Leriche, PA-C Referring: Allwardt, Crist Infante, PA-C Visit Date: 07/04/2022 Occupation: @  Subjective:  No chief complaint on file.   History of Present Illness: Jennifer Vincent is a 32 y.o. female ***     Activities of Daily Living:  Patient reports morning stiffness for *** {minute/hour:19697}.   Patient {ACTIONS;DENIES/REPORTS:21021675::"Denies"} nocturnal pain.  Difficulty dressing/grooming: {ACTIONS;DENIES/REPORTS:21021675::"Denies"} Difficulty climbing stairs: {ACTIONS;DENIES/REPORTS:21021675::"Denies"} Difficulty getting out of chair: {ACTIONS;DENIES/REPORTS:21021675::"Denies"} Difficulty using hands for taps, buttons, cutlery, and/or writing: {ACTIONS;DENIES/REPORTS:21021675::"Denies"}  No Rheumatology ROS completed.   PMFS History:  Patient Active Problem List   Diagnosis Date Noted   Unexplained night sweats 04/01/2022   Antiphospholipid antibody syndrome 10/02/2020   Chronic migraine without aura, with intractable migraine, so stated, with status migrainosus 06/01/2019   Generalized anxiety disorder 01/11/2018   Panic disorder 10/04/2016   Anxiety and depression 10/04/2016    Past Medical History:  Diagnosis Date   Allergic rhinitis    Anxiety    Autoimmune disorder (HCC)    nonspecified   COVID 04/05/2020   Frequent headaches    IBS (irritable bowel syndrome)    Migraine    PCOS (polycystic ovarian syndrome)     Family History  Problem Relation Age of Onset   Depression Mother    Eating disorder Mother    Arthritis Father    Crohn's disease Father    Ulcerative colitis Father    Arthritis Brother    Alcohol abuse Brother    Mental illness Brother    Depression Brother    Anxiety disorder Brother    Crohn's disease Brother    Migraines Maternal Uncle    Breast cancer Maternal Grandmother    Mental illness  Maternal Grandfather    Anxiety disorder Maternal Grandfather    OCD Maternal Grandfather    Heart disease Paternal Grandfather    Diabetes Paternal Grandfather    Heart attack Paternal Grandfather    Healthy Daughter    Healthy Son    Past Surgical History:  Procedure Laterality Date   TONSILLECTOMY     Social History   Social History Narrative   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   Physical Exam   Musculoskeletal Exam: ***  CDAI Exam: CDAI Score: -- Patient Global: --; Provider Global: -- Swollen: --; Tender: -- Joint Exam 07/04/2022   No joint exam has been documented for this visit   There is currently no information documented on the homunculus. Go to the Rheumatology activity and complete the homunculus joint exam.  Investigation: No additional findings.  Imaging: No results found.  Recent  Labs: Lab Results  Component Value Date   WBC 5.5 04/23/2022   HGB 15.2 (H) 04/23/2022   PLT 278 04/23/2022   NA 139 04/23/2022   K 3.8 04/23/2022   CL 105 04/23/2022   CO2 28 04/23/2022   GLUCOSE 88 04/23/2022   BUN 9 04/23/2022   CREATININE 0.83 04/23/2022   BILITOT 0.5 04/23/2022   ALKPHOS 24 (L) 04/23/2022   AST 19 04/23/2022   ALT 22 04/23/2022   PROT 7.1 04/23/2022   ALBUMIN 4.4 04/23/2022   CALCIUM 9.4 04/23/2022   GFRAA 119 07/29/2019   QFTBGOLDPLUS NEGATIVE  07/29/2019    Speciality Comments: PLQ eye exam: 09/20/2021 WNL. [facility]. Follow up in 1 year.  Procedures:  No procedures performed Allergies: Depakote [divalproex sodium] and Lactose intolerance (gi)   Assessment / Plan:     Visit Diagnoses: Autoimmune disease  High risk medication use  Anticardiolipin antibody positive  Pain in both hands  Pain of both elbows  Chondromalacia of both patellae  Vitamin D deficiency  Generalized anxiety disorder  History of migraine  Panic disorder  History of COVID-19  History of shingles  History of IBS  Family history of Crohn's disease  Family history of ulcerative colitis  Orders: No orders of the defined types were placed in this encounter.  No orders of the defined types were placed in this encounter.   Face-to-face time spent with patient was *** minutes. Greater than 50% of time was spent in counseling and coordination of care.  Follow-Up Instructions: No follow-ups on file.   Gearldine Bienenstock, PA-C  Note - This record has been created using Dragon software.  Chart creation errors have been sought, but may not always  have been located. Such creation errors do not reflect on  the standard of medical care.

## 2022-07-04 ENCOUNTER — Ambulatory Visit: Payer: BC Managed Care – PPO | Admitting: Physician Assistant

## 2022-07-04 DIAGNOSIS — Z8379 Family history of other diseases of the digestive system: Secondary | ICD-10-CM

## 2022-07-04 DIAGNOSIS — F411 Generalized anxiety disorder: Secondary | ICD-10-CM

## 2022-07-04 DIAGNOSIS — Z8669 Personal history of other diseases of the nervous system and sense organs: Secondary | ICD-10-CM

## 2022-07-04 DIAGNOSIS — Z79899 Other long term (current) drug therapy: Secondary | ICD-10-CM

## 2022-07-04 DIAGNOSIS — M25521 Pain in right elbow: Secondary | ICD-10-CM

## 2022-07-04 DIAGNOSIS — M79641 Pain in right hand: Secondary | ICD-10-CM

## 2022-07-04 DIAGNOSIS — M359 Systemic involvement of connective tissue, unspecified: Secondary | ICD-10-CM

## 2022-07-04 DIAGNOSIS — M2242 Chondromalacia patellae, left knee: Secondary | ICD-10-CM

## 2022-07-04 DIAGNOSIS — E559 Vitamin D deficiency, unspecified: Secondary | ICD-10-CM

## 2022-07-04 DIAGNOSIS — F4322 Adjustment disorder with anxiety: Secondary | ICD-10-CM | POA: Diagnosis not present

## 2022-07-04 DIAGNOSIS — Z8616 Personal history of COVID-19: Secondary | ICD-10-CM

## 2022-07-04 DIAGNOSIS — F410 Panic disorder [episodic paroxysmal anxiety] without agoraphobia: Secondary | ICD-10-CM

## 2022-07-04 DIAGNOSIS — R760 Raised antibody titer: Secondary | ICD-10-CM

## 2022-07-04 DIAGNOSIS — Z8719 Personal history of other diseases of the digestive system: Secondary | ICD-10-CM

## 2022-07-04 DIAGNOSIS — Z8619 Personal history of other infectious and parasitic diseases: Secondary | ICD-10-CM

## 2022-07-05 ENCOUNTER — Other Ambulatory Visit: Payer: Self-pay | Admitting: Physician Assistant

## 2022-07-05 MED ORDER — SCOPOLAMINE 1 MG/3DAYS TD PT72: 1.0000 | MEDICATED_PATCH | TRANSDERMAL | 0 refills | Status: DC

## 2022-07-08 NOTE — Progress Notes (Deleted)
Office Visit Note  Patient: Jennifer Vincent             Date of Birth: 08/27/1990           MRN: [id number]             PCP: Bary Leriche, PA-C Referring: Allwardt, Crist Infante, PA-C Visit Date: 07/18/2022 Occupation: @  Subjective:  No chief complaint on file.   History of Present Illness: Jennifer Vincent is a 32 y.o. female ***     Activities of Daily Living:  Patient reports morning stiffness for *** {minute/hour:19697}.   Patient {ACTIONS;DENIES/REPORTS:21021675::"Denies"} nocturnal pain.  Difficulty dressing/grooming: {ACTIONS;DENIES/REPORTS:21021675::"Denies"} Difficulty climbing stairs: {ACTIONS;DENIES/REPORTS:21021675::"Denies"} Difficulty getting out of chair: {ACTIONS;DENIES/REPORTS:21021675::"Denies"} Difficulty using hands for taps, buttons, cutlery, and/or writing: {ACTIONS;DENIES/REPORTS:21021675::"Denies"}  No Rheumatology ROS completed.   PMFS History:  Patient Active Problem List   Diagnosis Date Noted   Unexplained night sweats 04/01/2022   Antiphospholipid antibody syndrome 10/02/2020   Chronic migraine without aura, with intractable migraine, so stated, with status migrainosus 06/01/2019   Generalized anxiety disorder 01/11/2018   Panic disorder 10/04/2016   Anxiety and depression 10/04/2016    Past Medical History:  Diagnosis Date   Allergic rhinitis    Anxiety    Autoimmune disorder (HCC)    nonspecified   COVID 04/05/2020   Frequent headaches    IBS (irritable bowel syndrome)    Migraine    PCOS (polycystic ovarian syndrome)     Family History  Problem Relation Age of Onset   Depression Mother    Eating disorder Mother    Arthritis Father    Crohn's disease Father    Ulcerative colitis Father    Arthritis Brother    Alcohol abuse Brother    Mental illness Brother    Depression Brother    Anxiety disorder Brother    Crohn's disease Brother    Migraines Maternal Uncle    Breast cancer Maternal Grandmother    Mental illness  Maternal Grandfather    Anxiety disorder Maternal Grandfather    OCD Maternal Grandfather    Heart disease Paternal Grandfather    Diabetes Paternal Grandfather    Heart attack Paternal Grandfather    Healthy Daughter    Healthy Son    Past Surgical History:  Procedure Laterality Date   TONSILLECTOMY     Social History   Social History Narrative   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   Physical Exam   Musculoskeletal Exam: ***  CDAI Exam: CDAI Score: -- Patient Global: --; Provider Global: -- Swollen: --; Tender: -- Joint Exam 07/18/2022   No joint exam has been documented for this visit   There is currently no information documented on the homunculus. Go to the Rheumatology activity and complete the homunculus joint exam.  Investigation: No additional findings.  Imaging: No results found.  Recent  Labs: Lab Results  Component Value Date   WBC 5.5 04/23/2022   HGB 15.2 (H) 04/23/2022   PLT 278 04/23/2022   NA 139 04/23/2022   K 3.8 04/23/2022   CL 105 04/23/2022   CO2 28 04/23/2022   GLUCOSE 88 04/23/2022   BUN 9 04/23/2022   CREATININE 0.83 04/23/2022   BILITOT 0.5 04/23/2022   ALKPHOS 24 (L) 04/23/2022   AST 19 04/23/2022   ALT 22 04/23/2022   PROT 7.1 04/23/2022   ALBUMIN 4.4 04/23/2022   CALCIUM 9.4 04/23/2022   GFRAA 119 07/29/2019   QFTBGOLDPLUS NEGATIVE  07/29/2019    Speciality Comments: PLQ eye exam: 09/20/2021 WNL. [facility]. Follow up in 1 year.  Procedures:  No procedures performed Allergies: Depakote [divalproex sodium] and Lactose intolerance (gi)   Assessment / Plan:     Visit Diagnoses: No diagnosis found.  Orders: No orders of the defined types were placed in this encounter.  No orders of the defined types were placed in this encounter.   Face-to-face time spent with patient was *** minutes. Greater than 50% of time was spent in counseling and coordination of care.  Follow-Up Instructions: No follow-ups on file.   Ellen Henri, CMA  Note - This record has been created using Animal nutritionist.  Chart creation errors have been sought, but may not always  have been located. Such creation errors do not reflect on  the standard of medical care.

## 2022-07-16 ENCOUNTER — Ambulatory Visit: Payer: BC Managed Care – PPO | Admitting: Physician Assistant

## 2022-07-17 ENCOUNTER — Ambulatory Visit (INDEPENDENT_AMBULATORY_CARE_PROVIDER_SITE_OTHER): Payer: BC Managed Care – PPO | Admitting: Physician Assistant

## 2022-07-17 ENCOUNTER — Encounter: Payer: Self-pay | Admitting: Physician Assistant

## 2022-07-17 VITALS — BP 134/84 | HR 100 | Temp 98.0°F | Ht 68.0 in | Wt 211.2 lb

## 2022-07-17 DIAGNOSIS — K921 Melena: Secondary | ICD-10-CM | POA: Diagnosis not present

## 2022-07-17 DIAGNOSIS — Z8379 Family history of other diseases of the digestive system: Secondary | ICD-10-CM | POA: Diagnosis not present

## 2022-07-17 DIAGNOSIS — F419 Anxiety disorder, unspecified: Secondary | ICD-10-CM

## 2022-07-17 DIAGNOSIS — E6609 Other obesity due to excess calories: Secondary | ICD-10-CM | POA: Diagnosis not present

## 2022-07-17 DIAGNOSIS — R61 Generalized hyperhidrosis: Secondary | ICD-10-CM

## 2022-07-17 DIAGNOSIS — Z6832 Body mass index (BMI) 32.0-32.9, adult: Secondary | ICD-10-CM

## 2022-07-17 DIAGNOSIS — Z8669 Personal history of other diseases of the nervous system and sense organs: Secondary | ICD-10-CM | POA: Insufficient documentation

## 2022-07-17 DIAGNOSIS — F32A Depression, unspecified: Secondary | ICD-10-CM

## 2022-07-17 LAB — CBC WITH DIFFERENTIAL/PLATELET
Basophils Absolute: 0.1 10*3/uL (ref 0.0–0.1)
Basophils Relative: 1.2 % (ref 0.0–3.0)
Eosinophils Absolute: 0.1 10*3/uL (ref 0.0–0.7)
Eosinophils Relative: 0.8 % (ref 0.0–5.0)
HCT: 43.7 % (ref 36.0–46.0)
Hemoglobin: 14.9 g/dL (ref 12.0–15.0)
Lymphocytes Relative: 28.7 % (ref 12.0–46.0)
Lymphs Abs: 1.9 10*3/uL (ref 0.7–4.0)
MCHC: 34 g/dL (ref 30.0–36.0)
MCV: 93 fl (ref 78.0–100.0)
Monocytes Absolute: 0.3 10*3/uL (ref 0.1–1.0)
Monocytes Relative: 4.9 % (ref 3.0–12.0)
Neutro Abs: 4.3 10*3/uL (ref 1.4–7.7)
Neutrophils Relative %: 64.4 % (ref 43.0–77.0)
Platelets: 247 10*3/uL (ref 150.0–400.0)
RBC: 4.7 Mil/uL (ref 3.87–5.11)
RDW: 12.5 % (ref 11.5–15.5)
WBC: 6.6 10*3/uL (ref 4.0–10.5)

## 2022-07-17 MED ORDER — SEMAGLUTIDE-WEIGHT MANAGEMENT 0.25 MG/0.5ML ~~LOC~~ SOAJ
0.2500 mg | SUBCUTANEOUS | 0 refills | Status: DC
Start: 2022-07-17 — End: 2022-08-09

## 2022-07-17 NOTE — Assessment & Plan Note (Signed)
Father - UC Brother - Crohns Pt would like colonoscopy to have her own colon screened  Referral sent.

## 2022-07-17 NOTE — Patient Instructions (Addendum)
Consider switching from Zoloft to Wellbutrin or Trintellix to help with night sweats  Please call [facility] to schedule your colonoscopy. [phone]  Try to start back on Wegovy to help with weight concerns. Keep working on lifestyle  CBC recheck today

## 2022-07-17 NOTE — Assessment & Plan Note (Signed)
Working with counselor Depression stable on Zoloft 100 mg daily Buspar 30 mg BID helps with anxiety stability  Hydroxyzine 25 mg prn insomnia / panic  However, night sweats likely from Zoloft Consider switching from Zoloft to Wellbutrin or Trintellix to help with night sweats

## 2022-07-17 NOTE — Assessment & Plan Note (Addendum)
Actively engaged in healthy lifestyle habits, good dietary and exercise measures already in place. She tolerated [place] well last year x 1 month before it ran out of stock. Will try to get this sent in for her again.  Wegovy 0.25 mg once weekly x 4 weeks, then increase monthly as tolerated. Pt aware of risks vs benefits and possible adverse reactions. Cautioned about Wegovy and GI issues, esp with her hx of IBS-C.

## 2022-07-17 NOTE — Progress Notes (Deleted)
Office Visit Note  Patient: Jennifer Vincent             Date of Birth: May 20, 1990           MRN: [id number]             PCP: Bary Leriche, PA-C Referring: Allwardt, Crist Infante, PA-C Visit Date: 07/29/2022 Occupation: @GUAROCC @  Subjective:  No chief complaint on file.   History of Present Illness: Jennifer Vincent is a 32 y.o. female ***     Activities of Daily Living:  Patient reports morning stiffness for *** {minute/hour:19697}.   Patient {ACTIONS;DENIES/REPORTS:21021675::"Denies"} nocturnal pain.  Difficulty dressing/grooming: {ACTIONS;DENIES/REPORTS:21021675::"Denies"} Difficulty climbing stairs: {ACTIONS;DENIES/REPORTS:21021675::"Denies"} Difficulty getting out of chair: {ACTIONS;DENIES/REPORTS:21021675::"Denies"} Difficulty using hands for taps, buttons, cutlery, and/or writing: {ACTIONS;DENIES/REPORTS:21021675::"Denies"}  No Rheumatology ROS completed.   PMFS History:  Patient Active Problem List   Diagnosis Date Noted   History of migraine 07/17/2022   Unexplained night sweats 04/01/2022   Pain in pelvis 03/06/2022   Antiphospholipid antibody syndrome (HCC) 10/02/2020   Chronic migraine without aura, with intractable migraine, so stated, with status migrainosus 06/01/2019   Generalized anxiety disorder 01/11/2018   Panic disorder 10/04/2016   Anxiety and depression 10/04/2016    Past Medical History:  Diagnosis Date   Allergic rhinitis    Anxiety    Autoimmune disorder (HCC)    nonspecified   COVID 04/05/2020   Frequent headaches    IBS (irritable bowel syndrome)    Migraine    PCOS (polycystic ovarian syndrome)     Family History  Problem Relation Age of Onset   Depression Mother    Eating disorder Mother    Arthritis Father    Crohn's disease Father    Ulcerative colitis Father    Arthritis Brother    Alcohol abuse Brother    Mental illness Brother    Depression Brother    Anxiety disorder Brother    Crohn's disease Brother    Migraines Maternal  Uncle    Breast cancer Maternal Grandmother    Mental illness Maternal Grandfather    Anxiety disorder Maternal Grandfather    OCD Maternal Grandfather    Heart disease Paternal Grandfather    Diabetes Paternal Grandfather    Heart attack Paternal Grandfather    Healthy Daughter    Healthy Son    Past Surgical History:  Procedure Laterality Date   TONSILLECTOMY     Social History   Social History Narrative   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   Physical Exam   Musculoskeletal Exam: ***  CDAI Exam: CDAI Score: -- Patient Global: --; Provider Global: -- Swollen: --; Tender: -- Joint Exam 07/29/2022   No joint exam has been documented for this visit   There is currently no information documented on the homunculus. Go to the Rheumatology activity and complete the homunculus  joint exam.  Investigation: No additional findings.  Imaging: No results found.  Recent Labs: Lab Results  Component Value Date   WBC 5.5 04/23/2022   HGB 15.2 (H) 04/23/2022   PLT 278 04/23/2022   NA 139 04/23/2022   K 3.8 04/23/2022   CL 105 04/23/2022   CO2 28 04/23/2022   GLUCOSE 88 04/23/2022   BUN 9 04/23/2022   CREATININE 0.83 04/23/2022   BILITOT 0.5 04/23/2022   ALKPHOS 24 (L) 04/23/2022   AST 19 04/23/2022   ALT 22 04/23/2022   PROT 7.1 04/23/2022   ALBUMIN 4.4 04/23/2022    CALCIUM 9.4 04/23/2022   GFRAA 119 07/29/2019   QFTBGOLDPLUS NEGATIVE 07/29/2019    Speciality Comments: PLQ eye exam: 09/20/2021 WNL. [facility]. Follow up in 1 year.  Procedures:  No procedures performed Allergies: Depakote [divalproex sodium], Lactose, and Lactose intolerance (gi)   Assessment / Plan:     Visit Diagnoses: Autoimmune disease (HCC)  High risk medication use  Anticardiolipin antibody positive  Pain in both hands  Pain of both elbows  Chondromalacia of both patellae  Vitamin D deficiency  Generalized anxiety disorder  History of migraine  Panic disorder  History of COVID-19  History of shingles  History of IBS  Family history of Crohn's disease  Family history of ulcerative colitis  Orders: No orders of the defined types were placed in this encounter.  No orders of the defined types were placed in this encounter.   Face-to-face time spent with patient was *** minutes. Greater than 50% of time was spent in counseling and coordination of care.  Follow-Up Instructions: No follow-ups on file.   Gearldine Bienenstock, PA-C  Note - This record has been created using Dragon software.  Chart creation errors have been sought, but may not always  have been located. Such creation errors do not reflect on  the standard of medical care.

## 2022-07-17 NOTE — Progress Notes (Signed)
Subjective:    Patient ID: Jennifer Vincent, female    DOB: 07/03/1990, 32 y.o.   MRN: [id number]  Chief Complaint  Patient presents with   Weight Loss    Pt in office to discuss weight loss and still having night sweats; blood in stool since Monday, but not a lot, happened once to her previously but wanted to mention since coming in the office; Pt not having pain but having pressure the past few days.    HPI Patient is in today for follow-up / concerns.   Night sweats - better - not drenching through sheets anymore; sometimes still coming in waves.   Anxiety is much better since last visit. Not needing Xanax or hydroxyzine as much as previously. Stable on Zoloft 100 mg for the last 15 years or so.   Bennie Pierini working well for her migraines, very stable per patient. Thinks it has contributed to constipation again though. At first it seemed to suppress her appetite, but now back full-force and staying hungry. Worried about constant food noise and weight gain again.   Two days ago blood in stool and with wiping. Same thing yesterday. Hasn't had BM today. Bright red blood. Some pelvic pressure again, as before, when she had constipation.  Trying to push fluids and working on pelvic exercises.  -GI previously in [place] - told she had slow transit - was on Linzess previously for IBS-C & said this worked very well  -Dad has UC, brother with Crohn's; she has never had colonoscopy, she's wondering about possibility of IBD with herself   Past Medical History:  Diagnosis Date   Allergic rhinitis    Anxiety    Autoimmune disorder (HCC)    nonspecified   COVID 04/05/2020   Frequent headaches    IBS (irritable bowel syndrome)    Migraine    PCOS (polycystic ovarian syndrome)     Past Surgical History:  Procedure Laterality Date   TONSILLECTOMY      Family History  Problem Relation Age of Onset   Depression Mother    Eating disorder Mother    Arthritis Father    Crohn's disease Father     Ulcerative colitis Father    Arthritis Brother    Alcohol abuse Brother    Mental illness Brother    Depression Brother    Anxiety disorder Brother    Crohn's disease Brother    Migraines Maternal Uncle    Breast cancer Maternal Grandmother    Mental illness Maternal Grandfather    Anxiety disorder Maternal Grandfather    OCD Maternal Grandfather    Heart disease Paternal Grandfather    Diabetes Paternal Grandfather    Heart attack Paternal Grandfather    Healthy Daughter    Healthy Son     Social History   Tobacco Use   Smoking status: Never    Passive exposure: Never   Smokeless tobacco: Never  Vaping Use   Vaping Use: Never used  Substance Use Topics   Alcohol use: Not Currently   Drug use: Never     Allergies  Allergen Reactions   Depakote [Divalproex Sodium]     Depression/suicidal thoughts   Lactose Diarrhea    GI upset   Lactose Intolerance (Gi)     GI upset    Review of Systems NEGATIVE UNLESS OTHERWISE INDICATED IN HPI      Objective:     BP 134/84 (BP Location: Right Arm)   Pulse 100   Temp 98 F (36.7  C) (Temporal)   Ht 5\' 8"  (1.727 m)   Wt 211 lb 3.2 oz (95.8 kg)   SpO2 100%   BMI 32.11 kg/m   Wt Readings from Last 3 Encounters:  07/17/22 211 lb 3.2 oz (95.8 kg)  04/23/22 199 lb 3.2 oz (90.4 kg)  04/01/22 201 lb (91.2 kg)    BP Readings from Last 3 Encounters:  07/17/22 134/84  04/23/22 (!) 131/90  04/01/22 110/82     Physical Exam Vitals and nursing note reviewed.  Constitutional:      Appearance: Normal appearance. She is obese.  Eyes:     Extraocular Movements: Extraocular movements intact.     Conjunctiva/sclera: Conjunctivae normal.     Pupils: Pupils are equal, round, and reactive to light.  Cardiovascular:     Rate and Rhythm: Normal rate and regular rhythm.  Pulmonary:     Effort: Pulmonary effort is normal.     Breath sounds: Normal breath sounds.  Genitourinary:    Comments: Declined exam  Skin:     General: Skin is warm.     Findings: No rash.  Neurological:     General: No focal deficit present.     Mental Status: She is alert.  Psychiatric:        Mood and Affect: Mood normal.        Behavior: Behavior normal.        Assessment & Plan:  Anxiety and depression Assessment & Plan: Working with counselor Depression stable on Zoloft 100 mg daily Buspar 30 mg BID helps with anxiety stability  Hydroxyzine 25 mg prn insomnia / panic  However, night sweats likely from Zoloft Consider switching from Zoloft to Wellbutrin or Trintellix to help with night sweats    Unexplained night sweats Assessment & Plan: Likely secondary to Zoloft; suggested switching medications; pt will let me know.  Other workup has been negative.   Class 1 obesity due to excess calories without serious comorbidity with body mass index (BMI) of 32.0 to 32.9 in adult Assessment & Plan: Actively engaged in healthy lifestyle habits, good dietary and exercise measures already in place. She tolerated [place] well last year x 1 month before it ran out of stock. Will try to get this sent in for her again.  Wegovy 0.25 mg once weekly x 4 weeks, then increase monthly as tolerated. Pt aware of risks vs benefits and possible adverse reactions. Cautioned about Wegovy and GI issues, esp with her hx of IBS-C.   Orders: -     Semaglutide-Weight Management; Inject 0.25 mg into the skin once a week.  Dispense: 2 mL; Refill: 0  Family history of inflammatory bowel disease Assessment & Plan: Father - UC Brother - Crohns Pt would like colonoscopy to have her own colon screened  Referral sent.   Orders: -     Ambulatory referral to Gastroenterology  Blood in stool -     CBC with Differential/Platelet -     Ambulatory referral to Gastroenterology  -likely IBS-C related. She is doing Miralax daily. Increasing fluids and fiber. Consider going back on Linzess.     Return if symptoms worsen or fail to  improve.  This note was prepared with assistance of Conservation officer, historic buildings. Occasional wrong-word or sound-a-like substitutions may have occurred due to the inherent limitations of voice recognition software.  Time Spent: 38 minutes of total time was spent on the date of the encounter performing the following actions: chart review prior to seeing the patient, obtaining  history, performing a medically necessary exam, counseling on the treatment plan, placing orders, and documenting in our EHR.       Skyeler Scalese M Maleigha Colvard, PA-C

## 2022-07-17 NOTE — Telephone Encounter (Signed)
Please see patient message and start her prior auth

## 2022-07-17 NOTE — Assessment & Plan Note (Signed)
Likely secondary to Zoloft; suggested switching medications; pt will let me know.  Other workup has been negative.

## 2022-07-18 ENCOUNTER — Telehealth: Payer: Self-pay

## 2022-07-18 ENCOUNTER — Ambulatory Visit: Payer: BC Managed Care – PPO | Admitting: Physician Assistant

## 2022-07-18 DIAGNOSIS — M79641 Pain in right hand: Secondary | ICD-10-CM

## 2022-07-18 DIAGNOSIS — E559 Vitamin D deficiency, unspecified: Secondary | ICD-10-CM

## 2022-07-18 DIAGNOSIS — M2241 Chondromalacia patellae, right knee: Secondary | ICD-10-CM

## 2022-07-18 DIAGNOSIS — Z8719 Personal history of other diseases of the digestive system: Secondary | ICD-10-CM

## 2022-07-18 DIAGNOSIS — R760 Raised antibody titer: Secondary | ICD-10-CM

## 2022-07-18 DIAGNOSIS — Z8379 Family history of other diseases of the digestive system: Secondary | ICD-10-CM

## 2022-07-18 DIAGNOSIS — M25521 Pain in right elbow: Secondary | ICD-10-CM

## 2022-07-18 DIAGNOSIS — F410 Panic disorder [episodic paroxysmal anxiety] without agoraphobia: Secondary | ICD-10-CM

## 2022-07-18 DIAGNOSIS — F411 Generalized anxiety disorder: Secondary | ICD-10-CM

## 2022-07-18 DIAGNOSIS — Z8616 Personal history of COVID-19: Secondary | ICD-10-CM

## 2022-07-18 DIAGNOSIS — M359 Systemic involvement of connective tissue, unspecified: Secondary | ICD-10-CM

## 2022-07-18 DIAGNOSIS — Z8619 Personal history of other infectious and parasitic diseases: Secondary | ICD-10-CM

## 2022-07-18 DIAGNOSIS — Z8669 Personal history of other diseases of the nervous system and sense organs: Secondary | ICD-10-CM

## 2022-07-18 DIAGNOSIS — Z79899 Other long term (current) drug therapy: Secondary | ICD-10-CM

## 2022-07-18 NOTE — Telephone Encounter (Signed)
[name] (KeyAbigail Miyamoto) Rx #: [id number] 0.25MG /0.5ML auto-injectors Medical OV notes faxed from 07/17/22 visit Awaiting Determination

## 2022-07-19 ENCOUNTER — Ambulatory Visit: Payer: BC Managed Care – PPO | Admitting: Physician Assistant

## 2022-07-19 ENCOUNTER — Telehealth: Payer: Self-pay | Admitting: Physician Assistant

## 2022-07-19 NOTE — Telephone Encounter (Signed)
Returned call to patient. Pt states that she saw blood for 2 days and is not currently having active bleeding. She stated that PCP was hoping to get her in for colonoscopy prior to her leaving for [place]. Pt stated that PCP wanted her to have a colonoscopy due to ongoing GI symptoms, and her family hx (father and brother have Crohn's colitis). I advised patient that she will need an office visit to further discuss with provider. Appt has been rescheduled to 08/13/22 at 1:30 pm with Quentin Mulling, PA-C. Pt verbalized understanding and had no concerns at the end of the call.

## 2022-07-19 NOTE — Telephone Encounter (Signed)
Patient called to schedule colonoscopy from referral. She is also have bloody stools so she is scheduled for a OV on 07/11. She states that her pcp wanted to get her colonoscopy as soon as possible so it is not lingering concerns. She also states that she will be out of town for a while starting in July and does not know when she could come back and have the colonoscopy. She is wondering if it is possible to schedule the colonoscopy directly. Please advise, thank you.

## 2022-07-22 NOTE — Telephone Encounter (Signed)
Message from plan: Denied. This health benefit plan does not cover the following services, supplies, drugs or charges: Any treatment or regimen, medical or surgical, for the purpose of reducing or controlling the weight of the member, or for the treatment of obesity, except for surgical treatment of morbid obesity, or as specifically covered by this health benefit plan.  Please advise patient next steps to help

## 2022-07-23 ENCOUNTER — Other Ambulatory Visit: Payer: Self-pay | Admitting: Physician Assistant

## 2022-07-24 ENCOUNTER — Encounter: Payer: Self-pay | Admitting: Neurology

## 2022-07-24 NOTE — Telephone Encounter (Signed)
Message from plan: Denied. This health benefit plan does not cover the following services, supplies, drugs or charges: Any treatment or regimen, medical or surgical, for the purpose of reducing or controlling the weight of the member, or for the treatment of obesity, except for surgical treatment of morbid obesity, or as specifically covered by this health benefit plan.

## 2022-07-24 NOTE — Telephone Encounter (Signed)
Ok for Toradol 60 mg IM injection x 1 per Dr Lucia Gaskins

## 2022-07-24 NOTE — Telephone Encounter (Signed)
Please see pt msg below and advise:

## 2022-07-24 NOTE — Telephone Encounter (Signed)
Last OV: 07/17/22  Next OV: none scheduled  Last Filled: 02/11/22  Quantity: 20 w/ 2 refills

## 2022-07-29 ENCOUNTER — Ambulatory Visit: Payer: BC Managed Care – PPO | Admitting: Physician Assistant

## 2022-07-29 DIAGNOSIS — M2241 Chondromalacia patellae, right knee: Secondary | ICD-10-CM

## 2022-07-29 DIAGNOSIS — Z8379 Family history of other diseases of the digestive system: Secondary | ICD-10-CM

## 2022-07-29 DIAGNOSIS — F410 Panic disorder [episodic paroxysmal anxiety] without agoraphobia: Secondary | ICD-10-CM

## 2022-07-29 DIAGNOSIS — Z79899 Other long term (current) drug therapy: Secondary | ICD-10-CM

## 2022-07-29 DIAGNOSIS — E559 Vitamin D deficiency, unspecified: Secondary | ICD-10-CM

## 2022-07-29 DIAGNOSIS — Z8616 Personal history of COVID-19: Secondary | ICD-10-CM

## 2022-07-29 DIAGNOSIS — Z8619 Personal history of other infectious and parasitic diseases: Secondary | ICD-10-CM

## 2022-07-29 DIAGNOSIS — M79642 Pain in left hand: Secondary | ICD-10-CM

## 2022-07-29 DIAGNOSIS — M359 Systemic involvement of connective tissue, unspecified: Secondary | ICD-10-CM

## 2022-07-29 DIAGNOSIS — Z8669 Personal history of other diseases of the nervous system and sense organs: Secondary | ICD-10-CM

## 2022-07-29 DIAGNOSIS — M25521 Pain in right elbow: Secondary | ICD-10-CM

## 2022-07-29 DIAGNOSIS — R760 Raised antibody titer: Secondary | ICD-10-CM

## 2022-07-29 DIAGNOSIS — Z8719 Personal history of other diseases of the digestive system: Secondary | ICD-10-CM

## 2022-07-29 DIAGNOSIS — F411 Generalized anxiety disorder: Secondary | ICD-10-CM

## 2022-07-29 NOTE — Progress Notes (Signed)
Office Visit Note  Patient: Jennifer Vincent             Date of Birth: 02/26/1991           MRN: [id number]             PCP: Bary Leriche, PA-C Referring: Allwardt, Crist Infante, PA-C Visit Date: 08/06/2022 Occupation: @GUAROCC @  Subjective:  Dry eyes  History of Present Illness: Jennifer Vincent is a 32 y.o. female with history of autoimmune disease. Patient is taking plaquenil 200 mg 1 tablet by mouth twice daily.  She is tolerating Plaquenil without any side effects and has not missed any doses recently.  She denies any signs or symptoms of autoimmune disease flare recently.  She states that overall her symptoms have been stable taking Plaquenil as prescribed.  She denies any increased joint pain or joint swelling at this time.  She denies any sores in her mouth or nose.  She has had some increased eye dryness and has tried using Systane in the past with minimal improvement.  She denies any increased mouth dryness.  She denies any recent rashes and has been trying to avoid direct sun exposure.  She denies any increased hair loss.  She denies any symptoms of Raynaud's phenomenon.  She denies any swollen lymph nodes.  Her energy level has been stable.  She has been practicing yoga most mornings for exercise.    Activities of Daily Living:  Patient reports morning stiffness for 0 minutes.   Patient Reports nocturnal pain.  Difficulty dressing/grooming: Denies Difficulty climbing stairs: Denies Difficulty getting out of chair: Denies Difficulty using hands for taps, buttons, cutlery, and/or writing: Denies  Review of Systems  Constitutional:  Negative for fatigue.  HENT:  Negative for mouth sores and mouth dryness.   Eyes:  Positive for dryness.  Respiratory:  Negative for shortness of breath.   Cardiovascular:  Negative for chest pain and palpitations.  Gastrointestinal:  Positive for constipation. Negative for blood in stool and diarrhea.  Endocrine: Negative for increased urination.   Genitourinary:  Negative for involuntary urination.  Musculoskeletal:  Negative for joint pain, gait problem, joint pain, joint swelling, myalgias, muscle weakness, morning stiffness, muscle tenderness and myalgias.  Skin:  Positive for sensitivity to sunlight. Negative for color change, rash and hair loss.  Allergic/Immunologic: Positive for susceptible to infections.  Neurological:  Negative for dizziness and headaches.  Hematological:  Negative for swollen glands.  Psychiatric/Behavioral:  Negative for depressed mood and sleep disturbance. The patient is not nervous/anxious.     PMFS History:  Patient Active Problem List   Diagnosis Date Noted   History of migraine 07/17/2022   Class 1 obesity due to excess calories without serious comorbidity with body mass index (BMI) of 32.0 to 32.9 in adult 07/17/2022   Family history of inflammatory bowel disease 07/17/2022   Unexplained night sweats 04/01/2022   Pain in pelvis 03/06/2022   Antiphospholipid antibody syndrome (HCC) 10/02/2020   Chronic migraine without aura, with intractable migraine, so stated, with status migrainosus 06/01/2019   Generalized anxiety disorder 01/11/2018   Panic disorder 10/04/2016   Anxiety and depression 10/04/2016    Past Medical History:  Diagnosis Date   Allergic rhinitis    Anxiety    Autoimmune disorder (HCC)    nonspecified   COVID 04/05/2020   Frequent headaches    IBS (irritable bowel syndrome)    Migraine    PCOS (polycystic ovarian syndrome)     Family History  Problem Relation Age of Onset   Depression Mother    Eating disorder Mother    Arthritis Father    Crohn's disease Father    Ulcerative colitis Father    Arthritis Brother    Alcohol abuse Brother    Mental illness Brother    Depression Brother    Anxiety disorder Brother    Crohn's disease Brother    Migraines Maternal Uncle    Breast cancer Maternal Grandmother    Mental illness Maternal Grandfather    Anxiety disorder  Maternal Grandfather    OCD Maternal Grandfather    Heart disease Paternal Grandfather    Diabetes Paternal Grandfather    Heart attack Paternal Grandfather    Healthy Daughter    Healthy Son    Past Surgical History:  Procedure Laterality Date   TONSILLECTOMY     Social History   Social History Narrative   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      Home Situation: lives with husband and daughter      Spiritual Beliefs: Jewish      Lifestyle:  regular, aerobic and orange theory; diet healthy       Update: uses peloton 5x a week      Right handed      Caffeine: 1 cup/day   Immunization History  Administered Date(s) Administered   Influenza,inj,Quad PF,6+ Mos 12/06/2021   Influenza-Unspecified 12/16/2016, 12/25/2017, 12/16/2018, 01/16/2021, 11/19/2021   PFIZER(Purple Top)SARS-COV-2 Vaccination 05/13/2019, 06/02/2019, 02/16/2020   PPD Test 10/13/2014     Objective: Vital Signs: BP 128/87 (BP Location: Left Arm, Patient Position: Sitting, Cuff Size: Normal)   Pulse 99   Resp 14   Ht 5\' 8"  (1.727 m)   Wt 207 lb 3.2 oz (94 kg)   BMI 31.50 kg/m    Physical Exam Vitals and nursing note reviewed.  Constitutional:      Appearance: She is well-developed.  HENT:     Head: Normocephalic and atraumatic.  Eyes:     Conjunctiva/sclera: Conjunctivae normal.  Cardiovascular:     Rate and Rhythm: Normal rate and regular rhythm.     Heart sounds: Normal heart sounds.  Pulmonary:     Effort: Pulmonary effort is normal.     Breath sounds: Normal breath sounds.  Abdominal:     General: Bowel sounds are normal.     Palpations: Abdomen is soft.  Musculoskeletal:     Cervical back: Normal range of motion.  Lymphadenopathy:     Cervical: No cervical adenopathy.  Skin:    General: Skin is warm and dry.     Capillary Refill: Capillary refill takes less than 2 seconds.  Neurological:     Mental Status: She is alert and oriented to person, place, and time.  Psychiatric:         Behavior: Behavior normal.      Musculoskeletal Exam: C-spine, thoracic spine, lumbar spine have good range of motion.  Shoulder joints, elbow joints, wrist joints, MCPs, PIPs, DIPs have good range of motion with no synovitis.  Tenderness over the left fourth MCP and PIP joint.  Complete fist formation bilaterally.  Hip joints have good range of motion with no groin pain.  Knee joints have good range of motion with no warmth or effusion.  Ankle joints have good range of motion with no joint tenderness or synovitis.  CDAI Exam: CDAI Score: -- Patient Global: --; Provider Global: -- Swollen: --; Tender: -- Joint Exam 08/06/2022   No joint exam has  been documented for this visit   There is currently no information documented on the homunculus. Go to the Rheumatology activity and complete the homunculus joint exam.  Investigation: No additional findings.  Imaging: No results found.  Recent Labs: Lab Results  Component Value Date   WBC 6.6 07/17/2022   HGB 14.9 07/17/2022   PLT 247.0 07/17/2022   NA 139 04/23/2022   K 3.8 04/23/2022   CL 105 04/23/2022   CO2 28 04/23/2022   GLUCOSE 88 04/23/2022   BUN 9 04/23/2022   CREATININE 0.83 04/23/2022   BILITOT 0.5 04/23/2022   ALKPHOS 24 (L) 04/23/2022   AST 19 04/23/2022   ALT 22 04/23/2022   PROT 7.1 04/23/2022   ALBUMIN 4.4 04/23/2022   CALCIUM 9.4 04/23/2022   GFRAA 119 07/29/2019   QFTBGOLDPLUS NEGATIVE 07/29/2019    Speciality Comments: PLQ eye exam: 09/20/2021 WNL. [facility]. Follow up in 1 year.  Procedures:  No procedures performed Allergies: Depakote [divalproex sodium], Lactose, and Lactose intolerance (gi)       Assessment / Plan:     Visit Diagnoses: Autoimmune disease (HCC) - Positive ANA, positive SCL 70, aCL 42, fatigue, inflammatory arthritis (pictures noted cell phone showed MCP, left ankle joint swelling and rash in the past): She has not had any signs or symptoms of an autoimmune disease flare.   She has clinically been doing well taking Plaquenil 200 mg 1 tablet by mouth twice daily.  She is tolerating Plaquenil without any side effects and has not missed any doses recently.  Her symptoms have been stable taking Plaquenil as prescribed.  She has ongoing dry eyes despite using Systane eyedrops as needed.  Discussed switching to refresh gel lubricating drops.  If her symptoms persist or worsen she was advised to further discuss with her ophthalmologist at her upcoming visit.  She has not had any mouth dryness.  No oral or nasal ulcerations.  No cervical lymphadenopathy.  She has not had any recent rashes or increased photosensitivity.  Discussed the importance of avoiding direct sun exposure.  She has not noticed any increased hair loss.  Her energy level has been stable.  She has been practicing yoga in the mornings for exercise.  She has no synovitis on examination today. Lab work from 01/30/2022 was reviewed today in the office: Double-stranded DNA negative, complements within normal limits, ESR within normal limits, and anticardiolipin antibodies negative.  The following lab work will be obtained today for further evaluation.  She will remain on Plaquenil as prescribed.  She was advised to notify us if she develops signs or symptoms of a flare.  She will follow-up in the office in 5 months or sooner if needed. - Plan: COMPLETE METABOLIC PANEL WITH GFR, C3 and C4, Anti-DNA antibody, double-stranded, Sedimentation rate, Protein / creatinine ratio, urine, VITAMIN D 25 Hydroxy (Vit-D Deficiency, Fractures)  High risk medication use - Plaquenil 200 mg 1 tablet by mouth twice daily.  PLQ eye exam: 09/20/2021 WNL. [facility]. Follow up in 1 year.  Patient was given a Plaquenil eye examination form to take with her to her upcoming appointment. CBC WNL on 07/17/22.  CMP updated on 04/23/22.  CMP updated today. - Plan: COMPLETE METABOLIC PANEL WITH GFR  Anticardiolipin antibody positive - Anticardiolipin  antibodies were negative on 11/28/2020.  Anticardiolipin antibodies remain negative on 01/30/2022.  Pain in both hands - Ultrasound of both hands did not show any synovitis or tenosynovitis on 02/28/2021.  She has some tenderness  over the left fourth MCP and PIP joint but no synovitis was noted.  Complete fist formation bilaterally.  She remains on Plaquenil 200 mg 1 tablet by mouth twice daily.  She continues to find Plaquenil to be effective at managing her symptoms recently.  Pain of both elbows: Good range of motion of both elbow joints with no tenderness or inflammation.  Chondromalacia of both patellae: Good range of motion of both knee joints on examination today.  No warmth or effusion noted.  Vitamin D deficiency -Vitamin D level will be rechecked today.  She has not been taking a vitamin D supplement recently.  Plan: VITAMIN D 25 Hydroxy (Vit-D Deficiency, Fractures)  Other medical conditions are listed as follows:  Generalized anxiety disorder  Panic disorder  History of migraine - Started on Qulipta in October 2023.  History of COVID-19 - January 2022 and January 2023.  History of shingles - January 2023.  Resolved.  History of IBS  Family history of ulcerative colitis  Family history of Crohn's disease  Orders: Orders Placed This Encounter  Procedures   COMPLETE METABOLIC PANEL WITH GFR   C3 and C4   Anti-DNA antibody, double-stranded   Sedimentation rate   Protein / creatinine ratio, urine   VITAMIN D 25 Hydroxy (Vit-D Deficiency, Fractures)    Follow-Up Instructions: Return in about 5 months (around 01/06/2023) for Autoimmune Disease.   Gearldine Bienenstock, PA-C  Note - This record has been created using Dragon software.  Chart creation errors have been sought, but may not always  have been located. Such creation errors do not reflect on  the standard of medical care.

## 2022-08-01 DIAGNOSIS — F4322 Adjustment disorder with anxiety: Secondary | ICD-10-CM | POA: Diagnosis not present

## 2022-08-06 ENCOUNTER — Encounter: Payer: Self-pay | Admitting: Physician Assistant

## 2022-08-06 ENCOUNTER — Ambulatory Visit: Payer: BC Managed Care – PPO | Attending: Physician Assistant | Admitting: Physician Assistant

## 2022-08-06 VITALS — BP 128/87 | HR 99 | Resp 14 | Ht 68.0 in | Wt 207.2 lb

## 2022-08-06 DIAGNOSIS — Z8379 Family history of other diseases of the digestive system: Secondary | ICD-10-CM

## 2022-08-06 DIAGNOSIS — M79641 Pain in right hand: Secondary | ICD-10-CM | POA: Diagnosis not present

## 2022-08-06 DIAGNOSIS — Z8619 Personal history of other infectious and parasitic diseases: Secondary | ICD-10-CM

## 2022-08-06 DIAGNOSIS — F410 Panic disorder [episodic paroxysmal anxiety] without agoraphobia: Secondary | ICD-10-CM

## 2022-08-06 DIAGNOSIS — Z8719 Personal history of other diseases of the digestive system: Secondary | ICD-10-CM

## 2022-08-06 DIAGNOSIS — R760 Raised antibody titer: Secondary | ICD-10-CM

## 2022-08-06 DIAGNOSIS — M2241 Chondromalacia patellae, right knee: Secondary | ICD-10-CM

## 2022-08-06 DIAGNOSIS — E559 Vitamin D deficiency, unspecified: Secondary | ICD-10-CM

## 2022-08-06 DIAGNOSIS — Z8669 Personal history of other diseases of the nervous system and sense organs: Secondary | ICD-10-CM

## 2022-08-06 DIAGNOSIS — Z8616 Personal history of COVID-19: Secondary | ICD-10-CM

## 2022-08-06 DIAGNOSIS — F411 Generalized anxiety disorder: Secondary | ICD-10-CM

## 2022-08-06 DIAGNOSIS — Z79899 Other long term (current) drug therapy: Secondary | ICD-10-CM

## 2022-08-06 DIAGNOSIS — M79642 Pain in left hand: Secondary | ICD-10-CM

## 2022-08-06 DIAGNOSIS — M2242 Chondromalacia patellae, left knee: Secondary | ICD-10-CM

## 2022-08-06 DIAGNOSIS — M25521 Pain in right elbow: Secondary | ICD-10-CM

## 2022-08-06 DIAGNOSIS — M359 Systemic involvement of connective tissue, unspecified: Secondary | ICD-10-CM | POA: Diagnosis not present

## 2022-08-06 DIAGNOSIS — M25522 Pain in left elbow: Secondary | ICD-10-CM

## 2022-08-07 LAB — COMPLETE METABOLIC PANEL WITH GFR
AG Ratio: 1.8 (calc) (ref 1.0–2.5)
ALT: 16 U/L (ref 6–29)
AST: 17 U/L (ref 10–30)
Albumin: 4.4 g/dL (ref 3.6–5.1)
Alkaline phosphatase (APISO): 24 U/L — ABNORMAL LOW (ref 31–125)
BUN: 12 mg/dL (ref 7–25)
CO2: 26 mmol/L (ref 20–32)
Calcium: 9.6 mg/dL (ref 8.6–10.2)
Chloride: 105 mmol/L (ref 98–110)
Creat: 0.91 mg/dL (ref 0.50–0.97)
Globulin: 2.5 g/dL (calc) (ref 1.9–3.7)
Glucose, Bld: 89 mg/dL (ref 65–99)
Potassium: 4.3 mmol/L (ref 3.5–5.3)
Sodium: 140 mmol/L (ref 135–146)
Total Bilirubin: 0.4 mg/dL (ref 0.2–1.2)
Total Protein: 6.9 g/dL (ref 6.1–8.1)
eGFR: 86 mL/min/{1.73_m2} (ref >=60)

## 2022-08-07 LAB — PROTEIN / CREATININE RATIO, URINE
Creatinine, Urine: 116 mg/dL (ref 20–275)
Protein/Creat Ratio: 52 mg/g creat (ref 24–184)
Protein/Creatinine Ratio: 0.052 mg/mg creat (ref 0.024–0.184)
Total Protein, Urine: 6 mg/dL (ref 5–24)

## 2022-08-07 LAB — SEDIMENTATION RATE: Sed Rate: 2 mm/h (ref 0–20)

## 2022-08-07 LAB — C3 AND C4
C3 Complement: 112 mg/dL (ref 83–193)
C4 Complement: 24 mg/dL (ref 15–57)

## 2022-08-07 LAB — ANTI-DNA ANTIBODY, DOUBLE-STRANDED: ds DNA Ab: 1 IU/mL

## 2022-08-07 LAB — VITAMIN D 25 HYDROXY (VIT D DEFICIENCY, FRACTURES): Vit D, 25-Hydroxy: 38 ng/mL (ref 30–100)

## 2022-08-07 NOTE — Progress Notes (Signed)
Complements WNL

## 2022-08-07 NOTE — Progress Notes (Signed)
Alk phos is borderline low. Rest of CMP WNL. We will continue to monitor.  ESR WNL Protein creatinine ratio WNL Vitamin D WNL.

## 2022-08-08 ENCOUNTER — Other Ambulatory Visit: Payer: Self-pay

## 2022-08-08 ENCOUNTER — Other Ambulatory Visit: Payer: Self-pay | Admitting: *Deleted

## 2022-08-08 DIAGNOSIS — R748 Abnormal levels of other serum enzymes: Secondary | ICD-10-CM

## 2022-08-08 DIAGNOSIS — R5383 Other fatigue: Secondary | ICD-10-CM

## 2022-08-08 NOTE — Progress Notes (Signed)
dsDNA negative-labs are not consistent with a flare.  No medication changes at this time

## 2022-08-09 ENCOUNTER — Other Ambulatory Visit: Payer: Self-pay | Admitting: Physician Assistant

## 2022-08-09 ENCOUNTER — Other Ambulatory Visit: Payer: Self-pay | Admitting: *Deleted

## 2022-08-09 DIAGNOSIS — R748 Abnormal levels of other serum enzymes: Secondary | ICD-10-CM

## 2022-08-09 DIAGNOSIS — R5383 Other fatigue: Secondary | ICD-10-CM

## 2022-08-09 MED ORDER — SEMAGLUTIDE-WEIGHT MANAGEMENT 0.5 MG/0.5ML ~~LOC~~ SOAJ: 0.5000 mg | SUBCUTANEOUS | 1 refills | Status: DC

## 2022-08-09 NOTE — Telephone Encounter (Signed)
Please see pt message and advise 

## 2022-08-09 NOTE — Telephone Encounter (Signed)
Please see response

## 2022-08-12 LAB — MAGNESIUM: Magnesium: 2 mg/dL (ref 1.5–2.5)

## 2022-08-13 ENCOUNTER — Ambulatory Visit: Payer: BC Managed Care – PPO | Admitting: Physician Assistant

## 2022-08-13 NOTE — Progress Notes (Signed)
Vitamin B6 WNL TSH WNL Magnesium WNL

## 2022-08-14 LAB — PROTEIN ELECTROPHORESIS, SERUM, WITH REFLEX
Albumin ELP: 4.2 g/dL (ref 3.8–4.8)
Alpha 1: 0.3 g/dL (ref 0.2–0.3)
Alpha 2: 0.6 g/dL (ref 0.5–0.9)
Beta 2: 0.3 g/dL (ref 0.2–0.5)
Beta Globulin: 0.4 g/dL (ref 0.4–0.6)
Gamma Globulin: 1 g/dL (ref 0.8–1.7)
Total Protein: 6.9 g/dL (ref 6.1–8.1)

## 2022-08-14 LAB — ZINC: Zinc: 57 ug/dL — ABNORMAL LOW (ref 60–130)

## 2022-08-14 LAB — VITAMIN B6: Vitamin B6: 13.5 ng/mL (ref 2.1–21.7)

## 2022-08-14 LAB — TSH: TSH: 0.71 mIU/L

## 2022-08-14 NOTE — Progress Notes (Signed)
SPEP did not reveal any abnormal protein bands.

## 2022-08-15 DIAGNOSIS — F4322 Adjustment disorder with anxiety: Secondary | ICD-10-CM | POA: Diagnosis not present

## 2022-08-15 NOTE — Progress Notes (Signed)
Zinc is borderline low-57.  Ok to start an OTC zinc supplement.

## 2022-08-16 NOTE — Progress Notes (Unsigned)
08/19/2022 Jennifer Vincent [id number] 09-Jan-1991  Referring provider: Allwardt, Crist Infante, PA-C Primary GI doctor: Dr. Leonides Schanz  ASSESSMENT AND PLAN:   32 year old female with history of IBS with constipation, worsened with medications for her migraine prevention, recently started on Wegovy for 1 month, now with rectal bleeding, abdominal pain, family history of autoimmune disease, personal history of connective tissue disease Had Linzess in the past, will restart Linzess given 145 mcg samples, 290 sent into the pharamacy. Patient states she had labs recently checked with rheumatology which were unremarkable, will not check labs today Will plan on colonoscopy to evaluate for IBD, malignancy. Possible pelvic floor component  We have discussed the risks of bleeding, infection, perforation, medication reactions, and remote risk of death associated with colonoscopy. All questions were answered and the patient acknowledges these risk and wishes to proceed.  Antiphospholipid antibody syndrome (HCC) Has never had blood clot On Plaquenil for unspecified connective tissue disease  Family history of inflammatory bowel disease Father with UC, brother with Crohn's.  Patient Care Team: Allwardt, Crist Infante, PA-C as PCP - General (Physician Assistant) Ailene Ards, MD as Referring Physician (Obstetrics and Gynecology) Konrad Penta, MD as Referring Physician (Gastroenterology)  HISTORY OF PRESENT ILLNESS: 32 y.o. female with a past medical history of migraines, PCOS, anxiety, IBS and others listed below presents for evaluation of blood in stool.   She is on wegovy first month.  She is on [place] since Oct, this has helped her migraines but made her constipation worse.  She is on plaquinel for "unspecified" connective tissue disease presenting as joint swelling and joint pain, Oct 2020, states well controlled at this time, follows with Dr. Ferd Glassing. She went from 60 mg to 30 mg due  to severe constipation and stomach cramping. Had pelvic pressure, having BM once a week.  She has had 2 episodes of BRB in stool and on TP, last July and a month ago.  She will have cramping and sweating with it.  No rashes.  She has been taking miralax without help.  Saw digestive health specialist in 2017, had sitz marker test and was told she had very slow colon, was on linzess at that time with great help.   Never had a colonoscopy.  02/10/2022 CT AB and pelvis no acute findings Patient denies family history of colon cancer or other gastrointestinal malignancies however dad with UC, and brother with crohn's and arthritis.  She denies blood thinner use.  She denies NSAID use, rare liquid rescue COX2 She denies ETOH use.   She denies tobacco use.  She denies drug use.    She  reports that she has never smoked. She has never been exposed to tobacco smoke. She has never used smokeless tobacco. She reports that she does not currently use alcohol. She reports that she does not use drugs.  RELEVANT LABS AND IMAGING: CBC    Component Value Date/Time   WBC 6.6 07/17/2022 1307   RBC 4.70 07/17/2022 1307   HGB 14.9 07/17/2022 1307   HGB 15.2 (H) 04/23/2022 1453   HCT 43.7 07/17/2022 1307   PLT 247.0 07/17/2022 1307   PLT 278 04/23/2022 1453   MCV 93.0 07/17/2022 1307   MCH 33.0 04/23/2022 1453   MCHC 34.0 07/17/2022 1307   RDW 12.5 07/17/2022 1307   LYMPHSABS 1.9 07/17/2022 1307   MONOABS 0.3 07/17/2022 1307   EOSABS 0.1 07/17/2022 1307   BASOSABS 0.1 07/17/2022 1307   Recent Labs  01/30/22 1011 02/10/22 0915 02/19/22 1147 04/01/22 0858 04/23/22 1453 07/17/22 1307  HGB 15.7* 16.3* 14.5 16.0* 15.2* 14.9    CMP     Component Value Date/Time   NA 140 08/06/2022 1524   K 4.3 08/06/2022 1524   CL 105 08/06/2022 1524   CO2 26 08/06/2022 1524   GLUCOSE 89 08/06/2022 1524   BUN 12 08/06/2022 1524   CREATININE 0.91 08/06/2022 1524   CALCIUM 9.6 08/06/2022 1524   PROT  6.9 08/09/2022 0822   ALBUMIN 4.4 04/23/2022 1453   AST 17 08/06/2022 1524   AST 19 04/23/2022 1453   ALT 16 08/06/2022 1524   ALT 22 04/23/2022 1453   ALKPHOS 24 (L) 04/23/2022 1453   BILITOT 0.4 08/06/2022 1524   BILITOT 0.5 04/23/2022 1453   GFRNONAA >60 04/23/2022 1453   GFRNONAA 103 07/29/2019 1652   GFRAA 119 07/29/2019 1652      Latest Ref Rng & Units 08/09/2022    8:22 AM 08/06/2022    3:24 PM 04/23/2022    2:53 PM  Hepatic Function  Total Protein 6.1 - 8.1 g/dL 6.9  6.9  7.1   Albumin 3.5 - 5.0 g/dL   4.4   AST 10 - 30 U/L  17  19   ALT 6 - 29 U/L  16  22   Alk Phosphatase 38 - 126 U/L   24   Total Bilirubin 0.2 - 1.2 mg/dL  0.4  0.5       Current Medications:   Current Outpatient Medications (Endocrine & Metabolic):    JUNEL FE 1/20 1-20 MG-MCG tablet, Take 1 tablet by mouth daily.       Current Outpatient Medications (Analgesics):    Atogepant (QULIPTA) 60 MG TABS, Take 1 tablet (60 mg total) by mouth daily.   Ubrogepant (UBRELVY) 100 MG TABS, Take 1 tablet (100 mg total) by mouth every 2 (two) hours as needed. Maximum 200mg  a day.   Celecoxib (ELYXYB) 120 MG/4.8ML SOLN, Take 120 mg by mouth daily as needed. One dose maximum daily. (Patient not taking: Reported on 08/19/2022)     Current Outpatient Medications (Other):    ALPRAZolam (XANAX) 0.25 MG tablet, TAKE 1 TABLET(0.25 MG) BY MOUTH TWICE DAILY AS NEEDED FOR ANXIETY   busPIRone (BUSPAR) 15 MG tablet, TAKE 2 TABLETS(30 MG) BY MOUTH TWICE DAILY   hydroxychloroquine (PLAQUENIL) 200 MG tablet, Take 1 tablet (200 mg total) by mouth 2 (two) times daily.   hydrOXYzine (ATARAX) 25 MG tablet, Take 1 tablet (25 mg total) by mouth at bedtime as needed for anxiety.   hyoscyamine (LEVSIN SL) 0.125 MG SL tablet, Place 1 tablet (0.125 mg total) under the tongue every 6 (six) hours as needed for cramping (nausea, diarrhea).   linaclotide (LINZESS) 290 MCG CAPS capsule, Take 1 capsule (290 mcg total) by mouth daily before  breakfast.   ondansetron (ZOFRAN) 4 MG tablet, TAKE 1 TABLET(4 MG) BY MOUTH EVERY 8 HOURS AS NEEDED FOR NAUSEA OR VOMITING   Semaglutide-Weight Management 0.5 MG/0.5ML SOAJ, Inject 0.5 mg into the skin once a week.   sertraline (ZOLOFT) 100 MG tablet, TAKE 1 TABLET(100 MG) BY MOUTH DAILY   scopolamine (TRANSDERM-SCOP) 1 MG/3DAYS, Place 1 patch (1.5 mg total) onto the skin every 3 (three) days.  Current Facility-Administered Medications (Other):    botulinum toxin Type A (BOTOX) injection 155 Units   botulinum toxin Type A (BOTOX) injection 155 Units  Medical History:  Past Medical History:  Diagnosis Date   Allergic  rhinitis    Anxiety    Autoimmune disorder (HCC)    nonspecified   COVID 04/05/2020   Frequent headaches    IBS (irritable bowel syndrome)    Migraine    PCOS (polycystic ovarian syndrome)    Allergies:  Allergies  Allergen Reactions   Depakote [Divalproex Sodium]     Depression/suicidal thoughts   Lactose Diarrhea    GI upset   Lactose Intolerance (Gi)     GI upset     Surgical History:  She  has a past surgical history that includes Tonsillectomy. Family History:  Her family history includes Alcohol abuse in her brother; Anxiety disorder in her brother and maternal grandfather; Arthritis in her brother and father; Breast cancer in her maternal grandmother; Crohn's disease in her brother and father; Depression in her brother and mother; Diabetes in her paternal grandfather; Eating disorder in her mother; Healthy in her daughter and son; Heart attack in her paternal grandfather; Heart disease in her paternal grandfather; Mental illness in her brother and maternal grandfather; Migraines in her maternal uncle; OCD in her maternal grandfather; Ulcerative colitis in her father.  REVIEW OF SYSTEMS  : All other systems reviewed and negative except where noted in the History of Present Illness.  PHYSICAL EXAM: BP 110/74   Pulse (!) 111   Ht 5\' 8"  (1.727 m)   Wt 202  lb (91.6 kg)   BMI 30.71 kg/m  General Appearance: Well nourished, in no apparent distress. Head:   Normocephalic and atraumatic. Eyes:  sclerae anicteric,conjunctive pink  Respiratory: Respiratory effort normal, BS equal bilaterally without rales, rhonchi, wheezing. Cardio: RRR with no MRGs. Peripheral pulses intact.  Abdomen: Soft,  Non-distended ,active bowel sounds. mild tenderness in the lower abdomen. Without guarding and Without rebound. No masses. Rectal: Not evaluated Musculoskeletal: Full ROM, Normal gait. Without edema. Skin:  Dry and intact without significant lesions or rashes Neuro: Alert and  oriented x4;  No focal deficits. Psych:  Cooperative. Normal mood and affect.    Doree Albee, PA-C 3:52 PM

## 2022-08-19 ENCOUNTER — Encounter: Payer: Self-pay | Admitting: Neurology

## 2022-08-19 ENCOUNTER — Telehealth: Payer: Self-pay | Admitting: Neurology

## 2022-08-19 ENCOUNTER — Ambulatory Visit (INDEPENDENT_AMBULATORY_CARE_PROVIDER_SITE_OTHER): Payer: BC Managed Care – PPO | Admitting: Physician Assistant

## 2022-08-19 ENCOUNTER — Encounter: Payer: Self-pay | Admitting: Physician Assistant

## 2022-08-19 VITALS — BP 110/74 | HR 111 | Ht 68.0 in | Wt 202.0 lb

## 2022-08-19 DIAGNOSIS — K625 Hemorrhage of anus and rectum: Secondary | ICD-10-CM

## 2022-08-19 DIAGNOSIS — Z8379 Family history of other diseases of the digestive system: Secondary | ICD-10-CM

## 2022-08-19 DIAGNOSIS — D6861 Antiphospholipid syndrome: Secondary | ICD-10-CM

## 2022-08-19 DIAGNOSIS — K5904 Chronic idiopathic constipation: Secondary | ICD-10-CM | POA: Diagnosis not present

## 2022-08-19 MED ORDER — NA SULFATE-K SULFATE-MG SULF 17.5-3.13-1.6 GM/177ML PO SOLN: 1.0000 | Freq: Once | ORAL | 0 refills | Status: AC

## 2022-08-19 MED ORDER — HYOSCYAMINE SULFATE 0.125 MG SL SUBL: 0.1250 mg | SUBLINGUAL_TABLET | Freq: Four times a day (QID) | SUBLINGUAL | 1 refills | Status: DC | PRN

## 2022-08-19 MED ORDER — LINACLOTIDE 290 MCG PO CAPS: 290.0000 ug | ORAL_CAPSULE | Freq: Every day | ORAL | 0 refills | Status: DC

## 2022-08-19 NOTE — Telephone Encounter (Signed)
Pt is asking for a call to discuss a 7:30 botox slot, please call to discuss available slots.

## 2022-08-19 NOTE — Patient Instructions (Addendum)
You have been scheduled for a colonoscopy. Please follow written instructions given to you at your visit today.  Please pick up your prep supplies at the pharmacy within the next 1-3 days. If you use inhalers (even only as needed), please bring them with you on the day of your procedure.  _______________________________________________________  If your blood pressure at your visit was 140/90 or greater, please contact your primary care physician to follow up on this.  _______________________________________________________  If you are age 60 or older, your body mass index should be between 23-30. Your Body mass index is 30.71 kg/m. If this is out of the aforementioned range listed, please consider follow up with your Primary Care Provider.  If you are age 53 or younger, your body mass index should be between 19-25. Your Body mass index is 30.71 kg/m. If this is out of the aformentioned range listed, please consider follow up with your Primary Care Provider.   ________________________________________________________  The [place] [facility] providers would like to encourage you to use [facility] to communicate with providers for non-urgent requests or questions.  Due to long hold times on the telephone, sending your provider a message by [facility] may be a faster and more efficient way to get a response.  Please allow 48 business hours for a response.  Please remember that this is for non-urgent requests.  _______________________________________________________ It was a pleasure to see you today!  Thank you for trusting me with your gastrointestinal care!

## 2022-08-20 ENCOUNTER — Ambulatory Visit: Payer: BC Managed Care – PPO | Admitting: Neurology

## 2022-08-20 NOTE — Progress Notes (Signed)
I agree with the assessment and plan as outlined by Ms. Collier. 

## 2022-08-20 NOTE — Telephone Encounter (Signed)
Pt sent [place] a message via mychart. I have messaged her back with a spot offered. Will continue documentation in mychart.

## 2022-08-22 ENCOUNTER — Other Ambulatory Visit: Payer: Self-pay | Admitting: Physician Assistant

## 2022-08-22 ENCOUNTER — Telehealth: Payer: Self-pay

## 2022-08-22 ENCOUNTER — Ambulatory Visit (INDEPENDENT_AMBULATORY_CARE_PROVIDER_SITE_OTHER): Payer: BC Managed Care – PPO | Admitting: Neurology

## 2022-08-22 DIAGNOSIS — G43711 Chronic migraine without aura, intractable, with status migrainosus: Secondary | ICD-10-CM

## 2022-08-22 DIAGNOSIS — M359 Systemic involvement of connective tissue, unspecified: Secondary | ICD-10-CM

## 2022-08-22 MED ORDER — KETOROLAC TROMETHAMINE 60 MG/2ML IM SOLN
60.0000 mg | Freq: Once | INTRAMUSCULAR | Status: AC
  Administered 2022-08-22: 60 mg via INTRAMUSCULAR

## 2022-08-22 NOTE — Telephone Encounter (Signed)
Last Fill: 05/24/2022  Eye exam: 09/20/2021 WNL.   Labs: 08/06/2022 Alk phos is borderline low. Rest of CMP WNL. 07/17/2022 CBC WNL  Next Visit: 01/16/2023  Last Visit: 08/06/2022  DX: Autoimmune disease    Current Dose per office note 08/06/2022: Plaquenil 200 mg 1 tablet by mouth twice daily   Okay to refill Plaquenil?

## 2022-08-22 NOTE — Progress Notes (Signed)
Botox- 200 units x 1 vial Lot: [id number] Expiration: 08/2024 NDC: 4540-9811-91  Bacteriostatic 0.9% Sodium Chloride- 4 mL  Lot: [id number] Expiration: 06/17/2023 NDC: 6213-0865-78  Dx: [id number] Samples Witnessed by [facility]

## 2022-08-22 NOTE — Progress Notes (Signed)
Consent Form Botulism Toxin Injection For Chronic Migraine   08/22/2022: 06/19/2023 stable; SAMPLES TODAY bill by time 06/19/2023 stable; SAMPLES TODAY bill by time  022/07/2022: Stable doing well on botox and quilipta. Samples botox, spent time writing qulipta/botox request fo rboth insurnce is giving [place] problems approving both but the combination has been spectacular for her   02/25/2022: Doing well 5 migraine days a month and the [place] helps acutely. +a. Next would try Vyepti. May move botox out to every 10 weeks next time. SAMPLES TODAY bill by time  Reviewed orally with patient, additionally signature is on file:  Botulism toxin has been approved by the Federal drug administration for treatment of chronic migraine. Botulism toxin does not cure chronic migraine and it may not be effective in some patients.  The administration of botulism toxin is accomplished by injecting a small amount of toxin into the muscles of the neck and head. Dosage must be titrated for each individual. Any benefits resulting from botulism toxin tend to wear off after 3 months with a repeat injection required if benefit is to be maintained. Injections are usually done every 3-4 months with maximum effect peak achieved by about 2 or 3 weeks. Botulism toxin is expensive and you should be sure of what costs you will incur resulting from the injection.  The side effects of botulism toxin use for chronic migraine may include:   -Transient, and usually mild, facial weakness with facial injections  -Transient, and usually mild, head or neck weakness with head/neck injections  -Reduction or loss of forehead facial animation due to forehead muscle weakness  -Eyelid drooping  -Dry eye  -Pain at the site of injection or bruising at the site of injection  -Double vision  -Potential unknown long term risks   Contraindications: You should not have Botox if you are pregnant, nursing, allergic to albumin, have  an infection, skin condition, or muscle weakness at the site of the injection, or have myasthenia gravis, Lambert-Eaton syndrome, or ALS.  It is also possible that as with any injection, there may be an allergic reaction or no effect from the medication. Reduced effectiveness after repeated injections is sometimes seen and rarely infection at the injection site may occur. All care will be taken to prevent these side effects. If therapy is given over a long time, atrophy and wasting in the muscle injected may occur. Occasionally the patient's become refractory to treatment because they develop antibodies to the toxin. In this event, therapy needs to be modified.  I have read the above information and consent to the administration of botulism toxin.    BOTOX PROCEDURE NOTE FOR MIGRAINE HEADACHE  Contraindications and precautions discussed with patient(above). Aseptic procedure was observed and patient tolerated procedure. Procedure performed by Ihor Austin, NP-BC.   The condition has existed for more than 6 months, and pt does not have a diagnosis of ALS, Myasthenia Gravis or Lambert-Eaton Syndrome.  Risks and benefits of injections discussed and pt agrees to proceed with the procedure.  Written consent obtained  These injections are medically necessary. Pt  receives good benefits from these injections. These injections do not cause sedations or hallucinations which the oral therapies may cause.   Description of procedure:  The patient was placed in a sitting position. The standard protocol was used for Botox as follows, with 5 units of Botox injected at each site:  -Procerus muscle, midline injection  -Corrugator muscle, bilateral injection  -Frontalis muscle, bilateral injection,  with 2 sites each side, medial injection was performed in the upper one third of the frontalis muscle, in the region vertical from the medial inferior edge of the superior orbital rim. The lateral injection was  again in the upper one third of the forehead vertically above the lateral limbus of the cornea, 1.5 cm lateral to the medial injection site.  -Temporalis muscle injection, 4 sites, bilaterally. The first injection was 3 cm above the tragus of the ear, second injection site was 1.5 cm to 3 cm up from the first injection site in line with the tragus of the ear. The third injection site was 1.5-3 cm forward between the first 2 injection sites. The fourth injection site was 1.5 cm posterior to the second injection site. 5th site laterally in the temporalis  muscleat the level of the outer canthus.  -Occipitalis muscle injection, 3 sites, bilaterally. The first injection was done one half way between the occipital protuberance and the tip of the mastoid process behind the ear. The second injection site was done lateral and superior to the first, 1 fingerbreadth from the first injection. The third injection site was 1 fingerbreadth superiorly and medially from the first injection site.  -Cervical paraspinal muscle injection, 2 sites, bilaterally. The first injection site was 1 cm from the midline of the cervical spine, 3 cm inferior to the lower border of the occipital protuberance. The second injection site was 1.5 cm superiorly and laterally to the first injection site.  -Trapezius muscle injection was performed at 3 sites, bilaterally. The first injection site was in the upper trapezius muscle halfway between the inflection point of the neck, and the acromion. The second injection site was one half way between the acromion and the first injection site. The third injection was done between the first injection site and the inflection point of the neck.  -Masseters: 2 sites bilaterally, 5 units each side   Will return for repeat injection in 3 months.   A total of 200 units of Botox was prepared, 45 units wasted Botox not injected was wasted. The patient tolerated the procedure well, there were no  complications of the above procedure.  I spent 30 minutes of face-to-face and non-face-to-face time with patient on the  1. Chronic migraine without aura, with intractable migraine, so stated, with status migrainosus    diagnosis.  This included previsit chart review, lab review, study review, order entry, electronic health record documentation, patient education on the different diagnostic and therapeutic options, counseling and coordination of care, risks and benefits of management, compliance, or risk factor reduction and botox procedure.

## 2022-08-22 NOTE — Telephone Encounter (Signed)
Pt looking for an update on PA since been over 1 week. Please advise

## 2022-08-23 ENCOUNTER — Other Ambulatory Visit: Payer: Self-pay

## 2022-08-23 ENCOUNTER — Encounter (HOSPITAL_BASED_OUTPATIENT_CLINIC_OR_DEPARTMENT_OTHER): Payer: Self-pay | Admitting: Obstetrics and Gynecology

## 2022-08-23 DIAGNOSIS — N921 Excessive and frequent menstruation with irregular cycle: Secondary | ICD-10-CM | POA: Diagnosis not present

## 2022-08-23 DIAGNOSIS — Z3202 Encounter for pregnancy test, result negative: Secondary | ICD-10-CM | POA: Diagnosis not present

## 2022-08-23 NOTE — Telephone Encounter (Signed)
Is this for [facility]?  Plan does not cover any weightloss medications. Full letter in media

## 2022-08-23 NOTE — Progress Notes (Addendum)
Spoke w/ via phone for pre-op interview---pt Lab needs dos----  urine preg poct   , surgery orders req dr Rudene Christians epic ib          Lab results------none COVID test -----patient states asymptomatic no test needed Arrive at -------530 am 08-27-2022 NPO after MN NO Solid Food.  Clear liquids from MN until---430 am Med rec completed Medications to take morning of surgery -----Buspar Diabetic medication -----semaglutide for weight management last dose 08-18-2022 Patient instructed no nail polish to be worn day of surgery Patient instructed to bring photo id and insurance card day of surgery Patient aware to have Driver (ride ) / caregiver    husband matt for 24 hours after surgery  Patient Special Instructions -----none Pre-Op special Instructions -----none Patient verbalized understanding of instructions that were given at this phone interview. Patient denies shortness of breath, chest pain, fever, cough at this phone interview.

## 2022-08-26 NOTE — Anesthesia Preprocedure Evaluation (Signed)
Anesthesia Evaluation  Patient identified by MRN, date of birth, ID band Patient awake    Reviewed: Allergy & Precautions, NPO status , Patient's Chart, lab work & pertinent test results  Airway Mallampati: I       Dental no notable dental hx.    Pulmonary neg pulmonary ROS   Pulmonary exam normal        Cardiovascular negative cardio ROS Normal cardiovascular exam     Neuro/Psych  Headaches PSYCHIATRIC DISORDERS Anxiety Depression       GI/Hepatic negative GI ROS, Neg liver ROS,,,  Endo/Other    Renal/GU negative Renal ROS  negative genitourinary   Musculoskeletal negative musculoskeletal ROS (+)    Abdominal Normal abdominal exam  (+)   Peds  Hematology negative hematology ROS (+)   Anesthesia Other Findings   Reproductive/Obstetrics                             Anesthesia Physical Anesthesia Plan  ASA: 2  Anesthesia Plan: General   Post-op Pain Management: Minimal or no pain anticipated   Induction: Intravenous  PONV Risk Score and Plan: 4 or greater and Ondansetron, Dexamethasone and Midazolam  Airway Management Planned: LMA  Additional Equipment: None  Intra-op Plan:   Post-operative Plan: Extubation in OR  Informed Consent: I have reviewed the patients History and Physical, chart, labs and discussed the procedure including the risks, benefits and alternatives for the proposed anesthesia with the patient or authorized representative who has indicated his/her understanding and acceptance.     Dental advisory given  Plan Discussed with: CRNA  Anesthesia Plan Comments:        Anesthesia Quick Evaluation

## 2022-08-26 NOTE — H&P (Signed)
Jennifer Vincent is an 32 y.o. female. She has abnormal bleeding not controlled with OCP. U/S in office noted normal ovaries and no FF. There is a 9 mm EM mass.  Pertinent Gynecological History: Menses: flow is excessive with use of many pads or tampons on heaviest days Bleeding: dysfunctional uterine bleeding Contraception: OCP (estrogen/progesterone) DES exposure: denies Blood transfusions: none Sexually transmitted diseases: no past history Previous GYN Procedures:  Last mammogram: Date: Last pap: Date:  OB History: G2, P2   Menstrual History: Menarche age:  Patient's last menstrual period was 08/05/2022.    Past Medical History:  Diagnosis Date   Abnormal uterine bleeding (AUB)    Allergic rhinitis    Anxiety    Autoimmune disorder (HCC)    nonspecified   COVID 04/05/2020   Frequent migraine    IBS (irritable bowel syndrome)    Migraine    PCOS (polycystic ovarian syndrome)     Past Surgical History:  Procedure Laterality Date   TONSILLECTOMY  2011    Family History  Problem Relation Age of Onset   Depression Mother    Eating disorder Mother    Arthritis Father    Crohn's disease Father    Ulcerative colitis Father    Arthritis Brother    Alcohol abuse Brother    Mental illness Brother    Depression Brother    Anxiety disorder Brother    Crohn's disease Brother    Migraines Maternal Uncle    Breast cancer Maternal Grandmother    Mental illness Maternal Grandfather    Anxiety disorder Maternal Grandfather    OCD Maternal Grandfather    Heart disease Paternal Grandfather    Diabetes Paternal Grandfather    Heart attack Paternal Grandfather    Healthy Daughter    Healthy Son     Social History:  reports that she has never smoked. She has never been exposed to tobacco smoke. She has never used smokeless tobacco. She reports that she does not currently use alcohol. She reports that she does not use drugs.  Allergies:  Allergies  Allergen Reactions    Depakote [Divalproex Sodium]     Depression/suicidal thoughts   Lactose Diarrhea    GI upset   Lactose Intolerance (Gi)     GI upset    No medications prior to admission.    Review of Systems  Constitutional:  Negative for fever.    Height 5\' 8"  (1.727 m), weight 90.7 kg, last menstrual period 08/05/2022, not currently breastfeeding. Physical Exam Cardiovascular:     Rate and Rhythm: Normal rate.  Pulmonary:     Effort: Pulmonary effort is normal.     No results found for this or any previous visit (from the past 24 hour(s)).  No results found.  Assessment/Plan: 32 yo with abnormal uterine bleeding D/W hysteroscopy, D&C and possible Myosure resection and risks including infection, uterine perforation and organ damage, bleeding/transfusion-HIV/Hep, DVT/PE, pneumonia, persistent or recurrent abnormal bleeding. She states she understands and agrees.  Jennifer Vincent II 08/26/2022, 4:14 PM

## 2022-08-27 ENCOUNTER — Encounter (HOSPITAL_BASED_OUTPATIENT_CLINIC_OR_DEPARTMENT_OTHER)
Admission: RE | Disposition: A | Discharge status: Home / Self Care | Payer: Self-pay | Source: Home / Self Care | Attending: Obstetrics and Gynecology

## 2022-08-27 ENCOUNTER — Ambulatory Visit (HOSPITAL_BASED_OUTPATIENT_CLINIC_OR_DEPARTMENT_OTHER)
Admission: RE | Admit: 2022-08-27 | Discharge: 2022-08-27 | Disposition: A | Discharge status: Home / Self Care | Payer: BC Managed Care – PPO | Attending: Obstetrics and Gynecology | Admitting: Obstetrics and Gynecology

## 2022-08-27 ENCOUNTER — Ambulatory Visit (HOSPITAL_BASED_OUTPATIENT_CLINIC_OR_DEPARTMENT_OTHER): Payer: BC Managed Care – PPO | Admitting: Anesthesiology

## 2022-08-27 ENCOUNTER — Encounter (HOSPITAL_BASED_OUTPATIENT_CLINIC_OR_DEPARTMENT_OTHER): Payer: Self-pay | Admitting: Obstetrics and Gynecology

## 2022-08-27 ENCOUNTER — Other Ambulatory Visit: Payer: Self-pay

## 2022-08-27 DIAGNOSIS — N939 Abnormal uterine and vaginal bleeding, unspecified: Secondary | ICD-10-CM | POA: Insufficient documentation

## 2022-08-27 DIAGNOSIS — Z01818 Encounter for other preprocedural examination: Secondary | ICD-10-CM

## 2022-08-27 HISTORY — PX: DILATATION & CURETTAGE/HYSTEROSCOPY WITH MYOSURE: SHX6511

## 2022-08-27 HISTORY — DX: Abnormal uterine and vaginal bleeding, unspecified: N93.9

## 2022-08-27 LAB — CBC
HCT: 41.1 % (ref 36.0–46.0)
Hemoglobin: 14.1 g/dL (ref 12.0–15.0)
MCH: 31.7 pg (ref 26.0–34.0)
MCHC: 34.3 g/dL (ref 30.0–36.0)
MCV: 92.4 fL (ref 80.0–100.0)
Platelets: 226 10*3/uL (ref 150–400)
RBC: 4.45 MIL/uL (ref 3.87–5.11)
RDW: 12.1 % (ref 11.5–15.5)
WBC: 4.9 10*3/uL (ref 4.0–10.5)
nRBC: 0 % (ref 0.0–0.2)

## 2022-08-27 LAB — TYPE AND SCREEN
ABO/RH(D): A POS
Antibody Screen: NEGATIVE

## 2022-08-27 LAB — POCT PREGNANCY, URINE: Preg Test, Ur: NEGATIVE

## 2022-08-27 LAB — HIV ANTIBODY (ROUTINE TESTING W REFLEX): HIV Screen 4th Generation wRfx: NONREACTIVE

## 2022-08-27 SURGERY — DILATATION & CURETTAGE/HYSTEROSCOPY WITH MYOSURE
Anesthesia: General | Site: Uterus

## 2022-08-27 MED ORDER — CEFAZOLIN SODIUM-DEXTROSE 2-4 GM/100ML-% IV SOLN
2.0000 g | Freq: Once | INTRAVENOUS | Status: AC
  Administered 2022-08-27: 2 g via INTRAVENOUS

## 2022-08-27 MED ORDER — DEXAMETHASONE SODIUM PHOSPHATE 4 MG/ML IJ SOLN
INTRAMUSCULAR | Status: DC | PRN
  Administered 2022-08-27: 5 mg via INTRAVENOUS

## 2022-08-27 MED ORDER — DEXMEDETOMIDINE HCL IN NACL 80 MCG/20ML IV SOLN
INTRAVENOUS | Status: AC
  Filled 2022-08-27: qty 20

## 2022-08-27 MED ORDER — PROPOFOL 10 MG/ML IV BOLUS
INTRAVENOUS | Status: DC | PRN
  Administered 2022-08-27: 50 mg via INTRAVENOUS
  Administered 2022-08-27: 150 mg via INTRAVENOUS
  Administered 2022-08-27: 20 mg via INTRAVENOUS
  Administered 2022-08-27: 50 mg via INTRAVENOUS
  Administered 2022-08-27: 30 mg via INTRAVENOUS
  Administered 2022-08-27: 100 mg via INTRAVENOUS

## 2022-08-27 MED ORDER — POVIDONE-IODINE 10 % EX SWAB: 2.0000 | Freq: Once | CUTANEOUS | Status: DC

## 2022-08-27 MED ORDER — KETOROLAC TROMETHAMINE 30 MG/ML IJ SOLN: 30.0000 mg | Freq: Once | INTRAMUSCULAR | Status: DC | PRN

## 2022-08-27 MED ORDER — OXYCODONE HCL 5 MG PO TABS
5.0000 mg | ORAL_TABLET | Freq: Once | ORAL | Status: AC | PRN
  Administered 2022-08-27: 5 mg via ORAL

## 2022-08-27 MED ORDER — DEXMEDETOMIDINE HCL IN NACL 80 MCG/20ML IV SOLN
INTRAVENOUS | Status: DC | PRN
  Administered 2022-08-27 (×2): 4 ug via INTRAVENOUS

## 2022-08-27 MED ORDER — ONDANSETRON HCL 4 MG/2ML IJ SOLN
INTRAMUSCULAR | Status: AC
  Filled 2022-08-27: qty 2

## 2022-08-27 MED ORDER — ONDANSETRON HCL 4 MG/2ML IJ SOLN
INTRAMUSCULAR | Status: DC | PRN
  Administered 2022-08-27: 4 mg via INTRAVENOUS

## 2022-08-27 MED ORDER — LACTATED RINGERS IV SOLN: INTRAVENOUS | Status: DC

## 2022-08-27 MED ORDER — CEFAZOLIN SODIUM-DEXTROSE 2-4 GM/100ML-% IV SOLN: 2.0000 g | INTRAVENOUS | Status: DC

## 2022-08-27 MED ORDER — FENTANYL CITRATE (PF) 100 MCG/2ML IJ SOLN
INTRAMUSCULAR | Status: AC
  Filled 2022-08-27: qty 2

## 2022-08-27 MED ORDER — KETOROLAC TROMETHAMINE 30 MG/ML IJ SOLN
INTRAMUSCULAR | Status: DC | PRN
  Administered 2022-08-27: 30 mg via INTRAVENOUS

## 2022-08-27 MED ORDER — MIDAZOLAM HCL 2 MG/2ML IJ SOLN
INTRAMUSCULAR | Status: AC
  Filled 2022-08-27: qty 2

## 2022-08-27 MED ORDER — PROPOFOL 10 MG/ML IV BOLUS
INTRAVENOUS | Status: AC
  Filled 2022-08-27: qty 20

## 2022-08-27 MED ORDER — DEXAMETHASONE SODIUM PHOSPHATE 10 MG/ML IJ SOLN
INTRAMUSCULAR | Status: AC
  Filled 2022-08-27: qty 1

## 2022-08-27 MED ORDER — MEPERIDINE HCL 25 MG/ML IJ SOLN: 6.2500 mg | INTRAMUSCULAR | Status: DC | PRN

## 2022-08-27 MED ORDER — OXYCODONE HCL 5 MG PO TABS
ORAL_TABLET | ORAL | Status: AC
  Filled 2022-08-27: qty 1

## 2022-08-27 MED ORDER — CEFAZOLIN SODIUM-DEXTROSE 2-4 GM/100ML-% IV SOLN
INTRAVENOUS | Status: AC
  Filled 2022-08-27: qty 100

## 2022-08-27 MED ORDER — OXYCODONE HCL 5 MG/5ML PO SOLN: 5.0000 mg | Freq: Once | ORAL | Status: AC | PRN

## 2022-08-27 MED ORDER — LIDOCAINE HCL (PF) 2 % IJ SOLN
INTRAMUSCULAR | Status: AC
  Filled 2022-08-27: qty 5

## 2022-08-27 MED ORDER — SODIUM CHLORIDE 0.9 % IR SOLN
Status: DC | PRN
  Administered 2022-08-27: 3000 mL

## 2022-08-27 MED ORDER — ACETAMINOPHEN 160 MG/5ML PO SOLN: 325.0000 mg | ORAL | Status: DC | PRN

## 2022-08-27 MED ORDER — ONDANSETRON HCL 4 MG/2ML IJ SOLN: 4.0000 mg | Freq: Once | INTRAMUSCULAR | Status: DC | PRN

## 2022-08-27 MED ORDER — SOD CITRATE-CITRIC ACID 500-334 MG/5ML PO SOLN: 30.0000 mL | ORAL | Status: DC

## 2022-08-27 MED ORDER — LIDOCAINE HCL (CARDIAC) PF 100 MG/5ML IV SOSY
PREFILLED_SYRINGE | INTRAVENOUS | Status: DC | PRN
  Administered 2022-08-27: 100 mg via INTRAVENOUS

## 2022-08-27 MED ORDER — LIDOCAINE HCL 1 % IJ SOLN
INTRAMUSCULAR | Status: DC | PRN
  Administered 2022-08-27: 20 mL

## 2022-08-27 MED ORDER — MIDAZOLAM HCL 5 MG/5ML IJ SOLN
INTRAMUSCULAR | Status: DC | PRN
  Administered 2022-08-27: 2 mg via INTRAVENOUS

## 2022-08-27 MED ORDER — FENTANYL CITRATE (PF) 100 MCG/2ML IJ SOLN: 25.0000 ug | INTRAMUSCULAR | Status: DC | PRN

## 2022-08-27 MED ORDER — ACETAMINOPHEN 325 MG PO TABS: 325.0000 mg | ORAL_TABLET | ORAL | Status: DC | PRN

## 2022-08-27 MED ORDER — FENTANYL CITRATE (PF) 100 MCG/2ML IJ SOLN
INTRAMUSCULAR | Status: DC | PRN
  Administered 2022-08-27 (×2): 25 ug via INTRAVENOUS
  Administered 2022-08-27: 50 ug via INTRAVENOUS

## 2022-08-27 SURGICAL SUPPLY — 23 items
CATH ROBINSON RED A/P 16FR (CATHETERS) ×1 IMPLANT
DEVICE MYOSURE LITE (MISCELLANEOUS) IMPLANT
DEVICE MYOSURE REACH (MISCELLANEOUS) IMPLANT
DILATOR CANAL MILEX (MISCELLANEOUS) IMPLANT
DRSG TELFA 3X8 NADH STRL (GAUZE/BANDAGES/DRESSINGS) ×1 IMPLANT
ELECT REM PT RETURN 9FT ADLT (ELECTROSURGICAL)
ELECTRODE REM PT RTRN 9FT ADLT (ELECTROSURGICAL) IMPLANT
GAUZE 4X4 16PLY ~~LOC~~+RFID DBL (SPONGE) ×1 IMPLANT
GLOVE BIO SURGEON STRL SZ7 (GLOVE) IMPLANT
GLOVE BIO SURGEON STRL SZ8 (GLOVE) ×1 IMPLANT
GLOVE BIOGEL PI IND STRL 7.0 (GLOVE) IMPLANT
GOWN STRL REUS W/TWL LRG LVL3 (GOWN DISPOSABLE) IMPLANT
GOWN STRL REUS W/TWL XL LVL3 (GOWN DISPOSABLE) ×1 IMPLANT
IV NS IRRIG 3000ML ARTHROMATIC (IV SOLUTION) ×2 IMPLANT
KIT PROCEDURE FLUENT (KITS) ×1 IMPLANT
KIT TURNOVER CYSTO (KITS) ×1 IMPLANT
PACK VAGINAL MINOR WOMEN LF (CUSTOM PROCEDURE TRAY) ×1 IMPLANT
PAD OB MATERNITY 4.3X12.25 (PERSONAL CARE ITEMS) ×1 IMPLANT
PAD PREP 24X48 CUFFED NSTRL (MISCELLANEOUS) ×1 IMPLANT
SEAL CERVICAL OMNI LOK (ABLATOR) IMPLANT
SEAL ROD LENS SCOPE MYOSURE (ABLATOR) ×1 IMPLANT
SLEEVE SCD COMPRESS KNEE MED (STOCKING) ×1 IMPLANT
TOWEL OR 17X24 6PK STRL BLUE (TOWEL DISPOSABLE) ×1 IMPLANT

## 2022-08-27 NOTE — Discharge Instructions (Addendum)
   D & C Home care Instructions:   Personal hygiene:  Used sanitary napkins for vaginal drainage not tampons. Shower or tub bathe the day after your procedure. No douching until bleeding stops. Always wipe from front to back after  Elimination.  Activity: Do not drive or operate any equipment today. The effects of the anesthesia are still present and drowsiness may result. Rest today, not necessarily flat bed rest, just take it easy. You may resume your normal activity in one to 2 days.  Sexual activity: No intercourse for one week or as indicated by your physician  Diet: Eat a light diet as desired this evening. You may resume a regular diet tomorrow.  Return to work: One to 2 days.  General Expectations of your surgery: Vaginal bleeding should be no heavier than a normal period. Spotting may continue up to 10 days. Mild cramps may continue for a couple of days. You may have a regular period in 2-6 weeks.  Unexpected observations call your doctor if these occur: persistent or heavy bleeding. Severe abdominal cramping or pain. Elevation of temperature greater than 100F.   Post Anesthesia Home Care Instructions  Activity: Get plenty of rest for the remainder of the day. A responsible individual must stay with you for 24 hours following the procedure.  For the next 24 hours, DO NOT: -Drive a car -Advertising copywriter -Drink alcoholic beverages -Take any medication unless instructed by your physician -Make any legal decisions or sign important papers.  Meals: Start with liquid foods such as gelatin or soup. Progress to regular foods as tolerated. Avoid greasy, spicy, heavy foods. If nausea and/or vomiting occur, drink only clear liquids until the nausea and/or vomiting subsides. Call your physician if vomiting continues.  Special Instructions/Symptoms: Your throat may feel dry or sore from the anesthesia or the breathing tube placed in your throat during surgery. If this causes  discomfort, gargle with warm salt water. The discomfort should disappear within 24 hours.  No ibuprofen, Advil, Aleve, Motrin, ketorolac, meloxicam, naproxen, or other NSAIDS until after 1:45 pm today if needed.

## 2022-08-27 NOTE — Op Note (Unsigned)
NAMEZILLA, SHARTZER MEDICAL RECORD NO: [id number] ACCOUNT NO: [id number] DATE OF BIRTH: 1990/04/01 FACILITY: [facility] LOCATION: [facility] PHYSICIAN: Guy Sandifer. Arleta Creek, MD  Operative Report   DATE OF PROCEDURE: 08/27/2022  PREOPERATIVE DIAGNOSIS:  Abnormal uterine bleeding.  POSTOPERATIVE DIAGNOSIS:  Abnormal uterine bleeding.  PROCEDURE:  Hysteroscopy, dilation and curettage.  ASSISTANT:  Guy Sandifer. Arleta Creek, MD  ANESTHESIA:  General with LMA.  ESTIMATED BLOOD LOSS:  30 mL.  SPECIMENS:  Endometrial curettings to pathology.  INDICATIONS AND CONSENT: This patient is a 32 year old G2 P2 with abnormal uterine bleeding.  Hysteroscopy, dilation and curettage, possible MyoSure resection has been discussed preoperatively.  Potential risks and complications are discussed  preoperatively including but not limited to infection, uterine perforation, organ damage, bleeding requiring transfusion of blood products with HIV and hepatitis acquisition, DVT, PE, pneumonia, as well as persistent or recurrent abnormal bleeding.  The  patient states she understands and agrees and consent is signed on the chart.  FINDINGS: Both fallopian tube and ostia are identified.  The upper endocervical canal has exaggerated endocervical folds, possibly consistent with polyps.  There was good distention of the cavity.  DESCRIPTION OF PROCEDURE:  The patient was taken to the operating room where she was identified, placed in the dorsal supine position and anesthesia was administered via LMA.  She was placed in the dorsal lithotomy position.  She was prepped vaginally  with Betadine. Bladder straight catheterized and draped in sterile fashion.  Timeout was done.  Bivalve speculum was then placed and the anterior cervical lip was injected with 1% lidocaine and grasped with a single tooth tenaculum.  Paracervical block  was placed at the 2, 4, 5, 7, 8 and 10 o'clock positions with approximately 20 mL of the same solution.   Cervix was gently progressively dilated.  Hysteroscope was placed in the endocervical canal and advanced under direct visualization using distending  media.  The above findings were noted.  Hysteroscope was withdrawn and gentle sharp curettage was done productive of tissue.  Reinspection reveals again the findings in the upper endocervical canal.  Sharp curettage was then repeated gently.  A final  inspection with the hysteroscope reveals good distention of the cavity as well as the cavity and endocervical canal are clean.  Instruments were removed.  Good hemostasis was noted.  I and O's of distending media is 55 mL deficit.  All counts were  correct.  The patient is awakened and taken to recovery room in stable condition.   NIK D: 08/27/2022 8:00:19 am T: 08/27/2022 8:21:00 am  JOB: [id number]/ [id number]

## 2022-08-27 NOTE — Progress Notes (Signed)
08/27/2022  7:55 AM  PATIENT:  Jennifer Vincent  32 y.o. female  PRE-OPERATIVE DIAGNOSIS:  ABNORMAL UTERINE BLEEDING  POST-OPERATIVE DIAGNOSIS:  ABNORMAL UTERINE BLEEDING  PROCEDURE:  Procedure(s): DILATATION & CURETTAGE/HYSTEROSCOPY (N/A)  SURGEON:  Surgeon(s) and Role:    * Harold Hedge, MD - Primary  PHYSICIAN ASSISTANT:   ASSISTANTS:   ANESTHESIA:   general  EBL:  30 mL   BLOOD ADMINISTERED:none  DRAINS: none   LOCAL MEDICATIONS USED:  LIDOCAINE  and Amount: 20 ml  SPECIMEN:  Source of Specimen:  endometrial currettings  DISPOSITION OF SPECIMEN:  PATHOLOGY  COUNTS:  YES  TOURNIQUET:  * No tourniquets in log *  DICTATION: .Other Dictation: Dictation Number [id number]  PLAN OF CARE: Discharge to home after PACU  PATIENT DISPOSITION:  PACU - hemodynamically stable.   Delay start of Pharmacological VTE agent (>24hrs) due to surgical blood loss or risk of bleeding: not applicable

## 2022-08-27 NOTE — Transfer of Care (Signed)
Immediate Anesthesia Transfer of Care Note  Patient: Jennifer Vincent  Procedure(s) Performed: Procedure(s) (LRB): DILATATION & CURETTAGE/HYSTEROSCOPY (N/A)  Patient Location: PACU  Anesthesia Type: General  Level of Consciousness: awake, sedated, patient cooperative and responds to stimulation  Airway & Oxygen Therapy: Patient Spontanous Breathing and Patient connected to [place] oxygen  Post-op Assessment: Report given to PACU RN, Post -op Vital signs reviewed and stable and Patient moving all extremities  Post vital signs: Reviewed and stable  Complications: No apparent anesthesia complications

## 2022-08-27 NOTE — Progress Notes (Signed)
No change to H&P per patient history Reviewed procdure- H/S, D&C, possible Myosure. All questions answered. She states she understands and agrees.  D/W PO instructions

## 2022-08-27 NOTE — Anesthesia Postprocedure Evaluation (Signed)
Anesthesia Post Note  Patient: Jennifer Vincent  Procedure(s) Performed: DILATATION & CURETTAGE/HYSTEROSCOPY (Uterus)     Patient location during evaluation: PACU Anesthesia Type: General Level of consciousness: awake and sedated Pain management: pain level controlled Vital Signs Assessment: post-procedure vital signs reviewed and stable Respiratory status: spontaneous breathing Cardiovascular status: stable Postop Assessment: no apparent nausea or vomiting Anesthetic complications: no  No notable events documented.  Last Vitals:  Vitals:   08/27/22 0806 08/27/22 0815  BP: 101/76 100/78  Pulse: 81 84  Resp: 11 15  Temp: 36.6 C   SpO2: 100% 99%    Last Pain:  Vitals:   08/27/22 0806  TempSrc:   PainSc: 0-No pain                 Caren Macadam

## 2022-08-27 NOTE — Anesthesia Procedure Notes (Signed)
Procedure Name: LMA Insertion Date/Time: 08/27/2022 7:27 AM  Performed by: Jessica Priest, CRNAPre-anesthesia Checklist: Patient identified, Emergency Drugs available, Suction available, Patient being monitored and Timeout performed Patient Re-evaluated:Patient Re-evaluated prior to induction Oxygen Delivery Method: Circle system utilized Preoxygenation: Pre-oxygenation with 100% oxygen Induction Type: IV induction Ventilation: Mask ventilation without difficulty LMA: LMA inserted LMA Size: 4.0 Number of attempts: 1 Airway Equipment and Method: Bite block Placement Confirmation: positive ETCO2, breath sounds checked- equal and bilateral and CO2 detector Tube secured with: Tape Dental Injury: Teeth and Oropharynx as per pre-operative assessment

## 2022-08-28 ENCOUNTER — Encounter (HOSPITAL_BASED_OUTPATIENT_CLINIC_OR_DEPARTMENT_OTHER): Payer: Self-pay | Admitting: Obstetrics and Gynecology

## 2022-08-29 ENCOUNTER — Other Ambulatory Visit: Payer: Self-pay | Admitting: Neurology

## 2022-08-29 LAB — SURGICAL PATHOLOGY

## 2022-08-30 ENCOUNTER — Encounter: Payer: BC Managed Care – PPO | Admitting: Internal Medicine

## 2022-09-02 ENCOUNTER — Encounter: Payer: Self-pay | Admitting: Physician Assistant

## 2022-09-02 ENCOUNTER — Other Ambulatory Visit (HOSPITAL_COMMUNITY): Payer: Self-pay

## 2022-09-03 ENCOUNTER — Other Ambulatory Visit: Payer: Self-pay

## 2022-09-03 DIAGNOSIS — E6609 Other obesity due to excess calories: Secondary | ICD-10-CM

## 2022-09-03 MED ORDER — SEMAGLUTIDE-WEIGHT MANAGEMENT 0.25 MG/0.5ML ~~LOC~~ SOAJ
0.2500 mg | Freq: Once | SUBCUTANEOUS | Status: DC
Start: 2022-09-03 — End: 2023-10-08

## 2022-09-03 NOTE — Telephone Encounter (Signed)
Please see patient message and advise.

## 2022-09-04 DIAGNOSIS — N939 Abnormal uterine and vaginal bleeding, unspecified: Secondary | ICD-10-CM | POA: Diagnosis not present

## 2022-09-04 DIAGNOSIS — E860 Dehydration: Secondary | ICD-10-CM | POA: Diagnosis not present

## 2022-09-04 DIAGNOSIS — R6883 Chills (without fever): Secondary | ICD-10-CM | POA: Diagnosis not present

## 2022-09-04 DIAGNOSIS — R0981 Nasal congestion: Secondary | ICD-10-CM | POA: Diagnosis not present

## 2022-09-05 DIAGNOSIS — F4322 Adjustment disorder with anxiety: Secondary | ICD-10-CM | POA: Diagnosis not present

## 2022-09-10 DIAGNOSIS — F4322 Adjustment disorder with anxiety: Secondary | ICD-10-CM | POA: Diagnosis not present

## 2022-09-20 ENCOUNTER — Other Ambulatory Visit: Payer: Self-pay | Admitting: Physician Assistant

## 2022-09-20 DIAGNOSIS — F411 Generalized anxiety disorder: Secondary | ICD-10-CM

## 2022-09-26 ENCOUNTER — Ambulatory Visit: Payer: BC Managed Care – PPO | Admitting: Physician Assistant

## 2022-10-17 DIAGNOSIS — F4322 Adjustment disorder with anxiety: Secondary | ICD-10-CM | POA: Diagnosis not present

## 2022-10-17 DIAGNOSIS — H5212 Myopia, left eye: Secondary | ICD-10-CM | POA: Diagnosis not present

## 2022-10-17 DIAGNOSIS — Z79899 Other long term (current) drug therapy: Secondary | ICD-10-CM | POA: Diagnosis not present

## 2022-10-22 ENCOUNTER — Other Ambulatory Visit: Payer: Self-pay | Admitting: Physician Assistant

## 2022-10-22 DIAGNOSIS — Z79899 Other long term (current) drug therapy: Secondary | ICD-10-CM | POA: Diagnosis not present

## 2022-10-22 DIAGNOSIS — D751 Secondary polycythemia: Secondary | ICD-10-CM

## 2022-10-23 ENCOUNTER — Inpatient Hospital Stay: Payer: BC Managed Care – PPO | Attending: Physician Assistant

## 2022-10-23 ENCOUNTER — Inpatient Hospital Stay: Payer: BC Managed Care – PPO | Admitting: Physician Assistant

## 2022-11-05 NOTE — Telephone Encounter (Signed)
I called [employer] to see if I could get the auth # for our records, spoke with Inetta Fermo. She was able to give me the case # of [id number] as well as the auth ref# of [id number].

## 2022-11-06 ENCOUNTER — Other Ambulatory Visit: Payer: Self-pay | Admitting: Physician Assistant

## 2022-11-19 ENCOUNTER — Other Ambulatory Visit: Payer: Self-pay | Admitting: Physician Assistant

## 2022-11-19 DIAGNOSIS — M359 Systemic involvement of connective tissue, unspecified: Secondary | ICD-10-CM

## 2022-11-19 NOTE — Telephone Encounter (Signed)
Last Fill: 08/22/2022  Eye exam: 10/22/2022 WNL.   Labs: 09/04/2022 MPV 11.6,  08/06/2022 Alk phos is borderline low. Rest of CMP WNL   Next Visit: 01/16/19  Last Visit: 08/06/2022  DX: Autoimmune disease   Current Dose per office note 08/06/2022: Plaquenil 200 mg 1 tablet by mouth twice daily   Okay to refill Plaquenil?

## 2022-11-21 ENCOUNTER — Ambulatory Visit (INDEPENDENT_AMBULATORY_CARE_PROVIDER_SITE_OTHER): Payer: BC Managed Care – PPO | Admitting: Neurology

## 2022-11-21 ENCOUNTER — Encounter: Payer: Self-pay | Admitting: Neurology

## 2022-11-21 DIAGNOSIS — F4322 Adjustment disorder with anxiety: Secondary | ICD-10-CM | POA: Diagnosis not present

## 2022-11-21 DIAGNOSIS — G43711 Chronic migraine without aura, intractable, with status migrainosus: Secondary | ICD-10-CM

## 2022-11-21 NOTE — Telephone Encounter (Signed)
You don't have anywhere to squeeze a 7:30 until 9/19. Is that ok to offer or did you want to look at putting her somewhere else sooner?

## 2022-11-21 NOTE — Progress Notes (Signed)
Consent Form Botulism Toxin Injection For Chronic Migraine   06/19/2023 stable  022/07/2022: Stable doing well on botox and quilipta. Samples botox, spent time writing qulipta/botox request fo rboth insurnce is giving us problems approving both but the combination has been spectacular for her   02/25/2022: Doing well 5 migraine days a month and the [place] and qulipta helps acutely. +a. Next would try Vyepti. May move botox out to every 10 weeks next time. SAMPLES TODAY bill by time  Reviewed orally with patient, additionally signature is on file:  Botulism toxin has been approved by the Federal drug administration for treatment of chronic migraine. Botulism toxin does not cure chronic migraine and it may not be effective in some patients.  The administration of botulism toxin is accomplished by injecting a small amount of toxin into the muscles of the neck and head. Dosage must be titrated for each individual. Any benefits resulting from botulism toxin tend to wear off after 3 months with a repeat injection required if benefit is to be maintained. Injections are usually done every 3-4 months with maximum effect peak achieved by about 2 or 3 weeks. Botulism toxin is expensive and you should be sure of what costs you will incur resulting from the injection.  The side effects of botulism toxin use for chronic migraine may include:   -Transient, and usually mild, facial weakness with facial injections  -Transient, and usually mild, head or neck weakness with head/neck injections  -Reduction or loss of forehead facial animation due to forehead muscle weakness  -Eyelid drooping  -Dry eye  -Pain at the site of injection or bruising at the site of injection  -Double vision  -Potential unknown long term risks   Contraindications: You should not have Botox if you are pregnant, nursing, allergic to albumin, have an infection, skin condition, or muscle weakness at the site of the injection, or  have myasthenia gravis, Lambert-Eaton syndrome, or ALS.  It is also possible that as with any injection, there may be an allergic reaction or no effect from the medication. Reduced effectiveness after repeated injections is sometimes seen and rarely infection at the injection site may occur. All care will be taken to prevent these side effects. If therapy is given over a long time, atrophy and wasting in the muscle injected may occur. Occasionally the patient's become refractory to treatment because they develop antibodies to the toxin. In this event, therapy needs to be modified.  I have read the above information and consent to the administration of botulism toxin.    BOTOX PROCEDURE NOTE FOR MIGRAINE HEADACHE  Contraindications and precautions discussed with patient(above). Aseptic procedure was observed and patient tolerated procedure. Procedure performed by Ihor AustinJessica McCue, NP-BC.   The condition has existed for more than 6 months, and pt does not have a diagnosis of ALS, Myasthenia Gravis or Lambert-Eaton Syndrome.  Risks and benefits of injections discussed and pt agrees to proceed with the procedure.  Written consent obtained  These injections are medically necessary. Pt  receives good benefits from these injections. These injections do not cause sedations or hallucinations which the oral therapies may cause.   Description of procedure:  The patient was placed in a sitting position. The standard protocol was used for Botox as follows, with 5 units of Botox injected at each site:  -Procerus muscle, midline injection  -Corrugator muscle, bilateral injection  -Frontalis muscle, bilateral injection, with 2 sites each side, medial injection was performed in the upper one  third of the frontalis muscle, in the region vertical from the medial inferior edge of the superior orbital rim. The lateral injection was again in the upper one third of the forehead vertically above the lateral limbus of  the cornea, 1.5 cm lateral to the medial injection site.  -Temporalis muscle injection, 4 sites, bilaterally. The first injection was 3 cm above the tragus of the ear, second injection site was 1.5 cm to 3 cm up from the first injection site in line with the tragus of the ear. The third injection site was 1.5-3 cm forward between the first 2 injection sites. The fourth injection site was 1.5 cm posterior to the second injection site. 5th site laterally in the temporalis  muscleat the level of the outer canthus.  -Occipitalis muscle injection, 3 sites, bilaterally. The first injection was done one half way between the occipital protuberance and the tip of the mastoid process behind the ear. The second injection site was done lateral and superior to the first, 1 fingerbreadth from the first injection. The third injection site was 1 fingerbreadth superiorly and medially from the first injection site.  -Cervical paraspinal muscle injection, 2 sites, bilaterally. The first injection site was 1 cm from the midline of the cervical spine, 3 cm inferior to the lower border of the occipital protuberance. The second injection site was 1.5 cm superiorly and laterally to the first injection site.  -Trapezius muscle injection was performed at 3 sites, bilaterally. The first injection site was in the upper trapezius muscle halfway between the inflection point of the neck, and the acromion. The second injection site was one half way between the acromion and the first injection site. The third injection was done between the first injection site and the inflection point of the neck.  -Masseters: 2 sites bilaterally, 5 units each side   Will return for repeat injection in 3 months.   A total of 200 units of Botox was prepared, 45 units wasted Botox not injected was wasted. The patient tolerated the procedure well, there were no complications of the above procedure.

## 2022-11-22 ENCOUNTER — Inpatient Hospital Stay (HOSPITAL_BASED_OUTPATIENT_CLINIC_OR_DEPARTMENT_OTHER): Payer: BC Managed Care – PPO | Admitting: Physician Assistant

## 2022-11-22 ENCOUNTER — Inpatient Hospital Stay: Payer: BC Managed Care – PPO | Attending: Physician Assistant

## 2022-11-22 VITALS — BP 131/91 | HR 105 | Temp 97.9°F | Resp 17 | Wt 190.0 lb

## 2022-11-22 DIAGNOSIS — D751 Secondary polycythemia: Secondary | ICD-10-CM

## 2022-11-22 DIAGNOSIS — R61 Generalized hyperhidrosis: Secondary | ICD-10-CM | POA: Diagnosis not present

## 2022-11-22 DIAGNOSIS — Z862 Personal history of diseases of the blood and blood-forming organs and certain disorders involving the immune mechanism: Secondary | ICD-10-CM | POA: Diagnosis not present

## 2022-11-22 LAB — CMP (CANCER CENTER ONLY)
ALT: 18 U/L (ref 0–44)
AST: 19 U/L (ref 15–41)
Albumin: 4.4 g/dL (ref 3.5–5.0)
Alkaline Phosphatase: 22 U/L — ABNORMAL LOW (ref 38–126)
Anion gap: 7 (ref 5–15)
BUN: 11 mg/dL (ref 6–20)
CO2: 26 mmol/L (ref 22–32)
Calcium: 9.2 mg/dL (ref 8.9–10.3)
Chloride: 105 mmol/L (ref 98–111)
Creatinine: 0.87 mg/dL (ref 0.44–1.00)
GFR, Estimated: >60 mL/min (ref >60)
Glucose, Bld: 89 mg/dL (ref 70–99)
Potassium: 3.8 mmol/L (ref 3.5–5.1)
Sodium: 138 mmol/L (ref 135–145)
Total Bilirubin: 0.4 mg/dL (ref 0.3–1.2)
Total Protein: 7.5 g/dL (ref 6.5–8.1)

## 2022-11-22 LAB — CBC WITH DIFFERENTIAL (CANCER CENTER ONLY)
Abs Immature Granulocytes: 0.01 10*3/uL (ref 0.00–0.07)
Basophils Absolute: 0.1 10*3/uL (ref 0.0–0.1)
Basophils Relative: 1 %
Eosinophils Absolute: 0.1 10*3/uL (ref 0.0–0.5)
Eosinophils Relative: 1 %
HCT: 42.9 % (ref 36.0–46.0)
Hemoglobin: 14.6 g/dL (ref 12.0–15.0)
Immature Granulocytes: 0 %
Lymphocytes Relative: 31 %
Lymphs Abs: 2.3 10*3/uL (ref 0.7–4.0)
MCH: 31.5 pg (ref 26.0–34.0)
MCHC: 34 g/dL (ref 30.0–36.0)
MCV: 92.7 fL (ref 80.0–100.0)
Monocytes Absolute: 0.4 10*3/uL (ref 0.1–1.0)
Monocytes Relative: 5 %
Neutro Abs: 4.5 10*3/uL (ref 1.7–7.7)
Neutrophils Relative %: 62 %
Platelet Count: 256 10*3/uL (ref 150–400)
RBC: 4.63 MIL/uL (ref 3.87–5.11)
RDW: 12.3 % (ref 11.5–15.5)
WBC Count: 7.2 10*3/uL (ref 4.0–10.5)
nRBC: 0 % (ref 0.0–0.2)

## 2022-11-22 NOTE — Progress Notes (Signed)
[facility] [phone]   [phone]  PROGRESS NOTE  Patient Care Team: Allwardt, Crist Infante, PA-C as PCP - General (Physician Assistant) Ailene Ards, MD as Referring Physician (Obstetrics and Gynecology) Konrad Penta, MD as Referring Physician (Gastroenterology)  Hematological/Oncological History 01/30/2022: WBC 4.9, Hgb 15.7 (H), Hct 45.9% (H), Plt 247 02/10/2022: WBC 6.0, Hgb 16.3 (H), Hct 47.4% (H), Plt 267 02/19/2022: WBC 6.1, Hgb 14.5, Hct 41.8%, Plt 235 04/01/2022: WBC 6.0, Hgb 15.0 (H), Hct 46.4% (H), Plt 274 04/23/2022: Establish care with [facility] Hematology  CHIEF COMPLAINTS/PURPOSE OF CONSULTATION:  "Polycythemia, night sweats"  HISTORY OF PRESENTING ILLNESS:  Jennifer Vincent 32 y.o. female returns for a follow up for polycythemia. She is unaccompanied for this visit.   On exam today, Mrs. Niwa reports her energy levels are fairly stable. She is able to complete all her ADLs on  her own. She has lost approximately 12 lbs since June 2024 which was intentional. She reports occasional night sweats that does disrupt her sleep. She denies fevers, chills, shortness of breath, chest pain or cough.  She has no other complaints. Rest of the 10 point ROS is below.   MEDICAL HISTORY:  Past Medical History:  Diagnosis Date   Abnormal uterine bleeding (AUB)    Allergic rhinitis    Anxiety    Autoimmune disorder (HCC)    nonspecified   COVID 04/05/2020   Frequent migraine    IBS (irritable bowel syndrome)    Migraine    PCOS (polycystic ovarian syndrome)     SURGICAL HISTORY: Past Surgical History:  Procedure Laterality Date   DILATATION & CURETTAGE/HYSTEROSCOPY WITH MYOSURE N/A 08/27/2022   Procedure: DILATATION & CURETTAGE/HYSTEROSCOPY;  Surgeon: Harold Hedge, MD;  Location: [facility];  Service: Gynecology;  Laterality: N/A;   TONSILLECTOMY  2011    SOCIAL HISTORY: Social History   Socioeconomic History   Marital  status: Married    Spouse name: Not on file   Number of children: 1   Years of education: Not on file   Highest education level: Master's degree (e.g., MA, MS, MEng, MEd, MSW, MBA)  Occupational History   Not on file  Tobacco Use   Smoking status: Never    Passive exposure: Never   Smokeless tobacco: Never  Vaping Use   Vaping status: Never Used  Substance and Sexual Activity   Alcohol use: Not Currently   Drug use: Never   Sexual activity: Yes    Birth control/protection: None  Other Topics Concern   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      Home Situation: lives with husband and daughter      Spiritual Beliefs: Jewish      Lifestyle:  regular, aerobic and orange theory; diet healthy       Update: uses peloton 5x a week      Right handed      Caffeine: 1 cup/day   Social Determinants of Health   Financial Resource Strain: Low Risk  (05/04/2021)   Overall Financial Resource Strain (CARDIA)    Difficulty of Paying Living Expenses: Not hard at all  Food Insecurity: No Food Insecurity (04/23/2022)   Hunger Vital Sign    Worried About Running Out of Food in the Last Year: Never true    Ran Out of Food in the Last Year: Never true  Transportation Needs: No Transportation Needs (04/23/2022)   PRAPARE - Transportation  Lack of Transportation (Medical): No    Lack of Transportation (Non-Medical): No  Physical Activity: Insufficiently Active (05/04/2021)   Exercise Vital Sign    Days of Exercise per Week: 4 days    Minutes of Exercise per Session: 30 min  Stress: No Stress Concern Present (05/04/2021)   [employer] of Occupational Health - Occupational Stress Questionnaire    Feeling of Stress : Only a little  Social Connections: Socially Integrated (05/04/2021)   Social Connection and Isolation Panel [NHANES]    Frequency of Communication with Friends and Family: More than three times a week    Frequency of Social  Gatherings with Friends and Family: More than three times a week    Attends Religious Services: 1 to 4 times per year    Active Member of [employer] or Organizations: Yes    Attends Engineer, structural: More than 4 times per year    Marital Status: Married  Catering manager Violence: Not At Risk (04/23/2022)   Humiliation, Afraid, Rape, and Kick questionnaire    Fear of Current or Ex-Partner: No    Emotionally Abused: No    Physically Abused: No    Sexually Abused: No    FAMILY HISTORY: Family History  Problem Relation Age of Onset   Depression Mother    Eating disorder Mother    Arthritis Father    Crohn's disease Father    Ulcerative colitis Father    Arthritis Brother    Alcohol abuse Brother    Mental illness Brother    Depression Brother    Anxiety disorder Brother    Crohn's disease Brother    Migraines Maternal Uncle    Breast cancer Maternal Grandmother    Mental illness Maternal Grandfather    Anxiety disorder Maternal Grandfather    OCD Maternal Grandfather    Heart disease Paternal Grandfather    Diabetes Paternal Grandfather    Heart attack Paternal Grandfather    Healthy Daughter    Healthy Son     ALLERGIES:  is allergic to depakote [divalproex sodium], lactose, and lactose intolerance (gi).  MEDICATIONS:  Current Outpatient Medications  Medication Sig Dispense Refill   ALPRAZolam (XANAX) 0.25 MG tablet TAKE 1 TABLET(0.25 MG) BY MOUTH TWICE DAILY AS NEEDED FOR ANXIETY 20 tablet 2   Atogepant (QULIPTA) 60 MG TABS Take 1 tablet (60 mg total) by mouth daily. (Patient taking differently: Take 60 mg by mouth every evening.) 60 tablet 0   busPIRone (BUSPAR) 15 MG tablet TAKE 2 TABLETS(30 MG) BY MOUTH TWICE DAILY 360 tablet 0   hydroxychloroquine (PLAQUENIL) 200 MG tablet TAKE 1 TABLET(200 MG) BY MOUTH TWICE DAILY 180 tablet 0   hydrOXYzine (ATARAX) 25 MG tablet TAKE 1 TABLET(25 MG) BY MOUTH AT BEDTIME AS NEEDED FOR ANXIETY 15 tablet 0   JUNEL FE 1/20 1-20  MG-MCG tablet Take 1 tablet by mouth every evening.     ondansetron (ZOFRAN) 4 MG tablet TAKE 1 TABLET(4 MG) BY MOUTH EVERY 8 HOURS AS NEEDED FOR NAUSEA OR VOMITING 20 tablet 0   sertraline (ZOLOFT) 100 MG tablet TAKE 1 TABLET(100 MG) BY MOUTH DAILY (Patient taking differently: every evening.) 90 tablet 2   Ubrogepant (UBRELVY) 100 MG TABS TAKE 1 TABLET BY MOUTH AT ONSET OF MIGRAINE. MAY TAKE ANOTHER 2 HOUR LATER AS NEEDED 16 tablet 5   LINZESS 290 MCG CAPS capsule TAKE ONE CAPSULE BY MOUTH DAILY BEFORE BREAKFAST (Patient not taking: Reported on 11/22/2022) 90 capsule 0   Current Facility-Administered Medications  Medication Dose Route Frequency Provider Last Rate Last Admin   botulinum toxin Type A (BOTOX) injection 155 Units  155 Units Intramuscular Once Anson Fret, MD       botulinum toxin Type A (BOTOX) injection 155 Units  155 Units Intramuscular Once Anson Fret, MD       Semaglutide-Weight Management SOAJ 0.25 mg  0.25 mg Subcutaneous Once Allwardt, Alyssa M, PA-C        REVIEW OF SYSTEMS:   Constitutional: ( - ) fevers, ( - )  chills , ( +) night sweats Eyes: ( - ) blurriness of vision, ( - ) double vision, ( - ) watery eyes Ears, nose, mouth, throat, and face: ( - ) mucositis, ( - ) sore throat Respiratory: ( - ) cough, ( - ) dyspnea, ( - ) wheezes Cardiovascular: ( - ) palpitation, ( - ) chest discomfort, ( - ) lower extremity swelling Gastrointestinal:  ( -) nausea, ( - ) heartburn, ( - ) change in bowel habits Skin: ( - ) abnormal skin rashes Lymphatics: ( - ) new lymphadenopathy, ( - ) easy bruising Neurological: ( - ) numbness, ( - ) tingling, ( - ) new weaknesses Behavioral/Psych: ( - ) mood change, ( - ) new changes  All other systems were reviewed with the patient and are negative.  PHYSICAL EXAMINATION: ECOG PERFORMANCE STATUS: 1 - Symptomatic but completely ambulatory  Vitals:   11/22/22 1504  BP: (!) 131/91  Pulse: (!) 105  Resp: 17  Temp: 97.9 F (36.6  C)  SpO2: 100%    Filed Weights   11/22/22 1504  Weight: 190 lb (86.2 kg)     GENERAL: well appearing female in NAD  SKIN: skin color, texture, turgor are normal, no rashes or significant lesions EYES: conjunctiva are pink and non-injected, sclera clear  LUNGS: clear to auscultation and percussion with normal breathing effort HEART: regular rate & rhythm and no murmurs and no lower extremity edema Musculoskeletal: no cyanosis of digits and no clubbing  PSYCH: alert & oriented x 3, fluent speech NEURO: no focal motor/sensory deficits  LABORATORY DATA:  I have reviewed the data as listed    Latest Ref Rng & Units 11/22/2022    2:37 PM 08/27/2022    6:00 AM 07/17/2022    1:07 PM  CBC  WBC 4.0 - 10.5 K/uL 7.2  4.9  6.6   Hemoglobin 12.0 - 15.0 g/dL 82.9  56.2  13.0   Hematocrit 36.0 - 46.0 % 42.9  41.1  43.7   Platelets 150 - 400 K/uL 256  226  247.0        Latest Ref Rng & Units 08/09/2022    8:22 AM 08/06/2022    3:24 PM 04/23/2022    2:53 PM  CMP  Glucose 65 - 99 mg/dL  89  88   BUN 7 - 25 mg/dL  12  9   Creatinine 8.65 - 0.97 mg/dL  7.84  6.96   Sodium 295 - 146 mmol/L  140  139   Potassium 3.5 - 5.3 mmol/L  4.3  3.8   Chloride 98 - 110 mmol/L  105  105   CO2 20 - 32 mmol/L  26  28   Calcium 8.6 - 10.2 mg/dL  9.6  9.4   Total Protein 6.1 - 8.1 g/dL 6.9  6.9  7.1   Total Bilirubin 0.2 - 1.2 mg/dL  0.4  0.5   Alkaline Phos 38 - 126 U/L  24   AST 10 - 30 U/L  17  19   ALT 6 - 29 U/L  16  22      ASSESSMENT & PLAN Jennifer Vincent is a 32 y.o. female returns for a follow up for polycythemia accompanied with night sweats.    #Polycythemia--resolved: --patient is a non smoker and does not use any testosterone containing products --MPN panel and BCR/ABL FISH was negative on 04/23/2022.  --Labs today show Hgb is back to normal with unremarkable CBC.  --Did discuss sleep study in the past but patient will hold further testing at this time since Hgb levels are back to  normal  #Night sweats: --Etiology unknown but discussed differentials including medication induced, hormone changes, or an  inflammatory process  --Advised to monitor and follow up with PCP.   Follow up: --RTC as needed  No orders of the defined types were placed in this encounter.   All questions were answered. The patient knows to call the clinic with any problems, questions or concerns.  I have spent a total of 25 minutes minutes of face-to-face and non-face-to-face time, preparing to see the patient,  performing a medically appropriate examination, counseling and educating the patient,  documenting clinical information in the electronic health record, and care coordination.   Georga Kaufmann, PA-C Department of Hematology/Oncology [facility] Phone: [phone]

## 2022-11-26 ENCOUNTER — Telehealth: Payer: Self-pay | Admitting: Neurology

## 2022-11-26 ENCOUNTER — Ambulatory Visit (INDEPENDENT_AMBULATORY_CARE_PROVIDER_SITE_OTHER): Payer: BC Managed Care – PPO | Admitting: Adult Health

## 2022-11-26 DIAGNOSIS — G43711 Chronic migraine without aura, intractable, with status migrainosus: Secondary | ICD-10-CM

## 2022-11-26 MED ORDER — ONABOTULINUMTOXINA 200 UNITS IJ SOLR
155.0000 [IU] | Freq: Once | INTRAMUSCULAR | Status: DC
Start: 2022-11-26 — End: 2022-11-26

## 2022-11-26 MED ORDER — ONABOTULINUMTOXINA 200 UNITS IJ SOLR
155.0000 [IU] | Freq: Once | INTRAMUSCULAR | Status: AC
Start: 2022-11-26 — End: 2022-11-26
  Administered 2022-11-26: 155 [IU] via INTRAMUSCULAR

## 2022-11-26 MED ORDER — KETOROLAC TROMETHAMINE 60 MG/2ML IM SOLN
60.0000 mg | Freq: Once | INTRAMUSCULAR | Status: AC
Start: 2022-11-26 — End: 2022-11-26
  Administered 2022-11-26: 60 mg via INTRAMUSCULAR

## 2022-11-26 NOTE — Telephone Encounter (Signed)
Pt saw Aundra Millet today for Botox and at checkout mentioned that she usually does every 8 weeks instead of 12. Will we be using samples in 8 weeks or should she stick to 12 with Megan?

## 2022-11-26 NOTE — Progress Notes (Unsigned)
11/27/22: reports that botox has worked well. Only had 1 migraine in the last month. Dr. Lucia Gaskins has been doing botox every 8 weeks. See has also been getting toradol after botox to prevent a migraine from coming after injections. Did not inject masseters today.    BOTOX PROCEDURE NOTE FOR MIGRAINE HEADACHE    Contraindications and precautions discussed with patient(above). Aseptic procedure was observed and patient tolerated procedure. Procedure performed by Butch Penny, NP  The condition has existed for more than 6 months, and pt does not have a diagnosis of ALS, Myasthenia Gravis or Lambert-Eaton Syndrome.  Risks and benefits of injections discussed and pt agrees to proceed with the procedure.  Written consent obtained  These injections do not cause sedations or hallucinations which the oral therapies may cause.  Indication/Diagnosis: chronic migraine BOTOX(J0585) injection was performed according to protocol by [employer]. 200 units of BOTOX was dissolved into 4 cc NS.   NDC: [address]-40  Type of toxin: Botox  Botox- 200 units x 1 vial Lot: [id number] Expiration: 02/2025 NDC: 8295-6213-08   Bacteriostatic 0.9% Sodium Chloride- 4mL total [id number] Expiration: 06/17/23 NDC: 9528-4132-44   Dx: W10.272     Description of procedure:  The patient was placed in a sitting position. The standard protocol was used for Botox as follows, with 5 units of Botox injected at each site:   -Procerus muscle, midline injection  -Corrugator muscle, bilateral injection  -Frontalis muscle, bilateral injection, with 2 sites each side, medial injection was performed in the upper one third of the frontalis muscle, in the region vertical from the medial inferior edge of the superior orbital rim. The lateral injection was again in the upper one third of the forehead vertically above the lateral limbus of the cornea, 1.5 cm lateral to the medial injection site.  -Temporalis muscle injection, 4  sites, bilaterally. The first injection was 3 cm above the tragus of the ear, second injection site was 1.5 cm to 3 cm up from the first injection site in line with the tragus of the ear. The third injection site was 1.5-3 cm forward between the first 2 injection sites. The fourth injection site was 1.5 cm posterior to the second injection site.  -Occipitalis muscle injection, 3 sites, bilaterally. The first injection was done one half way between the occipital protuberance and the tip of the mastoid process behind the ear. The second injection site was done lateral and superior to the first, 1 fingerbreadth from the first injection. The third injection site was 1 fingerbreadth superiorly and medially from the first injection site.  -Cervical paraspinal muscle injection, 2 sites, bilateral knee first injection site was 1 cm from the midline of the cervical spine, 3 cm inferior to the lower border of the occipital protuberance. The second injection site was 1.5 cm superiorly and laterally to the first injection site.  -Trapezius muscle injection was performed at 3 sites, bilaterally. The first injection site was in the upper trapezius muscle halfway between the inflection point of the neck, and the acromion. The second injection site was one half way between the acromion and the first injection site. The third injection was done between the first injection site and the inflection point of the neck.   Will return for repeat injection in 3 months.   A 200 unit sof Botox was used, 155 units were injected, the rest of the Botox was wasted. The patient tolerated the procedure well, there were no complications of the above procedure.  Jennifer Vincent  Ethelene Browns, MSN, NP-C 11/26/2022, 3:52 PM [facility] [address], [address] [place], [place] [address] [phone]

## 2022-11-26 NOTE — Progress Notes (Unsigned)
Botox- 200 units x 1 vial Lot: [id number] Expiration: 02/2025 NDC: 4010-2725-36  Bacteriostatic 0.9% Sodium Chloride- 4mL total [id number] Expiration: 06/17/23 NDC: 5956-3875-64  Dx: G43.711 BB  Witnessed by: Alveria Apley

## 2022-11-26 NOTE — Progress Notes (Unsigned)
Toradol 60 mg IM given in RUOQ of R buttock per v.o. Megan NP. See MAR.

## 2022-11-27 NOTE — Patient Instructions (Signed)
She could not make appointment

## 2022-11-27 NOTE — Telephone Encounter (Signed)
Apologize to her that was just temporary bc I believe she was having a lot of migraines and now she is improved,  I don;t have the samples to support that but I wish I did. Thank you. Every 12 weeks.

## 2022-12-03 DIAGNOSIS — F4322 Adjustment disorder with anxiety: Secondary | ICD-10-CM | POA: Diagnosis not present

## 2022-12-16 DIAGNOSIS — J029 Acute pharyngitis, unspecified: Secondary | ICD-10-CM | POA: Diagnosis not present

## 2022-12-16 DIAGNOSIS — Z20822 Contact with and (suspected) exposure to covid-19: Secondary | ICD-10-CM | POA: Diagnosis not present

## 2022-12-17 ENCOUNTER — Other Ambulatory Visit: Payer: Self-pay | Admitting: Physician Assistant

## 2022-12-17 DIAGNOSIS — F411 Generalized anxiety disorder: Secondary | ICD-10-CM

## 2022-12-22 IMAGING — US US OB COMP LESS 14 WK
1 series · 12 of 28 positions shown · non-contrast
Comparison: none

[Series 1: us ob comp less 14 wk · 65 acquisitions, 12 frames shown]
[im 3/65]
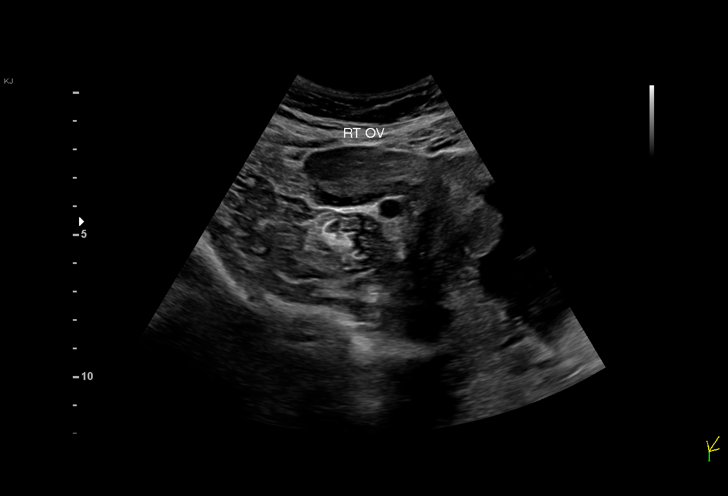
[im 8/65]
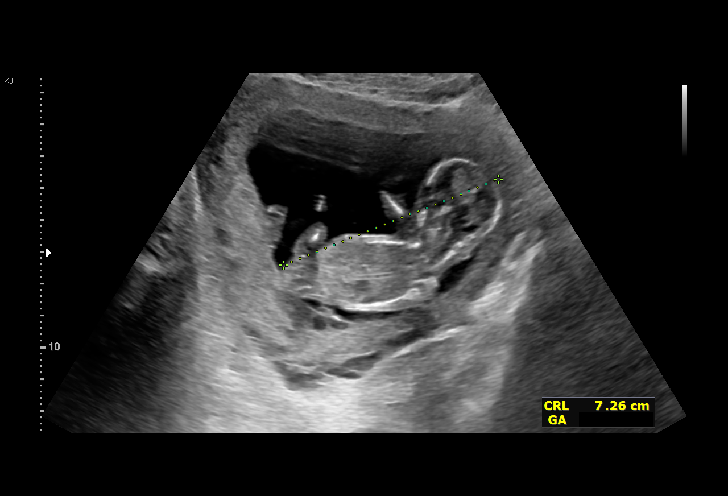
[im 12/65]
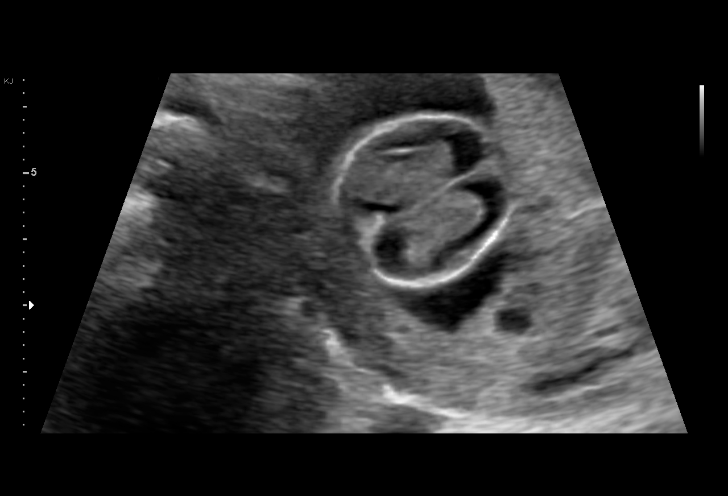
[im 19/65]
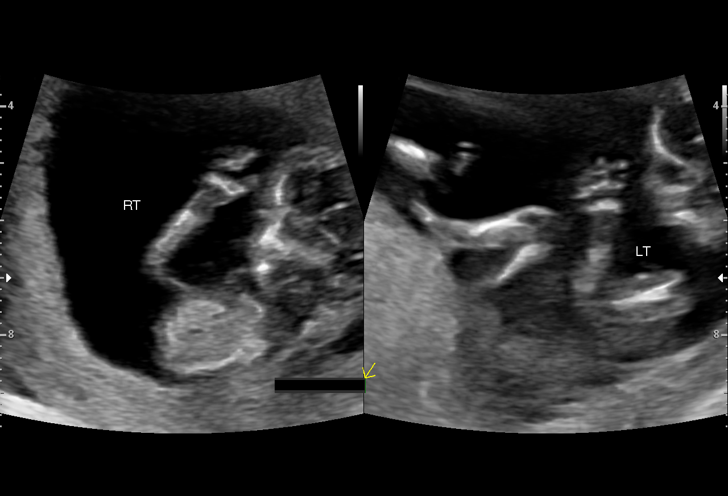
[im 24/65]
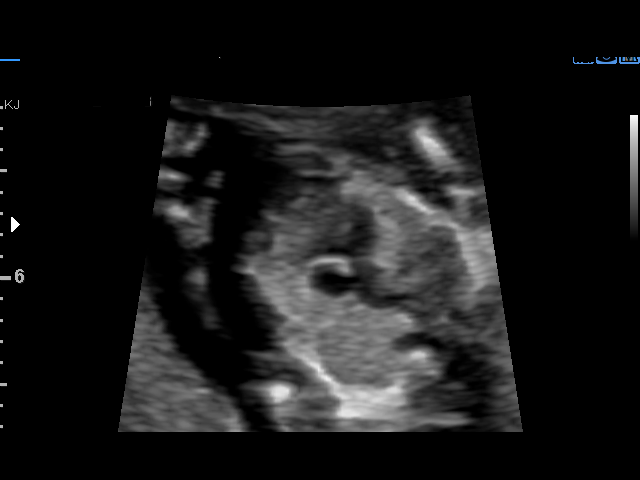
[im 29/65]
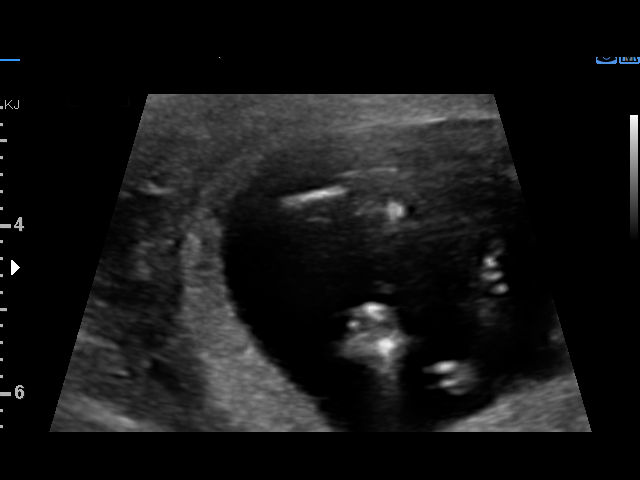
[im 36/65]
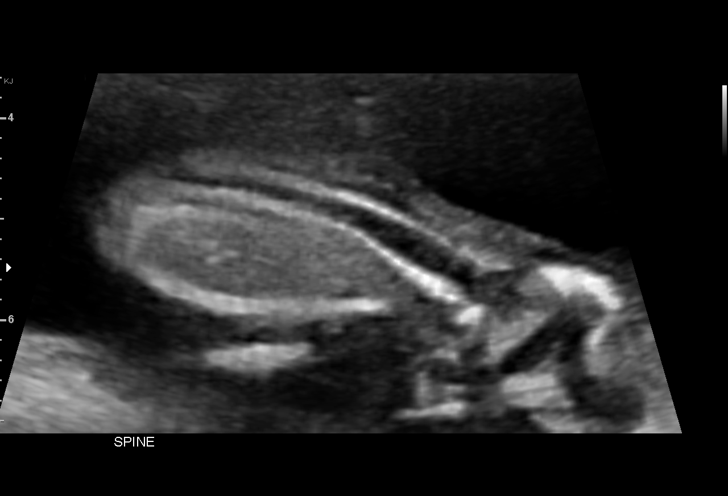
[im 41/65]
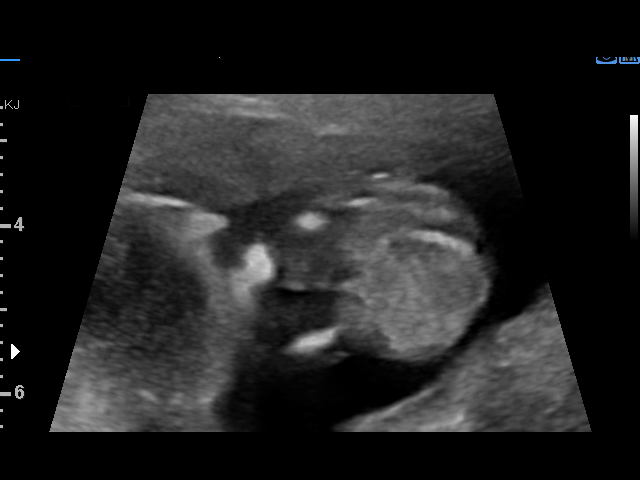
[im 46/65]
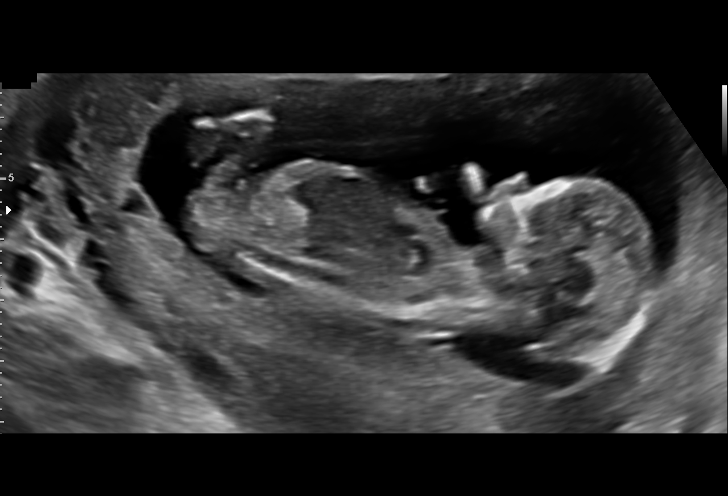
[im 53/65]
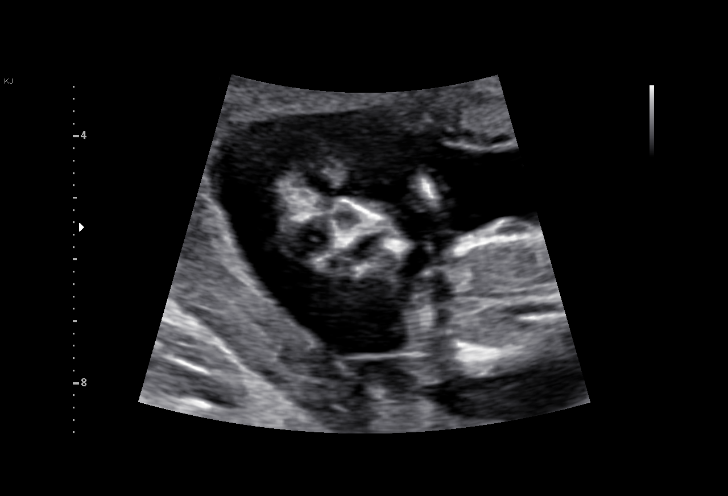
[im 57/65]
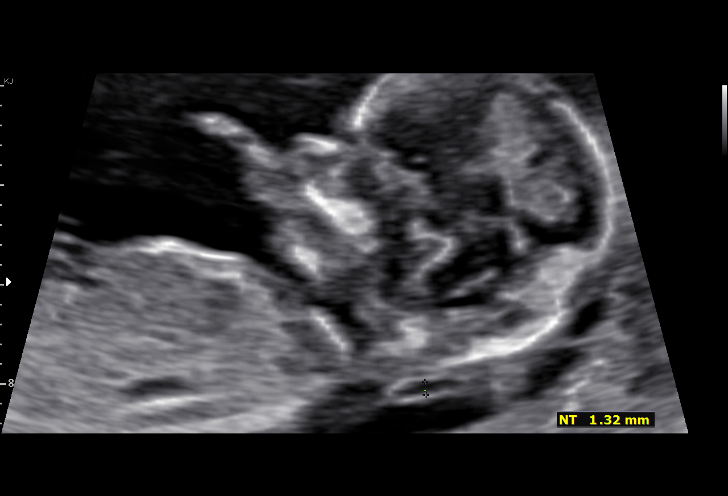
[im 62/65]
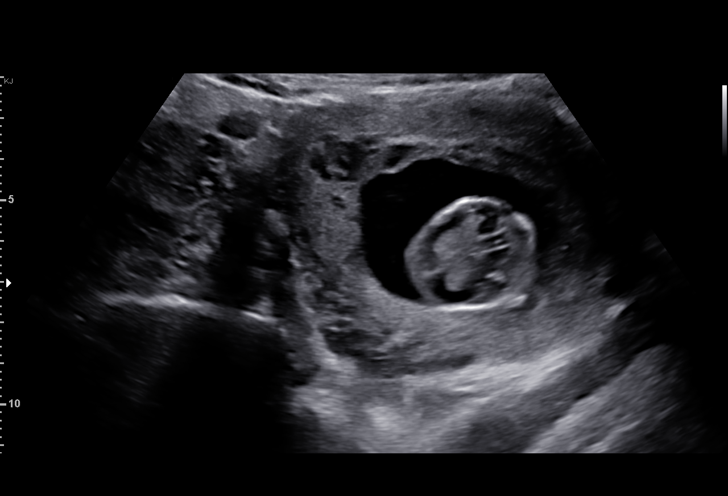

[12 of 28 positions shown; findings below may reference images not displayed]

ANATOMY

Indications

 Anticardiolipin antibody affecting pregnancy,
 antepartum
 + RAMBO
 14 weeks gestation of pregnancy
Fetal Evaluation

 Num Of Fetuses:         1
 Fetal Heart Rate(bpm):  150
 Cardiac Activity:       Observed
 Presentation:           Variable
 Placenta:               Anterior
 P. Cord Insertion:      Not well visualized

 Amniotic Fluid
 AFI FV:      Within normal limits
Biometry

 CRL:      73.8  mm     G. Age:  13w 2d                  EDD:   10/21/20
 NT:       1.63  mm
OB History

 Gravidity:    2         Term:   1        Prem:   0        SAB:   0
 TOP:          0       Ectopic:  0        Living: 1
Gestational Age

 Clinical EDD:  14w 0d                                        EDD:   10/16/20
 Best:          14w 0d     Det. By:  Clinical EDD             EDD:   10/16/20
Anatomy

 Cranium:               Visualized             Abdomen:                Visualized
 Choroid Plexus:        Visualized             Abdominal Wall:         Visualized
 Heart:                 Visualized             Kidneys:                Visualized
 RVOT:                  Visualized             Bladder:                Visualized
 Aortic Arch:           Visualized             Spine:                  Visualized
 Diaphragm:             Visualized             Upper Extremities:      Visualized
 Stomach:               Visualized             Lower Extremities:      Visualized

 Other:  3VV visualized
Cervix Uterus Adnexa

 Cervix
 Normal appearance by transabdominal scan.

 Uterus
 No abnormality visualized.

 Right Ovary
 Appears Normal

 Left Ovary
 Appears Normal
Comments

 Rtoyota Joshjax was seen in consultation today due to a
 nonspecific autoimmune disorder.  She reports that prior to
 her first pregnancy at around 1346, she started to experience
 joint pain in her arms, hands, and knees.  The symptoms
 worsened after her delivery and she was referred to a
 rheumatologist.  Her rheumatologist then started her on
 Plaquenil and her symptoms subsequently resolved.  She
 reports that she has screened positive twice  for the IgG
 anticardiolipin antibodies.  She denies any history of a prior
 thromboembolic event.  This is her second pregnancy.  Her
 first pregnancy resulted in a vaginal delivery of a full-term
 appropriately grown neonate.  She did not experience any
 complications following delivery.  She denies any history of
 miscarriages, stillbirths, or preeclampsia.  She has also
 screened positive for the antinuclear antibodies.

 She reports that she is recovering from a recent IY7IK-1G
 infection.  She has been fully vaccinated against IY7IK-1G
 including the booster vaccine shot.

 Her past medical history includes anxiety and migraine
 headaches.

 Her past surgical history includes a tonsillectomy.

 She reports that she had a cell free DNA test drawn about 3
 days ago.  The results of that test are currently pending.

 On today's ultrasound exam, a viable singleton intrauterine
 pregnancy was noted.  The crown-rump length measured
 today is consistent with her gestational age giving her an
 EDC October 16, 2020.

 During our consultation today, the following were discussed:

 Nonspecific autoimmune disease and pregnancy

 The patient currently does not meet the criteria for a
 diagnosis of lupus.  She was reassured that women with
 nonspecific autoimmune diseases often will have a normal
 pregnancy outcome.  However, the increased risk of adverse
 pregnancy outcomes such as the development of
 preeclampsia, an indicated preterm birth, and fetal growth
 restriction was discussed.

 She should continue close follow-up with her rheumatologist
 throughout her pregnancy.

 She should continue taking Plaquenil as recommended by
 her rheumatologist.  She reports that she has an eye exam
 scheduled next week.

 The patient has screened negative for the LAT and SSB
 antibodies in January 2019.  Therefore, her baby should
 not be at risk for neonatal lupus or a congenital heart block.

 We will continue to follow her closely with monthly growth
 ultrasounds.  Weekly fetal testing should be started at around
 32 weeks.

 Delivery should probably occur at around 39 weeks or earlier
 should there be any complications.

 Positive IgG anticardiolipin antibodies

 As the patient does not have a history of a prior
 thromboembolic event or an adverse pregnancy outcome
 (recurrent miscarriages, IUFD, or early onset severe
 preeclampsia), she does not meet the criteria for the
 antiphospholipid antibody syndrome.  Therefore,
 anticoagulation prophylaxis with Lovenox is not
 recommended during her pregnancy.

 The increased risk that she may develop a thromboembolic
 event or have an adverse pregnancy outcome due to these
 antibodies was discussed.  It may be reasonable for her to
 continue taking a daily baby aspirin throughout her pregnancy
 as it will not do any harm.

 As the risk for a thromboembolic event is highest during the
 postpartum period especially if she is delivered via C-section,
 anticoagulation prophylaxis (with Lovenox) may be
 considered for 6 weeks postpartum should she be delivered
 via C-section.

 Recent IY7IK-1G infection and pregnancy

 As the patient has already been fully vaccinated and has
 received the booster shot, she was advised to continue with
 the routine preventative measures for IY7IK-1G
 transmission.

 The small association of a IY7IK-1G infection in pregnancy
 with stillbirths was discussed.  As she will already be
 receiving weekly fetal testing due to her nonspecific
 autoimmune disorder starting at 32 weeks, we will hopefully
 be able to modify her risk for a stillbirth.

 A detailed fetal anatomy scan has been scheduled in our
 office at around 19 weeks.

 At the end of the consultation, the patient stated that all of her
 questions had been answered to her complete satisfaction.
 We will continue to follow her closely with you throughout this
 pregnancy.

 Thank you for referring this patient for a Maternal-Fetal
 Medicine consultation.
 Total time spent in consultation: 45 minutes.
Recommendations

 Continue Plaquenil and daily baby aspirin throughout
 pregnancy
 No need for anticoagulation prophylaxis during pregnancy
 Detailed fetal anatomy scan
 Monthly growth ultrasounds
 Weekly fetal testing starting at 32 weeks
 Delivery at around 39 weeks
 Consider anticoagulation prophylaxis for 6 weeks postpartum
 should she be delivered via C-section
 Continue to follow-up with her rheumatologist during
 pregnancy

## 2022-12-27 NOTE — Progress Notes (Unsigned)
Office Visit Note  Patient: Jennifer Vincent             Date of Birth: 05/03/1990           MRN: [id number]             PCP: Jennifer Leriche, PA-C Referring: Vincent, Jennifer Infante, PA-C Visit Date: 01/09/2023 Occupation: @GUAROCC @  Subjective:  No chief complaint on file.   History of Present Illness: Jennifer Vincent is a 32 y.o. female with history of autoimmune disease.  Patient remains on Plaquenil 200 mg 1 tablet by mouth twice daily.   PLQ eye exam: 10/22/2022 WNL. [facility]. Follow up in 1 year.  CBC and CMP updated on 11/22/22.    Lab work from 08/06/22 was reviewed today in the office: ESR WNL, dsDNA negative, complements WNL, vitamin D WNL, and protein creatinine ratio WNL.    Activities of Daily Living:  Patient reports morning stiffness for *** {minute/hour:19697}.   Patient {ACTIONS;DENIES/REPORTS:21021675::"Denies"} nocturnal pain.  Difficulty dressing/grooming: {ACTIONS;DENIES/REPORTS:21021675::"Denies"} Difficulty climbing stairs: {ACTIONS;DENIES/REPORTS:21021675::"Denies"} Difficulty getting out of chair: {ACTIONS;DENIES/REPORTS:21021675::"Denies"} Difficulty using hands for taps, buttons, cutlery, and/or writing: {ACTIONS;DENIES/REPORTS:21021675::"Denies"}  No Rheumatology ROS completed.   PMFS History:  Patient Active Problem List   Diagnosis Date Noted   History of migraine 07/17/2022   Class 1 obesity due to excess calories without serious comorbidity with body mass index (BMI) of 32.0 to 32.9 in adult 07/17/2022   Family history of inflammatory bowel disease 07/17/2022   Unexplained night sweats 04/01/2022   Pain in pelvis 03/06/2022   Antiphospholipid antibody syndrome (HCC) 10/02/2020   Chronic migraine without aura, with intractable migraine, so stated, with status migrainosus 06/01/2019   Generalized anxiety disorder 01/11/2018   Panic disorder 10/04/2016   Anxiety and depression 10/04/2016    Past Medical History:  Diagnosis Date   Abnormal uterine  bleeding (AUB)    Allergic rhinitis    Anxiety    Autoimmune disorder (HCC)    nonspecified   COVID 04/05/2020   Frequent migraine    IBS (irritable bowel syndrome)    Migraine    PCOS (polycystic ovarian syndrome)     Family History  Problem Relation Age of Onset   Depression Mother    Eating disorder Mother    Arthritis Father    Crohn's disease Father    Ulcerative colitis Father    Arthritis Brother    Alcohol abuse Brother    Mental illness Brother    Depression Brother    Anxiety disorder Brother    Crohn's disease Brother    Migraines Maternal Uncle    Breast cancer Maternal Grandmother    Mental illness Maternal Grandfather    Anxiety disorder Maternal Grandfather    OCD Maternal Grandfather    Heart disease Paternal Grandfather    Diabetes Paternal Grandfather    Heart attack Paternal Grandfather    Healthy Daughter    Healthy Son    Past Surgical History:  Procedure Laterality Date   DILATATION & CURETTAGE/HYSTEROSCOPY WITH MYOSURE N/A 08/27/2022   Procedure: DILATATION & CURETTAGE/HYSTEROSCOPY;  Surgeon: Jennifer Hedge, MD;  Location: [facility];  Service: Gynecology;  Laterality: N/A;   TONSILLECTOMY  2011   Social History   Social History Narrative   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   Physical Exam Vitals and nursing note reviewed.   Constitutional:      Appearance: She is well-developed.  HENT:     Head: Normocephalic and atraumatic.  Eyes:     Conjunctiva/sclera: Conjunctivae normal.  Cardiovascular:     Rate and Rhythm: Normal rate and regular rhythm.     Heart sounds: Normal heart sounds.  Pulmonary:     Effort: Pulmonary effort is normal.     Breath sounds: Normal breath sounds.  Abdominal:     General: Bowel sounds are normal.     Palpations: Abdomen is soft.  Musculoskeletal:     Cervical back: Normal range of motion.  Lymphadenopathy:     Cervical: No cervical adenopathy.  Skin:    General: Skin is warm and dry.     Capillary Refill: Capillary refill takes less than 2 seconds.  Neurological:     Mental Status: She is alert and oriented to person, place, and time.  Psychiatric:        Behavior: Behavior normal.      Musculoskeletal Exam: ***  CDAI Exam: CDAI Score: -- Patient Global: --; Provider Global: -- Swollen: --; Tender: -- Joint Exam 01/09/2023   No joint exam has been documented for this visit   There is currently no information documented on the homunculus. Go to the Rheumatology activity and complete the homunculus joint exam.  Investigation: No additional findings.  Imaging: No results found.  Recent Labs: Lab Results  Component Value Date   WBC 7.2 11/22/2022   HGB 14.6 11/22/2022   PLT 256 11/22/2022   NA 138 11/22/2022   K 3.8 11/22/2022   CL 105 11/22/2022   CO2 26 11/22/2022   GLUCOSE 89 11/22/2022   BUN 11 11/22/2022   CREATININE 0.87 11/22/2022   BILITOT 0.4 11/22/2022   ALKPHOS 22 (L) 11/22/2022   AST 19 11/22/2022   ALT 18 11/22/2022   PROT 7.5 11/22/2022   ALBUMIN 4.4 11/22/2022   CALCIUM 9.2 11/22/2022   GFRAA 119 07/29/2019   QFTBGOLDPLUS NEGATIVE 07/29/2019    Speciality Comments: PLQ eye exam: 10/22/2022 WNL. [facility]. Follow up in 1 year.  Procedures:  No procedures performed Allergies: Depakote [divalproex sodium], Lactose, and  Lactose intolerance (gi)   Assessment / Plan:     Visit Diagnoses: Autoimmune disease (HCC)  High risk medication use  Anticardiolipin antibody positive  Pain in both hands  Pain of both elbows  Chondromalacia of both patellae  Vitamin D deficiency  Low serum alkaline phosphatase  Other fatigue  Generalized anxiety disorder  Panic disorder  History of migraine  History of COVID-19  History of shingles  History of IBS  Family history of ulcerative colitis  Family history of Crohn's disease  Orders: No orders of the defined types were placed in this encounter.  No orders of the defined types were placed in this encounter.   Face-to-face time spent with patient was *** minutes. Greater than 50% of time was spent in counseling and coordination of care.  Follow-Up  Instructions: No follow-ups on file.   Gearldine Bienenstock, PA-C  Note - This record has been created using Dragon software.  Chart creation errors have been sought, but may not always  have been located. Such creation errors do not reflect on  the standard of medical care.

## 2022-12-29 ENCOUNTER — Other Ambulatory Visit: Payer: Self-pay | Admitting: Adult Health

## 2023-01-09 ENCOUNTER — Ambulatory Visit: Payer: BC Managed Care – PPO | Attending: Rheumatology | Admitting: Physician Assistant

## 2023-01-09 ENCOUNTER — Encounter: Payer: Self-pay | Admitting: Physician Assistant

## 2023-01-09 VITALS — BP 105/73 | HR 91 | Resp 14 | Ht 68.0 in | Wt 194.0 lb

## 2023-01-09 DIAGNOSIS — Z8616 Personal history of COVID-19: Secondary | ICD-10-CM

## 2023-01-09 DIAGNOSIS — R760 Raised antibody titer: Secondary | ICD-10-CM | POA: Diagnosis not present

## 2023-01-09 DIAGNOSIS — L659 Nonscarring hair loss, unspecified: Secondary | ICD-10-CM | POA: Diagnosis not present

## 2023-01-09 DIAGNOSIS — M359 Systemic involvement of connective tissue, unspecified: Secondary | ICD-10-CM

## 2023-01-09 DIAGNOSIS — M79641 Pain in right hand: Secondary | ICD-10-CM | POA: Diagnosis not present

## 2023-01-09 DIAGNOSIS — F411 Generalized anxiety disorder: Secondary | ICD-10-CM

## 2023-01-09 DIAGNOSIS — Z8719 Personal history of other diseases of the digestive system: Secondary | ICD-10-CM

## 2023-01-09 DIAGNOSIS — M2242 Chondromalacia patellae, left knee: Secondary | ICD-10-CM

## 2023-01-09 DIAGNOSIS — R5383 Other fatigue: Secondary | ICD-10-CM

## 2023-01-09 DIAGNOSIS — Z79899 Other long term (current) drug therapy: Secondary | ICD-10-CM | POA: Diagnosis not present

## 2023-01-09 DIAGNOSIS — F4322 Adjustment disorder with anxiety: Secondary | ICD-10-CM | POA: Diagnosis not present

## 2023-01-09 DIAGNOSIS — M2241 Chondromalacia patellae, right knee: Secondary | ICD-10-CM

## 2023-01-09 DIAGNOSIS — R140 Abdominal distension (gaseous): Secondary | ICD-10-CM

## 2023-01-09 DIAGNOSIS — M25521 Pain in right elbow: Secondary | ICD-10-CM

## 2023-01-09 DIAGNOSIS — Z8619 Personal history of other infectious and parasitic diseases: Secondary | ICD-10-CM

## 2023-01-09 DIAGNOSIS — Z8379 Family history of other diseases of the digestive system: Secondary | ICD-10-CM

## 2023-01-09 DIAGNOSIS — R748 Abnormal levels of other serum enzymes: Secondary | ICD-10-CM

## 2023-01-09 DIAGNOSIS — M79642 Pain in left hand: Secondary | ICD-10-CM

## 2023-01-09 DIAGNOSIS — I7300 Raynaud's syndrome without gangrene: Secondary | ICD-10-CM

## 2023-01-09 DIAGNOSIS — E559 Vitamin D deficiency, unspecified: Secondary | ICD-10-CM

## 2023-01-09 DIAGNOSIS — Z8669 Personal history of other diseases of the nervous system and sense organs: Secondary | ICD-10-CM

## 2023-01-09 DIAGNOSIS — M25522 Pain in left elbow: Secondary | ICD-10-CM

## 2023-01-09 DIAGNOSIS — F410 Panic disorder [episodic paroxysmal anxiety] without agoraphobia: Secondary | ICD-10-CM

## 2023-01-09 NOTE — Patient Instructions (Signed)
Raynaud's Phenomenon  Raynaud's phenomenon is a condition that affects the blood vessels (arteries) that carry blood to the fingers and toes. The arteries that supply blood to the ears, lips, nipples, or the tip of the nose might also be affected. Raynaud's phenomenon causes the arteries to become narrow temporarily (spasm). As a result, the flow of blood to the affected areas is temporarily decreased. This usually occurs in response to cold temperatures or stress. During an attack, the skin in the affected areas turns white, then blue, and finally red. A person may also feel tingling or numbness in those areas. Attacks usually last for only a brief period, and then the blood flow to the area returns to normal. In most cases, Raynaud's phenomenon does not cause serious health problems. What are the causes? In many cases, the cause of this condition is not known. The condition may occur on its own (primary Raynaud's phenomenon) or may be associated with other diseases or factors (secondary Raynaud's phenomenon). Possible causes may include: Diseases or medical conditions that damage the arteries. Injuries and repetitive actions that hurt the hands or feet. Being exposed to certain chemicals. Taking medicines that narrow the arteries. Other medical conditions, such as lupus, scleroderma, rheumatoid arthritis, thyroid problems, blood disorders, Sjogren syndrome, or atherosclerosis. What increases the risk? The following factors may make you more likely to develop this condition: Being 39-73 years old. Being female. Having a family history of Raynaud's phenomenon. Living in a cold climate. Smoking. What are the signs or symptoms? Symptoms of this condition usually occur when you are exposed to cold temperatures or when you have emotional stress. The symptoms may last for a few minutes or up to several hours. They usually affect your fingers but may also affect your toes, nipples, lips, ears, or the  tip of your nose. Symptoms may include: Changes in skin color. The skin in the affected areas will turn pale or white. The skin may then change from white to bluish to red as normal blood flow returns to the area. Numbness, tingling, or pain in the affected areas. In severe cases, symptoms may include: Skin sores. Tissues decaying and dying (gangrene). How is this diagnosed? This condition may be diagnosed based on: Your symptoms and medical history. A physical exam. During the exam, you may be asked to put your hands in cold water to check for a reaction to cold temperature. Tests, such as: Blood tests to check for other diseases or conditions. A test to check the movement of blood through your arteries and veins (vascular ultrasound). A test in which the skin at the base of your fingernail is examined under a microscope (nailfold capillaroscopy). How is this treated? During an episode, you can take actions to help symptoms go away faster. Options include moving your arms around in a windmill pattern, warming your fingers under warm water, or placing your fingers in a warm body fold, such as your armpit. Long-term treatment for this condition often involves making lifestyle changes and taking steps to control your exposure to cold temperature. For more severe cases, medicine (calcium channel blockers) may be used to improve blood circulation. Follow these instructions at home: Avoiding cold temperatures Take these steps to avoid exposure to cold: If possible, stay indoors during cold weather. When you go outside during cold weather, dress in layers and wear mittens, a hat, a scarf, and warm footwear. Wear mittens or gloves when handling ice or frozen food. Use holders for glasses or cans containing  cold drinks. Let warm water run for a while before taking a shower or bath. Warm up the car before driving in cold weather. Lifestyle If possible, avoid stressful and emotional situations. Try  to find ways to manage your stress, such as: Exercise. Yoga. Meditation. Biofeedback. Do not use any products that contain nicotine or tobacco. These products include cigarettes, chewing tobacco, and vaping devices, such as e-cigarettes. If you need help quitting, ask your health care provider. Avoid secondhand smoke. Limit your use of caffeine. Switch to decaffeinated coffee, tea, and soda. Avoid chocolate. Avoid vibrating tools and machinery. General instructions Protect your hands and feet from injuries, cuts, or bruises. Avoid wearing tight rings or wristbands. Wear loose fitting socks and comfortable, roomy shoes. Take over-the-counter and prescription medicines only as told by your health care provider. Where to find support Raynaud's Association: www.raynauds.org Where to find more information [employer] of Arthritis and Musculoskeletal and Skin Diseases: www.niams.http://www.myers.net/ Contact a health care provider if: Your discomfort becomes worse despite lifestyle changes. You develop sores on your fingers or toes that do not heal. You have breaks in the skin on your fingers or toes. You have a fever. You have pain or swelling in your joints. You have a rash. Your symptoms occur on only one side of your body. Get help right away if: Your fingers or toes turn black. You have severe pain in the affected areas. These symptoms may represent a serious problem that is an emergency. Do not wait to see if the symptoms will go away. Get medical help right away. Call your local emergency services (911 in the [place] Do not drive yourself to the hospital. Summary Raynaud's phenomenon is a condition that affects the arteries that carry blood to the fingers, toes, ears, lips, nipples, or the tip of the nose. In many cases, the cause of this condition is not known. Symptoms of this condition include changes in skin color along with numbness and tingling in the affected area. Treatment for  this condition includes lifestyle changes and reducing exposure to cold temperatures. Medicines may be used for severe cases of the condition. Contact your health care provider if your condition worsens despite treatment. This information is not intended to replace advice given to you by your health care provider. Make sure you discuss any questions you have with your health care provider. Document Revised: 05/09/2020 Document Reviewed: 05/09/2020 Elsevier Patient Education  2024 [employer].

## 2023-01-11 LAB — COMPLETE METABOLIC PANEL WITH GFR
AG Ratio: 1.4 (calc) (ref 1.0–2.5)
ALT: 19 U/L (ref 6–29)
AST: 22 U/L (ref 10–30)
Albumin: 4.2 g/dL (ref 3.6–5.1)
Alkaline phosphatase (APISO): 30 U/L — ABNORMAL LOW (ref 31–125)
BUN: 11 mg/dL (ref 7–25)
CO2: 27 mmol/L (ref 20–32)
Calcium: 9.8 mg/dL (ref 8.6–10.2)
Chloride: 104 mmol/L (ref 98–110)
Creat: 0.83 mg/dL (ref 0.50–0.97)
Globulin: 3.1 g/dL (ref 1.9–3.7)
Glucose, Bld: 77 mg/dL (ref 65–99)
Potassium: 4.9 mmol/L (ref 3.5–5.3)
Sodium: 140 mmol/L (ref 135–146)
Total Bilirubin: 0.6 mg/dL (ref 0.2–1.2)
Total Protein: 7.3 g/dL (ref 6.1–8.1)
eGFR: 95 mL/min/{1.73_m2} (ref >=60)

## 2023-01-11 LAB — PROTEIN / CREATININE RATIO, URINE
Creatinine, Urine: 71 mg/dL (ref 20–275)
Protein/Creat Ratio: 85 mg/g{creat} (ref 24–184)
Protein/Creatinine Ratio: 0.085 mg/mg{creat} (ref 0.024–0.184)
Total Protein, Urine: 6 mg/dL (ref 5–24)

## 2023-01-11 LAB — CBC WITH DIFFERENTIAL/PLATELET
Absolute Lymphocytes: 2122 {cells}/uL (ref 850–3900)
Absolute Monocytes: 319 {cells}/uL (ref 200–950)
Basophils Absolute: 50 {cells}/uL (ref 0–200)
Basophils Relative: 0.9 %
Eosinophils Absolute: 50 {cells}/uL (ref 15–500)
Eosinophils Relative: 0.9 %
HCT: 45.6 % — ABNORMAL HIGH (ref 35.0–45.0)
Hemoglobin: 15.1 g/dL (ref 11.7–15.5)
MCH: 31.3 pg (ref 27.0–33.0)
MCHC: 33.1 g/dL (ref 32.0–36.0)
MCV: 94.6 fL (ref 80.0–100.0)
MPV: 12.5 fL (ref 7.5–12.5)
Monocytes Relative: 5.7 %
Neutro Abs: 3058 {cells}/uL (ref 1500–7800)
Neutrophils Relative %: 54.6 %
Platelets: 259 10*3/uL (ref 140–400)
RBC: 4.82 10*6/uL (ref 3.80–5.10)
RDW: 11.8 % (ref 11.0–15.0)
Total Lymphocyte: 37.9 %
WBC: 5.6 10*3/uL (ref 3.8–10.8)

## 2023-01-11 LAB — IRON,TIBC AND FERRITIN PANEL
%SAT: 39 % (ref 16–45)
Ferritin: 19 ng/mL (ref 16–154)
Iron: 154 ug/dL (ref 40–190)
TIBC: 394 ug/dL (ref 250–450)

## 2023-01-11 LAB — C3 AND C4
C3 Complement: 133 mg/dL (ref 83–193)
C4 Complement: 24 mg/dL (ref 15–57)

## 2023-01-11 LAB — ANA: Anti Nuclear Antibody (ANA): POSITIVE — AB

## 2023-01-11 LAB — ANTI-DNA ANTIBODY, DOUBLE-STRANDED: ds DNA Ab: 2 [IU]/mL

## 2023-01-11 LAB — ANTI-NUCLEAR AB-TITER (ANA TITER): ANA Titer 1: 1:80 {titer} — ABNORMAL HIGH

## 2023-01-11 LAB — SEDIMENTATION RATE: Sed Rate: 2 mm/h (ref 0–20)

## 2023-01-11 LAB — TISSUE TRANSGLUTAMINASE, IGA: (tTG) Ab, IgA: <1 U/mL

## 2023-01-11 LAB — GLIADIN ANTIBODIES, SERUM
Gliadin IgA: <1 U/mL
Gliadin IgG: <1 U/mL

## 2023-01-11 LAB — VITAMIN D 25 HYDROXY (VIT D DEFICIENCY, FRACTURES): Vit D, 25-Hydroxy: 32 ng/mL (ref 30–100)

## 2023-01-11 LAB — TSH: TSH: 0.98 m[IU]/L

## 2023-01-13 NOTE — Progress Notes (Signed)
ANA is positive. Complement WNL  ESR WNL  dsDNA is negative   Labs are not consistent with a flare.  Protein creatinine ratio WNL  CBC WNL Alk phos remains low- rest of CMP WNL.  We will continue to monitor.  tTG is negative. Gliadin antibodies negative  Vitamin D WNL TSH WNL  Iron panel WNL

## 2023-01-15 DIAGNOSIS — F4322 Adjustment disorder with anxiety: Secondary | ICD-10-CM | POA: Diagnosis not present

## 2023-01-16 ENCOUNTER — Ambulatory Visit: Payer: BC Managed Care – PPO | Admitting: Rheumatology

## 2023-01-26 IMAGING — US US MFM OB DETAIL+14 WK
1 series · 13 of 28 positions shown · non-contrast
Comparison: none

[Series 1: us mfm ob detail+14 wk · 147 acquisitions, 13 frames shown]
[im 6/147]
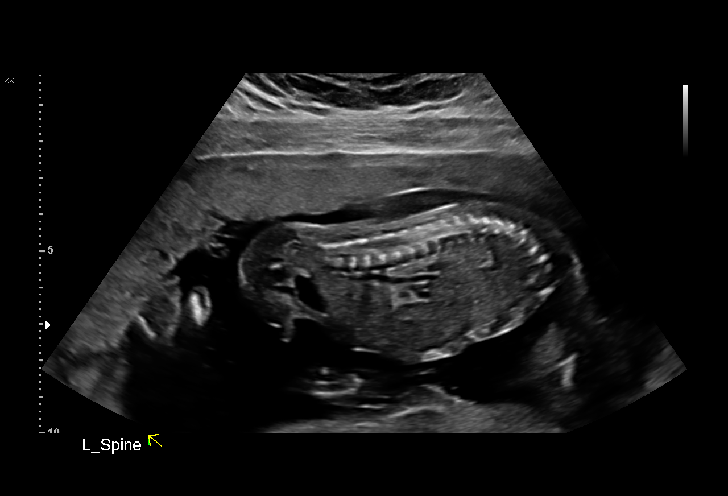
[im 17/147]
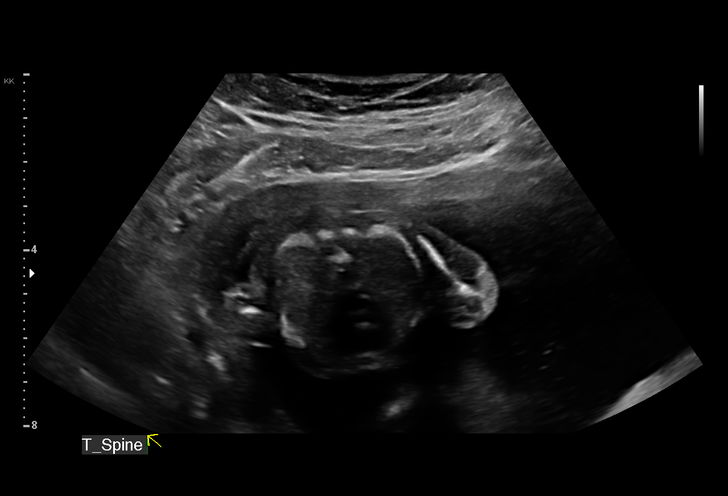
[im 28/147]
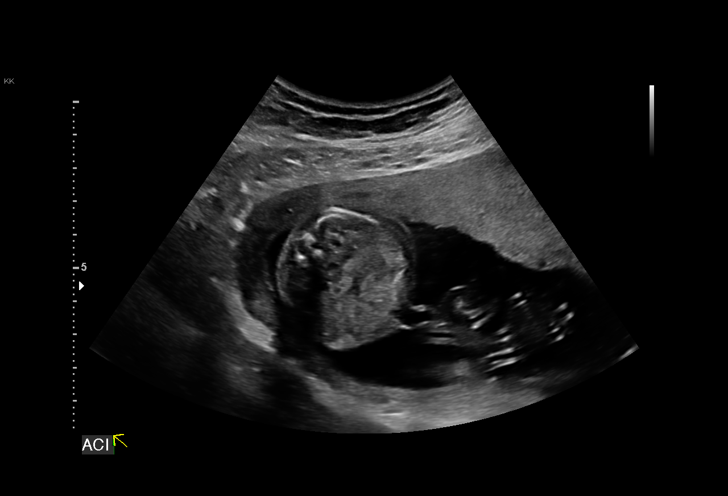
[im 38/147]
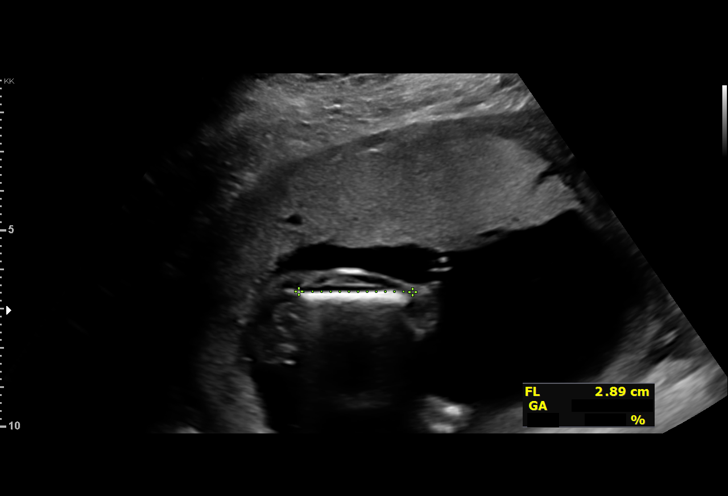
[im 49/147]
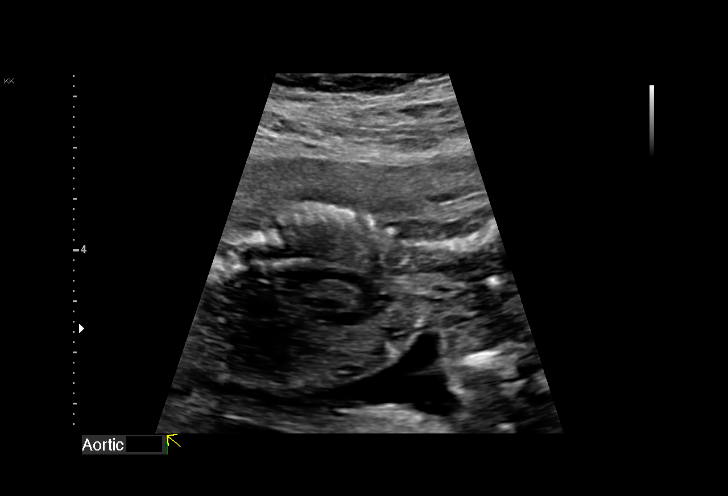
[im 60/147]
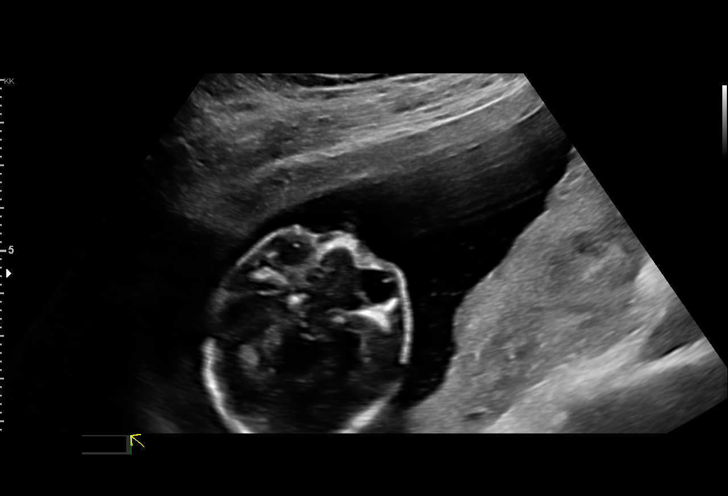
[im 76/147]
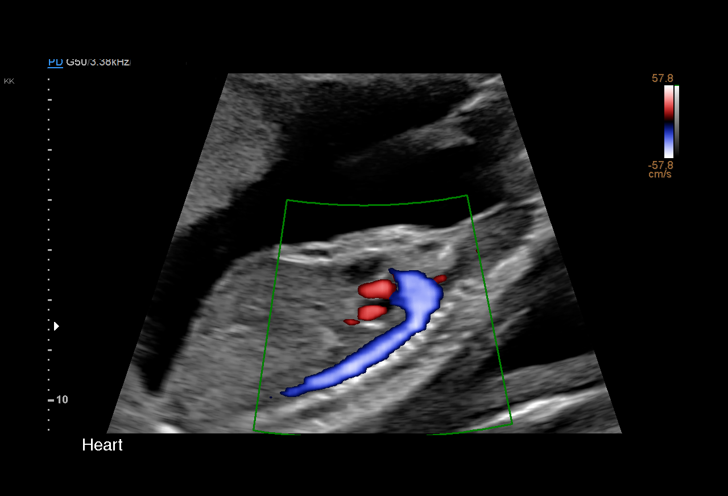
[im 87/147]
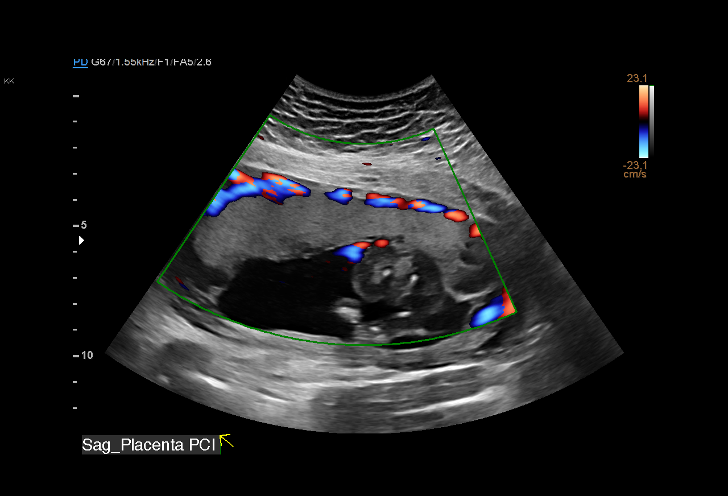
[im 98/147]
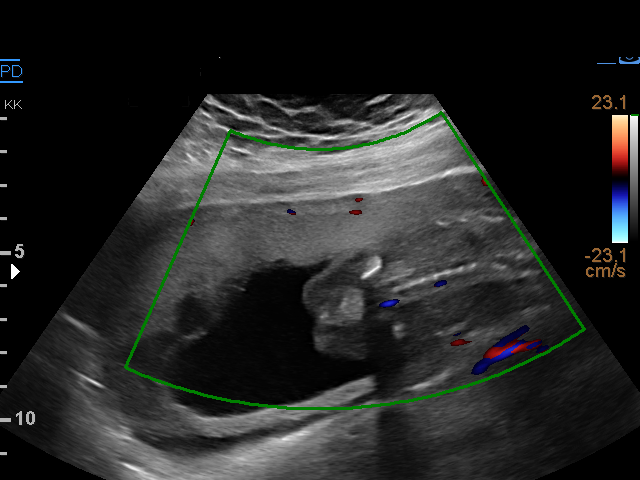
[im 109/147]
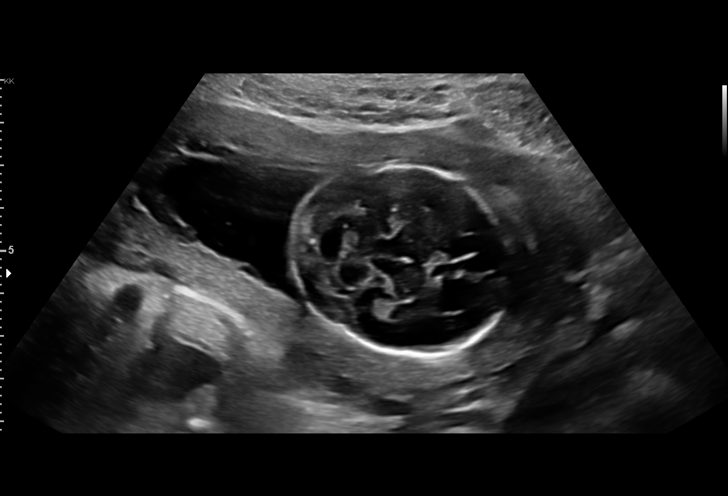
[im 119/147]
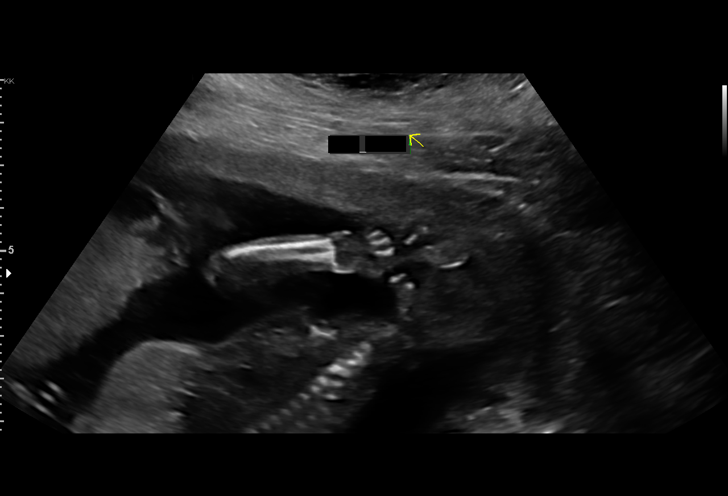
[im 130/147]
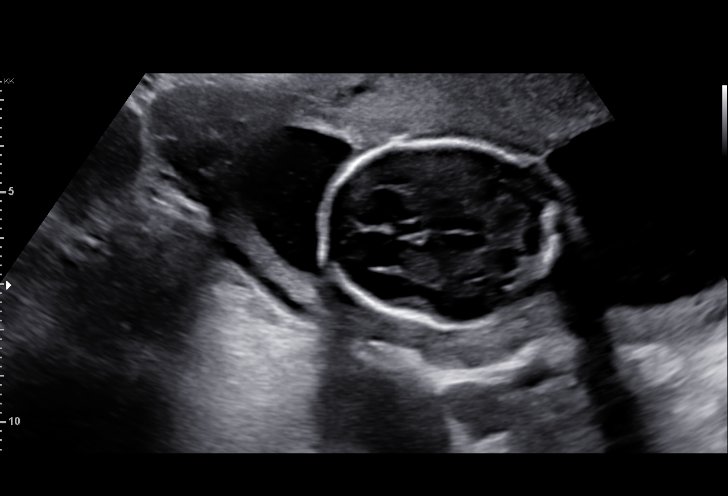
[im 141/147]
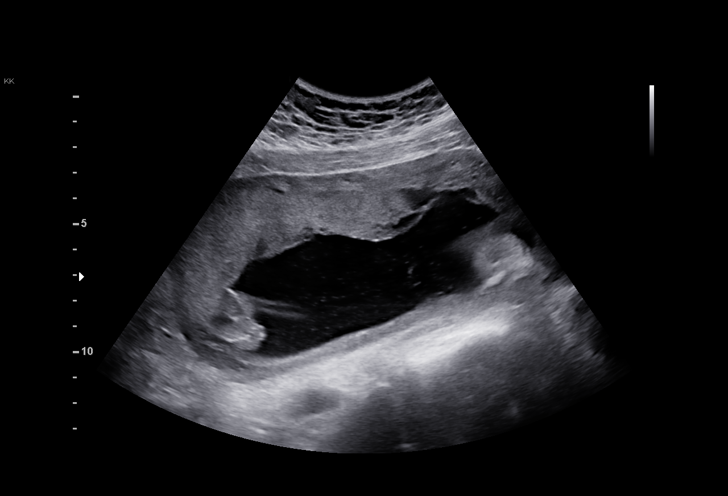

[13 of 28 positions shown; findings below may reference images not displayed]

Indications

 Anticardiolipin antibody affecting pregnancy,
 antepartum
 Obesity complicating pregnancy, second
 trimester (Pregravid BMI 32)
 + ZEINAB
 Encounter for antenatal screening for
 malformations
 18 weeks gestation of pregnancy
Vital Signs

 BMI:
Fetal Evaluation

 Num Of Fetuses:         1
 Fetal Heart Rate(bpm):  145
 Cardiac Activity:       Observed
 Presentation:           Cephalic
 Placenta:               Anterior
 P. Cord Insertion:      Visualized

 Amniotic Fluid
 AFI FV:      Within normal limits

                             Largest Pocket(cm)

Biometry

 BPD:      43.3  mm     G. Age:  19w 1d         61  %    CI:        73.96   %    70 - 86
                                                         FL/HC:      18.1   %    16.1 -
 HC:      159.9  mm     G. Age:  18w 6d         41  %    HC/AC:      1.13        1.09 -
 AC:      142.1  mm     G. Age:  19w 4d         70  %    FL/BPD:     67.0   %
 FL:         29  mm     G. Age:  18w 6d         46  %    FL/AC:      20.4   %    20 - 24
 CER:      18.7  mm     G. Age:  18w 3d         16  %
 NFT:       4.4  mm

 LV:        7.3  mm
 CM:        3.9  mm

 Est. FW:     280  gm    0 lb 10 oz      67  %
OB History

 Gravidity:    2         Term:   1        Prem:   0        SAB:   0
 TOP:          0       Ectopic:  0        Living: 1
Gestational Age

 LMP:           18w 6d        Date:  01/11/20                 EDD:   10/17/20
 U/S Today:     19w 1d                                        EDD:   10/15/20
 Best:          18w 6d     Det. By:  LMP  (01/11/20)          EDD:   10/17/20
Anatomy

 Cranium:               Appears normal         Aortic Arch:            Appears normal
 Cavum:                 Appears normal         Ductal Arch:            Appears normal
 Ventricles:            Appears normal         Diaphragm:              Appears normal
 Choroid Plexus:        Appears normal         Stomach:                Appears normal, left
                                                                       sided
 Cerebellum:            Appears normal         Abdomen:                Appears normal
 Posterior Fossa:       Appears normal         Abdominal Wall:         Appears nml (cord
                                                                       insert, abd wall)
 Nuchal Fold:           Appears normal         Cord Vessels:           Appears normal (3
                                                                       vessel cord)
 Face:                  Appears normal         Kidneys:                Appear normal
                        (orbits and profile)
 Lips:                  Appears normal         Bladder:                Appears normal
 Thoracic:              Appears normal         Spine:                  Appears normal
 Heart:                 Not well visualized    Upper Extremities:      Appears normal
 RVOT:                  Appears normal         Lower Extremities:      Appears normal
 LVOT:                  Appears normal

 Other:  Fetus appears to be a male. Heels visualized. Open hands visualized.
Cervix Uterus Adnexa

 Cervix
 Length:           3.45  cm.
 Normal appearance by transabdominal scan.
Comments

 This patient was seen for a detailed fetal anatomy scan due
 to a nonspecific autoimmune disease.  She has also
 screened positive for the IgG anticardiolipin antibodies
 without a history of a prior thromboembolic event.  She is
 treated with Plaquenil.  She denies any problems since her
 last exam.
 She had a cell free DNA test earlier in her pregnancy which
 indicated a low risk for trisomy 21, 18, and 13. A male fetus is
 predicted.
 She was informed that the fetal growth and amniotic fluid
 level were appropriate for her gestational age.
 There were no obvious fetal anomalies noted on today's
 ultrasound exam.
 The patient was informed that anomalies may be missed due
 to technical limitations. If the fetus is in a suboptimal position
 or maternal habitus is increased, visualization of the fetus in
 the maternal uterus may be impaired.
 Due to her history of a nonspecific autoimmune disease, we
 will continue to follow her with growth ultrasounds throughout
 her pregnancy.
 A follow-up exam was scheduled in 4 weeks to assess the
 fetal growth and to complete the views of the fetal heart.

## 2023-01-29 ENCOUNTER — Encounter: Payer: Self-pay | Admitting: Psychiatry

## 2023-01-29 DIAGNOSIS — Z01419 Encounter for gynecological examination (general) (routine) without abnormal findings: Secondary | ICD-10-CM | POA: Diagnosis not present

## 2023-01-29 DIAGNOSIS — Z6830 Body mass index (BMI) 30.0-30.9, adult: Secondary | ICD-10-CM | POA: Diagnosis not present

## 2023-01-29 DIAGNOSIS — M359 Systemic involvement of connective tissue, unspecified: Secondary | ICD-10-CM | POA: Insufficient documentation

## 2023-01-30 DIAGNOSIS — L650 Telogen effluvium: Secondary | ICD-10-CM | POA: Diagnosis not present

## 2023-01-30 DIAGNOSIS — L658 Other specified nonscarring hair loss: Secondary | ICD-10-CM | POA: Diagnosis not present

## 2023-02-16 ENCOUNTER — Other Ambulatory Visit: Payer: Self-pay | Admitting: Physician Assistant

## 2023-02-16 DIAGNOSIS — M359 Systemic involvement of connective tissue, unspecified: Secondary | ICD-10-CM

## 2023-02-17 NOTE — Telephone Encounter (Signed)
Last Fill: 11/19/2022  Eye exam: 10/22/2022 WNL.    Labs: 01/09/2023 CBC WNL Alk phos remains low- rest of CMP WNL.  Next Visit: 06/09/2023  Last Visit: 01/09/2023  DX:Autoimmune disease   Current Dose per office note 01/09/2023: Plaquenil 200 mg 1 tablet by mouth twice daily.   Okay to refill Plaquenil?

## 2023-02-18 ENCOUNTER — Ambulatory Visit (INDEPENDENT_AMBULATORY_CARE_PROVIDER_SITE_OTHER): Payer: BC Managed Care – PPO | Admitting: Adult Health

## 2023-02-18 DIAGNOSIS — G43711 Chronic migraine without aura, intractable, with status migrainosus: Secondary | ICD-10-CM

## 2023-02-18 MED ORDER — KETOROLAC TROMETHAMINE 60 MG/2ML IM SOLN
60.0000 mg | Freq: Once | INTRAMUSCULAR | Status: AC
  Administered 2023-02-18: 60 mg via INTRAMUSCULAR

## 2023-02-18 MED ORDER — ONABOTULINUMTOXINA 200 UNITS IJ SOLR
155.0000 [IU] | Freq: Once | INTRAMUSCULAR | Status: AC
  Administered 2023-02-18: 155 [IU] via INTRAMUSCULAR

## 2023-02-18 NOTE — Progress Notes (Signed)
Botox- 200 units x 1 vial Lot: [id number] Expiration: 11/2024 NDC: 1478-2956-21  Bacteriostatic 0.9% Sodium Chloride- * mL  Lot: [id number] Expiration: 06/17/2023 NDC: 8469-6295-28  Dx: U13.244 B/B Witnessed by Truitt Leep RN

## 2023-02-18 NOTE — Progress Notes (Signed)
02/18/23: Botox is working well.  No migraines in the month of November.  We did not inject masseters today we will reevaluate at the next visit.  11/27/22: reports that botox has worked well. Only had 1 migraine in the last month. Dr. Lucia Gaskins has been doing botox every 8 weeks. See has also been getting toradol after botox to prevent a migraine from coming after injections. Did not inject masseters today.    BOTOX PROCEDURE NOTE FOR MIGRAINE HEADACHE    Contraindications and precautions discussed with patient(above). Aseptic procedure was observed and patient tolerated procedure. Procedure performed by Butch Penny, NP  The condition has existed for more than 6 months, and pt does not have a diagnosis of ALS, Myasthenia Gravis or Lambert-Eaton Syndrome.  Risks and benefits of injections discussed and pt agrees to proceed with the procedure.  Written consent obtained  These injections do not cause sedations or hallucinations which the oral therapies may cause.  Indication/Diagnosis: chronic migraine BOTOX(J0585) injection was performed according to protocol by [employer]. 200 units of BOTOX was dissolved into 4 cc NS.   NDC: [address]-20  Type of toxin: Botox Botox- 200 units x 1 vial Lot: [id number] Expiration: 11/2024 NDC: 8366-2947-65   Bacteriostatic 0.9% Sodium Chloride- * mL  Lot: [id number] Expiration: 06/17/2023 NDC: 4656-8127-51   Dx: Z00.174 B/B      Description of procedure:  The patient was placed in a sitting position. The standard protocol was used for Botox as follows, with 5 units of Botox injected at each site:   -Procerus muscle, midline injection  -Corrugator muscle, bilateral injection  -Frontalis muscle, bilateral injection, with 2 sites each side, medial injection was performed in the upper one third of the frontalis muscle, in the region vertical from the medial inferior edge of the superior orbital rim. The lateral injection was again in the upper one  third of the forehead vertically above the lateral limbus of the cornea, 1.5 cm lateral to the medial injection site.  -Temporalis muscle injection, 4 sites, bilaterally. The first injection was 3 cm above the tragus of the ear, second injection site was 1.5 cm to 3 cm up from the first injection site in line with the tragus of the ear. The third injection site was 1.5-3 cm forward between the first 2 injection sites. The fourth injection site was 1.5 cm posterior to the second injection site.  -Occipitalis muscle injection, 3 sites, bilaterally. The first injection was done one half way between the occipital protuberance and the tip of the mastoid process behind the ear. The second injection site was done lateral and superior to the first, 1 fingerbreadth from the first injection. The third injection site was 1 fingerbreadth superiorly and medially from the first injection site.  -Cervical paraspinal muscle injection, 2 sites, bilateral knee first injection site was 1 cm from the midline of the cervical spine, 3 cm inferior to the lower border of the occipital protuberance. The second injection site was 1.5 cm superiorly and laterally to the first injection site.  -Trapezius muscle injection was performed at 3 sites, bilaterally. The first injection site was in the upper trapezius muscle halfway between the inflection point of the neck, and the acromion. The second injection site was one half way between the acromion and the first injection site. The third injection was done between the first injection site and the inflection point of the neck.   Will return for repeat injection in 3 months.   A 200 unit sof  Botox was used, 155 units were injected, the rest of the Botox was wasted. The patient tolerated the procedure well, there were no complications of the above procedure.  Butch Penny, MSN, NP-C 02/18/2023, 3:29 PM [facility] [address], [address] [place], [place]  [address] [phone]

## 2023-02-18 NOTE — Addendum Note (Signed)
Addended by: Berna Spare A on: 02/18/2023 04:13 PM   Modules accepted: Orders

## 2023-02-20 ENCOUNTER — Other Ambulatory Visit: Payer: Self-pay | Admitting: Physician Assistant

## 2023-02-24 ENCOUNTER — Encounter: Payer: Self-pay | Admitting: Physician Assistant

## 2023-02-24 NOTE — Telephone Encounter (Signed)
Please advise 

## 2023-02-27 DIAGNOSIS — F4322 Adjustment disorder with anxiety: Secondary | ICD-10-CM | POA: Diagnosis not present

## 2023-03-03 ENCOUNTER — Other Ambulatory Visit: Payer: Self-pay | Admitting: Physician Assistant

## 2023-03-03 ENCOUNTER — Other Ambulatory Visit: Payer: Self-pay | Admitting: Adult Health

## 2023-03-03 ENCOUNTER — Other Ambulatory Visit: Payer: Self-pay | Admitting: Neurology

## 2023-03-03 ENCOUNTER — Encounter: Payer: Self-pay | Admitting: Neurology

## 2023-03-03 DIAGNOSIS — F411 Generalized anxiety disorder: Secondary | ICD-10-CM

## 2023-03-03 MED ORDER — ONDANSETRON HCL 4 MG PO TABS: 4.0000 mg | ORAL_TABLET | Freq: Three times a day (TID) | ORAL | 3 refills | Status: AC | PRN

## 2023-03-03 NOTE — Telephone Encounter (Signed)
I called the patient. She states that her headache resolved when she woke up this morning. Today she has only had the after migraine hang over effect. She was able to go to work. Advised that if she has more headaches to let us know. She verbalized appreciation for my phone call.

## 2023-03-06 DIAGNOSIS — R509 Fever, unspecified: Secondary | ICD-10-CM | POA: Diagnosis not present

## 2023-03-06 DIAGNOSIS — U071 COVID-19: Secondary | ICD-10-CM | POA: Diagnosis not present

## 2023-03-06 DIAGNOSIS — R52 Pain, unspecified: Secondary | ICD-10-CM | POA: Diagnosis not present

## 2023-03-07 ENCOUNTER — Other Ambulatory Visit: Payer: Self-pay | Admitting: *Deleted

## 2023-03-07 MED ORDER — LINACLOTIDE 290 MCG PO CAPS: 290.0000 ug | ORAL_CAPSULE | Freq: Every day | ORAL | 2 refills | Status: DC

## 2023-03-09 DIAGNOSIS — H9203 Otalgia, bilateral: Secondary | ICD-10-CM | POA: Diagnosis not present

## 2023-03-09 DIAGNOSIS — U071 COVID-19: Secondary | ICD-10-CM | POA: Diagnosis not present

## 2023-03-09 DIAGNOSIS — J0140 Acute pansinusitis, unspecified: Secondary | ICD-10-CM | POA: Diagnosis not present

## 2023-03-13 ENCOUNTER — Other Ambulatory Visit: Payer: Self-pay | Admitting: Physician Assistant

## 2023-03-18 ENCOUNTER — Ambulatory Visit: Payer: BC Managed Care – PPO | Admitting: Physician Assistant

## 2023-03-26 ENCOUNTER — Ambulatory Visit: Payer: BC Managed Care – PPO | Admitting: Physician Assistant

## 2023-03-27 DIAGNOSIS — F4322 Adjustment disorder with anxiety: Secondary | ICD-10-CM | POA: Diagnosis not present

## 2023-04-03 ENCOUNTER — Ambulatory Visit: Payer: BC Managed Care – PPO | Admitting: Physician Assistant

## 2023-04-18 ENCOUNTER — Telehealth: Payer: Self-pay | Admitting: Adult Health

## 2023-04-18 DIAGNOSIS — H6993 Unspecified Eustachian tube disorder, bilateral: Secondary | ICD-10-CM | POA: Diagnosis not present

## 2023-04-18 DIAGNOSIS — J029 Acute pharyngitis, unspecified: Secondary | ICD-10-CM | POA: Diagnosis not present

## 2023-04-18 DIAGNOSIS — R051 Acute cough: Secondary | ICD-10-CM | POA: Diagnosis not present

## 2023-04-18 DIAGNOSIS — J0100 Acute maxillary sinusitis, unspecified: Secondary | ICD-10-CM | POA: Diagnosis not present

## 2023-04-18 NOTE — Telephone Encounter (Signed)
Completed [employer] PA form and placed in nurse pod for MD signature.

## 2023-04-22 NOTE — Telephone Encounter (Signed)
Faxed [employer] continuation form and OV notes to [phone].

## 2023-04-23 DIAGNOSIS — J101 Influenza due to other identified influenza virus with other respiratory manifestations: Secondary | ICD-10-CM | POA: Diagnosis not present

## 2023-04-23 DIAGNOSIS — R509 Fever, unspecified: Secondary | ICD-10-CM | POA: Diagnosis not present

## 2023-04-24 ENCOUNTER — Telehealth: Payer: Self-pay | Admitting: Pharmacy Technician

## 2023-04-24 ENCOUNTER — Other Ambulatory Visit (HOSPITAL_COMMUNITY): Payer: Self-pay

## 2023-04-24 NOTE — Telephone Encounter (Signed)
 Pharmacy Patient Advocate Encounter   Received notification from CoverMyMeds that prior authorization for Ubrelvy  100MG  tablets is required/requested.   Insurance verification completed.   The patient is insured through [facility] .   Per test claim: PA required; PA submitted to above mentioned insurance via CoverMyMeds Key/confirmation #/EOC [id number] Status is pending

## 2023-04-24 NOTE — Telephone Encounter (Signed)
 Pharmacy Patient Advocate Encounter  Received notification from [facility] that Prior Authorization for Ubrelvy  has been APPROVED from 04/24/2023 to 04/23/2024. Ran test claim, Copay is $0.00. This test claim was processed through [facility]- copay amounts may vary at other pharmacies due to pharmacy/plan contracts, or as the patient moves through the different stages of their insurance plan.

## 2023-04-28 NOTE — Telephone Encounter (Signed)
 Auth#: [id number] (04/22/23-03/23/24)

## 2023-05-01 DIAGNOSIS — F4322 Adjustment disorder with anxiety: Secondary | ICD-10-CM | POA: Diagnosis not present

## 2023-05-14 ENCOUNTER — Ambulatory Visit (INDEPENDENT_AMBULATORY_CARE_PROVIDER_SITE_OTHER): Payer: BC Managed Care – PPO | Admitting: Adult Health

## 2023-05-14 ENCOUNTER — Encounter: Payer: Self-pay | Admitting: Adult Health

## 2023-05-14 DIAGNOSIS — G43711 Chronic migraine without aura, intractable, with status migrainosus: Secondary | ICD-10-CM

## 2023-05-14 MED ORDER — KETOROLAC TROMETHAMINE 60 MG/2ML IM SOLN
60.0000 mg | Freq: Once | INTRAMUSCULAR | Status: AC
Start: 2023-05-14 — End: 2023-05-19

## 2023-05-14 MED ORDER — ONABOTULINUMTOXINA 200 UNITS IJ SOLR
155.0000 [IU] | Freq: Once | INTRAMUSCULAR | Status: AC
  Administered 2023-05-14: 145 [IU] via INTRAMUSCULAR

## 2023-05-14 NOTE — Progress Notes (Signed)
 Botox- 200 units x 1 vial Lot: [id number] Expiration: 06/2025 NDC: 6440-3474-25  Bacteriostatic 0.9% Sodium Chloride- 4 mL  Lot: [id number] Expiration: 01/17/2024 NDC: 5643-3295-18  Dx: A41.660 B/B Witnessed by Truitt Leep RN  Toradol 60 mg IM x 1 administered in RUOQ of R buttock per v.o. Megan NP. Pt tolerated well. Aseptic technique maintained.

## 2023-05-14 NOTE — Progress Notes (Signed)
 05/14/23: only one migraine in 2025.  Remains on [place] daily and Ubrelvy for abortive therapy.  She is considering a third pregnancy but is not fully decided.  We did review medication options today.  Patient would like her masseters injected today.  Dr. Daisy Blossom came in and injected both masseters 5 units each.  Dr. Lucia Gaskins approved her getting Toradol at her Botox procedure to prevent a migraine from coming on after the injections.  She received Toradol today  02/18/23: Botox is working well.  No migraines in the month of November.  We did not inject masseters today we will reevaluate at the next visit.  11/27/22: reports that botox has worked well. Only had 1 migraine in the last month. Dr. Lucia Gaskins has been doing botox every 8 weeks. See has also been getting toradol after botox to prevent a migraine from coming after injections. Did not inject masseters today.    BOTOX PROCEDURE NOTE FOR MIGRAINE HEADACHE    Contraindications and precautions discussed with patient(above). Aseptic procedure was observed and patient tolerated procedure. Procedure performed by Butch Penny, NP  The condition has existed for more than 6 months, and pt does not have a diagnosis of ALS, Myasthenia Gravis or Lambert-Eaton Syndrome.  Risks and benefits of injections discussed and pt agrees to proceed with the procedure.  Written consent obtained  These injections do not cause sedations or hallucinations which the oral therapies may cause.  Indication/Diagnosis: chronic migraine BOTOX([id number]) injection was performed according to protocol by [employer]. 200 units of BOTOX was dissolved into 4 cc NS.   NDC: [address]-40  Type of toxin: Botox Botox- 200 units x 1 vial Lot: [id number] Expiration: 06/2025 NDC: 9811-9147-82   Bacteriostatic 0.9% Sodium Chloride- 4 mL  Lot: [id number] Expiration: 01/17/2024 NDC: 0865-7846-96   Dx: E95.284        Description of procedure:  The patient was placed in a sitting  position. The standard protocol was used for Botox as follows, with 5 units of Botox injected at each site:   -Procerus muscle, midline injection  -Corrugator muscle, bilateral injection  -Frontalis muscle, bilateral injection, with 2 sites each side, medial injection was performed in the upper one third of the frontalis muscle, in the region vertical from the medial inferior edge of the superior orbital rim. The lateral injection was again in the upper one third of the forehead vertically above the lateral limbus of the cornea, 1.5 cm lateral to the medial injection site.  -Temporalis muscle injection, 4 sites, bilaterally. The first injection was 3 cm above the tragus of the ear, second injection site was 1.5 cm to 3 cm up from the first injection site in line with the tragus of the ear. The third injection site was 1.5-3 cm forward between the first 2 injection sites. The fourth injection site was 1.5 cm posterior to the second injection site.  -Occipitalis muscle injection, 3 sites, bilaterally. The first injection was done one half way between the occipital protuberance and the tip of the mastoid process behind the ear. The second injection site was done lateral and superior to the first, 1 fingerbreadth from the first injection. The third injection site was 1 fingerbreadth superiorly and medially from the first injection site.  -Cervical paraspinal muscle injection, 2 sites, bilateral knee first injection site was 1 cm from the midline of the cervical spine, 3 cm inferior to the lower border of the occipital protuberance. The second injection site was 1.5 cm superiorly and laterally to  the first injection site.  -Trapezius muscle injection was performed at 3 sites, bilaterally. The first injection site was in the upper trapezius muscle halfway between the inflection point of the neck, and the acromion. The second injection site was one half way between the acromion and the first injection site.  The third injection was done between the first injection site and the inflection point of the neck.  -Masseter muscle: 5 units bilaterally   Will return for repeat injection in 3 months.   A 200 unit sof Botox was used, 145 units were injected, the rest of the Botox was wasted. The patient tolerated the procedure well, there were no complications of the above procedure.  Butch Penny, MSN, NP-C 05/14/2023, 1:36 PM [facility] [address], [address] [place], [place] [address] [phone]

## 2023-05-15 DIAGNOSIS — F4322 Adjustment disorder with anxiety: Secondary | ICD-10-CM | POA: Diagnosis not present

## 2023-05-26 NOTE — Progress Notes (Deleted)
 Office Visit Note  Patient: Jennifer Vincent             Date of Birth: 10/01/1990           MRN: [id number]             PCP: Bary Leriche, PA-C Referring: Allwardt, Crist Infante, PA-C Visit Date: 06/09/2023 Occupation: @GUAROCC @  Subjective:  No chief complaint on file.   History of Present Illness: Jennifer Vincent is a 33 y.o. female ***     Activities of Daily Living:  Patient reports morning stiffness for *** {minute/hour:19697}.   Patient {ACTIONS;DENIES/REPORTS:21021675::"Denies"} nocturnal pain.  Difficulty dressing/grooming: {ACTIONS;DENIES/REPORTS:21021675::"Denies"} Difficulty climbing stairs: {ACTIONS;DENIES/REPORTS:21021675::"Denies"} Difficulty getting out of chair: {ACTIONS;DENIES/REPORTS:21021675::"Denies"} Difficulty using hands for taps, buttons, cutlery, and/or writing: {ACTIONS;DENIES/REPORTS:21021675::"Denies"}  No Rheumatology ROS completed.   PMFS History:  Patient Active Problem List   Diagnosis Date Noted   History of migraine 07/17/2022   Class 1 obesity due to excess calories without serious comorbidity with body mass index (BMI) of 32.0 to 32.9 in adult 07/17/2022   Family history of inflammatory bowel disease 07/17/2022   Unexplained night sweats 04/01/2022   Pain in pelvis 03/06/2022   Antiphospholipid antibody syndrome (HCC) 10/02/2020   Chronic migraine without aura, with intractable migraine, so stated, with status migrainosus 06/01/2019   Generalized anxiety disorder 01/11/2018   Panic disorder 10/04/2016   Anxiety and depression 10/04/2016    Past Medical History:  Diagnosis Date   Abnormal uterine bleeding (AUB)    Allergic rhinitis    Anxiety    Autoimmune disorder (HCC)    nonspecified   COVID 04/05/2020   Frequent migraine    IBS (irritable bowel syndrome)    Migraine    PCOS (polycystic ovarian syndrome)     Family History  Problem Relation Age of Onset   Depression Mother    Eating disorder Mother    Arthritis Father     Crohn's disease Father    Ulcerative colitis Father    Arthritis Brother    Alcohol abuse Brother    Mental illness Brother    Depression Brother    Anxiety disorder Brother    Crohn's disease Brother    Migraines Maternal Uncle    Breast cancer Maternal Grandmother    Mental illness Maternal Grandfather    Anxiety disorder Maternal Grandfather    OCD Maternal Grandfather    Heart disease Paternal Grandfather    Diabetes Paternal Grandfather    Heart attack Paternal Grandfather    Healthy Daughter    Healthy Son    Past Surgical History:  Procedure Laterality Date   DILATATION & CURETTAGE/HYSTEROSCOPY WITH MYOSURE N/A 08/27/2022   Procedure: DILATATION & CURETTAGE/HYSTEROSCOPY;  Surgeon: Harold Hedge, MD;  Location: [facility];  Service: Gynecology;  Laterality: N/A;   TONSILLECTOMY  2011   Social History   Social History Narrative   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   Physical Exam   Musculoskeletal Exam: ***  CDAI Exam: CDAI Score: -- Patient Global: --; Provider Global: -- Swollen: --; Tender: -- Joint Exam 06/09/2023   No joint exam has been documented for this visit   There is currently no information documented on the homunculus. Go to the Rheumatology activity and complete the homunculus joint exam.  Investigation: No additional  findings.  Imaging: No results found.  Recent Labs: Lab Results  Component Value Date   WBC 5.6 01/09/2023   HGB 15.1 01/09/2023   PLT 259 01/09/2023   NA 140 01/09/2023   K 4.9 01/09/2023   CL 104 01/09/2023   CO2 27 01/09/2023   GLUCOSE 77 01/09/2023   BUN 11 01/09/2023   CREATININE 0.83 01/09/2023   BILITOT 0.6 01/09/2023   ALKPHOS 22 (L) 11/22/2022   AST 22 01/09/2023   ALT 19 01/09/2023   PROT 7.3 01/09/2023   ALBUMIN 4.4 11/22/2022   CALCIUM 9.8 01/09/2023   GFRAA 119 07/29/2019   QFTBGOLDPLUS NEGATIVE 07/29/2019    Speciality Comments: PLQ eye exam: 10/22/2022 WNL. [facility]. Follow up in 1 year.  Procedures:  No procedures performed Allergies: Depakote [divalproex sodium], Lactose, and Lactose intolerance (gi)   Assessment / Plan:     Visit Diagnoses: Autoimmune disease (HCC)  High risk medication use  Anticardiolipin antibody positive  Pain in both hands  Pain of both elbows  Chondromalacia of both patellae  Low serum alkaline phosphatase  Vitamin D deficiency  Other fatigue  Generalized anxiety disorder  Panic disorder  History of COVID-19  History of shingles  History of IBS  History of migraine  Family history of ulcerative colitis  Family history of Crohn's disease  Orders: No orders of the defined types were placed in this encounter.  No orders of the defined types were placed in this encounter.   Face-to-face time spent with patient was *** minutes. Greater than 50% of time was spent in counseling and coordination of care.  Follow-Up Instructions: No follow-ups on file.   Gearldine Bienenstock, PA-C  Note - This record has been created using Dragon software.  Chart creation errors have been sought, but may not always  have been located. Such creation errors do not reflect on  the standard of medical care.

## 2023-05-29 DIAGNOSIS — F4322 Adjustment disorder with anxiety: Secondary | ICD-10-CM | POA: Diagnosis not present

## 2023-06-09 ENCOUNTER — Ambulatory Visit: Payer: BC Managed Care – PPO | Admitting: Physician Assistant

## 2023-06-09 DIAGNOSIS — Z8669 Personal history of other diseases of the nervous system and sense organs: Secondary | ICD-10-CM

## 2023-06-09 DIAGNOSIS — F410 Panic disorder [episodic paroxysmal anxiety] without agoraphobia: Secondary | ICD-10-CM

## 2023-06-09 DIAGNOSIS — R748 Abnormal levels of other serum enzymes: Secondary | ICD-10-CM

## 2023-06-09 DIAGNOSIS — Z8619 Personal history of other infectious and parasitic diseases: Secondary | ICD-10-CM

## 2023-06-09 DIAGNOSIS — M2242 Chondromalacia patellae, left knee: Secondary | ICD-10-CM

## 2023-06-09 DIAGNOSIS — M79641 Pain in right hand: Secondary | ICD-10-CM

## 2023-06-09 DIAGNOSIS — E559 Vitamin D deficiency, unspecified: Secondary | ICD-10-CM

## 2023-06-09 DIAGNOSIS — Z8616 Personal history of COVID-19: Secondary | ICD-10-CM

## 2023-06-09 DIAGNOSIS — M359 Systemic involvement of connective tissue, unspecified: Secondary | ICD-10-CM

## 2023-06-09 DIAGNOSIS — R5383 Other fatigue: Secondary | ICD-10-CM

## 2023-06-09 DIAGNOSIS — R760 Raised antibody titer: Secondary | ICD-10-CM

## 2023-06-09 DIAGNOSIS — F411 Generalized anxiety disorder: Secondary | ICD-10-CM

## 2023-06-09 DIAGNOSIS — Z79899 Other long term (current) drug therapy: Secondary | ICD-10-CM

## 2023-06-09 DIAGNOSIS — Z8379 Family history of other diseases of the digestive system: Secondary | ICD-10-CM

## 2023-06-09 DIAGNOSIS — Z8719 Personal history of other diseases of the digestive system: Secondary | ICD-10-CM

## 2023-06-09 DIAGNOSIS — M25521 Pain in right elbow: Secondary | ICD-10-CM

## 2023-06-09 NOTE — Progress Notes (Unsigned)
 Office Visit Note  Patient: Jennifer Vincent             Date of Birth: 06-30-1990           MRN: [id number]             PCP: Bary Leriche, PA-C Referring: Allwardt, Crist Infante, PA-C Visit Date: 06/12/2023 Occupation: @GUAROCC @  Subjective:  No chief complaint on file.   History of Present Illness: Jennifer Vincent is a 33 y.o. female ***     Activities of Daily Living:  Patient reports morning stiffness for *** {minute/hour:19697}.   Patient {ACTIONS;DENIES/REPORTS:21021675::"Denies"} nocturnal pain.  Difficulty dressing/grooming: {ACTIONS;DENIES/REPORTS:21021675::"Denies"} Difficulty climbing stairs: {ACTIONS;DENIES/REPORTS:21021675::"Denies"} Difficulty getting out of chair: {ACTIONS;DENIES/REPORTS:21021675::"Denies"} Difficulty using hands for taps, buttons, cutlery, and/or writing: {ACTIONS;DENIES/REPORTS:21021675::"Denies"}  No Rheumatology ROS completed.   PMFS History:  Patient Active Problem List   Diagnosis Date Noted  . History of migraine 07/17/2022  . Class 1 obesity due to excess calories without serious comorbidity with body mass index (BMI) of 32.0 to 32.9 in adult 07/17/2022  . Family history of inflammatory bowel disease 07/17/2022  . Unexplained night sweats 04/01/2022  . Pain in pelvis 03/06/2022  . Antiphospholipid antibody syndrome (HCC) 10/02/2020  . Chronic migraine without aura, with intractable migraine, so stated, with status migrainosus 06/01/2019  . Generalized anxiety disorder 01/11/2018  . Panic disorder 10/04/2016  . Anxiety and depression 10/04/2016    Past Medical History:  Diagnosis Date  . Abnormal uterine bleeding (AUB)   . Allergic rhinitis   . Anxiety   . Autoimmune disorder (HCC)    nonspecified  . COVID 04/05/2020  . Frequent migraine   . IBS (irritable bowel syndrome)   . Migraine   . PCOS (polycystic ovarian syndrome)     Family History  Problem Relation Age of Onset  . Depression Mother   . Eating disorder Mother   .  Arthritis Father   . Crohn's disease Father   . Ulcerative colitis Father   . Arthritis Brother   . Alcohol abuse Brother   . Mental illness Brother   . Depression Brother   . Anxiety disorder Brother   . Crohn's disease Brother   . Migraines Maternal Uncle   . Breast cancer Maternal Grandmother   . Mental illness Maternal Grandfather   . Anxiety disorder Maternal Grandfather   . OCD Maternal Grandfather   . Heart disease Paternal Grandfather   . Diabetes Paternal Grandfather   . Heart attack Paternal Grandfather   . Healthy Daughter   . Healthy Son    Past Surgical History:  Procedure Laterality Date  . DILATATION & CURETTAGE/HYSTEROSCOPY WITH MYOSURE N/A 08/27/2022   Procedure: DILATATION & CURETTAGE/HYSTEROSCOPY;  Surgeon: Harold Hedge, MD;  Location: [facility] [employer];  Service: Gynecology;  Laterality: N/A;  . 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 Influenza,inj,Quad PF,6+ Mos 12/06/2021  . Influenza-Unspecified 12/16/2016, 12/25/2017, 12/16/2018, 01/16/2021, 11/19/2021  . PFIZER(Purple Top)SARS-COV-2 Vaccination 05/13/2019, 06/02/2019, 02/16/2020  . PPD Test 10/13/2014  Objective: Vital Signs: There were no vitals taken for this visit.   Physical Exam   Musculoskeletal Exam: ***  CDAI Exam: CDAI Score: -- Patient Global: --; Provider Global: -- Swollen: --; Tender: -- Joint Exam 06/12/2023   No joint exam has been documented for this visit   There is currently no information documented on the homunculus. Go to the Rheumatology activity and complete the homunculus joint  exam.  Investigation: No additional findings.  Imaging: No results found.  Recent Labs: Lab Results  Component Value Date   WBC 5.6 01/09/2023   HGB 15.1 01/09/2023   PLT 259 01/09/2023   NA 140 01/09/2023   K 4.9 01/09/2023   CL 104 01/09/2023   CO2 27 01/09/2023   GLUCOSE 77 01/09/2023   BUN 11 01/09/2023   CREATININE 0.83 01/09/2023   BILITOT 0.6 01/09/2023   ALKPHOS 22 (L) 11/22/2022   AST 22 01/09/2023   ALT 19 01/09/2023   PROT 7.3 01/09/2023   ALBUMIN 4.4 11/22/2022   CALCIUM 9.8 01/09/2023   GFRAA 119 07/29/2019   QFTBGOLDPLUS NEGATIVE 07/29/2019    Speciality Comments: PLQ eye exam: 10/22/2022 WNL. [facility]. Follow up in 1 year.  Procedures:  No procedures performed Allergies: Depakote [divalproex sodium], Lactose, and Lactose intolerance (gi)   Assessment / Plan:     Visit Diagnoses: No diagnosis found.  Orders: No orders of the defined types were placed in this encounter.  No orders of the defined types were placed in this encounter.   Face-to-face time spent with patient was *** minutes. Greater than 50% of time was spent in counseling and coordination of care.  Follow-Up Instructions: No follow-ups on file.   Ellen Henri, CMA  Note - This record has been created using Animal nutritionist.  Chart creation errors have been sought, but may not always  have been located. Such creation errors do not reflect on  the standard of medical care.

## 2023-06-12 ENCOUNTER — Ambulatory Visit: Attending: Physician Assistant | Admitting: Physician Assistant

## 2023-06-12 ENCOUNTER — Encounter: Payer: Self-pay | Admitting: Physician Assistant

## 2023-06-12 VITALS — BP 114/81 | HR 97 | Resp 13 | Ht 69.0 in | Wt 194.0 lb

## 2023-06-12 DIAGNOSIS — M79641 Pain in right hand: Secondary | ICD-10-CM | POA: Diagnosis not present

## 2023-06-12 DIAGNOSIS — Z8669 Personal history of other diseases of the nervous system and sense organs: Secondary | ICD-10-CM

## 2023-06-12 DIAGNOSIS — M359 Systemic involvement of connective tissue, unspecified: Secondary | ICD-10-CM

## 2023-06-12 DIAGNOSIS — Z8719 Personal history of other diseases of the digestive system: Secondary | ICD-10-CM

## 2023-06-12 DIAGNOSIS — Z8379 Family history of other diseases of the digestive system: Secondary | ICD-10-CM

## 2023-06-12 DIAGNOSIS — F410 Panic disorder [episodic paroxysmal anxiety] without agoraphobia: Secondary | ICD-10-CM

## 2023-06-12 DIAGNOSIS — R760 Raised antibody titer: Secondary | ICD-10-CM

## 2023-06-12 DIAGNOSIS — F411 Generalized anxiety disorder: Secondary | ICD-10-CM

## 2023-06-12 DIAGNOSIS — Z79899 Other long term (current) drug therapy: Secondary | ICD-10-CM

## 2023-06-12 DIAGNOSIS — M25521 Pain in right elbow: Secondary | ICD-10-CM

## 2023-06-12 DIAGNOSIS — L659 Nonscarring hair loss, unspecified: Secondary | ICD-10-CM

## 2023-06-12 DIAGNOSIS — E538 Deficiency of other specified B group vitamins: Secondary | ICD-10-CM

## 2023-06-12 DIAGNOSIS — Z8619 Personal history of other infectious and parasitic diseases: Secondary | ICD-10-CM

## 2023-06-12 DIAGNOSIS — Z8616 Personal history of COVID-19: Secondary | ICD-10-CM

## 2023-06-12 DIAGNOSIS — I7300 Raynaud's syndrome without gangrene: Secondary | ICD-10-CM

## 2023-06-12 DIAGNOSIS — M79642 Pain in left hand: Secondary | ICD-10-CM

## 2023-06-12 DIAGNOSIS — M2241 Chondromalacia patellae, right knee: Secondary | ICD-10-CM

## 2023-06-12 DIAGNOSIS — E559 Vitamin D deficiency, unspecified: Secondary | ICD-10-CM

## 2023-06-12 DIAGNOSIS — M2242 Chondromalacia patellae, left knee: Secondary | ICD-10-CM

## 2023-06-12 DIAGNOSIS — R5383 Other fatigue: Secondary | ICD-10-CM

## 2023-06-12 DIAGNOSIS — M25522 Pain in left elbow: Secondary | ICD-10-CM

## 2023-06-12 DIAGNOSIS — F4322 Adjustment disorder with anxiety: Secondary | ICD-10-CM | POA: Diagnosis not present

## 2023-06-12 DIAGNOSIS — R748 Abnormal levels of other serum enzymes: Secondary | ICD-10-CM

## 2023-06-12 MED ORDER — HYDROXYCHLOROQUINE SULFATE 200 MG PO TABS
200.0000 mg | ORAL_TABLET | Freq: Two times a day (BID) | ORAL | 0 refills | Status: DC
Start: 2023-06-12 — End: 2023-09-08

## 2023-06-13 NOTE — Progress Notes (Signed)
 CBC stable.  Alk phos remains low but stable. Rest of CMP WNL.   ESR WNL Vitamin B12 WNL Folate WNL Iron panel WNL-ferritin remains on lower end of normal-24 but has improved.  CRP WNL Urine protein creatinine ratio WNL  Complements WNL

## 2023-06-13 NOTE — Telephone Encounter (Signed)
 We can place a referral to endocrinology for low alk phos if she would like further evaluation

## 2023-06-14 LAB — COMPREHENSIVE METABOLIC PANEL WITH GFR
AG Ratio: 1.7 (calc) (ref 1.0–2.5)
ALT: 22 U/L (ref 6–29)
AST: 18 U/L (ref 10–30)
Albumin: 4.5 g/dL (ref 3.6–5.1)
Alkaline phosphatase (APISO): 27 U/L — ABNORMAL LOW (ref 31–125)
BUN: 12 mg/dL (ref 7–25)
CO2: 28 mmol/L (ref 20–32)
Calcium: 9.2 mg/dL (ref 8.6–10.2)
Chloride: 104 mmol/L (ref 98–110)
Creat: 0.81 mg/dL (ref 0.50–0.97)
Globulin: 2.7 g/dL (ref 1.9–3.7)
Glucose, Bld: 60 mg/dL — ABNORMAL LOW (ref 65–99)
Potassium: 4.5 mmol/L (ref 3.5–5.3)
Sodium: 141 mmol/L (ref 135–146)
Total Bilirubin: 0.5 mg/dL (ref 0.2–1.2)
Total Protein: 7.2 g/dL (ref 6.1–8.1)
eGFR: 98 mL/min/{1.73_m2} (ref >=60)

## 2023-06-14 LAB — C3 AND C4
C3 Complement: 120 mg/dL (ref 83–193)
C4 Complement: 18 mg/dL (ref 15–57)

## 2023-06-14 LAB — CBC WITH DIFFERENTIAL/PLATELET
Absolute Lymphocytes: 1440 {cells}/uL (ref 850–3900)
Absolute Monocytes: 239 {cells}/uL (ref 200–950)
Basophils Absolute: 50 {cells}/uL (ref 0–200)
Basophils Relative: 1.1 %
Eosinophils Absolute: 50 {cells}/uL (ref 15–500)
Eosinophils Relative: 1.1 %
HCT: 45.6 % — ABNORMAL HIGH (ref 35.0–45.0)
Hemoglobin: 15.1 g/dL (ref 11.7–15.5)
MCH: 31.6 pg (ref 27.0–33.0)
MCHC: 33.1 g/dL (ref 32.0–36.0)
MCV: 95.4 fL (ref 80.0–100.0)
MPV: 12.2 fL (ref 7.5–12.5)
Monocytes Relative: 5.3 %
Neutro Abs: 2723 {cells}/uL (ref 1500–7800)
Neutrophils Relative %: 60.5 %
Platelets: 247 10*3/uL (ref 140–400)
RBC: 4.78 10*6/uL (ref 3.80–5.10)
RDW: 12.5 % (ref 11.0–15.0)
Total Lymphocyte: 32 %
WBC: 4.5 10*3/uL (ref 3.8–10.8)

## 2023-06-14 LAB — SEDIMENTATION RATE: Sed Rate: 2 mm/h (ref 0–20)

## 2023-06-14 LAB — ANTI-NUCLEAR AB-TITER (ANA TITER): ANA Titer 1: 1:80 {titer} — ABNORMAL HIGH

## 2023-06-14 LAB — ANA: Anti Nuclear Antibody (ANA): POSITIVE — AB

## 2023-06-14 LAB — PROTEIN / CREATININE RATIO, URINE
Creatinine, Urine: 227 mg/dL (ref 20–275)
Protein/Creat Ratio: 79 mg/g{creat} (ref 24–184)
Protein/Creatinine Ratio: 0.079 mg/mg{creat} (ref 0.024–0.184)
Total Protein, Urine: 18 mg/dL (ref 5–24)

## 2023-06-14 LAB — B12 AND FOLATE PANEL
Folate: 10.3 ng/mL
Vitamin B-12: 518 pg/mL (ref 200–1100)

## 2023-06-14 LAB — C-REACTIVE PROTEIN: CRP: <3 mg/L (ref <8.0)

## 2023-06-14 LAB — IRON,TIBC AND FERRITIN PANEL
%SAT: 27 % (ref 16–45)
Ferritin: 24 ng/mL (ref 16–154)
Iron: 100 ug/dL (ref 40–190)
TIBC: 367 ug/dL (ref 250–450)

## 2023-06-14 LAB — ANTI-DNA ANTIBODY, DOUBLE-STRANDED: ds DNA Ab: 1 [IU]/mL

## 2023-06-15 NOTE — Progress Notes (Signed)
 ANA remains positive-low titer.  dsDNA is negative  Iron panel WNL

## 2023-06-25 ENCOUNTER — Other Ambulatory Visit: Payer: Self-pay | Admitting: *Deleted

## 2023-06-25 DIAGNOSIS — R760 Raised antibody titer: Secondary | ICD-10-CM

## 2023-06-25 DIAGNOSIS — M359 Systemic involvement of connective tissue, unspecified: Secondary | ICD-10-CM

## 2023-06-25 DIAGNOSIS — Z79899 Other long term (current) drug therapy: Secondary | ICD-10-CM

## 2023-06-25 NOTE — Telephone Encounter (Signed)
 Ok to check ESR, CRP, MCV, RF, anti-CCP. We can also schedule an ultrasound of both hands/wrists to assess for synovitis

## 2023-06-26 NOTE — Progress Notes (Signed)
ESR and CRP WNL  RF negative

## 2023-06-28 LAB — RHEUMATOID FACTOR: Rheumatoid fact SerPl-aCnc: <10 [IU]/mL (ref <14)

## 2023-06-28 LAB — CYCLIC CITRUL PEPTIDE ANTIBODY, IGG: Cyclic Citrullin Peptide Ab: <16 U

## 2023-06-28 LAB — MUTATED CITRULLINATED VIMENTIN (MCV) ANTIBODY: MUTATED CITRULLINATED VIMENTIN (MCV) AB: <20 U/mL (ref <20)

## 2023-06-28 LAB — C-REACTIVE PROTEIN: CRP: <3 mg/L (ref <8.0)

## 2023-06-28 LAB — SEDIMENTATION RATE: Sed Rate: 2 mm/h (ref 0–20)

## 2023-06-28 NOTE — Progress Notes (Signed)
 CCP negative, MCV negative CRP normal

## 2023-06-30 ENCOUNTER — Encounter: Payer: Self-pay | Admitting: Rheumatology

## 2023-06-30 ENCOUNTER — Ambulatory Visit: Attending: Rheumatology | Admitting: Rheumatology

## 2023-06-30 ENCOUNTER — Ambulatory Visit

## 2023-06-30 VITALS — BP 113/78 | HR 84 | Resp 16 | Ht 68.0 in | Wt 194.0 lb

## 2023-06-30 DIAGNOSIS — F410 Panic disorder [episodic paroxysmal anxiety] without agoraphobia: Secondary | ICD-10-CM

## 2023-06-30 DIAGNOSIS — M359 Systemic involvement of connective tissue, unspecified: Secondary | ICD-10-CM | POA: Diagnosis not present

## 2023-06-30 DIAGNOSIS — M7712 Lateral epicondylitis, left elbow: Secondary | ICD-10-CM

## 2023-06-30 DIAGNOSIS — M249 Joint derangement, unspecified: Secondary | ICD-10-CM

## 2023-06-30 DIAGNOSIS — M7918 Myalgia, other site: Secondary | ICD-10-CM

## 2023-06-30 DIAGNOSIS — Z79899 Other long term (current) drug therapy: Secondary | ICD-10-CM

## 2023-06-30 DIAGNOSIS — E559 Vitamin D deficiency, unspecified: Secondary | ICD-10-CM

## 2023-06-30 DIAGNOSIS — E538 Deficiency of other specified B group vitamins: Secondary | ICD-10-CM

## 2023-06-30 DIAGNOSIS — M79641 Pain in right hand: Secondary | ICD-10-CM | POA: Diagnosis not present

## 2023-06-30 DIAGNOSIS — M79642 Pain in left hand: Secondary | ICD-10-CM

## 2023-06-30 DIAGNOSIS — R5383 Other fatigue: Secondary | ICD-10-CM

## 2023-06-30 DIAGNOSIS — Z8379 Family history of other diseases of the digestive system: Secondary | ICD-10-CM

## 2023-06-30 DIAGNOSIS — M7711 Lateral epicondylitis, right elbow: Secondary | ICD-10-CM | POA: Diagnosis not present

## 2023-06-30 DIAGNOSIS — I7300 Raynaud's syndrome without gangrene: Secondary | ICD-10-CM

## 2023-06-30 DIAGNOSIS — Z8719 Personal history of other diseases of the digestive system: Secondary | ICD-10-CM

## 2023-06-30 DIAGNOSIS — M2241 Chondromalacia patellae, right knee: Secondary | ICD-10-CM

## 2023-06-30 DIAGNOSIS — M2242 Chondromalacia patellae, left knee: Secondary | ICD-10-CM

## 2023-06-30 DIAGNOSIS — L659 Nonscarring hair loss, unspecified: Secondary | ICD-10-CM

## 2023-06-30 DIAGNOSIS — F411 Generalized anxiety disorder: Secondary | ICD-10-CM

## 2023-06-30 DIAGNOSIS — Z8669 Personal history of other diseases of the nervous system and sense organs: Secondary | ICD-10-CM

## 2023-06-30 MED ORDER — LIDOCAINE HCL 1 % IJ SOLN
1.0000 mL | INTRAMUSCULAR | Status: AC | PRN
  Administered 2023-06-30: 1 mL

## 2023-06-30 MED ORDER — TRIAMCINOLONE ACETONIDE 40 MG/ML IJ SUSP
30.0000 mg | INTRAMUSCULAR | Status: AC | PRN
  Administered 2023-06-30: 30 mg via INTRA_ARTICULAR

## 2023-06-30 NOTE — Progress Notes (Signed)
 Office Visit Note  Patient: Jennifer Vincent             Date of Birth: 01/20/1991           MRN: [id number]             PCP: Bary Leriche, PA-C Referring: Allwardt, Crist Infante, PA-C Visit Date: 06/30/2023 Occupation: @GUAROCC @  Subjective:  No chief complaint on file.   History of Present Illness: Jennifer Vincent is a 33 y.o. female with undifferentiated connective tissue disease.  She returns today after her last visit on December 13, 2023 for ultrasound of her bilateral hands.  She states for the last few weeks she has been having increased pain in her elbows and her hands.  She states she has been having nocturnal pain despite taking Motrin at nighttime.  She denies any increased stressors in her life recently.  She has been experiencing tingling in her wrist which goes into her hands.  She continues to have fatigue, dry mouth, dry eyes, Raynauds, hair loss.  She has been using sunscreen on a regular basis.  She takes Plaquenil 1 tablet p.o. twice daily without any interruption.    Activities of Daily Living:  Patient reports morning stiffness for 10  minutes.   Patient Reports nocturnal pain.  Difficulty dressing/grooming: Denies Difficulty climbing stairs: Denies Difficulty getting out of chair: Denies Difficulty using hands for taps, buttons, cutlery, and/or writing: Denies  Review of Systems  Constitutional:  Positive for fatigue.  HENT:  Positive for mouth dryness. Negative for mouth sores.   Eyes:  Positive for dryness.  Respiratory:  Negative for shortness of breath.   Cardiovascular:  Negative for chest pain and palpitations.  Gastrointestinal:  Positive for constipation. Negative for blood in stool and diarrhea.  Endocrine: Negative for increased urination.  Genitourinary:  Negative for difficulty urinating.  Musculoskeletal:  Positive for joint pain, joint pain, myalgias, morning stiffness and myalgias.  Skin:  Positive for color change and hair loss. Negative for rash  and sensitivity to sunlight.  Allergic/Immunologic: Negative for susceptible to infections.  Neurological:  Negative for dizziness and headaches.  Hematological:  Negative for swollen glands.  Psychiatric/Behavioral:  Positive for sleep disturbance. Negative for depressed mood. The patient is nervous/anxious.     PMFS History:  Patient Active Problem List   Diagnosis Date Noted   History of migraine 07/17/2022   Class 1 obesity due to excess calories without serious comorbidity with body mass index (BMI) of 32.0 to 32.9 in adult 07/17/2022   Family history of inflammatory bowel disease 07/17/2022   Unexplained night sweats 04/01/2022   Pain in pelvis 03/06/2022   Antiphospholipid antibody syndrome (HCC) 10/02/2020   Chronic migraine without aura, with intractable migraine, so stated, with status migrainosus 06/01/2019   Generalized anxiety disorder 01/11/2018   Panic disorder 10/04/2016   Anxiety and depression 10/04/2016    Past Medical History:  Diagnosis Date   Abnormal uterine bleeding (AUB)    Allergic rhinitis    Anxiety    Autoimmune disorder (HCC)    nonspecified   COVID 04/05/2020   Frequent migraine    IBS (irritable bowel syndrome)    Migraine    PCOS (polycystic ovarian syndrome)     Family History  Problem Relation Age of Onset   Depression Mother    Eating disorder Mother    Arthritis Father    Crohn's disease Father    Ulcerative colitis Father    Arthritis Brother  Alcohol abuse Brother    Mental illness Brother    Depression Brother    Anxiety disorder Brother    Crohn's disease Brother    Migraines Maternal Uncle    Breast cancer Maternal Grandmother    Mental illness Maternal Grandfather    Anxiety disorder Maternal Grandfather    OCD Maternal Grandfather    Heart disease Paternal Grandfather    Diabetes Paternal Grandfather    Heart attack Paternal Grandfather    Healthy Daughter    Healthy Son    Past Surgical History:  Procedure  Laterality Date   DILATATION & CURETTAGE/HYSTEROSCOPY WITH MYOSURE N/A 08/27/2022   Procedure: DILATATION & CURETTAGE/HYSTEROSCOPY;  Surgeon: Thora Flint, MD;  Location: [facility];  Service: Gynecology;  Laterality: N/A;   TONSILLECTOMY  2011   Social History   Social History Narrative   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      Home Situation: lives with husband and daughter      Spiritual Beliefs: Jewish      Lifestyle:  regular, aerobic and orange theory; diet healthy       Update: uses peloton 5x a week      Right handed      Caffeine: 1 cup/day   Immunization History  Administered Date(s) Administered   Influenza,inj,Quad PF,6+ Mos 12/06/2021   Influenza-Unspecified 12/16/2016, 12/25/2017, 12/16/2018, 01/16/2021, 11/19/2021   PFIZER(Purple Top)SARS-COV-2 Vaccination 05/13/2019, 06/02/2019, 02/16/2020   PPD Test 10/13/2014     Objective: Vital Signs: BP 113/78 (BP Location: Left Arm, Patient Position: Sitting, Cuff Size: Normal)   Pulse 84   Resp 16   Ht 5\' 8"  (1.727 m)   Wt 194 lb (88 kg) Comment: per patient  BMI 29.50 kg/m    Physical Exam Vitals and nursing note reviewed.  Constitutional:      Appearance: She is well-developed.  HENT:     Head: Normocephalic and atraumatic.  Eyes:     Conjunctiva/sclera: Conjunctivae normal.  Cardiovascular:     Rate and Rhythm: Normal rate and regular rhythm.     Heart sounds: Normal heart sounds.  Pulmonary:     Effort: Pulmonary effort is normal.     Breath sounds: Normal breath sounds.  Abdominal:     General: Bowel sounds are normal.     Palpations: Abdomen is soft.  Musculoskeletal:     Cervical back: Normal range of motion.  Lymphadenopathy:     Cervical: No cervical adenopathy.  Skin:    General: Skin is warm and dry.     Capillary Refill: Capillary refill takes less than 2 seconds.  Neurological:     Mental Status: She is alert and oriented to person, place, and time.   Psychiatric:        Behavior: Behavior normal.      Musculoskeletal Exam: Cervical, thoracic and lumbar spine were in good range of motion.  She had bilateral trapezius spasm and tenderness.  She had tenderness over costochondral junction at the insertion of the deltoid and also over the bilateral lateral epicondyle region.  There was no synovitis over wrist joints, MCPs PIPs or DIPs.  Hip joints and knee joints in good range of motion without any warmth swelling or effusion.  There was no tenderness over ankles or MTPs.  CDAI Exam: CDAI Score: -- Patient Global: --; Provider Global: -- Swollen: --; Tender: -- Joint Exam 06/30/2023   No joint exam has been documented for this visit   There is currently no information  documented on the homunculus. Go to the Rheumatology activity and complete the homunculus joint exam.  Investigation: No additional findings.  Imaging: [place] LIMITED JOINT SPACE STRUCTURES UP BILAT Result Date: 06/30/2023 Ultrasound examination of bilateral hands was performed per EULAR recommendations. Using 15 MHz transducer, grayscale and power Doppler bilateral second and third  MCP joints both dorsal and volar aspects were evaluated to look for synovitis or tenosynovitis. The findings were there was no synovitis or tenosynovitis on ultrasound examination. Right median nerve was 0.09 cm squares which was within normal limits and left median nerve was 0.09 cm squares which was within normal limits. Impression: No synovitis was noted on the ultrasound examination.    Recent Labs: Lab Results  Component Value Date   WBC 4.5 06/12/2023   HGB 15.1 06/12/2023   PLT 247 06/12/2023   NA 141 06/12/2023   K 4.5 06/12/2023   CL 104 06/12/2023   CO2 28 06/12/2023   GLUCOSE 60 (L) 06/12/2023   BUN 12 06/12/2023   CREATININE 0.81 06/12/2023   BILITOT 0.5 06/12/2023   ALKPHOS 22 (L) 11/22/2022   AST 18 06/12/2023   ALT 22 06/12/2023   PROT 7.2 06/12/2023   ALBUMIN 4.4  11/22/2022   CALCIUM 9.2 06/12/2023   GFRAA 119 07/29/2019   QFTBGOLDPLUS NEGATIVE 07/29/2019   June 12, 2023 urine protein creatinine ratio normal, ANA 1: 80 NS, dsDNA negative, C3-C4 normal, ESR 2, CRP<3.0, RF negative, anti-CCP negative, MCV negative, folate normal, B12 normal, Iron studies normal   Speciality Comments: PLQ eye exam: 10/22/2022 WNL. [facility]. Follow up in 1 year.  Procedures:  Medium Joint Inj: R lateral epicondyle on 06/30/2023 10:25 AM Indications: pain Details: 27 G 1.5 in needle, posterior approach Medications: 1 mL lidocaine 1 %; 30 mg triamcinolone acetonide 40 MG/ML Aspirate: 0 mL Outcome: tolerated well, no immediate complications Procedure, treatment alternatives, risks and benefits explained, specific risks discussed. Consent was given by the patient. Immediately prior to procedure a time out was called to verify the correct patient, procedure, equipment, support staff and site/side marked as required. Patient was prepped and draped in the usual sterile fashion.     Allergies: Depakote [divalproex sodium], Lactose, and Lactose intolerance (gi)   Assessment / Plan:     Visit Diagnoses: Undifferentiated connective tissue disease (HCC) - Positive ANA, positive SCL 70, fatigue, inflammatory arthritis: History of inflammatory arthritis in the past.  No synovitis was noted on the examination.  Patient reports increased pain and discomfort in her bilateral elbows radiating into her bilateral hands.  She gives history of nocturnal pain.  No synovitis was noted on the examination.  Recent labs were reviewed with the patient.  ANA is low titer positive, double-stranded DNA negative, complements normal, sed rate normal, CRP normal, rheumatoid factor negative, anti-CCP negative, MCV negative.  Urine protein creatinine ratio was normal.  High risk medication use - Plaquenil 200 mg p.o. twice daily.  Plaquenil eye examination October 22, 2022.  CBC and CMP was stable in  March 2025.  Pain in both hands -she complains of ongoing pain and discomfort in her bilateral hands.  She gives history of pain and swelling in her hands.  No synovitis was noted on the examination today.  Limited ultrasound examination of bilateral hands did not show synovitis in the bilateral 2nd and 3rd MCP joints.  Bilateral median nerves were within normal - Plan: [place] LIMITED JOINT SPACE STRUCTURES UP BILAT.  Ultrasound findings were reviewed with the patient.  Lateral epicondylitis of both elbows-she complains of pain and discomfort in her bilateral elbows.  She has been lifting weights and also has been lifting her 88-year-old child.  She had tenderness over bilateral lateral epicondyle region.  Detailed counseling guarding lateral epicondylitis was provided.  A handout on exercises was given.  I also offered physical therapy which she declined.  She has been having severe pain and nocturnal pain we decided to inject her right lateral epicondyle region with the lidocaine Kenalog as described above.  Side effects (risk of infection, tendon and nerve injury, hypopigmentation and dermal atrophy POCUS) were discussed.  Patient wanted to proceed with the injection.  Patient tolerated the procedure well.  Postprocedure instructions were given.  A prescription for a tennis elbow brace was given.  I also advised patient to contact us  if she wants to have left lateral epicondyle injection.  She has hypermobility in her joints.  Isometric exercises were discussed.  Chondromalacia of both patellae-currently not symptomatic.  Hypermobility of joint-hypermobility was noted in her elbows, MCPs and PIP joints.  Myofascial pain-she had tender points in the trapezius region, costochondral region, insertion of the deltoid and lateral epicondyle region.  Stretching exercises were emphasized.  Patient does yoga and Pilates.  Vitamin D deficiency-her vitamin D was low normal.  She was advised to take vitamin D 2000  units daily.  Hair loss-patient states that hair loss is improving.  Raynaud's syndrome without gangrene-she could use to have Raynaud's phenomenon.  Keeping core temperature warm and warm clothing was discussed.  Other fatigue-recent labs including iron panel, B12 and folate were normal.  Lab results were reviewed with the patient.  Other medical problems are listed as follows:  Vitamin B12 deficiency  Generalized anxiety disorder  Panic disorder  History of migraine  History of IBS  Family history of Crohn's disease  Family history of ulcerative colitis  Orders: Orders Placed This Encounter  Procedures   Medium Joint Inj   US  LIMITED JOINT SPACE STRUCTURES UP BILAT   No orders of the defined types were placed in this encounter.   Follow-Up Instructions: Return for Polyarthralgia.   Nicholas Bari, MD  Note - This record has been created using Animal nutritionist.  Chart creation errors have been sought, but may not always  have been located. Such creation errors do not reflect on  the standard of medical care.

## 2023-06-30 NOTE — Patient Instructions (Signed)
 Exercises for Elbow and Forearm Elbow and forearm exercises can help you get better after an injury or health problem. Only do the exercises you were told to do. Make sure you know how to do the exercises safely. Follow the steps below. It's normal to feel mild discomfort. Stop if you feel pain or your pain gets worse. Do not start these exercises until told by your health care provider. Range-of-motion exercises These exercises warm up your muscles and joints. They can help your elbow and forearm move better and be more flexible. These exercises are done using the muscles in your injured elbow and forearm. Elbow flexion, active  Hold your left / right arm at your side. Bend your elbow as far as you can using only your arm muscles. Hold this position for __________ seconds. Slowly go back to the starting position. Repeat __________ times. Do this exercise __________ times a day. Elbow extension, active  Hold your left / right arm at your side. Straighten your elbow as much as you can using only your arm muscles. Hold this position for __________ seconds. Slowly go back to the starting position. Repeat __________ times. Do this exercise __________ times a day. Forearm rotation, supination This is an exercise in which you turn your forearm palm-up. Stand or sit with your elbows at your sides. Bend your left / right elbow to a 90-degree angle (right angle). Position your forearm so that your thumb faces the ceiling. Turn your palm up toward the ceiling until you feel a gentle stretch on the inside of your forearm. If told, use your other hand to help turn your forearm farther until you feel a gentle to moderate stretch. Hold this position for __________ seconds. Slowly go back to the starting position. Repeat __________ times. Do this exercise __________ times a day. Forearm rotation, pronation This is an exercise in which you turn your forearm palm-down. Stand or sit with your elbows at  your sides. Bend your left / right elbow to a 90-degree angle. Position your forearm so that your thumb faces the ceiling. Turn your palm down until you feel a gentle stretch on the top of your forearm. If told, use your other hand to help turn your forearm farther until you feel a gentle to moderate stretch. Hold this position for __________ seconds. Slowly go back to the starting position. Repeat __________ times. Do this exercise __________ times a day. Stretching exercises These exercises warm up your muscles and joints. They're done using your healthy elbow and forearm to help stretch the muscles in your injured elbow and forearm. Elbow flexion, active-assisted  Hold your left / right arm at your side. Bend your elbow as much as you can using your left / right arm muscles. Use your other hand to bend your left / right elbow farther. Gently push up on your forearm until you feel a gentle stretch on the back of your elbow. Hold this position for __________ seconds. Slowly go back to the starting position. Repeat __________ times. Do this exercise __________ times a day. Elbow extension, active-assisted  Hold your left / right arm at your side. Straighten your elbow as much as you can using your left / right arm muscles. Use your other hand to straighten the left / right elbow farther. Gently push down on your forearm until you feel a gentle stretch on the inside of your elbow. Hold this position for __________ seconds. Slowly go back to the starting position. Repeat __________ times. Do  this exercise __________ times a day. Passive elbow flexion, supine  Lie on your back. Lift your left / right arm up in the air, bracing it with your other hand. Let your left / right hand slowly lower toward your shoulder. Keep your elbow pointed toward the ceiling. You should feel a gentle stretch along the back of your upper arm and elbow. If told, you may add a small wrist weight or hand weight to  increase the stretch. Hold this position for __________ seconds. Slowly go back to the starting position. Repeat __________ times. Do this exercise __________ times a day. Passive elbow extension, supine  Lie on your back. Make sure you're comfortable and can relax your arm muscles. Place a folded towel under your left / right upper arm so your elbow and shoulder are at the same height. Straighten your left / right arm so your elbow doesn't rest on the bed or towel. Let the weight of your hand stretch your elbow. Keep your arm and chest muscles relaxed. You should feel a stretch on the inside of your elbow. If told, you may add a small wrist weight or hand weight to increase the stretch. Hold this position for __________ seconds. Slowly release the stretch. Repeat __________ times. Do this exercise __________ times a day. Strengthening exercises These exercises build strength and endurance in your elbow and forearm. Endurance is the ability to use your muscles for a long time, even after they get tired. Elbow flexion, isometric  Stand or sit up straight. Bend your left / right elbow in a 90-degree angle. Keep your forearm at the height of your waist. Your thumb should point toward the ceiling. Place your other hand on top of your left / right forearm. Gently push down while you resist with your left / right arm. Push as hard as you can with both arms without causing any pain or movement at your left / right elbow. Hold this position for __________ seconds. Slowly release the tension in both arms. Let your muscles fully relax. Repeat __________ times. Do this exercise __________ times a day. Elbow extension, isometric  Stand or sit up straight. Place your left / right arm so your palm faces your belly and is at the height of your waist. Place your other hand on the underside of your left / right forearm. Gently push up while you resist with your left / right arm. Push as hard as you can  with both arms without causing any pain or movement at your left / right elbow. Hold this position for __________ seconds. Slowly release the tension in both arms. Let your muscles fully relax. Repeat __________ times. Do this exercise __________ times a day. Elbow flexion with forearm palm up  Sit on a firm chair without armrests or stand up. Place your left / right arm at your side with your elbow straight and your palm facing forward. Holding a __________ weight or gripping a rubber exercise band or tubing, bend your elbow to bring your hand toward your shoulder. Hold this position for __________ seconds. Slowly go back to the starting position. Repeat __________ times. Do this exercise __________ times a day. Elbow extension, active  Sit on a firm chair without armrests or stand up. Hold a rubber exercise band or tubing in both hands. Keep your upper arms at your sides. Bring both hands up to your left / right shoulder. Keep your left / right hand just below your other hand. Straighten your left /  right elbow. Keep your other arm still. Hold this position for __________ seconds. Control the resistance of the band or tubing as you go back to the starting position. Repeat __________ times. Do this exercise __________ times a day. Forearm rotation with weight, supination  Sit with your left / right forearm supported on a table. Your elbow should be at waist height and bent at a 90-degree angle. Gently grasp a lightweight hammer. Rest your hand over the edge of the table with your palm facing down. Without moving your left / right elbow, slowly turn your forearm to turn your palm up toward the ceiling. Hold this position for __________ seconds. Slowly go back to the starting position. Repeat __________ times. Do this exercise __________ times a day. Forearm rotation with weight, pronation  Sit with your left / right forearm supported on a table. Keep your elbow below shoulder  height. Gently grasp a lightweight hammer. Rest your hand over the edge of the table with your palm facing up. Without moving your left / right elbow, slowly turn your forearm to turn your palm down toward the floor. Hold this position for __________ seconds. Slowly go back to the starting position. Repeat __________ times. Do this exercise __________ times a day. This information is not intended to replace advice given to you by your health care provider. Make sure you discuss any questions you have with your health care provider. Document Revised: 09/26/2022 Document Reviewed: 09/26/2022 Elsevier Patient Education  2024 [employer].

## 2023-07-01 ENCOUNTER — Telehealth: Payer: Self-pay

## 2023-07-01 ENCOUNTER — Other Ambulatory Visit (HOSPITAL_COMMUNITY): Payer: Self-pay

## 2023-07-01 NOTE — Telephone Encounter (Signed)
 Pharmacy Patient Advocate Encounter   Received notification from CoverMyMeds that prior authorization for Qulipta 60MG  tablets is required/requested.   Insurance verification completed.   The patient is insured through [facility] .   Per test claim: PA required; PA submitted to above mentioned insurance via CoverMyMeds Key/confirmation #/EOC [id number] Status is pending

## 2023-07-02 NOTE — Telephone Encounter (Signed)
 Pharmacy Patient Advocate Encounter  Received notification from [facility] that Prior Authorization for Qulipta 60MG  tablets has been DENIED.  Full denial letter will be uploaded to the media tab. See denial reason below.   PA #/Case ID/Reference #: PA Case ID #: [id number]

## 2023-07-02 NOTE — Telephone Encounter (Signed)
 Continuation

## 2023-07-03 NOTE — Telephone Encounter (Signed)
 Forwarded to our Pharmacist for an appeals review.

## 2023-07-04 ENCOUNTER — Telehealth: Payer: Self-pay | Admitting: Pharmacist

## 2023-07-04 NOTE — Telephone Encounter (Signed)
 An E-Appeal has been submitted for Qulipta . Will advise when response is received, please be advised that most companies may take 30 days to make a decision. Appeal letter and supporting documentation were uploaded and submitted via the [facility] Website on 07/04/2023 @2 :24 pm.  Thank you, Devere Pandy, PharmD Clinical Pharmacist  [place]  Direct Dial: [phone]

## 2023-07-07 ENCOUNTER — Encounter: Payer: Self-pay | Admitting: *Deleted

## 2023-07-07 NOTE — Telephone Encounter (Signed)
 Awesome! I let the patient know.

## 2023-07-07 NOTE — Telephone Encounter (Signed)
 Thank you :)

## 2023-07-07 NOTE — Telephone Encounter (Signed)
 The appeal for Qulipta  has been approved by the insurance:

## 2023-07-16 ENCOUNTER — Encounter: Payer: Self-pay | Admitting: Internal Medicine

## 2023-07-24 DIAGNOSIS — F4322 Adjustment disorder with anxiety: Secondary | ICD-10-CM | POA: Diagnosis not present

## 2023-08-19 ENCOUNTER — Ambulatory Visit (INDEPENDENT_AMBULATORY_CARE_PROVIDER_SITE_OTHER): Payer: BC Managed Care – PPO | Admitting: Adult Health

## 2023-08-19 ENCOUNTER — Ambulatory Visit (AMBULATORY_SURGERY_CENTER): Admitting: *Deleted

## 2023-08-19 VITALS — Ht 68.0 in | Wt 185.0 lb

## 2023-08-19 DIAGNOSIS — K625 Hemorrhage of anus and rectum: Secondary | ICD-10-CM

## 2023-08-19 DIAGNOSIS — G43711 Chronic migraine without aura, intractable, with status migrainosus: Secondary | ICD-10-CM | POA: Diagnosis not present

## 2023-08-19 DIAGNOSIS — K5904 Chronic idiopathic constipation: Secondary | ICD-10-CM

## 2023-08-19 MED ORDER — NA SULFATE-K SULFATE-MG SULF 17.5-3.13-1.6 GM/177ML PO SOLN: 1.0000 | Freq: Once | ORAL | 0 refills | Status: AC

## 2023-08-19 MED ORDER — KETOROLAC TROMETHAMINE 60 MG/2ML IM SOLN
30.0000 mg | Freq: Once | INTRAMUSCULAR | Status: AC
  Administered 2023-08-19: 30 mg via INTRAMUSCULAR

## 2023-08-19 MED ORDER — ONABOTULINUMTOXINA 200 UNITS IJ SOLR
155.0000 [IU] | Freq: Once | INTRAMUSCULAR | Status: AC
  Administered 2023-08-19: 155 [IU] via INTRAMUSCULAR

## 2023-08-19 NOTE — Progress Notes (Signed)
 Botox - 200 units x 1 vial Lot: [id number] Expiration: 01/2026 NDC: 1610-9604-54  Bacteriostatic 0.9% Sodium Chloride - 4 mL  Lot: [id number] Expiration: 08/2024 NDC: 1478-2956-21  Dx: H08.657 B/B Witnessed by Lurena Sally

## 2023-08-19 NOTE — Progress Notes (Signed)
 Pt's name and DOB verified at the beginning of the pre-visit wit 2 identifiers  Pt denies any difficulty with ambulating,sitting, laying down or rolling side to side  Pt has no issues moving head neck or swallowing  No egg or soy allergy known to patient   No issues known to pt with past sedation with any surgeries or procedures  No FH of Malignant Hyperthermia  Pt is not on home 02   Pt is not on blood thinners   Pt has frequent issues with constipation RN instructed pt to use Miralax per bottles instructions a week before prep days. Pt states they will  Pt is not on dialysis  Pt denise any abnormal heart rhythms   Pt denies any upcoming cardiac testing  Patient's chart reviewed by Rogena Class CNRA prior to pre-visit and patient appropriate for the [facility].  Pre-visit completed and red dot placed by patient's name on their procedure day (on provider's schedule).    Visit by phone  Pt states weight is 185 lb   IInstructions reviewed. Pt given  both [facility] main # and MD on call # prior to instructions.  Pt states understanding of instructions. Instructed pt to review instructions again prior to procedure and call main # given if has questions.. Pt states they will.   Instructed pt on where to find instructions on My Chart.

## 2023-08-19 NOTE — Addendum Note (Signed)
 Addended by: Molly Angers R on: 08/19/2023 09:13 AM   Modules accepted: Orders

## 2023-08-19 NOTE — Progress Notes (Signed)
 08/19/23: Patient reports that overall she feels that her migraines have remained stable.  She reports that she only had 3 migraines since the last Botox  injections.  She does get mild headaches as well.  She states that those have increased in the last couple of weeks.  But are manageable with over-the-counter medication.  She continues on Qulipta  and Ubrelvy .  She does get Toradol  before Botox  procedure to prevent rebound headaches after getting Botox .  Today we will give 30 mg IM to see if that is beneficial.  She denies any kidney disease.  05/14/23: only one migraine in 2025.  Remains on Qulipta  daily and Ubrelvy  for abortive therapy.  She is considering a third pregnancy but is not fully decided.  We did review medication options today.  Patient would like her masseters injected today.  Dr. Narciso Backers came in and injected both masseters 5 units each.  Dr. Tresia Fruit approved her getting Toradol  at her Botox  procedure to prevent a migraine from coming on after the injections.  She received Toradol  today  02/18/23: Botox  is working well.  No migraines in the month of November.  We did not inject masseters today we will reevaluate at the next visit.  11/27/22: reports that botox  has worked well. Only had 1 migraine in the last month. Dr. Tresia Fruit has been doing botox  every 8 weeks. See has also been getting toradol  after botox  to prevent a migraine from coming after injections. Did not inject masseters today.    BOTOX  PROCEDURE NOTE FOR MIGRAINE HEADACHE    Contraindications and precautions discussed with patient(above). Aseptic procedure was observed and patient tolerated procedure. Procedure performed by Clem Currier, NP  The condition has existed for more than 6 months, and pt does not have a diagnosis of ALS, Myasthenia Gravis or Lambert-Eaton Syndrome.  Risks and benefits of injections discussed and pt agrees to proceed with the procedure.  Written consent obtained  These injections do not cause  sedations or hallucinations which the oral therapies may cause.  Indication/Diagnosis: chronic migraine BOTOX (W2956) injection was performed according to protocol by [employer]. 200 units of BOTOX  was dissolved into 4 cc NS.   NDC: [address]-46  Type of toxin: Botox    Botox - 200 units x 1 vial Lot: [id number] Expiration: 01/2026 NDC: 4132-4401-02   Bacteriostatic 0.9% Sodium Chloride - 4mL  Lot: [id number] Expiration: 08/2024 NDC: 4403-4742-59   Dx: D63.875            Description of procedure:  The patient was placed in a sitting position. The standard protocol was used for Botox  as follows, with 5 units of Botox  injected at each site:   -Procerus muscle, midline injection  -Corrugator muscle, bilateral injection  -Frontalis muscle, bilateral injection, with 2 sites each side, medial injection was performed in the upper one third of the frontalis muscle, in the region vertical from the medial inferior edge of the superior orbital rim. The lateral injection was again in the upper one third of the forehead vertically above the lateral limbus of the cornea, 1.5 cm lateral to the medial injection site.  -Temporalis muscle injection, 4 sites, bilaterally. The first injection was 3 cm above the tragus of the ear, second injection site was 1.5 cm to 3 cm up from the first injection site in line with the tragus of the ear. The third injection site was 1.5-3 cm forward between the first 2 injection sites. The fourth injection site was 1.5 cm posterior to the second injection site.  -Occipitalis muscle injection,  3 sites, bilaterally. The first injection was done one half way between the occipital protuberance and the tip of the mastoid process behind the ear. The second injection site was done lateral and superior to the first, 1 fingerbreadth from the first injection. The third injection site was 1 fingerbreadth superiorly and medially from the first injection site.  -Cervical paraspinal  muscle injection, 2 sites, bilateral knee first injection site was 1 cm from the midline of the cervical spine, 3 cm inferior to the lower border of the occipital protuberance. The second injection site was 1.5 cm superiorly and laterally to the first injection site.  -Trapezius muscle injection was performed at 3 sites, bilaterally. The first injection site was in the upper trapezius muscle halfway between the inflection point of the neck, and the acromion. The second injection site was one half way between the acromion and the first injection site. The third injection was done between the first injection site and the inflection point of the neck.     Will return for repeat injection in 3 months.   A 200 unit sof Botox  was used, 155 units were injected, the rest of the Botox  was wasted. The patient tolerated the procedure well, there were no complications of the above procedure.  Clem Currier, MSN, NP-C 08/19/2023, 8:26 AM [facility] [address], [address] [place], [place] [address] [phone]

## 2023-08-20 ENCOUNTER — Encounter: Payer: Self-pay | Admitting: Internal Medicine

## 2023-08-27 ENCOUNTER — Telehealth: Payer: Self-pay | Admitting: Internal Medicine

## 2023-08-27 NOTE — Telephone Encounter (Signed)
 Returned patient call. Updated instructions with new date and time, and sent through MyChart.  Advised patient to call back if she can't find them in her letters.

## 2023-08-27 NOTE — Telephone Encounter (Signed)
 Patient called and stated that she was needing new instructions due to her colonoscopy procedure was rescheduled for July 23 rd. Patient would like her instruction to be sent to her my chart. Please advise.

## 2023-08-27 NOTE — Telephone Encounter (Signed)
 Good afternoon Dr. Rosaline Coma, I received a call from this patient requesting to reschedule her colonoscopy for June 17 th due to her having an unexpected complication with her 34 year old son. Patient was reschedule for July 23 rd. Please advise.

## 2023-09-01 DIAGNOSIS — F4322 Adjustment disorder with anxiety: Secondary | ICD-10-CM | POA: Diagnosis not present

## 2023-09-02 ENCOUNTER — Encounter: Admitting: Internal Medicine

## 2023-09-06 ENCOUNTER — Other Ambulatory Visit: Payer: Self-pay | Admitting: Physician Assistant

## 2023-09-06 DIAGNOSIS — L659 Nonscarring hair loss, unspecified: Secondary | ICD-10-CM

## 2023-09-06 DIAGNOSIS — M359 Systemic involvement of connective tissue, unspecified: Secondary | ICD-10-CM

## 2023-09-08 ENCOUNTER — Other Ambulatory Visit: Payer: Self-pay | Admitting: Physician Assistant

## 2023-09-08 DIAGNOSIS — F411 Generalized anxiety disorder: Secondary | ICD-10-CM

## 2023-09-08 NOTE — Telephone Encounter (Signed)
 Last Fill: 06/12/2023  Eye exam: 10/22/2022 WNL.    Labs: 06/12/2023 CBC stable.  Alk phos remains low but stable. Rest of CMP WNL.   ESR WNL  Vitamin B12 WNL  Folate WNL  Iron panel WNL-ferritin remains on lower end of normal-24 but has improved.  CRP WNL  Urine protein creatinine ratio WNL  Complements WNL   Next Visit: 11/13/2023  Last Visit: 06/12/2023  IK:Lwipqqzmzwupjuzi connective tissue disease   Current Dose per office note 06/12/2023: Plaquenil  200 mg 1 tablet by mouth twice daily   Okay to refill Plaquenil ?

## 2023-09-17 DIAGNOSIS — L650 Telogen effluvium: Secondary | ICD-10-CM | POA: Diagnosis not present

## 2023-09-17 DIAGNOSIS — L658 Other specified nonscarring hair loss: Secondary | ICD-10-CM | POA: Diagnosis not present

## 2023-09-23 ENCOUNTER — Other Ambulatory Visit: Payer: Self-pay

## 2023-09-23 ENCOUNTER — Encounter: Payer: Self-pay | Admitting: Physician Assistant

## 2023-09-23 DIAGNOSIS — F411 Generalized anxiety disorder: Secondary | ICD-10-CM

## 2023-09-23 MED ORDER — BUSPIRONE HCL 15 MG PO TABS: ORAL_TABLET | ORAL | 0 refills | Status: DC

## 2023-09-23 NOTE — Telephone Encounter (Signed)
 Please see pt msg and advise if ok to send in partial refill until patient upcoming appt.

## 2023-09-25 DIAGNOSIS — F4322 Adjustment disorder with anxiety: Secondary | ICD-10-CM | POA: Diagnosis not present

## 2023-09-29 ENCOUNTER — Ambulatory Visit (INDEPENDENT_AMBULATORY_CARE_PROVIDER_SITE_OTHER): Admitting: Physician Assistant

## 2023-09-29 ENCOUNTER — Encounter: Payer: Self-pay | Admitting: Physician Assistant

## 2023-09-29 VITALS — BP 102/74 | HR 82 | Temp 97.8°F | Ht 68.0 in | Wt 201.4 lb

## 2023-09-29 DIAGNOSIS — G43711 Chronic migraine without aura, intractable, with status migrainosus: Secondary | ICD-10-CM | POA: Diagnosis not present

## 2023-09-29 DIAGNOSIS — U071 COVID-19: Secondary | ICD-10-CM | POA: Insufficient documentation

## 2023-09-29 DIAGNOSIS — F411 Generalized anxiety disorder: Secondary | ICD-10-CM

## 2023-09-29 DIAGNOSIS — K589 Irritable bowel syndrome without diarrhea: Secondary | ICD-10-CM

## 2023-09-29 MED ORDER — SERTRALINE HCL 100 MG PO TABS: 100.0000 mg | ORAL_TABLET | Freq: Every day | ORAL | 3 refills | Status: AC

## 2023-09-29 MED ORDER — BUSPIRONE HCL 15 MG PO TABS: 30.0000 mg | ORAL_TABLET | Freq: Two times a day (BID) | ORAL | 3 refills | Status: AC

## 2023-09-29 NOTE — Progress Notes (Signed)
 Patient ID: Jennifer Vincent, female    DOB: 29-Apr-1990, 33 y.o.   MRN: [id number]   Assessment & Plan:  Chronic migraine without aura, with intractable migraine, so stated, with status migrainosus  Generalized anxiety disorder -     busPIRone  HCl; Take 2 tablets (30 mg total) by mouth 2 (two) times daily. TAKE 2 TABLETS(30 MG) BY MOUTH TWICE DAILY  Dispense: 360 tablet; Refill: 3 -     Sertraline  HCl; Take 1 tablet (100 mg total) by mouth daily.  Dispense: 90 tablet; Refill: 3  Irritable bowel syndrome, unspecified type    Assessment & Plan Generalized Anxiety Disorder (GAD) GAD is well-managed with Buspar  and Zoloft . Rarely requires Xanax , indicating good control of anxiety symptoms. - Send 90-day refill for Buspar  25 mg twice daily. - Send 90-day refill for Zoloft  100 mg daily.  Migraine Uses continuous birth control pills for migraine management. Reports occasional breakthrough bleeding but no significant issues. Headaches are currently stable. - Continue current birth control regimen for migraine management.  Irritable Bowel Syndrome (IBS) Reports occasional bleeding and bloating. A colonoscopy is scheduled for next week due to family history and previous polyp removal. - Proceed with scheduled colonoscopy next week.  General Health Maintenance Engages in regular physical activity, including hot yoga and Pilates. Discussed the importance of maintaining a healthy lifestyle and the potential impact of weight on health. Discussed the importance of flu vaccination. - Administer flu shot in the fall.  Follow-up Well-managed on current medications and lifestyle. - Schedule next annual visit in one year.      Return in about 1 year (around 09/28/2024) for recheck/follow-up.    Subjective:    Chief Complaint  Patient presents with   Medication Refill    Medication Refill   Discussed the use of AI scribe software for clinical note transcription with the patient, who  gave verbal consent to proceed.  History of Present Illness Jennifer Vincent is a 33 year old female with anxiety who presents for medication management.  Her anxiety is stable with Buspar  25 mg twice daily, which she finds effective. She rarely uses Xanax , with the last fill in December of the previous year. She also takes Zoloft  100 mg daily and experiences intermittent night sweats.  She has a history of migraines and uses continuous birth control pills to manage them, occasionally experiencing breakthrough bleeding.  She has a history of IBS, with occasional bloating and cramping. She is scheduled for a colonoscopy next week due to a family history of gastrointestinal issues and previous polyp removal.  She engages in regular physical activity, attending hot yoga or hot Pilates three times a week and walking on other days. She feels her anxiety is well-managed with her current routine and medication. She has two children, aged five and three, whose growing independence has positively impacted her stress levels. She recently completed a graduate program and is enjoying a more relaxed summer with her family.  No chest pain, shortness of breath, skin changes, or significant GI symptoms aside from occasional bloating and cramping.     Past Medical History:  Diagnosis Date   Abnormal uterine bleeding (AUB)    Allergic rhinitis    Anxiety    Autoimmune disorder (HCC)    nonspecified   COVID 04/05/2020   COVID-19 09/29/2023   Covid positive 1/22     Frequent migraine    IBS (irritable bowel syndrome)    Migraine    PCOS (polycystic ovarian syndrome)  Vaginal delivery 09/29/2023   07/03/18, 39wga,IOL, F, 9#4oz, [place]      Past Surgical History:  Procedure Laterality Date   DILATATION & CURETTAGE/HYSTEROSCOPY WITH MYOSURE N/A 08/27/2022   Procedure: DILATATION & CURETTAGE/HYSTEROSCOPY;  Surgeon: Curlene Agent, MD;  Location: [facility];  Service: Gynecology;  Laterality:  N/A;   TONSILLECTOMY  2011   VAGINAL DELIVERY     x 2    Family History  Problem Relation Age of Onset   Depression Mother    Eating disorder Mother    Arthritis Father    Crohn's disease Father    Ulcerative colitis Father    Arthritis Brother    Alcohol abuse Brother    Mental illness Brother    Depression Brother    Anxiety disorder Brother    Crohn's disease Brother    Migraines Maternal Uncle    Breast cancer Maternal Grandmother    Mental illness Maternal Grandfather    Anxiety disorder Maternal Grandfather    OCD Maternal Grandfather    Heart disease Paternal Grandfather    Diabetes Paternal Grandfather    Heart attack Paternal Grandfather    Healthy Daughter    Healthy Son    Colon polyps Neg Hx    Colon cancer Neg Hx    Esophageal cancer Neg Hx    Rectal cancer Neg Hx    Stomach cancer Neg Hx     Social History   Tobacco Use   Smoking status: Never    Passive exposure: Never   Smokeless tobacco: Never  Vaping Use   Vaping status: Never Used  Substance Use Topics   Alcohol use: Not Currently   Drug use: Never     Allergies  Allergen Reactions   Depakote [Divalproex Sodium]     Depression/suicidal thoughts   Lactose Diarrhea    GI upset   Lactose Intolerance (Gi)     GI upset    Review of Systems NEGATIVE UNLESS OTHERWISE INDICATED IN HPI      Objective:     BP 102/74 (BP Location: Left Arm, Patient Position: Sitting, Cuff Size: Normal)   Pulse 82   Temp 97.8 F (36.6 C)   Ht 5' 8 (1.727 m)   Wt 201 lb 6.4 oz (91.4 kg)   SpO2 100%   BMI 30.62 kg/m   Wt Readings from Last 3 Encounters:  09/29/23 201 lb 6.4 oz (91.4 kg)  08/19/23 185 lb (83.9 kg)  06/30/23 194 lb (88 kg)    BP Readings from Last 3 Encounters:  09/29/23 102/74  06/30/23 113/78  06/12/23 114/81     Physical Exam Vitals and nursing note reviewed.  Constitutional:      Appearance: Normal appearance. She is obese.  Eyes:     Extraocular Movements:  Extraocular movements intact.     Conjunctiva/sclera: Conjunctivae normal.     Pupils: Pupils are equal, round, and reactive to light.  Cardiovascular:     Rate and Rhythm: Normal rate and regular rhythm.     Pulses: Normal pulses.     Heart sounds: Normal heart sounds.  Pulmonary:     Effort: Pulmonary effort is normal.     Breath sounds: Normal breath sounds.  Skin:    Findings: No rash.  Neurological:     General: No focal deficit present.     Mental Status: She is alert.  Psychiatric:        Mood and Affect: Mood normal.  Behavior: Behavior normal.         Ernesta Trabert M Skyllar Notarianni, PA-C

## 2023-10-07 ENCOUNTER — Ambulatory Visit: Admitting: Physician Assistant

## 2023-10-07 ENCOUNTER — Other Ambulatory Visit: Payer: Self-pay | Admitting: Physician Assistant

## 2023-10-07 DIAGNOSIS — F411 Generalized anxiety disorder: Secondary | ICD-10-CM

## 2023-10-08 ENCOUNTER — Encounter: Payer: Self-pay | Admitting: Internal Medicine

## 2023-10-08 ENCOUNTER — Other Ambulatory Visit: Payer: Self-pay | Admitting: Medical Genetics

## 2023-10-08 ENCOUNTER — Ambulatory Visit: Admitting: Internal Medicine

## 2023-10-08 VITALS — BP 120/81 | HR 83 | Temp 98.1°F | Resp 14 | Ht 68.0 in | Wt 185.0 lb

## 2023-10-08 DIAGNOSIS — K529 Noninfective gastroenteritis and colitis, unspecified: Secondary | ICD-10-CM | POA: Diagnosis not present

## 2023-10-08 DIAGNOSIS — K648 Other hemorrhoids: Secondary | ICD-10-CM

## 2023-10-08 DIAGNOSIS — K625 Hemorrhage of anus and rectum: Secondary | ICD-10-CM | POA: Diagnosis not present

## 2023-10-08 DIAGNOSIS — K649 Unspecified hemorrhoids: Secondary | ICD-10-CM

## 2023-10-08 DIAGNOSIS — Z8379 Family history of other diseases of the digestive system: Secondary | ICD-10-CM

## 2023-10-08 DIAGNOSIS — K5000 Crohn's disease of small intestine without complications: Secondary | ICD-10-CM | POA: Diagnosis not present

## 2023-10-08 MED ORDER — HYDROCORTISONE (PERIANAL) 2.5 % EX CREA: 1.0000 | TOPICAL_CREAM | Freq: Two times a day (BID) | CUTANEOUS | 1 refills | Status: AC

## 2023-10-08 MED ORDER — SODIUM CHLORIDE 0.9 % IV SOLN: 500.0000 mL | Freq: Once | INTRAVENOUS | Status: DC

## 2023-10-08 NOTE — Progress Notes (Signed)
 GASTROENTEROLOGY PROCEDURE H&P NOTE   Primary Care Physician: Allwardt, Mardy HERO, PA-C    Reason for Procedure:   Rectal bleeding  Plan:    Colonoscopy   Patient is appropriate for endoscopic procedure(s) in the ambulatory ([facility]) setting.  The nature of the procedure, as well as the risks, benefits, and alternatives were carefully and thoroughly reviewed with the patient. Ample time for discussion and questions allowed. The patient understood, was satisfied, and agreed to proceed.     HPI: Jennifer Vincent is a 33 y.o. female who presents for colonoscopy for evaluation of rectal bleeding.  Patient was most recently seen in the Gastroenterology Clinic on 08/19/23.  No interval change in medical history since that appointment. Please refer to that note for full details regarding GI history and clinical presentation.   Past Medical History:  Diagnosis Date   Abnormal uterine bleeding (AUB)    Allergic rhinitis    Anxiety    Autoimmune disorder (HCC)    nonspecified   COVID 04/05/2020   COVID-19 09/29/2023   Covid positive 1/22     Frequent migraine    IBS (irritable bowel syndrome)    Migraine    PCOS (polycystic ovarian syndrome)    Vaginal delivery 09/29/2023   07/03/18, 39wga,IOL, F, 9#4oz, [place]      Past Surgical History:  Procedure Laterality Date   DILATATION & CURETTAGE/HYSTEROSCOPY WITH MYOSURE N/A 08/27/2022   Procedure: DILATATION & CURETTAGE/HYSTEROSCOPY;  Surgeon: Curlene Agent, MD;  Location: [facility];  Service: Gynecology;  Laterality: N/A;   TONSILLECTOMY  2011   VAGINAL DELIVERY     x 2    Prior to Admission medications   Medication Sig Start Date End Date Taking? Authorizing Provider  ALPRAZolam  (XANAX ) 0.25 MG tablet TAKE 1 TABLET(0.25 MG) BY MOUTH TWICE DAILY AS NEEDED FOR ANXIETY 03/13/23  Yes Allwardt, Alyssa M, PA-C  Atogepant  (QULIPTA ) 60 MG TABS TAKE 1 TABLET BY MOUTH DAILY 03/05/23  Yes Ines Onetha NOVAK, MD  busPIRone  (BUSPAR ) 15  MG tablet Take 2 tablets (30 mg total) by mouth 2 (two) times daily. TAKE 2 TABLETS(30 MG) BY MOUTH TWICE DAILY 09/29/23 12/28/23 Yes Allwardt, Alyssa M, PA-C  hydroxychloroquine  (PLAQUENIL ) 200 MG tablet TAKE 1 TABLET(200 MG) BY MOUTH TWICE DAILY 09/08/23  Yes Cheryl Waddell HERO, PA-C  JUNEL FE 1/20 1-20 MG-MCG tablet Take 1 tablet by mouth every evening. 05/29/21  Yes [provider]  sertraline  (ZOLOFT ) 100 MG tablet Take 1 tablet (100 mg total) by mouth daily. 09/29/23  Yes Allwardt, Alyssa M, PA-C  hydrOXYzine  (ATARAX ) 25 MG tablet TAKE 1 TABLET(25 MG) BY MOUTH AT BEDTIME AS NEEDED FOR ANXIETY 03/03/23   Allwardt, Alyssa M, PA-C  OnabotulinumtoxinA  (BOTOX  IJ) Inject as directed.    [provider]  ondansetron  (ZOFRAN ) 4 MG tablet Take 1 tablet (4 mg total) by mouth every 8 (eight) hours as needed for nausea or vomiting. 03/03/23   Millikan, Megan, NP  polyethylene glycol powder (GLYCOLAX/MIRALAX) 17 GM/SCOOP powder Take by mouth once.    [provider]  Ubrogepant  (UBRELVY ) 100 MG TABS TAKE 1 TABLET BY MOUTH AT ONSET OF MIGRAINE. MAY TAKE ANOTHER 2 HOUR LATER AS NEEDED 08/29/22   Ines Onetha NOVAK, MD    Current Outpatient Medications  Medication Sig Dispense Refill   ALPRAZolam  (XANAX ) 0.25 MG tablet TAKE 1 TABLET(0.25 MG) BY MOUTH TWICE DAILY AS NEEDED FOR ANXIETY 20 tablet 0   Atogepant  (QULIPTA ) 60 MG TABS TAKE 1 TABLET BY MOUTH  DAILY 30 tablet 5   busPIRone  (BUSPAR ) 15 MG tablet Take 2 tablets (30 mg total) by mouth 2 (two) times daily. TAKE 2 TABLETS(30 MG) BY MOUTH TWICE DAILY 360 tablet 3   hydroxychloroquine  (PLAQUENIL ) 200 MG tablet TAKE 1 TABLET(200 MG) BY MOUTH TWICE DAILY 180 tablet 0   JUNEL FE 1/20 1-20 MG-MCG tablet Take 1 tablet by mouth every evening.     sertraline  (ZOLOFT ) 100 MG tablet Take 1 tablet (100 mg total) by mouth daily. 90 tablet 3   hydrOXYzine  (ATARAX ) 25 MG tablet TAKE 1 TABLET(25 MG) BY MOUTH AT BEDTIME AS NEEDED FOR ANXIETY 15 tablet 0    OnabotulinumtoxinA  (BOTOX  IJ) Inject as directed.     ondansetron  (ZOFRAN ) 4 MG tablet Take 1 tablet (4 mg total) by mouth every 8 (eight) hours as needed for nausea or vomiting. 20 tablet 3   polyethylene glycol powder (GLYCOLAX/MIRALAX) 17 GM/SCOOP powder Take by mouth once.     Ubrogepant  (UBRELVY ) 100 MG TABS TAKE 1 TABLET BY MOUTH AT ONSET OF MIGRAINE. MAY TAKE ANOTHER 2 HOUR LATER AS NEEDED 16 tablet 5   Current Facility-Administered Medications  Medication Dose Route Frequency Provider Last Rate Last Admin   0.9 %  sodium chloride  infusion  500 mL Intravenous Once Federico Rosario BROCKS, MD       botulinum toxin Type A  (BOTOX ) injection 155 Units  155 Units Intramuscular Once Ines Onetha NOVAK, MD        Allergies as of 10/08/2023 - Review Complete 10/08/2023  Allergen Reaction Noted   Depakote [divalproex sodium] Other (See Comments) 12/12/2021   Lactose Diarrhea 01/18/2019   Lactose intolerance (gi) Other (See Comments) 01/18/2019    Family History  Problem Relation Age of Onset   Depression Mother    Eating disorder Mother    Arthritis Father    Crohn's disease Father    Ulcerative colitis Father    Arthritis Brother    Alcohol abuse Brother    Mental illness Brother    Depression Brother    Anxiety disorder Brother    Crohn's disease Brother    Migraines Maternal Uncle    Breast cancer Maternal Grandmother    Mental illness Maternal Grandfather    Anxiety disorder Maternal Grandfather    OCD Maternal Grandfather    Heart disease Paternal Grandfather    Diabetes Paternal Grandfather    Heart attack Paternal Grandfather    Healthy Daughter    Healthy Son    Colon polyps Neg Hx    Colon cancer Neg Hx    Esophageal cancer Neg Hx    Rectal cancer Neg Hx    Stomach cancer Neg Hx     Social History   Socioeconomic History   Marital status: Married    Spouse name: Not on file   Number of children: 1   Years of education: Not on file   Highest education level: Master's  degree (e.g., MA, MS, MEng, MEd, MSW, MBA)  Occupational History   Not on file  Tobacco Use   Smoking status: Never    Passive exposure: Never   Smokeless tobacco: Never  Vaping Use   Vaping status: Never Used  Substance and Sexual Activity   Alcohol use: Not Currently   Drug use: Never   Sexual activity: Yes    Birth control/protection: Pill  Other Topics Concern   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  Home Situation: lives with husband and daughter      Spiritual Beliefs: Jewish      Lifestyle:  regular, aerobic and orange theory; diet healthy       Update: uses peloton 5x a week      Right handed      Caffeine: 1 cup/day   Social Drivers of Corporate investment banker Strain: Low Risk  (05/04/2021)   Overall Financial Resource Strain (CARDIA)    Difficulty of Paying Living Expenses: Not hard at all  Food Insecurity: No Food Insecurity (04/23/2022)   Hunger Vital Sign    Worried About Running Out of Food in the Last Year: Never true    Ran Out of Food in the Last Year: Never true  Transportation Needs: No Transportation Needs (04/23/2022)   PRAPARE - Administrator, Civil Service (Medical): No    Lack of Transportation (Non-Medical): No  Physical Activity: Insufficiently Active (05/04/2021)   Exercise Vital Sign    Days of Exercise per Week: 4 days    Minutes of Exercise per Session: 30 min  Stress: No Stress Concern Present (05/04/2021)   [employer] of Occupational Health - Occupational Stress Questionnaire    Feeling of Stress : Only a little  Social Connections: Socially Integrated (05/04/2021)   Social Connection and Isolation Panel    Frequency of Communication with Friends and Family: More than three times a week    Frequency of Social Gatherings with Friends and Family: More than three times a week    Attends Religious Services: 1 to 4 times per year    Active Member of [employer] or Organizations:  Yes    Attends Engineer, structural: More than 4 times per year    Marital Status: Married  Catering manager Violence: Not At Risk (04/23/2022)   Humiliation, Afraid, Rape, and Kick questionnaire    Fear of Current or Ex-Partner: No    Emotionally Abused: No    Physically Abused: No    Sexually Abused: No    Physical Exam: Vital signs in last 24 hours: BP 108/65   Pulse (!) 114   Temp 98.1 F (36.7 C) (Temporal)   Ht 5' 8 (1.727 m)   Wt 185 lb (83.9 kg)   SpO2 99%   BMI 28.13 kg/m  GEN: NAD EYE: Sclerae anicteric ENT: MMM CV: Non-tachycardic Pulm: No increased WOB GI: Soft NEURO:  Alert & Oriented   Estefana Kidney, MD [facility]   10/08/2023 11:04 AM

## 2023-10-08 NOTE — Progress Notes (Signed)
 Vss nad trans to pacu

## 2023-10-08 NOTE — Progress Notes (Signed)
 Called to room to assist during endoscopic procedure.  Patient ID and intended procedure confirmed with present staff. Received instructions for my participation in the procedure from the performing physician.

## 2023-10-08 NOTE — Patient Instructions (Signed)
-  Handout on hemorrhoids provided. -await pathology results. -repeat colonoscopy for surveillance recommended. Date to be determined when pathology result become available.  -Continue present medications.  YOU HAD AN ENDOSCOPIC PROCEDURE TODAY AT THE [facility]:   Refer to the procedure report that was given to you for any specific questions about what was found during the examination.  If the procedure report does not answer your questions, please call your gastroenterologist to clarify.  If you requested that your care partner not be given the details of your procedure findings, then the procedure report has been included in a sealed envelope for you to review at your convenience later.  YOU SHOULD EXPECT: Some feelings of bloating in the abdomen. Passage of more gas than usual.  Walking can help get rid of the air that was put into your GI tract during the procedure and reduce the bloating. If you had a lower endoscopy (such as a colonoscopy or flexible sigmoidoscopy) you may notice spotting of blood in your stool or on the toilet paper. If you underwent a bowel prep for your procedure, you may not have a normal bowel movement for a few days.  Please Note:  You might notice some irritation and congestion in your nose or some drainage.  This is from the oxygen used during your procedure.  There is no need for concern and it should clear up in a day or so.  SYMPTOMS TO REPORT IMMEDIATELY:  Following lower endoscopy (colonoscopy or flexible sigmoidoscopy):  Excessive amounts of blood in the stool  Significant tenderness or worsening of abdominal pains  Swelling of the abdomen that is new, acute  Fever of 100F or higher  For urgent or emergent issues, a gastroenterologist can be reached at any hour by calling [phone]. Do not use MyChart messaging for urgent concerns.    DIET:  We do recommend a small meal at first, but then you may proceed to your regular diet.  Drink  plenty of fluids but you should avoid alcoholic beverages for 24 hours.  ACTIVITY:  You should plan to take it easy for the rest of today and you should NOT DRIVE or use heavy machinery until tomorrow (because of the sedation medicines used during the test).    FOLLOW UP: Our staff will call the number listed on your records the next business day following your procedure.  We will call around 7:15- 8:00 am to check on you and address any questions or concerns that you may have regarding the information given to you following your procedure. If we do not reach you, we will leave a message.     If any biopsies were taken you will be contacted by phone or by letter within the next 1-3 weeks.  Please call us  at [phone] if you have not heard about the biopsies in 3 weeks.    SIGNATURES/CONFIDENTIALITY: You and/or your care partner have signed paperwork which will be entered into your electronic medical record.  These signatures attest to the fact that that the information above on your After Visit Summary has been reviewed and is understood.  Full responsibility of the confidentiality of this discharge information lies with you and/or your care-partner.

## 2023-10-08 NOTE — Op Note (Signed)
 [facility] Patient Name: Jennifer Vincent Procedure Date: 10/08/2023 11:42 AM MRN: [id number] Endoscopist: Rosario Estefana Kidney , , [id number] Age: 33 Referring MD:  Date of Birth: Dec 10, 1990 Gender: Female Account #: [id number] Procedure:                Colonoscopy Indications:              Rectal bleeding, Family history of ulcerative                            colitis in a first-degree relative, Family history                            of Crohn's disease in a first-degree relative Medicines:                Monitored Anesthesia Care Procedure:                Pre-Anesthesia Assessment:                           - Prior to the procedure, a History and Physical                            was performed, and patient medications and                            allergies were reviewed. The patient's tolerance of                            previous anesthesia was also reviewed. The risks                            and benefits of the procedure and the sedation                            options and risks were discussed with the patient.                            All questions were answered, and informed consent                            was obtained. Prior Anticoagulants: The patient has                            taken no anticoagulant or antiplatelet agents. ASA                            Grade Assessment: II - A patient with mild systemic                            disease. After reviewing the risks and benefits,                            the patient was deemed in satisfactory condition to  undergo the procedure.                           After obtaining informed consent, the colonoscope                            was passed under direct vision. Throughout the                            procedure, the patient's blood pressure, pulse, and                            oxygen saturations were monitored continuously. The                            CF [id number]  #[id number] was introduced through the anus                            and advanced to the the terminal ileum. The                            colonoscopy was performed without difficulty. The                            patient tolerated the procedure well. The quality                            of the bowel preparation was excellent. The                            terminal ileum, ileocecal valve, appendiceal                            orifice, and rectum were photographed. Scope In: 11:48:19 AM Scope Out: 12:12:16 PM Scope Withdrawal Time: 0 hours 18 minutes 44 seconds  Total Procedure Duration: 0 hours 23 minutes 57 seconds  Findings:                 The terminal ileum contained a few localized                            non-bleeding erosions. No stigmata of recent                            bleeding were seen. Biopsies were taken with a cold                            forceps for histology.                           Non-bleeding internal hemorrhoids were found during                            retroflexion. Complications:            No immediate complications. Estimated Blood  Loss:     Estimated blood loss was minimal. Impression:               - A few erosions in the terminal ileum. Biopsied.                           - Non-bleeding internal hemorrhoids. Recommendation:           - Discharge patient to home (with escort).                           - Await pathology results.                           - Anusol  HC cream BID for 7 days.                           - The findings and recommendations were discussed                            with the patient. Dr Estefana Federico Rosario Estefana Federico,  10/08/2023 12:17:20 PM

## 2023-10-09 ENCOUNTER — Telehealth: Payer: Self-pay

## 2023-10-09 NOTE — Telephone Encounter (Signed)
 Left message

## 2023-10-10 ENCOUNTER — Ambulatory Visit: Payer: Self-pay | Admitting: Internal Medicine

## 2023-10-10 LAB — SURGICAL PATHOLOGY

## 2023-10-14 ENCOUNTER — Telehealth: Payer: Self-pay | Admitting: Internal Medicine

## 2023-10-14 NOTE — Telephone Encounter (Signed)
 Good afternoon Dr. Suzann, I received a call from this patient earlier today requesting to transfer her care over to you. Patient stated that she has heard good recommendation of you from her friends and feels like you would actually be a better fit for her. Patient is requesting to schedule an appointment with you to discuss more about possible treatment plan for IBD. Dr. Federico did state that it was ok for her to transfer her care. Would you please advise on scheduling this patient.    Thank you.

## 2023-10-14 NOTE — Telephone Encounter (Signed)
 Good afternoon Dr. Federico, I received a call from this patient requesting to transfer her care over to Dr. Suzann. Patient stated that she has heard good recommendation of her from friends. Patient feels like Dr. Suzann would be more of a fix for here with it come to her IBD. Would you please advise.   Thank you.

## 2023-10-16 ENCOUNTER — Encounter: Payer: Self-pay | Admitting: Pediatrics

## 2023-10-16 ENCOUNTER — Other Ambulatory Visit: Payer: Self-pay

## 2023-10-17 ENCOUNTER — Other Ambulatory Visit: Payer: Self-pay

## 2023-10-17 DIAGNOSIS — Z8379 Family history of other diseases of the digestive system: Secondary | ICD-10-CM

## 2023-10-17 DIAGNOSIS — R197 Diarrhea, unspecified: Secondary | ICD-10-CM

## 2023-10-17 DIAGNOSIS — K5900 Constipation, unspecified: Secondary | ICD-10-CM

## 2023-10-17 DIAGNOSIS — R1084 Generalized abdominal pain: Secondary | ICD-10-CM

## 2023-10-17 DIAGNOSIS — R140 Abdominal distension (gaseous): Secondary | ICD-10-CM

## 2023-10-17 DIAGNOSIS — K625 Hemorrhage of anus and rectum: Secondary | ICD-10-CM

## 2023-10-21 ENCOUNTER — Other Ambulatory Visit: Payer: Self-pay

## 2023-10-22 ENCOUNTER — Other Ambulatory Visit: Payer: Self-pay

## 2023-10-22 ENCOUNTER — Other Ambulatory Visit

## 2023-10-22 DIAGNOSIS — R197 Diarrhea, unspecified: Secondary | ICD-10-CM

## 2023-10-22 DIAGNOSIS — R1084 Generalized abdominal pain: Secondary | ICD-10-CM

## 2023-10-22 DIAGNOSIS — Z8379 Family history of other diseases of the digestive system: Secondary | ICD-10-CM

## 2023-10-22 DIAGNOSIS — R140 Abdominal distension (gaseous): Secondary | ICD-10-CM | POA: Diagnosis not present

## 2023-10-22 DIAGNOSIS — K5900 Constipation, unspecified: Secondary | ICD-10-CM

## 2023-10-22 DIAGNOSIS — K625 Hemorrhage of anus and rectum: Secondary | ICD-10-CM

## 2023-10-25 ENCOUNTER — Encounter (HOSPITAL_COMMUNITY): Payer: Self-pay

## 2023-10-25 ENCOUNTER — Ambulatory Visit (HOSPITAL_COMMUNITY)
Admission: RE | Admit: 2023-10-25 | Discharge: 2023-10-25 | Disposition: A | Discharge status: Home / Self Care | Source: Ambulatory Visit | Attending: Pediatrics | Admitting: Pediatrics

## 2023-10-25 ENCOUNTER — Ambulatory Visit (HOSPITAL_COMMUNITY)

## 2023-10-25 DIAGNOSIS — K625 Hemorrhage of anus and rectum: Secondary | ICD-10-CM | POA: Diagnosis not present

## 2023-10-25 DIAGNOSIS — R197 Diarrhea, unspecified: Secondary | ICD-10-CM | POA: Insufficient documentation

## 2023-10-25 DIAGNOSIS — R140 Abdominal distension (gaseous): Secondary | ICD-10-CM | POA: Diagnosis not present

## 2023-10-25 DIAGNOSIS — Z8379 Family history of other diseases of the digestive system: Secondary | ICD-10-CM | POA: Insufficient documentation

## 2023-10-25 DIAGNOSIS — R1084 Generalized abdominal pain: Secondary | ICD-10-CM | POA: Insufficient documentation

## 2023-10-25 DIAGNOSIS — K5900 Constipation, unspecified: Secondary | ICD-10-CM | POA: Insufficient documentation

## 2023-10-25 MED ORDER — GADOBUTROL 1 MMOL/ML IV SOLN
8.0000 mL | Freq: Once | INTRAVENOUS | Status: AC | PRN
  Administered 2023-10-25: 8 mL via INTRAVENOUS

## 2023-10-27 ENCOUNTER — Telehealth: Payer: Self-pay | Admitting: Pediatrics

## 2023-10-27 NOTE — Telephone Encounter (Signed)
 Called and spoke with patient. I informed patient her radiology report has not returned yet, radiology told her it would take 24 hours. I told patient that radiology is sometimes delayed and I would give it at least a week. Once report has been reviewed by Dr. Suzann she will be notified. Patient also inquired about fecal calprotectin results, advised that is still pending as well. Patient is very anxious but I reassured her that we will be in touch once  results come back. Patient verbalized understanding and had no concerns at the end of the call.

## 2023-10-27 NOTE — Telephone Encounter (Signed)
 Inbound call from patient requesting a call regarding an update for MRI results. Advised patient it does not look like results have came back just yet. Requested to speak with nurse. Please advise, thank you

## 2023-10-29 ENCOUNTER — Encounter: Payer: Self-pay | Admitting: Pediatrics

## 2023-10-30 ENCOUNTER — Encounter: Payer: Self-pay | Admitting: Pediatrics

## 2023-10-30 ENCOUNTER — Telehealth: Payer: Self-pay | Admitting: Adult Health

## 2023-10-30 LAB — CALPROTECTIN: Calprotectin: 43 ug/g

## 2023-10-30 NOTE — Telephone Encounter (Signed)
 LVM and sent mychart msg informing pt of appointment change - NP schedule change

## 2023-10-30 NOTE — Progress Notes (Deleted)
 Office Visit Note  Patient: Jennifer Vincent             Date of Birth: 03-26-1990           MRN: [id number]             PCP: Kathrene Mardy HERO, PA-C Referring: Allwardt, Mardy HERO, PA-C Visit Date: 11/13/2023 Occupation: @GUAROCC @  Subjective:  No chief complaint on file.   History of Present Illness: Jennifer Vincent is a 33 y.o. female ***     Activities of Daily Living:  Patient reports morning stiffness for *** {minute/hour:19697}.   Patient {ACTIONS;DENIES/REPORTS:21021675::Denies} nocturnal pain.  Difficulty dressing/grooming: {ACTIONS;DENIES/REPORTS:21021675::Denies} Difficulty climbing stairs: {ACTIONS;DENIES/REPORTS:21021675::Denies} Difficulty getting out of chair: {ACTIONS;DENIES/REPORTS:21021675::Denies} Difficulty using hands for taps, buttons, cutlery, and/or writing: {ACTIONS;DENIES/REPORTS:21021675::Denies}  No Rheumatology ROS completed.   PMFS History:  Patient Active Problem List   Diagnosis Date Noted   Autoimmune disease (HCC) 01/29/2023   History of migraine 07/17/2022   Class 1 obesity due to excess calories without serious comorbidity with body mass index (BMI) of 32.0 to 32.9 in adult 07/17/2022   Family history of inflammatory bowel disease 07/17/2022   Unexplained night sweats 04/01/2022   Pain in pelvis 03/06/2022   Antiphospholipid syndrome (HCC) 10/02/2020   Intractable chronic migraine without aura and with status migrainosus 06/01/2019   Migraine 01/21/2019   Anxiety disorder 01/11/2018   Panic disorder 10/04/2016   Depressive disorder 07/01/2016    Past Medical History:  Diagnosis Date   Abnormal uterine bleeding (AUB)    Allergic rhinitis    Anxiety    Autoimmune disorder (HCC)    nonspecified   COVID 04/05/2020   COVID-19 09/29/2023   Covid positive 1/22     Frequent migraine    IBS (irritable bowel syndrome)    Migraine    PCOS (polycystic ovarian syndrome)    Vaginal delivery 09/29/2023   07/03/18, 39wga,IOL, F, 9#4oz,  [place]      Family History  Problem Relation Age of Onset   Depression Mother    Eating disorder Mother    Arthritis Father    Crohn's disease Father    Ulcerative colitis Father    Arthritis Brother    Alcohol abuse Brother    Mental illness Brother    Depression Brother    Anxiety disorder Brother    Crohn's disease Brother    Migraines Maternal Uncle    Breast cancer Maternal Grandmother    Mental illness Maternal Grandfather    Anxiety disorder Maternal Grandfather    OCD Maternal Grandfather    Heart disease Paternal Grandfather    Diabetes Paternal Grandfather    Heart attack Paternal Grandfather    Healthy Daughter    Healthy Son    Colon polyps Neg Hx    Colon cancer Neg Hx    Esophageal cancer Neg Hx    Rectal cancer Neg Hx    Stomach cancer Neg Hx    Past Surgical History:  Procedure Laterality Date   DILATATION & CURETTAGE/HYSTEROSCOPY WITH MYOSURE N/A 08/27/2022   Procedure: DILATATION & CURETTAGE/HYSTEROSCOPY;  Surgeon: Curlene Agent, MD;  Location: [facility];  Service: Gynecology;  Laterality: N/A;   TONSILLECTOMY  2011   VAGINAL DELIVERY     x 2   Social History   Social History Narrative   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      Home Situation: lives with husband and daughter      Spiritual Beliefs: Jewish  Lifestyle:  regular, aerobic and orange theory; diet healthy       Update: uses peloton 5x a week      Right handed      Caffeine: 1 cup/day   Immunization History  Administered Date(s) Administered   Influenza Inj Mdck Quad With Preservative 12/06/2021   Influenza, Mdck, Trivalent,PF 6+ MOS(egg free) 12/13/2022   Influenza,inj,Quad PF,6+ Mos 12/06/2021   Influenza-Unspecified 12/16/2016, 12/25/2017, 12/16/2018, 01/16/2021, 11/19/2021, 12/17/2022   PFIZER(Purple Top)SARS-COV-2 Vaccination 05/13/2019, 06/02/2019, 02/16/2020   PPD Test 10/13/2014   Pfizer(Comirnaty)Fall Seasonal Vaccine 12 years and older  12/13/2022     Objective: Vital Signs: There were no vitals taken for this visit.   Physical Exam   Musculoskeletal Exam: ***  CDAI Exam: CDAI Score: -- Patient Global: --; Provider Global: -- Swollen: --; Tender: -- Joint Exam 11/13/2023   No joint exam has been documented for this visit   There is currently no information documented on the homunculus. Go to the Rheumatology activity and complete the homunculus joint exam.  Investigation: No additional findings.  Imaging: MR ENTERO ABDOMEN W WO CONTRAST Result Date: 10/29/2023 CLINICAL DATA:  Evaluate for small bowel Crohn's disease, diarrhea, constipation, family history of inflammatory bowel disease, rectal bleeding EXAM: MR ABDOMEN AND PELVIS WITHOUT AND WITH CONTRAST (MR ENTEROGRAPHY) TECHNIQUE: Multiplanar, multisequence MRI of the abdomen and pelvis was performed both before and during bolus administration of intravenous contrast. Negative oral contrast VoLumen was given. CONTRAST:  8mL GADAVIST  GADOBUTROL  1 MMOL/ML IV SOLN COMPARISON:  CT abdomen pelvis, 02/10/2022 FINDINGS: COMBINED FINDINGS FOR BOTH MR ABDOMEN AND PELVIS Lower chest: No acute abnormality. Hepatobiliary: No solid liver abnormality is seen. No gallstones, gallbladder wall thickening, or biliary dilatation. Pancreas: Unremarkable. No pancreatic ductal dilatation or surrounding inflammatory changes. Spleen: Normal in size without significant abnormality. Adrenals/Urinary Tract: Adrenal glands are unremarkable. Kidneys are normal, without renal calculi, solid lesion, or hydronephrosis. Bladder is unremarkable. Stomach/Bowel: Stomach is within normal limits. Appendix not clearly visualized. No evidence of bowel wall thickening, distention, or inflammatory changes. Large burden of stool throughout the colon. Vascular/Lymphatic: No significant vascular findings are present. No enlarged abdominal or pelvic lymph nodes. Reproductive: No mass or other significant abnormality.  Numerous small bilateral ovarian follicles. Other: No abdominal wall hernia or abnormality. Trace free fluid in the low pelvis, likely functional in the reproductive age setting Musculoskeletal: No acute or significant osseous findings. IMPRESSION: 1. No MR evidence of inflammatory bowel disease. 2. Large burden of stool throughout the colon. 3. Trace free fluid in the low pelvis, likely functional in the reproductive age setting. Electronically Signed   By: Marolyn JONETTA Jaksch M.D.   On: 10/29/2023 21:58   MR ENTERO PELVIS W WO CONTRAST Result Date: 10/29/2023 CLINICAL DATA:  Evaluate for small bowel Crohn's disease, diarrhea, constipation, family history of inflammatory bowel disease, rectal bleeding EXAM: MR ABDOMEN AND PELVIS WITHOUT AND WITH CONTRAST (MR ENTEROGRAPHY) TECHNIQUE: Multiplanar, multisequence MRI of the abdomen and pelvis was performed both before and during bolus administration of intravenous contrast. Negative oral contrast VoLumen was given. CONTRAST:  8mL GADAVIST  GADOBUTROL  1 MMOL/ML IV SOLN COMPARISON:  CT abdomen pelvis, 02/10/2022 FINDINGS: COMBINED FINDINGS FOR BOTH MR ABDOMEN AND PELVIS Lower chest: No acute abnormality. Hepatobiliary: No solid liver abnormality is seen. No gallstones, gallbladder wall thickening, or biliary dilatation. Pancreas: Unremarkable. No pancreatic ductal dilatation or surrounding inflammatory changes. Spleen: Normal in size without significant abnormality. Adrenals/Urinary Tract: Adrenal glands are unremarkable. Kidneys are normal, without renal calculi, solid lesion, or  hydronephrosis. Bladder is unremarkable. Stomach/Bowel: Stomach is within normal limits. Appendix not clearly visualized. No evidence of bowel wall thickening, distention, or inflammatory changes. Large burden of stool throughout the colon. Vascular/Lymphatic: No significant vascular findings are present. No enlarged abdominal or pelvic lymph nodes. Reproductive: No mass or other significant  abnormality. Numerous small bilateral ovarian follicles. Other: No abdominal wall hernia or abnormality. Trace free fluid in the low pelvis, likely functional in the reproductive age setting Musculoskeletal: No acute or significant osseous findings. IMPRESSION: 1. No MR evidence of inflammatory bowel disease. 2. Large burden of stool throughout the colon. 3. Trace free fluid in the low pelvis, likely functional in the reproductive age setting. Electronically Signed   By: Marolyn JONETTA Jaksch M.D.   On: 10/29/2023 21:58    Recent Labs: Lab Results  Component Value Date   WBC 4.5 06/12/2023   HGB 15.1 06/12/2023   PLT 247 06/12/2023   NA 141 06/12/2023   K 4.5 06/12/2023   CL 104 06/12/2023   CO2 28 06/12/2023   GLUCOSE 60 (L) 06/12/2023   BUN 12 06/12/2023   CREATININE 0.81 06/12/2023   BILITOT 0.5 06/12/2023   ALKPHOS 22 (L) 11/22/2022   AST 18 06/12/2023   ALT 22 06/12/2023   PROT 7.2 06/12/2023   ALBUMIN 4.4 11/22/2022   CALCIUM 9.2 06/12/2023   GFRAA 119 07/29/2019   QFTBGOLDPLUS NEGATIVE 07/29/2019    Speciality Comments: PLQ eye exam: 10/22/2022 WNL. [facility]. Follow up in 1 year.  Procedures:  No procedures performed Allergies: Depakote [divalproex sodium], Lactose, and Lactose intolerance (gi)   Assessment / Plan:     Visit Diagnoses: Undifferentiated connective tissue disease (HCC)  High risk medication use  Lateral epicondylitis of both elbows  Chondromalacia of both patellae  Hypermobility of joint  Myofascial pain  Vitamin D  deficiency  Raynaud's syndrome without gangrene  Other fatigue  Vitamin B12 deficiency  Generalized anxiety disorder  Panic disorder  History of migraine  History of IBS  Family history of Crohn's disease  Family history of ulcerative colitis  Anticardiolipin antibody positive  Orders: No orders of the defined types were placed in this encounter.  No orders of the defined types were placed in this  encounter.   Face-to-face time spent with patient was *** minutes. Greater than 50% of time was spent in counseling and coordination of care.  Follow-Up Instructions: No follow-ups on file.   Waddell CHRISTELLA Craze, PA-C  Note - This record has been created using Dragon software.  Chart creation errors have been sought, but may not always  have been located. Such creation errors do not reflect on  the standard of medical care.

## 2023-11-13 ENCOUNTER — Ambulatory Visit: Admitting: Physician Assistant

## 2023-11-13 DIAGNOSIS — M79641 Pain in right hand: Secondary | ICD-10-CM

## 2023-11-13 DIAGNOSIS — M2242 Chondromalacia patellae, left knee: Secondary | ICD-10-CM

## 2023-11-13 DIAGNOSIS — Z79899 Other long term (current) drug therapy: Secondary | ICD-10-CM

## 2023-11-13 DIAGNOSIS — M249 Joint derangement, unspecified: Secondary | ICD-10-CM

## 2023-11-13 DIAGNOSIS — Z8669 Personal history of other diseases of the nervous system and sense organs: Secondary | ICD-10-CM

## 2023-11-13 DIAGNOSIS — E538 Deficiency of other specified B group vitamins: Secondary | ICD-10-CM

## 2023-11-13 DIAGNOSIS — E559 Vitamin D deficiency, unspecified: Secondary | ICD-10-CM

## 2023-11-13 DIAGNOSIS — F410 Panic disorder [episodic paroxysmal anxiety] without agoraphobia: Secondary | ICD-10-CM

## 2023-11-13 DIAGNOSIS — L659 Nonscarring hair loss, unspecified: Secondary | ICD-10-CM

## 2023-11-13 DIAGNOSIS — M7711 Lateral epicondylitis, right elbow: Secondary | ICD-10-CM

## 2023-11-13 DIAGNOSIS — I7300 Raynaud's syndrome without gangrene: Secondary | ICD-10-CM

## 2023-11-13 DIAGNOSIS — R5383 Other fatigue: Secondary | ICD-10-CM

## 2023-11-13 DIAGNOSIS — R760 Raised antibody titer: Secondary | ICD-10-CM

## 2023-11-13 DIAGNOSIS — F411 Generalized anxiety disorder: Secondary | ICD-10-CM

## 2023-11-13 DIAGNOSIS — M7918 Myalgia, other site: Secondary | ICD-10-CM

## 2023-11-13 DIAGNOSIS — Z8379 Family history of other diseases of the digestive system: Secondary | ICD-10-CM

## 2023-11-13 DIAGNOSIS — Z8719 Personal history of other diseases of the digestive system: Secondary | ICD-10-CM

## 2023-11-13 DIAGNOSIS — M359 Systemic involvement of connective tissue, unspecified: Secondary | ICD-10-CM

## 2023-11-17 NOTE — Progress Notes (Deleted)
 Office Visit Note  Patient: Jennifer Vincent             Date of Birth: Dec 17, 1990           MRN: [id number]             PCP: Kathrene Mardy HERO, PA-C Referring: Allwardt, Mardy HERO, PA-C Visit Date: 12/01/2023 Occupation: @GUAROCC @  Subjective:    History of Present Illness: Jennifer Vincent is a 33 y.o. female with history of undifferentiated connective tissue disease and myofascial pain.  Patient is currently taking plaquenil  200 mg 1 tablet by mouth twice daily.    CMP and CBC updated on 06/12/23. Orders for CBC and CMP released today.  PLQ eye exam: 10/22/2022 WNL. [facility]. Follow up in 1 year.    Activities of Daily Living:  Patient reports morning stiffness for *** {minute/hour:19697}.   Patient {ACTIONS;DENIES/REPORTS:21021675::Denies} nocturnal pain.  Difficulty dressing/grooming: {ACTIONS;DENIES/REPORTS:21021675::Denies} Difficulty climbing stairs: {ACTIONS;DENIES/REPORTS:21021675::Denies} Difficulty getting out of chair: {ACTIONS;DENIES/REPORTS:21021675::Denies} Difficulty using hands for taps, buttons, cutlery, and/or writing: {ACTIONS;DENIES/REPORTS:21021675::Denies}  No Rheumatology ROS completed.   PMFS History:  Patient Active Problem List   Diagnosis Date Noted   Autoimmune disease (HCC) 01/29/2023   History of migraine 07/17/2022   Class 1 obesity due to excess calories without serious comorbidity with body mass index (BMI) of 32.0 to 32.9 in adult 07/17/2022   Family history of inflammatory bowel disease 07/17/2022   Unexplained night sweats 04/01/2022   Pain in pelvis 03/06/2022   Antiphospholipid syndrome (HCC) 10/02/2020   Intractable chronic migraine without aura and with status migrainosus 06/01/2019   Migraine 01/21/2019   Anxiety disorder 01/11/2018   Panic disorder 10/04/2016   Depressive disorder 07/01/2016    Past Medical History:  Diagnosis Date   Abnormal uterine bleeding (AUB)    Allergic rhinitis    Anxiety    Autoimmune disorder  (HCC)    nonspecified   COVID 04/05/2020   COVID-19 09/29/2023   Covid positive 1/22     Frequent migraine    IBS (irritable bowel syndrome)    Migraine    PCOS (polycystic ovarian syndrome)    Vaginal delivery 09/29/2023   07/03/18, 39wga,IOL, F, 9#4oz, [place]      Family History  Problem Relation Age of Onset   Depression Mother    Eating disorder Mother    Arthritis Father    Crohn's disease Father    Ulcerative colitis Father    Arthritis Brother    Alcohol abuse Brother    Mental illness Brother    Depression Brother    Anxiety disorder Brother    Crohn's disease Brother    Migraines Maternal Uncle    Breast cancer Maternal Grandmother    Mental illness Maternal Grandfather    Anxiety disorder Maternal Grandfather    OCD Maternal Grandfather    Heart disease Paternal Grandfather    Diabetes Paternal Grandfather    Heart attack Paternal Grandfather    Healthy Daughter    Healthy Son    Colon polyps Neg Hx    Colon cancer Neg Hx    Esophageal cancer Neg Hx    Rectal cancer Neg Hx    Stomach cancer Neg Hx    Past Surgical History:  Procedure Laterality Date   DILATATION & CURETTAGE/HYSTEROSCOPY WITH MYOSURE N/A 08/27/2022   Procedure: DILATATION & CURETTAGE/HYSTEROSCOPY;  Surgeon: Curlene Agent, MD;  Location: [facility];  Service: Gynecology;  Laterality: N/A;   TONSILLECTOMY  2011   VAGINAL DELIVERY  x 2   Social History   Social History Narrative   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      Home Situation: lives with husband and daughter      Spiritual Beliefs: Jewish      Lifestyle:  regular, aerobic and orange theory; diet healthy       Update: uses peloton 5x a week      Right handed      Caffeine: 1 cup/day   Immunization History  Administered Date(s) Administered   Influenza Inj Mdck Quad With Preservative 12/06/2021   Influenza, Mdck, Trivalent,PF 6+ MOS(egg free) 12/13/2022   Influenza,inj,Quad PF,6+ Mos  12/06/2021   Influenza-Unspecified 12/16/2016, 12/25/2017, 12/16/2018, 01/16/2021, 11/19/2021, 12/17/2022   PFIZER(Purple Top)SARS-COV-2 Vaccination 05/13/2019, 06/02/2019, 02/16/2020   PPD Test 10/13/2014   Pfizer(Comirnaty)Fall Seasonal Vaccine 12 years and older 12/13/2022     Objective: Vital Signs: There were no vitals taken for this visit.   Physical Exam Vitals and nursing note reviewed.  Constitutional:      Appearance: She is well-developed.  HENT:     Head: Normocephalic and atraumatic.  Eyes:     Conjunctiva/sclera: Conjunctivae normal.  Cardiovascular:     Rate and Rhythm: Normal rate and regular rhythm.     Heart sounds: Normal heart sounds.  Pulmonary:     Effort: Pulmonary effort is normal.     Breath sounds: Normal breath sounds.  Abdominal:     General: Bowel sounds are normal.     Palpations: Abdomen is soft.  Musculoskeletal:     Cervical back: Normal range of motion.  Lymphadenopathy:     Cervical: No cervical adenopathy.  Skin:    General: Skin is warm and dry.     Capillary Refill: Capillary refill takes less than 2 seconds.  Neurological:     Mental Status: She is alert and oriented to person, place, and time.  Psychiatric:        Behavior: Behavior normal.      Musculoskeletal Exam: ***  CDAI Exam: CDAI Score: -- Patient Global: --; Provider Global: -- Swollen: --; Tender: -- Joint Exam 12/01/2023   No joint exam has been documented for this visit   There is currently no information documented on the homunculus. Go to the Rheumatology activity and complete the homunculus joint exam.  Investigation: No additional findings.  Imaging: MR ENTERO ABDOMEN W WO CONTRAST Result Date: 10/29/2023 CLINICAL DATA:  Evaluate for small bowel Crohn's disease, diarrhea, constipation, family history of inflammatory bowel disease, rectal bleeding EXAM: MR ABDOMEN AND PELVIS WITHOUT AND WITH CONTRAST (MR ENTEROGRAPHY) TECHNIQUE: Multiplanar, multisequence  MRI of the abdomen and pelvis was performed both before and during bolus administration of intravenous contrast. Negative oral contrast VoLumen was given. CONTRAST:  8mL GADAVIST  GADOBUTROL  1 MMOL/ML IV SOLN COMPARISON:  CT abdomen pelvis, 02/10/2022 FINDINGS: COMBINED FINDINGS FOR BOTH MR ABDOMEN AND PELVIS Lower chest: No acute abnormality. Hepatobiliary: No solid liver abnormality is seen. No gallstones, gallbladder wall thickening, or biliary dilatation. Pancreas: Unremarkable. No pancreatic ductal dilatation or surrounding inflammatory changes. Spleen: Normal in size without significant abnormality. Adrenals/Urinary Tract: Adrenal glands are unremarkable. Kidneys are normal, without renal calculi, solid lesion, or hydronephrosis. Bladder is unremarkable. Stomach/Bowel: Stomach is within normal limits. Appendix not clearly visualized. No evidence of bowel wall thickening, distention, or inflammatory changes. Large burden of stool throughout the colon. Vascular/Lymphatic: No significant vascular findings are present. No enlarged abdominal or pelvic lymph nodes. Reproductive: No mass or other significant abnormality. Numerous  small bilateral ovarian follicles. Other: No abdominal wall hernia or abnormality. Trace free fluid in the low pelvis, likely functional in the reproductive age setting Musculoskeletal: No acute or significant osseous findings. IMPRESSION: 1. No MR evidence of inflammatory bowel disease. 2. Large burden of stool throughout the colon. 3. Trace free fluid in the low pelvis, likely functional in the reproductive age setting. Electronically Signed   By: Marolyn JONETTA Jaksch M.D.   On: 10/29/2023 21:58   MR ENTERO PELVIS W WO CONTRAST Result Date: 10/29/2023 CLINICAL DATA:  Evaluate for small bowel Crohn's disease, diarrhea, constipation, family history of inflammatory bowel disease, rectal bleeding EXAM: MR ABDOMEN AND PELVIS WITHOUT AND WITH CONTRAST (MR ENTEROGRAPHY) TECHNIQUE: Multiplanar,  multisequence MRI of the abdomen and pelvis was performed both before and during bolus administration of intravenous contrast. Negative oral contrast VoLumen was given. CONTRAST:  8mL GADAVIST  GADOBUTROL  1 MMOL/ML IV SOLN COMPARISON:  CT abdomen pelvis, 02/10/2022 FINDINGS: COMBINED FINDINGS FOR BOTH MR ABDOMEN AND PELVIS Lower chest: No acute abnormality. Hepatobiliary: No solid liver abnormality is seen. No gallstones, gallbladder wall thickening, or biliary dilatation. Pancreas: Unremarkable. No pancreatic ductal dilatation or surrounding inflammatory changes. Spleen: Normal in size without significant abnormality. Adrenals/Urinary Tract: Adrenal glands are unremarkable. Kidneys are normal, without renal calculi, solid lesion, or hydronephrosis. Bladder is unremarkable. Stomach/Bowel: Stomach is within normal limits. Appendix not clearly visualized. No evidence of bowel wall thickening, distention, or inflammatory changes. Large burden of stool throughout the colon. Vascular/Lymphatic: No significant vascular findings are present. No enlarged abdominal or pelvic lymph nodes. Reproductive: No mass or other significant abnormality. Numerous small bilateral ovarian follicles. Other: No abdominal wall hernia or abnormality. Trace free fluid in the low pelvis, likely functional in the reproductive age setting Musculoskeletal: No acute or significant osseous findings. IMPRESSION: 1. No MR evidence of inflammatory bowel disease. 2. Large burden of stool throughout the colon. 3. Trace free fluid in the low pelvis, likely functional in the reproductive age setting. Electronically Signed   By: Marolyn JONETTA Jaksch M.D.   On: 10/29/2023 21:58    Recent Labs: Lab Results  Component Value Date   WBC 4.5 06/12/2023   HGB 15.1 06/12/2023   PLT 247 06/12/2023   NA 141 06/12/2023   K 4.5 06/12/2023   CL 104 06/12/2023   CO2 28 06/12/2023   GLUCOSE 60 (L) 06/12/2023   BUN 12 06/12/2023   CREATININE 0.81 06/12/2023    BILITOT 0.5 06/12/2023   ALKPHOS 22 (L) 11/22/2022   AST 18 06/12/2023   ALT 22 06/12/2023   PROT 7.2 06/12/2023   ALBUMIN 4.4 11/22/2022   CALCIUM 9.2 06/12/2023   GFRAA 119 07/29/2019   QFTBGOLDPLUS NEGATIVE 07/29/2019    Speciality Comments: PLQ eye exam: 10/22/2022 WNL. [facility]. Follow up in 1 year.  Procedures:  No procedures performed Allergies: Depakote [divalproex sodium], Lactose, and Lactose intolerance (gi)   Assessment / Plan:     Visit Diagnoses: Undifferentiated connective tissue disease (HCC)  High risk medication use  Pain in both hands  Lateral epicondylitis of both elbows  Chondromalacia of both patellae  Hypermobility of joint  Myofascial pain  Vitamin D  deficiency  Raynaud's syndrome without gangrene  Other fatigue  Vitamin B12 deficiency  Hair loss  Generalized anxiety disorder  Panic disorder  History of migraine  History of IBS  Family history of ulcerative colitis  Family history of Crohn's disease  Anticardiolipin antibody positive  History of shingles  Low serum alkaline phosphatase  Orders:  No orders of the defined types were placed in this encounter.  No orders of the defined types were placed in this encounter.   Face-to-face time spent with patient was *** minutes. Greater than 50% of time was spent in counseling and coordination of care.  Follow-Up Instructions: No follow-ups on file.   Waddell CHRISTELLA Craze, PA-C  Note - This record has been created using Dragon software.  Chart creation errors have been sought, but may not always  have been located. Such creation errors do not reflect on  the standard of medical care.

## 2023-11-17 NOTE — Progress Notes (Unsigned)
 11/18/23: Patient states overall Botox  is working really well for her.  She states that she has not had to use her preventative at all.  Only use over-the-counter Motrin .  She remains on Qulipta .  We normally do Toradol  after her Botox  procedure to prevent rebound headaches however she recently had a colonoscopy that showed some inflamed spots at the physician felt it was due to her ibuprofen  usage.  Patient has a follow-up in October with her GI physician.  She will discuss with her.   08/19/23: Patient reports that overall she feels that her migraines have remained stable.  She reports that she only had 3 migraines since the last Botox  injections.  She does get mild headaches as well.  She states that those have increased in the last couple of weeks.  But are manageable with over-the-counter medication.  She continues on Qulipta  and Ubrelvy .  She does get Toradol  before Botox  procedure to prevent rebound headaches after getting Botox .  Today we will give 30 mg IM to see if that is beneficial.  She denies any kidney disease.  05/14/23: only one migraine in 2025.  Remains on Qulipta  daily and Ubrelvy  for abortive therapy.  She is considering a third pregnancy but is not fully decided.  We did review medication options today.  Patient would like her masseters injected today.  Dr. Darleen came in and injected both masseters 5 units each.  Dr. Ines approved her getting Toradol  at her Botox  procedure to prevent a migraine from coming on after the injections.  She received Toradol  today  02/18/23: Botox  is working well.  No migraines in the month of November.  We did not inject masseters today we will reevaluate at the next visit.  11/27/22: reports that botox  has worked well. Only had 1 migraine in the last month. Dr. Ines has been doing botox  every 8 weeks. See has also been getting toradol  after botox  to prevent a migraine from coming after injections. Did not inject masseters today.    BOTOX  PROCEDURE NOTE  FOR MIGRAINE HEADACHE    Contraindications and precautions discussed with patient(above). Aseptic procedure was observed and patient tolerated procedure. Procedure performed by Duwaine Russell, NP  The condition has existed for more than 6 months, and pt does not have a diagnosis of ALS, Myasthenia Gravis or Lambert-Eaton Syndrome.  Risks and benefits of injections discussed and pt agrees to proceed with the procedure.  Written consent obtained  These injections do not cause sedations or hallucinations which the oral therapies may cause.  Indication/Diagnosis: chronic migraine BOTOX (G9414) injection was performed according to protocol by [employer]. 200 units of BOTOX  was dissolved into 4 cc NS.   NDC: [address]-98  Type of toxin: Botox  Botox - 200 units x 1 vial Lot: [id number] Expiration: 01/2026 NDC: 9976-6078-97   Bacteriostatic 0.9% Sodium Chloride - 4 mL  Lot: [id number] Expiration: 01/15/2025 NDC: 9590-8033-97   Dx: H56.288        Description of procedure:  The patient was placed in a sitting position. The standard protocol was used for Botox  as follows, with 5 units of Botox  injected at each site:   -Procerus muscle, midline injection  -Corrugator muscle, bilateral injection  -Frontalis muscle, bilateral injection, with 2 sites each side, medial injection was performed in the upper one third of the frontalis muscle, in the region vertical from the medial inferior edge of the superior orbital rim. The lateral injection was again in the upper one third of the forehead vertically above the lateral limbus  of the cornea, 1.5 cm lateral to the medial injection site.  -Temporalis muscle injection, 4 sites, bilaterally. The first injection was 3 cm above the tragus of the ear, second injection site was 1.5 cm to 3 cm up from the first injection site in line with the tragus of the ear. The third injection site was 1.5-3 cm forward between the first 2 injection sites. The fourth  injection site was 1.5 cm posterior to the second injection site.  -Occipitalis muscle injection, 3 sites, bilaterally. The first injection was done one half way between the occipital protuberance and the tip of the mastoid process behind the ear. The second injection site was done lateral and superior to the first, 1 fingerbreadth from the first injection. The third injection site was 1 fingerbreadth superiorly and medially from the first injection site.  -Cervical paraspinal muscle injection, 2 sites, bilateral knee first injection site was 1 cm from the midline of the cervical spine, 3 cm inferior to the lower border of the occipital protuberance. The second injection site was 1.5 cm superiorly and laterally to the first injection site.  -Trapezius muscle injection was performed at 3 sites, bilaterally. The first injection site was in the upper trapezius muscle halfway between the inflection point of the neck, and the acromion. The second injection site was one half way between the acromion and the first injection site. The third injection was done between the first injection site and the inflection point of the neck.     Will return for repeat injection in 3 months.   A 200 unit sof Botox  was used, 155 units were injected, the rest of the Botox  was wasted. The patient tolerated the procedure well, there were no complications of the above procedure.  Duwaine Russell, MSN, NP-C 11/17/2023, 9:39 AM [facility] [address] [place], [place] [address] [phone]

## 2023-11-18 ENCOUNTER — Ambulatory Visit (INDEPENDENT_AMBULATORY_CARE_PROVIDER_SITE_OTHER): Admitting: Adult Health

## 2023-11-18 ENCOUNTER — Ambulatory Visit: Admitting: Adult Health

## 2023-11-18 VITALS — BP 118/89 | HR 97

## 2023-11-18 DIAGNOSIS — G43711 Chronic migraine without aura, intractable, with status migrainosus: Secondary | ICD-10-CM | POA: Diagnosis not present

## 2023-11-18 MED ORDER — ONABOTULINUMTOXINA 200 UNITS IJ SOLR
155.0000 [IU] | Freq: Once | INTRAMUSCULAR | Status: AC
  Administered 2023-11-18: 155 [IU] via INTRAMUSCULAR

## 2023-11-18 NOTE — Progress Notes (Signed)
 Botox - 200 units x 1 vial Lot: [id number] Expiration: 01/2026 NDC: 9976-6078-97  Bacteriostatic 0.9% Sodium Chloride - 4 mL  Lot: [id number] Expiration: 01/15/2025 NDC: 9590-8033-97  Dx: H56.288 B/B Witnessed by Nena GRADE RN

## 2023-11-25 DIAGNOSIS — F4322 Adjustment disorder with anxiety: Secondary | ICD-10-CM | POA: Diagnosis not present

## 2023-12-01 ENCOUNTER — Ambulatory Visit: Admitting: Physician Assistant

## 2023-12-01 DIAGNOSIS — F410 Panic disorder [episodic paroxysmal anxiety] without agoraphobia: Secondary | ICD-10-CM

## 2023-12-01 DIAGNOSIS — Z8669 Personal history of other diseases of the nervous system and sense organs: Secondary | ICD-10-CM

## 2023-12-01 DIAGNOSIS — E559 Vitamin D deficiency, unspecified: Secondary | ICD-10-CM

## 2023-12-01 DIAGNOSIS — R5383 Other fatigue: Secondary | ICD-10-CM

## 2023-12-01 DIAGNOSIS — M249 Joint derangement, unspecified: Secondary | ICD-10-CM

## 2023-12-01 DIAGNOSIS — E538 Deficiency of other specified B group vitamins: Secondary | ICD-10-CM

## 2023-12-01 DIAGNOSIS — M7711 Lateral epicondylitis, right elbow: Secondary | ICD-10-CM

## 2023-12-01 DIAGNOSIS — R760 Raised antibody titer: Secondary | ICD-10-CM

## 2023-12-01 DIAGNOSIS — L659 Nonscarring hair loss, unspecified: Secondary | ICD-10-CM

## 2023-12-01 DIAGNOSIS — R748 Abnormal levels of other serum enzymes: Secondary | ICD-10-CM

## 2023-12-01 DIAGNOSIS — M7918 Myalgia, other site: Secondary | ICD-10-CM

## 2023-12-01 DIAGNOSIS — M2241 Chondromalacia patellae, right knee: Secondary | ICD-10-CM

## 2023-12-01 DIAGNOSIS — Z79899 Other long term (current) drug therapy: Secondary | ICD-10-CM

## 2023-12-01 DIAGNOSIS — I7300 Raynaud's syndrome without gangrene: Secondary | ICD-10-CM

## 2023-12-01 DIAGNOSIS — M79641 Pain in right hand: Secondary | ICD-10-CM

## 2023-12-01 DIAGNOSIS — Z8619 Personal history of other infectious and parasitic diseases: Secondary | ICD-10-CM

## 2023-12-01 DIAGNOSIS — M359 Systemic involvement of connective tissue, unspecified: Secondary | ICD-10-CM

## 2023-12-01 DIAGNOSIS — Z8379 Family history of other diseases of the digestive system: Secondary | ICD-10-CM

## 2023-12-01 DIAGNOSIS — F411 Generalized anxiety disorder: Secondary | ICD-10-CM

## 2023-12-01 DIAGNOSIS — Z8719 Personal history of other diseases of the digestive system: Secondary | ICD-10-CM

## 2023-12-10 DIAGNOSIS — F4322 Adjustment disorder with anxiety: Secondary | ICD-10-CM | POA: Diagnosis not present

## 2023-12-12 ENCOUNTER — Other Ambulatory Visit: Payer: Self-pay | Admitting: Physician Assistant

## 2023-12-12 ENCOUNTER — Other Ambulatory Visit: Payer: Self-pay | Admitting: Gastroenterology

## 2023-12-12 ENCOUNTER — Other Ambulatory Visit: Payer: Self-pay | Admitting: Rheumatology

## 2023-12-12 DIAGNOSIS — M359 Systemic involvement of connective tissue, unspecified: Secondary | ICD-10-CM

## 2023-12-12 DIAGNOSIS — L659 Nonscarring hair loss, unspecified: Secondary | ICD-10-CM

## 2023-12-12 NOTE — Telephone Encounter (Signed)
 Last Fill: 09/08/2023  Eye exam:  10/22/2022 WNL.   Labs: 06/12/2023 CBC stable.  Alk phos remains low but stable. Rest of CMP WNL.   ESR WNL  Vitamin B12 WNL  Folate WNL  Iron panel WNL-ferritin remains on lower end of normal-24 but has improved.  CRP WNL  Urine protein creatinine ratio WNL  Complements WNL   Next Visit: Due August 2025. Message sent to the front to schedule.   Last Visit: 06/12/2023  IK:Lwipqqzmzwupjuzi connective tissue disease   Current Dose per office note 06/12/2023: Plaquenil  200 mg 1 tablet by mouth twice daily   Attempted to contact the patient and left message to advise patient she is due for a follow up visit, labs and to update her eye exam.     Okay to refill Plaquenil ?

## 2023-12-15 ENCOUNTER — Encounter: Payer: Self-pay | Admitting: Physician Assistant

## 2023-12-15 ENCOUNTER — Ambulatory Visit: Attending: Physician Assistant | Admitting: Physician Assistant

## 2023-12-15 VITALS — BP 126/84 | HR 105 | Temp 97.6°F | Resp 14 | Ht 68.0 in | Wt 210.0 lb

## 2023-12-15 DIAGNOSIS — Z8719 Personal history of other diseases of the digestive system: Secondary | ICD-10-CM

## 2023-12-15 DIAGNOSIS — R760 Raised antibody titer: Secondary | ICD-10-CM

## 2023-12-15 DIAGNOSIS — I7300 Raynaud's syndrome without gangrene: Secondary | ICD-10-CM

## 2023-12-15 DIAGNOSIS — E538 Deficiency of other specified B group vitamins: Secondary | ICD-10-CM

## 2023-12-15 DIAGNOSIS — M359 Systemic involvement of connective tissue, unspecified: Secondary | ICD-10-CM

## 2023-12-15 DIAGNOSIS — Z8669 Personal history of other diseases of the nervous system and sense organs: Secondary | ICD-10-CM

## 2023-12-15 DIAGNOSIS — M79641 Pain in right hand: Secondary | ICD-10-CM

## 2023-12-15 DIAGNOSIS — M249 Joint derangement, unspecified: Secondary | ICD-10-CM

## 2023-12-15 DIAGNOSIS — Z79899 Other long term (current) drug therapy: Secondary | ICD-10-CM

## 2023-12-15 DIAGNOSIS — M7918 Myalgia, other site: Secondary | ICD-10-CM

## 2023-12-15 DIAGNOSIS — M7711 Lateral epicondylitis, right elbow: Secondary | ICD-10-CM

## 2023-12-15 DIAGNOSIS — Z8379 Family history of other diseases of the digestive system: Secondary | ICD-10-CM

## 2023-12-15 DIAGNOSIS — M7712 Lateral epicondylitis, left elbow: Secondary | ICD-10-CM

## 2023-12-15 DIAGNOSIS — R5383 Other fatigue: Secondary | ICD-10-CM

## 2023-12-15 DIAGNOSIS — M2241 Chondromalacia patellae, right knee: Secondary | ICD-10-CM

## 2023-12-15 DIAGNOSIS — E559 Vitamin D deficiency, unspecified: Secondary | ICD-10-CM

## 2023-12-15 DIAGNOSIS — M2242 Chondromalacia patellae, left knee: Secondary | ICD-10-CM

## 2023-12-15 DIAGNOSIS — F411 Generalized anxiety disorder: Secondary | ICD-10-CM

## 2023-12-15 DIAGNOSIS — M79642 Pain in left hand: Secondary | ICD-10-CM

## 2023-12-15 DIAGNOSIS — F410 Panic disorder [episodic paroxysmal anxiety] without agoraphobia: Secondary | ICD-10-CM

## 2023-12-15 NOTE — Progress Notes (Signed)
 Pharmacy Note  Subjective: Patient presents today to [facility] for follow up office visit. Patient seen by the pharmacist for counseling on azathioprine (Imuran) for CTD.  Previous therapy includes Plaquenil .  Objective: CMP     Component Value Date/Time   NA 141 06/12/2023 0818   K 4.5 06/12/2023 0818   CL 104 06/12/2023 0818   CO2 28 06/12/2023 0818   GLUCOSE 60 (L) 06/12/2023 0818   BUN 12 06/12/2023 0818   CREATININE 0.81 06/12/2023 0818   CALCIUM 9.2 06/12/2023 0818   PROT 7.2 06/12/2023 0818   ALBUMIN 4.4 11/22/2022 1437   AST 18 06/12/2023 0818   AST 19 11/22/2022 1437   ALT 22 06/12/2023 0818   ALT 18 11/22/2022 1437   ALKPHOS 22 (L) 11/22/2022 1437   BILITOT 0.5 06/12/2023 0818   BILITOT 0.4 11/22/2022 1437   GFRNONAA >60 11/22/2022 1437   GFRNONAA 103 07/29/2019 1652   GFRAA 119 07/29/2019 1652    CBC    Component Value Date/Time   WBC 4.5 06/12/2023 0818   RBC 4.78 06/12/2023 0818   HGB 15.1 06/12/2023 0818   HGB 14.6 11/22/2022 1437   HCT 45.6 (H) 06/12/2023 0818   PLT 247 06/12/2023 0818   PLT 256 11/22/2022 1437   MCV 95.4 06/12/2023 0818   MCH 31.6 06/12/2023 0818   MCHC 33.1 06/12/2023 0818   RDW 12.5 06/12/2023 0818   LYMPHSABS 2.3 11/22/2022 1437   MONOABS 0.4 11/22/2022 1437   EOSABS 50 06/12/2023 0818   BASOSABS 50 06/12/2023 0818    Baseline Immunosuppressant Therapy Labs TB GOLD    Latest Ref Rng & Units 07/29/2019    4:52 PM  Quantiferon TB Gold  Quantiferon TB Gold Plus NEGATIVE NEGATIVE    Hepatitis Panel    03/08/2020   12:00 AM  Hepatitis  Hep B Surface Ag Negative         This result is from an external source.   HIV Lab Results  Component Value Date   HIV Non Reactive 08/27/2022   HIV Non-reactive 03/08/2020   HIV NON-REACTIVE 07/29/2019   HIV Non-reactive 12/03/2017   Immunoglobulins    Latest Ref Rng & Units 07/29/2019    4:52 PM  Immunoglobulin Electrophoresis  IgA  47 - 310 mg/dL 808   IgG  399 - 8,359 mg/dL 8,737   IgM 50 - 699 mg/dL 90    SPEP    Latest Ref Rng & Units 06/12/2023    8:18 AM  Serum Protein Electrophoresis  Total Protein 6.1 - 8.1 g/dL 7.2    H3EI Lab Results  Component Value Date   G6PDH 13.9 01/26/2019   TPMT Lab Results  Component Value Date   TPMT 15 07/29/2019    Chest x-ray: 01/30/2009 No acute cardiopulmonary disease.   Assessment/Plan: Patient was counseled on the purpose, proper use, and adverse effects of azathioprine including risk of infection, nausea, rash, and hair loss. Reviewed risk of cancer after long term use.  Discussed risk of myelosupression and reviewed importance of frequent lab work to monitor blood counts.  Standing orders placed.  Reviewed drug-drug interactions including contraindication with allopurinol.  Provided patient with educational materials on azathioprine and answered all questions.  Patient consented to azathioprine.  Will upload consent into the media tab.   Patient does not drink alcohol.   Patient dose will be 50 mg daily.  Prescription pending GI appointment later this week.   Mesiah Manzo C. Charels [facility] PharmD Candidate  Class of 2026

## 2023-12-15 NOTE — Progress Notes (Signed)
 Office Visit Note  Patient: Ann Bohne             Date of Birth: [birth year]           MRN: [id number]             PCP: Kathrene Mardy HERO, PA-C Referring: Allwardt, Mardy HERO, PA-C Visit Date: 12/15/2023 Occupation: @GUAROCC @  Subjective:  Pain in multiple joints   History of Present Illness: Laquinda Moller is a 33 y.o. female with history of undifferentiated connective tissue disease and myofascial pain.  Patient is currently taking plaquenil  200 mg 1 tablet by mouth twice daily.  She is tolerating Plaquenil  without any side effects and has not had any recent gaps in therapy.  Patient continues to have chronic pain and stiffness involving multiple joints.  Patient had an upper respiratory viral infection last week which has seemed to exacerbate her symptoms of inflammatory arthritis.  Last week she was experiencing swelling in the left hand and continues to have ongoing discomfort in both elbows and both hands.  She experiences crepitus in her hands and knee joints on a daily basis.  She has been having to take ibuprofen  to help her sleep at night.  She has noticed increased nocturnal pain which has started to worry her.  Patient continues to have chronic fatigue on a daily basis.  Patient states that her hair loss has been stable since being under the care of dermatology.  She has been using topical Rogaine and has been taking an iron and vitamin D  supplement. Patient states that she underwent a colonoscopy on 10/08/2023 due to history of rectal bleeding.  Patient has a known family history of Crohn's-father and ulcerative colitis-brother.  Patient states that she will be following up with GI in 1 week to discuss colonoscopy results in the next steps.      Activities of Daily Living:  Patient reports morning stiffness for 10-15 minutes.   Patient Reports nocturnal pain.  Difficulty dressing/grooming: Denies Difficulty climbing stairs: Denies Difficulty getting out of chair: Denies Difficulty  using hands for taps, buttons, cutlery, and/or writing: Denies  Review of Systems  Constitutional:  Positive for fatigue.  HENT:  Negative for mouth sores and mouth dryness.   Eyes:  Positive for dryness.  Respiratory:  Negative for shortness of breath.   Cardiovascular:  Negative for chest pain and palpitations.  Gastrointestinal:  Positive for constipation and diarrhea. Negative for blood in stool.  Endocrine: Negative for increased urination.  Genitourinary:  Negative for involuntary urination.  Musculoskeletal:  Positive for joint pain, joint pain, myalgias, morning stiffness and myalgias. Negative for gait problem, joint swelling, muscle weakness and muscle tenderness.  Skin:  Positive for sensitivity to sunlight. Negative for color change, rash and hair loss.  Allergic/Immunologic: Positive for susceptible to infections.  Neurological:  Negative for dizziness and headaches.  Hematological:  Negative for swollen glands.  Psychiatric/Behavioral:  Negative for depressed mood and sleep disturbance. The patient is not nervous/anxious.     PMFS History:  Patient Active Problem List   Diagnosis Date Noted   Autoimmune disease 01/29/2023   History of migraine 07/17/2022   Class 1 obesity due to excess calories without serious comorbidity with body mass index (BMI) of 32.0 to 32.9 in adult 07/17/2022   Family history of inflammatory bowel disease 07/17/2022   Unexplained night sweats 04/01/2022   Pain in pelvis 03/06/2022   Antiphospholipid syndrome 10/02/2020   Intractable chronic migraine without aura and with status migrainosus  06/01/2019   Migraine 01/21/2019   Anxiety disorder 01/11/2018   Panic disorder 10/04/2016   Depressive disorder 07/01/2016    Past Medical History:  Diagnosis Date   Abnormal uterine bleeding (AUB)    Allergic rhinitis    Anxiety    Autoimmune disorder    nonspecified   COVID 04/05/2020   COVID-19 09/29/2023   Covid positive 1/22     Frequent  migraine    IBS (irritable bowel syndrome)    Migraine    PCOS (polycystic ovarian syndrome)    Vaginal delivery 09/29/2023   07/03/18, 39wga,IOL, F, 9#4oz, [place]      Family History  Problem Relation Age of Onset   Depression Mother    Eating disorder Mother    Arthritis Father    Crohn's disease Father    Ulcerative colitis Father    Arthritis Brother    Alcohol abuse Brother    Mental illness Brother    Depression Brother    Anxiety disorder Brother    Crohn's disease Brother    Migraines Maternal Uncle    Breast cancer Maternal Grandmother    Mental illness Maternal Grandfather    Anxiety disorder Maternal Grandfather    OCD Maternal Grandfather    Heart disease Paternal Grandfather    Diabetes Paternal Grandfather    Heart attack Paternal Grandfather    Healthy Daughter    Healthy Son    Colon polyps Neg Hx    Colon cancer Neg Hx    Esophageal cancer Neg Hx    Rectal cancer Neg Hx    Stomach cancer Neg Hx    Past Surgical History:  Procedure Laterality Date   DILATATION & CURETTAGE/HYSTEROSCOPY WITH MYOSURE N/A 08/27/2022   Procedure: DILATATION & CURETTAGE/HYSTEROSCOPY;  Surgeon: Curlene Agent, MD;  Location: [facility];  Service: Gynecology;  Laterality: N/A;   TONSILLECTOMY  2011   VAGINAL DELIVERY     x 2   Social History   Social History Narrative   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      Home Situation: lives with husband and daughter      Spiritual Beliefs: Jewish      Lifestyle:  regular, aerobic and orange theory; diet healthy       Update: uses peloton 5x a week      Right handed      Caffeine: 1 cup/day   Immunization History  Administered Date(s) Administered   Influenza Inj Mdck Quad With Preservative 12/06/2021   Influenza, Mdck, Trivalent,PF 6+ MOS(egg free) 12/13/2022   Influenza,inj,Quad PF,6+ Mos 12/06/2021   Influenza-Unspecified 12/16/2016, 12/25/2017, 12/16/2018, 01/16/2021, 11/19/2021, 12/17/2022    PFIZER(Purple Top)SARS-COV-2 Vaccination 05/13/2019, 06/02/2019, 02/16/2020   PPD Test 10/13/2014   Pfizer(Comirnaty)Fall Seasonal Vaccine 12 years and older 12/13/2022     Objective: Vital Signs: BP 126/84   Pulse (!) 105   Temp 97.6 F (36.4 C)   Resp 14   Ht 5' 8 (1.727 m)   Wt 210 lb (95.3 kg)   BMI 31.93 kg/m    Physical Exam Vitals and nursing note reviewed.  Constitutional:      Appearance: She is well-developed.  HENT:     Head: Normocephalic and atraumatic.  Eyes:     Conjunctiva/sclera: Conjunctivae normal.  Cardiovascular:     Rate and Rhythm: Normal rate and regular rhythm.     Heart sounds: Normal heart sounds.  Pulmonary:     Effort: Pulmonary effort is normal.     Breath  sounds: Normal breath sounds.  Abdominal:     General: Bowel sounds are normal.     Palpations: Abdomen is soft.  Musculoskeletal:     Cervical back: Normal range of motion.  Lymphadenopathy:     Cervical: No cervical adenopathy.  Skin:    General: Skin is warm and dry.     Capillary Refill: Capillary refill takes less than 2 seconds.  Neurological:     Mental Status: She is alert and oriented to person, place, and time.  Psychiatric:        Behavior: Behavior normal.      Musculoskeletal Exam: C-spine, thoracic spine, lumbar spine have good range of motion.  No midline spinal tenderness.  No SI joint tenderness.  Shoulder joints, elbow joints, wrist joints, MCPs, PIPs, DIPs have good range of motion with no synovitis.  Complete fist formation bilaterally.  Hip joints have good range of motion with no groin pain.  Knee joints have good range of motion no warmth or effusion.  Ankle joints have good range of motion no tenderness or joint swelling.  No evidence of Achilles tendinitis or plantar fasciitis.   CDAI Exam: CDAI Score: -- Patient Global: --; Provider Global: -- Swollen: --; Tender: -- Joint Exam 12/15/2023   No joint exam has been documented for this visit   There is  currently no information documented on the homunculus. Go to the Rheumatology activity and complete the homunculus joint exam.  Investigation: No additional findings.  Imaging: No results found.   Recent Labs: Lab Results  Component Value Date   WBC 4.5 06/12/2023   HGB 15.1 06/12/2023   PLT 247 06/12/2023   NA 141 06/12/2023   K 4.5 06/12/2023   CL 104 06/12/2023   CO2 28 06/12/2023   GLUCOSE 60 (L) 06/12/2023   BUN 12 06/12/2023   CREATININE 0.81 06/12/2023   BILITOT 0.5 06/12/2023   ALKPHOS 22 (L) 11/22/2022   AST 18 06/12/2023   ALT 22 06/12/2023   PROT 7.2 06/12/2023   ALBUMIN 4.4 11/22/2022   CALCIUM 9.2 06/12/2023   GFRAA 119 07/29/2019   QFTBGOLDPLUS NEGATIVE 07/29/2019    Speciality Comments: PLQ eye exam: 10/22/2022 WNL. [facility]. Follow up in 1 year.  Procedures:  No procedures performed Allergies: Depakote [divalproex sodium], Lactose, and Lactose intolerance (gi)   Assessment / Plan:     Visit Diagnoses: Undifferentiated connective tissue disease - Positive ANA, positive SCL 70, aCL 42, fatigue, inflammatory arthritis (pictures noted cell phone showed MCP, left ankle joint swelling and rash in the past): Patient presents today continuing to experience chronic pain, intermittent joint swelling, and chronic fatigue.  No oral or nasal ulcerations.  Chronic eye dryness.  She continues to have photosensitivity and wears sunscreen daily.  She remains under the care of dermatology for management of hair loss--taking vitamin D  and iron supplement.  She is also been using topical rogaine.  She is been experiencing increased nocturnal pain and had a recent flare involving the left hand.  On examination she has tenderness of several MCP and PIP joints as described above.  Patient continues to feel that she has more bad days than good days due to the severity of her arthralgias and fatigue. Of note the patient underwent a colonoscopy on 10/08/2023 which revealed a few  erosions in the terminal ileum.  Patient will be following up with GI next Monday to discuss results and the next steps.  The patient has a known family history of  IBD-father has UC and brother-Crohn's.   Different treatment options were discussed today in detail.  Reviewed indications, contraindications, and potential side effects of Imuran.  All questions were addressed and consent was obtained. Plan to initiate Imuran 50 mg 1 tablet by mouth daily in combination with Plaquenil  200 mg 1 tablet by mouth twice daily.  Patient plans on discussing the use of Imuran with GI prior to initiating therapy.  She will call us  once she is ready to start Imuran and a prescription will be sent to the pharmacy at that time.  She will notify us  if she cannot tolerate taking Imuran.  She will follow-up in the office in 8 weeks or sooner if needed. Plan: Protein / creatinine ratio, urine, CBC with Differential/Platelet, ANA, Anti-DNA antibody, double-stranded, C3 and C4, Sedimentation rate, VITAMIN D  25 Hydroxy (Vit-D Deficiency, Fractures), Comprehensive metabolic panel with GFR, Iron, TIBC and Ferritin Panel, TSH  Baseline Immunosuppressant Therapy Labs TB GOLD    Latest Ref Rng & Units 07/29/2019    4:52 PM  Quantiferon TB Gold  Quantiferon TB Gold Plus NEGATIVE NEGATIVE    Hepatitis Panel    03/08/2020   12:00 AM  Hepatitis  Hep B Surface Ag Negative         This result is from an external source.   HIV Lab Results  Component Value Date   HIV Non Reactive 08/27/2022   HIV Non-reactive 03/08/2020   HIV NON-REACTIVE 07/29/2019   HIV Non-reactive 12/03/2017   Immunoglobulins    Latest Ref Rng & Units 07/29/2019    4:52 PM  Immunoglobulin Electrophoresis  IgA  47 - 310 mg/dL 808   IgG 399 - 8,359 mg/dL 8,737   IgM 50 - 699 mg/dL 90    SPEP    Latest Ref Rng & Units 06/12/2023    8:18 AM  Serum Protein Electrophoresis  Total Protein 6.1 - 8.1 g/dL 7.2    H3EI Lab Results  Component Value  Date   G6PDH 13.9 01/26/2019   TPMT Lab Results  Component Value Date   TPMT 15 07/29/2019     Chest Xray: No acute cardiopulmonary disease 01/30/09  Patient was counseled on the purpose, proper use, and adverse effects of azathioprine including risk of infection, nausea, rash, and hair loss.  Reviewed risk of cancer after long term use.  Discussed risk of myelosupression and reviewed importance of frequent lab work to monitor blood counts.  Standing orders placed. Reviewed drug-drug interactions including contraindication with allopurinol.  Provided patient with educational materials on azathioprine and answered all questions.  Patient consented to azathioprine.  Will upload consent into the media tab.   Patient dose will be 50 mg daily.  Prescription will be sent to pharmacy pending lab results and insurance approval.  High risk medication use -Plaquenil  200 mg 1 tablet  by mouth twice daily.  Plan to add on Imuran 50 mg 1 tablet by mouth daily.  CMP and CBC updated on 06/12/23. Orders for CBC and CMP released today.  Her next lab work will be due in 1 month then every 3 months.  TPMT WNL on 07/29/19.    PLQ eye exam: 10/22/2022 WNL. [facility]. Follow up in 1 year.  Due to update Eye exam.   Plan: CBC with Differential/Platelet, Comprehensive metabolic panel with GFR  Pain in both hands: Patient continues to have chronic pain and stiffness involving both hands.  Last week she had a flare involving the left hand at which  time she was experiencing increased swelling.  She has tenderness over several MCP and PIP joints. She has been having to take ibuprofen  at bedtime due to experiencing increased nocturnal pain. Plan to add on Imuran as combination therapy as discussed above.  Lateral epicondylitis of both elbows: Chronic pain.  Chondromalacia of both patellae: Crepitus with range of motion of both knees.  Hypermobility of joint  Myofascial pain: Patient experiences intermittent  myalgias and muscle tenderness due to myofascial pain.  Vitamin D  deficiency -Plan to check vitamin D  today plan: VITAMIN D  25 Hydroxy (Vit-D Deficiency, Fractures)  Raynaud's syndrome without gangrene -No digital ulcerations or signs of gangrene.  Other fatigue: Chronic.  Plan to update the following lab work today for further evaluation.  Plan to initiate Imuran as combination therapy as discussed above.  Plan: Iron, TIBC and Ferritin Panel, TSH  Other medical conditions are listed as follows:   Vitamin B12 deficiency  Generalized anxiety disorder  Panic disorder  History of migraine  History of IBS  Family history of Crohn's disease  Family history of ulcerative colitis  Anticardiolipin antibody positive:Anticardiolipin antibodies were negative on 11/28/2020. Anticardiolipin antibodies remain negative on 01/30/2022.   Orders: Orders Placed This Encounter  Procedures   Protein / creatinine ratio, urine   CBC with Differential/Platelet   ANA   Anti-DNA antibody, double-stranded   C3 and C4   Sedimentation rate   VITAMIN D  25 Hydroxy (Vit-D Deficiency, Fractures)   Comprehensive metabolic panel with GFR   Iron, TIBC and Ferritin Panel   TSH   No orders of the defined types were placed in this encounter.    Follow-Up Instructions: Return in about 2 months (around 02/14/2024) for UCTD.   Waddell CHRISTELLA Craze, PA-C  Note - This record has been created using Dragon software.  Chart creation errors have been sought, but may not always  have been located. Such creation errors do not reflect on  the standard of medical care.

## 2023-12-16 ENCOUNTER — Ambulatory Visit: Payer: Self-pay | Admitting: Physician Assistant

## 2023-12-16 NOTE — Progress Notes (Signed)
 No proteinuria.   Hemoglobin and hematocrit are slightly elevated- we will continue to monitor.  Rest of CBC WNL.   Vitamin D  WNL TSH WNL ESR WNL CMP WNL Iron panel WNL

## 2023-12-16 NOTE — Progress Notes (Signed)
Complements within normal limits.

## 2023-12-17 LAB — IRON,TIBC AND FERRITIN PANEL
%SAT: 30 % (ref 16–45)
Ferritin: 30 ng/mL (ref 16–154)
Iron: 114 ug/dL (ref 40–190)
TIBC: 378 ug/dL (ref 250–450)

## 2023-12-17 LAB — PROTEIN / CREATININE RATIO, URINE
Creatinine, Urine: 20 mg/dL (ref 20–275)
Total Protein, Urine: <4 mg/dL — ABNORMAL LOW (ref 5–24)

## 2023-12-17 LAB — CBC WITH DIFFERENTIAL/PLATELET
Absolute Lymphocytes: 1984 {cells}/uL (ref 850–3900)
Absolute Monocytes: 325 {cells}/uL (ref 200–950)
Basophils Absolute: 70 {cells}/uL (ref 0–200)
Basophils Relative: 1.2 %
Eosinophils Absolute: 70 {cells}/uL (ref 15–500)
Eosinophils Relative: 1.2 %
HCT: 46.1 % — ABNORMAL HIGH (ref 35.0–45.0)
Hemoglobin: 15.8 g/dL — ABNORMAL HIGH (ref 11.7–15.5)
MCH: 32.2 pg (ref 27.0–33.0)
MCHC: 34.3 g/dL (ref 32.0–36.0)
MCV: 93.9 fL (ref 80.0–100.0)
MPV: 12.3 fL (ref 7.5–12.5)
Monocytes Relative: 5.6 %
Neutro Abs: 3352 {cells}/uL (ref 1500–7800)
Neutrophils Relative %: 57.8 %
Platelets: 262 Thousand/uL (ref 140–400)
RBC: 4.91 Million/uL (ref 3.80–5.10)
RDW: 11.8 % (ref 11.0–15.0)
Total Lymphocyte: 34.2 %
WBC: 5.8 Thousand/uL (ref 3.8–10.8)

## 2023-12-17 LAB — COMPREHENSIVE METABOLIC PANEL WITH GFR
AG Ratio: 1.6 (calc) (ref 1.0–2.5)
ALT: 26 U/L (ref 6–29)
AST: 24 U/L (ref 10–30)
Albumin: 4.5 g/dL (ref 3.6–5.1)
Alkaline phosphatase (APISO): 31 U/L (ref 31–125)
BUN: 15 mg/dL (ref 7–25)
CO2: 29 mmol/L (ref 20–32)
Calcium: 9.5 mg/dL (ref 8.6–10.2)
Chloride: 102 mmol/L (ref 98–110)
Creat: 0.81 mg/dL (ref 0.50–0.97)
Globulin: 2.9 g/dL (ref 1.9–3.7)
Glucose, Bld: 90 mg/dL (ref 65–99)
Potassium: 4.5 mmol/L (ref 3.5–5.3)
Sodium: 138 mmol/L (ref 135–146)
Total Bilirubin: 0.4 mg/dL (ref 0.2–1.2)
Total Protein: 7.4 g/dL (ref 6.1–8.1)
eGFR: 98 mL/min/1.73m2 (ref >=60)

## 2023-12-17 LAB — ANTI-DNA ANTIBODY, DOUBLE-STRANDED: ds DNA Ab: 1 [IU]/mL

## 2023-12-17 LAB — C3 AND C4
C3 Complement: 142 mg/dL (ref 83–193)
C4 Complement: 31 mg/dL (ref 15–57)

## 2023-12-17 LAB — ANTI-NUCLEAR AB-TITER (ANA TITER): ANA Titer 1: 1:320 {titer} — ABNORMAL HIGH

## 2023-12-17 LAB — TSH: TSH: 1.7 m[IU]/L

## 2023-12-17 LAB — VITAMIN D 25 HYDROXY (VIT D DEFICIENCY, FRACTURES): Vit D, 25-Hydroxy: 48 ng/mL (ref 30–100)

## 2023-12-17 LAB — SEDIMENTATION RATE: Sed Rate: 6 mm/h (ref 0–20)

## 2023-12-17 LAB — ANA: Anti Nuclear Antibody (ANA): POSITIVE — AB

## 2023-12-17 NOTE — Progress Notes (Signed)
 dsDNA is negative.  ANA is pending

## 2023-12-18 NOTE — Progress Notes (Signed)
 ANA remains positive and is a higher titer. Pattern unchanged.    Plan to initiate imuran 50 mg 1 tablet daily once the patient has been cleared by GI.

## 2023-12-18 NOTE — Progress Notes (Signed)
 ANA titer does not typically correlate with disease activity but is considered a true positive given that the titer is 1:320.

## 2023-12-21 NOTE — Progress Notes (Signed)
 [facility] Return Visit   Referring Provider Allwardt, Mardy HERO, PA-C [address] [place],  [place] [address]  Primary Care Provider Allwardt, Mardy HERO, PA-C  Patient Profile: Jennifer Vincent is a 33 y.o. female who returns to the [facility] for follow-up of the problem(s) noted below.  Problem List: IBS-C Bloating  History of Present Illness   Jennifer Vincent was last seen in the GI office 08/19/2022 by Alan Coombs, PA   Current GI Meds    Interval History   Discussed the use of AI scribe software for clinical note transcription with the patient, who gave verbal consent to proceed.  History of Present Illness Jennifer Vincent is a 33 year old female with a past medical history noteworthy for migraines, PCOS, anxiety, antiphospholipid antibody syndrome and obesity who returns to the gastroenterology office for follow-up of IBS-C and bloating.  IBS-C and bloating - Bloating, constipation, and periodic diarrhea present and have been longstanding - With Miralax and Colace, bowel movements improve to approximately three times per week, but sweating and shakiness occur during bowel movements. - Without laxatives, bowel movements decrease to one to two times per week, with hard and difficult-to-pass stools. - Linzess  previously improved symptoms before pregnancies but caused severe cramping when retried last year while on Wegovy . - Metamucil crackers used for additional fiber. - Occasional nausea present. - No acid reflux outside of pregnancy. - Blood in stool prompted initial colonoscopy  - Describes always feeling bloated and uncomfortable - Endorses nausea but no vomiting  - Colonoscopy 09/2023 disclosed nonspecific erosions in the terminal ileum, normal colon, hemorrhoids present - Biopsies with nonspecific active ileitis - Fecal calprotectin normal at 43 - MR enterography normal - Reviewed that current objective data does not support a diagnosis of  IBD at this time but she could be at risk in the future based upon family history  Dietary triggers and modifications - Diet includes overnight oats with chia seeds and protein powder for breakfast, vegetarian options such as salads with tofu or bean salads for lunch, and a protein with vegetables for dinner. - Beans and dairy exacerbate symptoms, causing increased bloating and gas. - No significant relief from dietary changes, including elimination of gluten and dairy. - No consistent food sensitivities identified, though dairy can worsen bloating.  Autoimmune disease - Unspecified autoimmune disease diagnosed. - On Plaquenil  since 2021. - Rheumatology currently considering a trial of Imuran -reviewed that there is no contraindication from a GI perspective but it may not necessarily mitigate GI symptoms since we have not identified inflammation.  Family history of inflammatory bowel disease - Father diagnosed with ulcerative colitis. - Younger brother diagnosed with Crohn's disease at age 76.   GI Review of Symptoms Significant for constipation, nausea, bloating. Otherwise negative.  General Review of Systems  Review of systems is significant for the pertinent positives and negatives as listed per the HPI.  Full ROS is otherwise negative.  Past Medical History   Past Medical History:  Diagnosis Date   Abnormal uterine bleeding (AUB)    Allergic rhinitis    Anxiety    Autoimmune disorder    nonspecified   COVID 04/05/2020   COVID-19 09/29/2023   Covid positive 1/22     Frequent migraine    IBS (irritable bowel syndrome)    Migraine    PCOS (polycystic ovarian syndrome)    Vaginal delivery 09/29/2023   07/03/18, 39wga,IOL, F, 9#4oz, Jennifer Vincent       Past Surgical History  Past Surgical History:  Procedure Laterality Date   DILATATION & CURETTAGE/HYSTEROSCOPY WITH MYOSURE N/A 08/27/2022   Procedure: DILATATION & CURETTAGE/HYSTEROSCOPY;  Surgeon: Curlene Agent, MD;   Location: [facility] [place];  Service: Gynecology;  Laterality: N/A;   TONSILLECTOMY  2011   VAGINAL DELIVERY     x 2     Allergies and Medications   Allergies  Allergen Reactions   Depakote [Divalproex Sodium] Other (See Comments)    Depression/suicidal thoughts   Lactose Diarrhea    GI upset   Lactose Intolerance (Gi) Other (See Comments)    GI upset   Current Meds  Medication Sig   ALPRAZolam  (XANAX ) 0.25 MG tablet TAKE 1 TABLET(0.25 MG) BY MOUTH TWICE DAILY AS NEEDED FOR ANXIETY   Atogepant  (QULIPTA ) 60 MG TABS TAKE 1 TABLET BY MOUTH DAILY   busPIRone  (BUSPAR ) 15 MG tablet Take 2 tablets (30 mg total) by mouth 2 (two) times daily. TAKE 2 TABLETS(30 MG) BY MOUTH TWICE DAILY   hydroxychloroquine  (PLAQUENIL ) 200 MG tablet TAKE 1 TABLET(200 MG) BY MOUTH TWICE DAILY   hydrOXYzine  (ATARAX ) 25 MG tablet TAKE 1 TABLET(25 MG) BY MOUTH AT BEDTIME AS NEEDED FOR ANXIETY (Patient taking differently: as needed.)   JUNEL FE 1/20 1-20 MG-MCG tablet Take 1 tablet by mouth every evening.   linaclotide  (LINZESS ) 145 MCG CAPS capsule Take 1 capsule daily.   OnabotulinumtoxinA  (BOTOX  IJ) Inject as directed.   ondansetron  (ZOFRAN ) 4 MG tablet Take 1 tablet (4 mg total) by mouth every 8 (eight) hours as needed for nausea or vomiting.   Peppermint Oil (IBGARD) 90 MG CPCR Take 2 capsules as directed.   sertraline  (ZOLOFT ) 100 MG tablet Take 1 tablet (100 mg total) by mouth daily.   Ubrogepant  (UBRELVY ) 100 MG TABS TAKE 1 TABLET BY MOUTH AT ONSET OF MIGRAINE. MAY TAKE ANOTHER 2 HOUR LATER AS NEEDED (Patient taking differently: as needed.)   VITAMIN D  PO Take by mouth.   Current Facility-Administered Medications for the 12/22/23 encounter (Office Visit) with Jennifer Vincent HERO, MD  Medication   botulinum toxin Type A  (BOTOX ) injection 155 Units     Family His   Family History  Problem Relation Age of Onset   Depression Mother    Eating disorder Mother    Arthritis Father    Crohn's  disease Father    Ulcerative colitis Father    Arthritis Brother    Alcohol abuse Brother    Mental illness Brother    Depression Brother    Anxiety disorder Brother    Crohn's disease Brother    Migraines Maternal Uncle    Breast cancer Maternal Grandmother    Mental illness Maternal Grandfather    Anxiety disorder Maternal Grandfather    OCD Maternal Grandfather    Heart disease Paternal Grandfather    Diabetes Paternal Grandfather    Heart attack Paternal Grandfather    Healthy Daughter    Healthy Son    Colon polyps Neg Hx    Colon cancer Neg Hx    Esophageal cancer Neg Hx    Rectal cancer Neg Hx    Stomach cancer Neg Hx     Social History   Social History   Tobacco Use   Smoking status: Never    Passive exposure: Never   Smokeless tobacco: Never  Vaping Use   Vaping status: Never Used  Substance Use Topics   Alcohol use: Not Currently   Drug use: Never   Jennifer Vincent reports that she has never  smoked. She has never been exposed to tobacco smoke. She has never used smokeless tobacco. She reports that she does not currently use alcohol. She reports that she does not use drugs.  Jennifer Vincent is married with 2 children Employed as a Interior and spatial designer at [employer]   Vital Signs and Physical Examination   Vitals:   12/22/23 0828  BP: 118/80  Pulse: 93   Body mass index is 31.63 kg/m. Weight: 208 lb (94.3 kg)  General: Well developed, well nourished, no acute distress Head: Normocephalic and atraumatic Eyes: Sclerae anicteric, EOMI Lungs: Clear throughout to auscultation Heart: Regular rate and rhythm; No murmurs, rubs or bruits Abdomen: Soft, tender in lower abdomen and non distended. No masses, hepatosplenomegaly or hernias noted. Normal Bowel sounds Rectal: Deferred Musculoskeletal: Symmetrical with no gross deformities     Review of Data   The following data was reviewed at the time of this encounter:   Laboratory Studies      Latest Ref Rng & Units  12/15/2023    1:18 PM 06/12/2023    8:18 AM 01/09/2023    9:25 AM  CBC  WBC 3.8 - 10.8 Thousand/uL 5.8  4.5  5.6   Hemoglobin 11.7 - 15.5 g/dL 84.1  84.8  84.8   Hematocrit 35.0 - 45.0 % 46.1  45.6  45.6   Platelets 140 - 400 Thousand/uL 262  247  259     No results found for: LIPASE    Latest Ref Rng & Units 12/15/2023    1:18 PM 06/12/2023    8:18 AM 01/09/2023    9:25 AM  CMP  Glucose 65 - 99 mg/dL 90  60  77   BUN 7 - 25 mg/dL 15  12  11    Creatinine 0.50 - 0.97 mg/dL 9.18  9.18  9.16   Sodium 135 - 146 mmol/L 138  141  140   Potassium 3.5 - 5.3 mmol/L 4.5  4.5  4.9   Chloride 98 - 110 mmol/L 102  104  104   CO2 20 - 32 mmol/L 29  28  27    Calcium 8.6 - 10.2 mg/dL 9.5  9.2  9.8   Total Protein 6.1 - 8.1 g/dL 7.4  7.2  7.3   Total Bilirubin 0.2 - 1.2 mg/dL 0.4  0.5  0.6   AST 10 - 30 U/L 24  18  22    ALT 6 - 29 U/L 26  22  19     Lab Results  Component Value Date   ESRSEDRATE 6 12/15/2023   Lab Results  Component Value Date   CRP <3.0 06/25/2023   12/2022 TTG IgA normal; total serum IgA normal in 2019  10/2023 fecal calprotectin 43  Imaging Studies  MR enterography 10/29/2023 1. No MR evidence of inflammatory bowel disease. 2. Large burden of stool throughout the colon. 3. Trace free fluid in the low pelvis, likely functional in the reproductive age setting.  CTAP 02/10/2022 1. No acute findings in the abdomen or pelvis. Specifically, no findings to explain the patient's history of pelvic pain. 2. 2 mm nonobstructing stone interpolar right kidney.    GI Procedures and Studies  Colonoscopy 10/08/2023 A few nonbleeding erosions in the terminal ileum Normal colon Internal hemorrhoids  Path: Focal nonspecific active ileitis with prominent Peyer's patches  Clinical Impression  It is my clinical impression that Jennifer Vincent is a 33 y.o. female with;  IBS-C Bloating  Jennifer Vincent returns to the office today after colonoscopy this summer  for investigation of symptoms of  constipation, bloating and rectal bleeding.  Her colonoscopy was overall reassuring.  It did show a few nonbleeding erosions in the terminal ileum but otherwise normal colonic mucosa in the presence of internal hemorrhoids which likely explains her previous rectal bleeding.  Biopsies of the terminal ileum yielded focal nonspecific active ileitis.  For further workup of Crohn's disease, fecal calprotectin was normal at 43 and MR enterography did not show any inflammatory changes.  Based upon this information I advised Jennifer Vincent that she does not have definitive evidence of inflammatory bowel disease at this time but could be at risk based upon her family history.  Jennifer Vincent's current constellation of symptoms seem most consistent with a form of IBS-C and associated bloating.  Reviewed that her bloating could be a sequelae of chronic constipation but may also represent a form of SIBO.  I have recommended reinitiation of Linzess  at a moderate dose of 145 mcg as she found this beneficial in the past.  I have also recommended SIBO testing with a breath test.   Plan  SIBO breath test -if positive treat with rifaximin Trial of Linzess  145 mcg p.o. daily -can further dose optimize or consider other agents such as Trulance, Motegrity, Ibsrela Trial of Ibgard Consider use of antispasmodics in the future such as hyoscyamine  or dicyclomine Consider low FODMAP diet Next screening colonoscopy due at age 77 or sooner should symptoms warrant   Planned Follow Up 2-3 months  The patient or caregiver verbalized understanding of the material covered, with no barriers to understanding. All questions were answered. Patient or caregiver is agreeable with the plan outlined above.    It was a pleasure to see Jennifer Vincent.  If you have any questions or concerns regarding this evaluation, do not hesitate to contact me.  Vincent Hausen, MD [facility]   I spent total of 30 minutes in both face-to-face (20 minutes interview)  and non-face-to-face (10 minutes chart review, care coordination, documentation)  activities, excluding procedures performed, for the visit on the date of this encounter.

## 2023-12-22 ENCOUNTER — Encounter: Payer: Self-pay | Admitting: Pediatrics

## 2023-12-22 ENCOUNTER — Ambulatory Visit: Admitting: Pediatrics

## 2023-12-22 VITALS — BP 118/80 | HR 93 | Ht 68.0 in | Wt 208.0 lb

## 2023-12-22 DIAGNOSIS — K581 Irritable bowel syndrome with constipation: Secondary | ICD-10-CM

## 2023-12-22 DIAGNOSIS — R140 Abdominal distension (gaseous): Secondary | ICD-10-CM | POA: Diagnosis not present

## 2023-12-22 DIAGNOSIS — K589 Irritable bowel syndrome without diarrhea: Secondary | ICD-10-CM

## 2023-12-22 MED ORDER — IBGARD 90 MG PO CPCR: ORAL_CAPSULE | ORAL | 0 refills | Status: AC

## 2023-12-22 MED ORDER — LINACLOTIDE 145 MCG PO CAPS
ORAL_CAPSULE | ORAL | 0 refills | Status: AC
Start: 2023-12-22 — End: ?

## 2023-12-22 NOTE — Patient Instructions (Signed)
 You have been given a testing kit to check for small intestine bacterial overgrowth (SIBO) which is completed by a company named [facility]. Make sure to return your test in the mail using the return mailing label given to you along with the kit. The test order, your demographic and insurance information have all already been sent to the company. [facility] will collect an upfront charge of $109.00 for commercial insurance plans and $229.00 if you are paying cash. The potential remaining total after claim submission and review is $120.00. Make sure to discuss with [facility] PRIOR to having the test to see if they have gotten information from your insurance company as to how much your testing will cost out of pocket, if any. Please contact [facility] at phone number [phone] to get instructions regarding how to perform the test as our office is unable to give specific testing instructions.    We have given you samples of the following medication to take: Linzess  145 mcg, take 1 capsule 20-30 minutes before breakfast.  IBgard, take 2 capsules as needed.   Thank you for entrusting me with your care and for choosing [facility], Dr. Inocente Hausen  _______________________________________________________  If your blood pressure at your visit was 140/90 or greater, please contact your primary care physician to follow up on this.  _______________________________________________________  If you are age 34 or older, your body mass index should be between 23-30. Your Body mass index is 31.63 kg/m. If this is out of the aforementioned range listed, please consider follow up with your Primary Care Provider.  If you are age 1 or younger, your body mass index should be between 19-25. Your Body mass index is 31.63 kg/m. If this is out of the aformentioned range listed, please consider follow up with your Primary Care Provider.    ________________________________________________________  The [facility] providers would like to encourage you to use MYCHART to communicate with providers for non-urgent requests or questions.  Due to long hold times on the telephone, sending your provider a message by [facility] may be a faster and more efficient way to get a response.  Please allow 48 business hours for a response.  Please remember that this is for non-urgent requests.  _______________________________________________________  [facility] is using a team-based approach to care.  Your team is made up of your doctor and two to three APPS. Our APPS (Nurse Practitioners and Physician Assistants) work with your physician to ensure care continuity for you. They are fully qualified to address your health concerns and develop a treatment plan. They communicate directly with your gastroenterologist to care for you. Seeing the Advanced Practice Practitioners on your physician's team can help you by facilitating care more promptly, often allowing for earlier appointments, access to diagnostic testing, procedures, and other specialty referrals.

## 2023-12-25 ENCOUNTER — Encounter: Payer: Self-pay | Admitting: Pediatrics

## 2023-12-26 ENCOUNTER — Other Ambulatory Visit: Payer: Self-pay | Admitting: Medical Genetics

## 2023-12-26 DIAGNOSIS — Z006 Encounter for examination for normal comparison and control in clinical research program: Secondary | ICD-10-CM

## 2023-12-26 NOTE — Telephone Encounter (Signed)
 Per lab note on 12/16/2023, Plan to initiate imuran 50 mg 1 tablet daily once the patient has been cleared by GI.   Per GI note on 12/22/2023, Rheumatology currently considering a trial of Imuran -reviewed that there is no contraindication from a GI perspective but it may not necessarily mitigate GI symptoms since we have not identified inflammation.   Please advise.

## 2023-12-29 MED ORDER — AZATHIOPRINE 50 MG PO TABS: 50.0000 mg | ORAL_TABLET | Freq: Every day | ORAL | 0 refills | Status: DC

## 2023-12-29 NOTE — Telephone Encounter (Signed)
 Please review and sign pended imuran prescription. Thanks!

## 2024-01-02 ENCOUNTER — Ambulatory Visit: Admitting: Physician Assistant

## 2024-01-06 DIAGNOSIS — F4322 Adjustment disorder with anxiety: Secondary | ICD-10-CM | POA: Diagnosis not present

## 2024-01-18 ENCOUNTER — Other Ambulatory Visit: Payer: Self-pay | Admitting: Rheumatology

## 2024-01-18 DIAGNOSIS — L659 Nonscarring hair loss, unspecified: Secondary | ICD-10-CM

## 2024-01-18 DIAGNOSIS — M359 Systemic involvement of connective tissue, unspecified: Secondary | ICD-10-CM

## 2024-01-19 NOTE — Telephone Encounter (Signed)
 Last Fill: 12/12/2023 (30 day supply)  Eye exam:  10/22/2022 WNL.   Labs: 12/15/2023 Hemoglobin and hematocrit are slightly elevated- we will continue to monitor. Rest of CBC WNL. CMP WNL   Next Visit: 02/19/2024  Last Visit: 12/15/2023  IK:Lwipqqzmzwupjuzi connective tissue disease   Current Dose per office note 12/15/2023: Plaquenil  200 mg 1 tablet by mouth twice daily.   Left message to advise patient she is due to update her PLQ eye exam.   Okay to refill Plaquenil ?

## 2024-01-20 DIAGNOSIS — F4322 Adjustment disorder with anxiety: Secondary | ICD-10-CM | POA: Diagnosis not present

## 2024-01-21 ENCOUNTER — Telehealth: Payer: Self-pay | Admitting: Adult Health

## 2024-01-21 DIAGNOSIS — G43711 Chronic migraine without aura, intractable, with status migrainosus: Secondary | ICD-10-CM

## 2024-01-21 NOTE — Telephone Encounter (Signed)
 Completed [employer] PA to switch pt to [facility], signed by Megan. Faxed along with notes to [phone].

## 2024-01-22 MED ORDER — ONABOTULINUMTOXINA 200 UNITS IJ SOLR: INTRAMUSCULAR | 2 refills | Status: AC

## 2024-01-22 NOTE — Telephone Encounter (Signed)
 Received approval, please send rx to [facility].  Auth#: [id number] (01/21/24-12/22/24)

## 2024-01-22 NOTE — Telephone Encounter (Signed)
 Rx sent in

## 2024-01-22 NOTE — Addendum Note (Signed)
 Addended by: JOSHUA MAURILIO CROME on: 01/22/2024 02:14 PM   Modules accepted: Orders

## 2024-01-26 ENCOUNTER — Encounter: Payer: Self-pay | Admitting: Pediatrics

## 2024-01-28 ENCOUNTER — Other Ambulatory Visit: Payer: Self-pay | Admitting: Physician Assistant

## 2024-01-28 NOTE — Telephone Encounter (Signed)
 Last Fill: 12/29/2023 (30 day supply)  Labs: 12/15/2023 Hemoglobin and hematocrit are slightly elevated- we will continue to monitor.  Rest of CBC WNL.   Vitamin D  WNL TSH WNL ESR WNL CMP WNL Iron panel WNL  Next Visit: 02/19/2024  Last Visit: 12/15/2023  DX: Undifferentiated connective tissue disease   Current Dose per office note 12/15/2023: Imuran 50 mg 1 tablet by mouth daily   Okay to refill Imuran?

## 2024-01-28 NOTE — Telephone Encounter (Signed)
 Last Fill: 12/29/2023  Labs: 12/15/2023 No proteinuria.   Hemoglobin and hematocrit are slightly elevated- we will continue to monitor.  Rest of CBC WNL.   Vitamin D  WNL TSH WNL ESR WNL CMP WNL Iron panel WNL Complements within normal limits.  dsDNA is negative  ANA remains positive and is a higher titer. Pattern unchanged.    Plan to initiate imuran 50 mg 1 tablet daily once the patient has been cleared by GI.  Next Visit: 02/19/2024  Last Visit: 12/15/2023  DX: Undifferentiated connective tissue disease   Current Dose per office note 12/15/2023: Plan to add on Imuran 50 mg 1 tablet by mouth daily   Okay to refill Imuran?

## 2024-02-03 DIAGNOSIS — W228XXA Striking against or struck by other objects, initial encounter: Secondary | ICD-10-CM | POA: Diagnosis not present

## 2024-02-03 DIAGNOSIS — S90122A Contusion of left lesser toe(s) without damage to nail, initial encounter: Secondary | ICD-10-CM | POA: Diagnosis not present

## 2024-02-03 DIAGNOSIS — M79675 Pain in left toe(s): Secondary | ICD-10-CM | POA: Diagnosis not present

## 2024-02-03 DIAGNOSIS — S92535A Nondisplaced fracture of distal phalanx of left lesser toe(s), initial encounter for closed fracture: Secondary | ICD-10-CM | POA: Diagnosis not present

## 2024-02-05 NOTE — Progress Notes (Unsigned)
 Office Visit Note  Patient: Jennifer Vincent             Date of Birth: [birth year]           MRN: [id number]             PCP: Kathrene Mardy HERO, PA-C Referring: Allwardt, Mardy HERO, PA-C Visit Date: 02/19/2024 Occupation: Data Unavailable  Subjective:  Medication monitoring   History of Present Illness: Jennifer Vincent is a 33 y.o. female with history of undifferentiated connective tissue disease.  Patient remains on Plaquenil  200 mg 1 tablet by mouth twice daily and imuran  50 mg 1 tablet by mouth daily.  Patient was started on Imuran  in mid October 2025.  She is tolerating Imuran  without any side effects but has not yet noticed any clinical benefit.  Patient continues to have chronic pain and stiffness affecting multiple joints.  She denies any new joint involvement but has not noticed any improvement in the chronic arthralgias and joint stiffness she was experiencing.  Patient states that she has noticed some improvement in her energy levels.  Patient states that her GI symptoms have also improved.  She denies any recent rashes.  Denies any oral or nasal ulcerations.  She continues to have intermittent symptoms of Raynaud's phenomenon.  She denies any new medical conditions.    Component     Latest Ref Rng 12/15/2023  C3 Complement     83 - 193 mg/dL 857   C4 Complement     15 - 57 mg/dL 31   ds DNA Ab     IU/mL 1   Sed Rate     0 - 20 mm/h 6      Activities of Daily Living:  Patient reports morning stiffness for 10 minutes.   Patient Reports nocturnal pain.  Difficulty dressing/grooming: Denies Difficulty climbing stairs: Denies Difficulty getting out of chair: Denies Difficulty using hands for taps, buttons, cutlery, and/or writing: Denies  Review of Systems  Constitutional:  Positive for fatigue.  HENT:  Negative for mouth sores and mouth dryness.   Eyes:  Positive for dryness.  Respiratory:  Negative for shortness of breath.   Cardiovascular:  Negative for chest pain and  palpitations.  Gastrointestinal:  Negative for blood in stool, constipation and diarrhea.  Endocrine: Negative for increased urination.  Genitourinary:  Negative for involuntary urination.  Musculoskeletal:  Positive for joint pain, joint pain, joint swelling and morning stiffness. Negative for gait problem, myalgias, muscle weakness, muscle tenderness and myalgias.  Skin:  Positive for color change and sensitivity to sunlight. Negative for rash and hair loss.  Allergic/Immunologic: Positive for susceptible to infections.  Neurological:  Negative for dizziness and headaches.  Hematological:  Positive for swollen glands.  Psychiatric/Behavioral:  Negative for depressed mood and sleep disturbance. The patient is not nervous/anxious.     PMFS History:  Patient Active Problem List   Diagnosis Date Noted   Autoimmune disease 01/29/2023   History of migraine 07/17/2022   Class 1 obesity due to excess calories without serious comorbidity with body mass index (BMI) of 32.0 to 32.9 in adult 07/17/2022   Family history of inflammatory bowel disease 07/17/2022   Unexplained night sweats 04/01/2022   Pain in pelvis 03/06/2022   Antiphospholipid syndrome 10/02/2020   Intractable chronic migraine without aura and with status migrainosus 06/01/2019   Migraine 01/21/2019   Anxiety disorder 01/11/2018   Panic disorder 10/04/2016   Depressive disorder 07/01/2016    Past Medical History:  Diagnosis Date  Abnormal uterine bleeding (AUB)    Allergic rhinitis    Anxiety    Autoimmune disorder    nonspecified   COVID 04/05/2020   COVID-19 09/29/2023   Covid positive 1/22     Frequent migraine    IBS (irritable bowel syndrome)    Migraine    PCOS (polycystic ovarian syndrome)    Vaginal delivery 09/29/2023   07/03/18, 39wga,IOL, F, 9#4oz, [place]      Family History  Problem Relation Age of Onset   Depression Mother    Eating disorder Mother    Arthritis Father    Crohn's disease Father     Ulcerative colitis Father    Arthritis Brother    Alcohol abuse Brother    Mental illness Brother    Depression Brother    Anxiety disorder Brother    Crohn's disease Brother    Migraines Maternal Uncle    Breast cancer Maternal Grandmother    Mental illness Maternal Grandfather    Anxiety disorder Maternal Grandfather    OCD Maternal Grandfather    Heart disease Paternal Grandfather    Diabetes Paternal Grandfather    Heart attack Paternal Grandfather    Healthy Daughter    Healthy Son    Colon polyps Neg Hx    Colon cancer Neg Hx    Esophageal cancer Neg Hx    Rectal cancer Neg Hx    Stomach cancer Neg Hx    Past Surgical History:  Procedure Laterality Date   DILATATION & CURETTAGE/HYSTEROSCOPY WITH MYOSURE N/A 08/27/2022   Procedure: DILATATION & CURETTAGE/HYSTEROSCOPY;  Surgeon: Curlene Agent, MD;  Location: [facility];  Service: Gynecology;  Laterality: N/A;   TONSILLECTOMY  2011   VAGINAL DELIVERY     x 2   Social History   Tobacco Use   Smoking status: Never    Passive exposure: Never   Smokeless tobacco: Never  Vaping Use   Vaping status: Never Used  Substance Use Topics   Alcohol use: Not Currently   Drug use: Never   Social History   Social History Narrative   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      Home Situation: lives with husband and daughter      Spiritual Beliefs: Jewish      Lifestyle:  regular, aerobic and orange theory; diet healthy       Update: uses peloton 5x a week      Right handed      Caffeine: 1 cup/day     Immunization History  Administered Date(s) Administered   Influenza Inj Mdck Quad With Preservative 12/06/2021   Influenza, Mdck, Trivalent,PF 6+ MOS(egg free) 12/13/2022   Influenza,inj,Quad PF,6+ Mos 12/06/2021   Influenza-Unspecified 12/16/2016, 12/25/2017, 12/16/2018, 01/16/2021, 11/19/2021, 12/17/2022   PFIZER(Purple Top)SARS-COV-2 Vaccination 05/13/2019, 06/02/2019, 02/16/2020   PPD Test  10/13/2014   Pfizer(Comirnaty)Fall Seasonal Vaccine 12 years and older 12/13/2022     Objective: Vital Signs: BP 136/85   Pulse 92   Temp 97.9 F (36.6 C)   Resp 15   Ht 5' 8 (1.727 m)   Wt 210 lb 9.6 oz (95.5 kg)   BMI 32.02 kg/m    Physical Exam Vitals and nursing note reviewed.  Constitutional:      Appearance: She is well-developed.  HENT:     Head: Normocephalic and atraumatic.  Eyes:     Conjunctiva/sclera: Conjunctivae normal.  Cardiovascular:     Rate and Rhythm: Normal rate and regular rhythm.     Heart  sounds: Normal heart sounds.  Pulmonary:     Effort: Pulmonary effort is normal.     Breath sounds: Normal breath sounds.  Abdominal:     General: Bowel sounds are normal.     Palpations: Abdomen is soft.  Musculoskeletal:     Cervical back: Normal range of motion.  Lymphadenopathy:     Cervical: No cervical adenopathy.  Skin:    General: Skin is warm and dry.     Capillary Refill: Capillary refill takes less than 2 seconds.  Neurological:     Mental Status: She is alert and oriented to person, place, and time.  Psychiatric:        Behavior: Behavior normal.      Musculoskeletal Exam: C-spine, thoracic spine, lumbar spine have good range of motion.  No midline spinal tenderness.  No SI joint tenderness.  Shoulder joints, elbow joints, wrist joints, MCPs, PIPs, DIPs have good range of motion with no synovitis.  Complete fist formation bilaterally.  Hip joints have good range of motion with no groin pain.  Knee joints have good range of motion no warmth or effusion.  Ankle joints have good range of motion no tenderness or joint swelling.  No evidence of Achilles tendinitis or plantar fasciitis.   CDAI Exam: CDAI Score: -- Patient Global: --; Provider Global: -- Swollen: --; Tender: -- Joint Exam 02/19/2024   No joint exam has been documented for this visit   There is currently no information documented on the homunculus. Go to the Rheumatology activity  and complete the homunculus joint exam.  Investigation: No additional findings.  Imaging: No results found.  Recent Labs: Lab Results  Component Value Date   WBC 5.8 12/15/2023   HGB 15.8 (H) 12/15/2023   PLT 262 12/15/2023   NA 138 12/15/2023   K 4.5 12/15/2023   CL 102 12/15/2023   CO2 29 12/15/2023   GLUCOSE 90 12/15/2023   BUN 15 12/15/2023   CREATININE 0.81 12/15/2023   BILITOT 0.4 12/15/2023   ALKPHOS 22 (L) 11/22/2022   AST 24 12/15/2023   ALT 26 12/15/2023   PROT 7.4 12/15/2023   ALBUMIN 4.4 11/22/2022   CALCIUM 9.5 12/15/2023   GFRAA 119 07/29/2019   QFTBGOLDPLUS NEGATIVE 07/29/2019    Speciality Comments: PLQ eye exam: 10/22/2022 WNL. [facility]. Follow up in 1 year.  PLQ eye exam scheduled: 03/01/2024  Procedures:  No procedures performed Allergies: Depakote [divalproex sodium], Lactose, and Lactose intolerance (gi)  Lab work from 12/15/2023 was reviewed today in the office: Complements within normal limits, double-stranded DNA negative, ESR 6.   Assessment / Plan:     Visit Diagnoses: Undifferentiated connective tissue disease - Positive ANA, positive SCL 70, aCL 42, fatigue, inflammatory arthritis (pictures noted cell phone showed MCP, left ankle joint swelling and rash in the past): Patient remains on Plaquenil  200 mg 1 tablet by mouth twice daily and is taking Imuran  50 mg 1 tablet daily.  Imuran  was added in mid October.  She has been tolerating Imuran  without any side effects but has not yet noticed any clinical benefit.  Her energy level has improved but she is unsure if it is related to the use of Imuran .  She has not noticed any improvement in the joint pain and stiffness she was experiencing prior to initiating Imuran .  Discussed the option of giving Imuran  more time at the current dose versus increasing to 75 mg with close monitoring.  Patient opted to increase the dose of Imuran   to 75 mg daily.  Plan to update CBC and CMP today and then again in 2  weeks.  She will remain on Plaquenil  as prescribed. She will follow-up in the office in 3 months or sooner if needed.  Plan to update autoimmune lab work at the next visit.    High risk medication use - Plaquenil  200 mg 1 tablet by mouth twice daily and Imuran  75 mg by mouth daily.  CBC and CMP updated on 12/15/23.  Orders for CBC and CMP were released today.  Her next lab work will be due in 2-3 weeks after increasing the dose of Imuran . PLQ eye exam: 10/22/2022 WNL. [facility]. Follow up in 1 year.  She had an eye exam on Monday but has the full field of vision testing on 03/01/24.    - Plan: CBC with Differential/Platelet, Comprehensive metabolic panel with GFR, CBC with Differential/Platelet, Comprehensive metabolic panel with GFR  Pain in both hands: Patient continues to experience pain and stiffness in both hands.  She has not yet noticed any benefit since adding on Imuran  50 mg 1 tablet daily.  Discussed the option of increasing to 75 mg daily with close monitoring.  She was in agreement.  Lateral epicondylitis of both elbows: Patient continues to have discomfort and stiffness in both elbows.  Chondromalacia of both patellae: No warmth or effusion noted.  Hypermobility of joint  Myofascial pain: Patient continues to have myalgias and muscle tenderness due to myofascial pain.    Vitamin D  deficiency: Vitamin D  48 on 12/15/2023.  Raynaud's syndrome without gangrene: Patient continues have intermittent symptoms of Raynaud's phenomenon.  No digital ulcerations or signs of sclerodactyly noted.  Other medical conditions are listed as follows:   Hair loss  Other fatigue  Vitamin B12 deficiency  Generalized anxiety disorder  Panic disorder  History of migraine  History of IBS  Family history of Crohn's disease  Family history of ulcerative colitis  Anticardiolipin antibody positive  Orders: Orders Placed This Encounter  Procedures   CBC with Differential/Platelet    Comprehensive metabolic panel with GFR   CBC with Differential/Platelet   Comprehensive metabolic panel with GFR   No orders of the defined types were placed in this encounter.    Follow-Up Instructions: Return in about 3 months (around 05/19/2024).   Waddell CHRISTELLA Craze, PA-C  Note - This record has been created using Dragon software.  Chart creation errors have been sought, but may not always  have been located. Such creation errors do not reflect on  the standard of medical care.

## 2024-02-06 DIAGNOSIS — Z124 Encounter for screening for malignant neoplasm of cervix: Secondary | ICD-10-CM | POA: Diagnosis not present

## 2024-02-06 DIAGNOSIS — Z6831 Body mass index (BMI) 31.0-31.9, adult: Secondary | ICD-10-CM | POA: Diagnosis not present

## 2024-02-06 DIAGNOSIS — Z01419 Encounter for gynecological examination (general) (routine) without abnormal findings: Secondary | ICD-10-CM | POA: Diagnosis not present

## 2024-02-16 ENCOUNTER — Telehealth: Payer: Self-pay | Admitting: Adult Health

## 2024-02-16 DIAGNOSIS — Z79899 Other long term (current) drug therapy: Secondary | ICD-10-CM | POA: Diagnosis not present

## 2024-02-16 DIAGNOSIS — J988 Other specified respiratory disorders: Secondary | ICD-10-CM | POA: Diagnosis not present

## 2024-02-16 DIAGNOSIS — J029 Acute pharyngitis, unspecified: Secondary | ICD-10-CM | POA: Diagnosis not present

## 2024-02-16 DIAGNOSIS — Z20822 Contact with and (suspected) exposure to covid-19: Secondary | ICD-10-CM | POA: Diagnosis not present

## 2024-02-16 LAB — OPHTHALMOLOGY REPORT-SCANNED

## 2024-02-16 NOTE — Telephone Encounter (Signed)
 Pt called to request if she can be seen earlier tomorrow due to she has to pick up her kids from school . Informed Pt that NP didn't  have any availability for morning to morrow  the patient requested to messaged Np to see if she can get in earlier

## 2024-02-16 NOTE — Telephone Encounter (Signed)
 Noted

## 2024-02-16 NOTE — Telephone Encounter (Signed)
 Called pt and informed her that there was no earlier appt. Pt stated that she will make it work.

## 2024-02-17 ENCOUNTER — Ambulatory Visit: Admitting: Adult Health

## 2024-02-17 VITALS — BP 138/79 | HR 89

## 2024-02-17 DIAGNOSIS — G43711 Chronic migraine without aura, intractable, with status migrainosus: Secondary | ICD-10-CM

## 2024-02-17 MED ORDER — ONABOTULINUMTOXINA 200 UNITS IJ SOLR
155.0000 [IU] | Freq: Once | INTRAMUSCULAR | Status: AC
  Administered 2024-02-17: 155 [IU] via INTRAMUSCULAR

## 2024-02-17 NOTE — Progress Notes (Signed)
 02/17/24: Patient states that the combination of Botox  and Qulipta  continue to work well for her.  She states that she can tell that Botox  has worn off because she has got more headaches but not migraines.  She returns today for Botox  injections.  11/18/23: Patient states overall Botox  is working really well for her.  She states that she has not had to use her preventative at all.  Only use over-the-counter Motrin .  She remains on Qulipta .  We normally do Toradol  after her Botox  procedure to prevent rebound headaches however she recently had a colonoscopy that showed some inflamed spots at the physician felt it was due to her ibuprofen  usage.  Patient has a follow-up in October with her GI physician.  She will discuss with her.   08/19/23: Patient reports that overall she feels that her migraines have remained stable.  She reports that she only had 3 migraines since the last Botox  injections.  She does get mild headaches as well.  She states that those have increased in the last couple of weeks.  But are manageable with over-the-counter medication.  She continues on Qulipta  and Ubrelvy .  She does get Toradol  before Botox  procedure to prevent rebound headaches after getting Botox .  Today we will give 30 mg IM to see if that is beneficial.  She denies any kidney disease.  05/14/23: only one migraine in 2025.  Remains on Qulipta  daily and Ubrelvy  for abortive therapy.  She is considering a third pregnancy but is not fully decided.  We did review medication options today.  Patient would like her masseters injected today.  Dr. Darleen came in and injected both masseters 5 units each.  Dr. Ines approved her getting Toradol  at her Botox  procedure to prevent a migraine from coming on after the injections.  She received Toradol  today  02/18/23: Botox  is working well.  No migraines in the month of November.  We did not inject masseters today we will reevaluate at the next visit.  11/27/22: reports that botox  has worked  well. Only had 1 migraine in the last month. Dr. Ines has been doing botox  every 8 weeks. See has also been getting toradol  after botox  to prevent a migraine from coming after injections. Did not inject masseters today.    BOTOX  PROCEDURE NOTE FOR MIGRAINE HEADACHE    Contraindications and precautions discussed with patient(above). Aseptic procedure was observed and patient tolerated procedure. Procedure performed by Duwaine Russell, NP  The condition has existed for more than 6 months, and pt does not have a diagnosis of ALS, Myasthenia Gravis or Lambert-Eaton Syndrome.  Risks and benefits of injections discussed and pt agrees to proceed with the procedure.  Written consent obtained  These injections do not cause sedations or hallucinations which the oral therapies may cause.  Indication/Diagnosis: chronic migraine BOTOX (G9414) injection was performed according to protocol by [employer]. 200 units of BOTOX  was dissolved into 4 cc NS.   NDC: [address]-98  Type of toxin: Botox  Botox - 200 units x 1 vial Lot: [id number] Expiration: 08/2025 NDC: 9976-6078-97   Bacteriostatic 0.9% Sodium Chloride - 4 mL  Lot: [id number] Expiration: 01/15/2025 NDC: 9590-8033-97   Dx: H56.288          Description of procedure:  The patient was placed in a sitting position. The standard protocol was used for Botox  as follows, with 5 units of Botox  injected at each site:   -Procerus muscle, midline injection  -Corrugator muscle, bilateral injection  -Frontalis muscle, bilateral injection, with 2 sites  each side, medial injection was performed in the upper one third of the frontalis muscle, in the region vertical from the medial inferior edge of the superior orbital rim. The lateral injection was again in the upper one third of the forehead vertically above the lateral limbus of the cornea, 1.5 cm lateral to the medial injection site.  -Temporalis muscle injection, 4 sites, bilaterally. The first injection  was 3 cm above the tragus of the ear, second injection site was 1.5 cm to 3 cm up from the first injection site in line with the tragus of the ear. The third injection site was 1.5-3 cm forward between the first 2 injection sites. The fourth injection site was 1.5 cm posterior to the second injection site.  -Occipitalis muscle injection, 3 sites, bilaterally. The first injection was done one half way between the occipital protuberance and the tip of the mastoid process behind the ear. The second injection site was done lateral and superior to the first, 1 fingerbreadth from the first injection. The third injection site was 1 fingerbreadth superiorly and medially from the first injection site.  -Cervical paraspinal muscle injection, 2 sites, bilateral knee first injection site was 1 cm from the midline of the cervical spine, 3 cm inferior to the lower border of the occipital protuberance. The second injection site was 1.5 cm superiorly and laterally to the first injection site.  -Trapezius muscle injection was performed at 3 sites, bilaterally. The first injection site was in the upper trapezius muscle halfway between the inflection point of the neck, and the acromion. The second injection site was one half way between the acromion and the first injection site. The third injection was done between the first injection site and the inflection point of the neck.     Will return for repeat injection in 3 months.   A 200 unit sof Botox  was used, 155 units were injected, the rest of the Botox  was wasted. The patient tolerated the procedure well, there were no complications of the above procedure.  Duwaine Russell, MSN, NP-C 02/17/2024, 3:48 PM [facility] [address] [place], [place] [address] [phone]

## 2024-02-17 NOTE — Progress Notes (Signed)
 Botox - 200 units x 1 vial Lot: [id number] Expiration: 08/2025 NDC: 9976-6078-97   Bacteriostatic 0.9% Sodium Chloride - 4 mL  Lot: [id number] Expiration: 01/15/2025 NDC: 9590-8033-97   Dx: H56.288 SP Witnessed by Sherrod BIRCH

## 2024-02-18 ENCOUNTER — Other Ambulatory Visit: Payer: Self-pay | Admitting: Physician Assistant

## 2024-02-18 DIAGNOSIS — M359 Systemic involvement of connective tissue, unspecified: Secondary | ICD-10-CM

## 2024-02-18 DIAGNOSIS — L659 Nonscarring hair loss, unspecified: Secondary | ICD-10-CM

## 2024-02-18 NOTE — Telephone Encounter (Signed)
 Last Fill: 01/19/2024 (30 day supply)  Eye exam: 10/22/2022   Labs: 12/15/2023  No proteinuria.   Hemoglobin and hematocrit are slightly elevated- we will continue to monitor.  Rest of CBC WNL.   Vitamin D  WNL TSH WNL ESR WNL CMP WNL Iron panel WNL Complements within normal limits. dsDNA is negative ANA remains positive and is a higher titer. Pattern unchanged.   Next Visit: 02/19/2024  Last Visit: 12/15/2023  IK:Lwipqqzmzwupjuzi connective tissue disease   Current Dose per office note on 12/15/2023: Plaquenil  200 mg 1 tablet by mouth twice daily.   Attempted to contact patient and left message on machine regarding updated PLQ eye exam.   Okay to refill Plaquenil ?

## 2024-02-19 ENCOUNTER — Ambulatory Visit: Attending: Physician Assistant | Admitting: Physician Assistant

## 2024-02-19 ENCOUNTER — Encounter: Payer: Self-pay | Admitting: Physician Assistant

## 2024-02-19 ENCOUNTER — Other Ambulatory Visit: Payer: Self-pay

## 2024-02-19 VITALS — BP 136/85 | HR 92 | Temp 97.9°F | Resp 15 | Ht 68.0 in | Wt 210.6 lb

## 2024-02-19 DIAGNOSIS — M359 Systemic involvement of connective tissue, unspecified: Secondary | ICD-10-CM

## 2024-02-19 DIAGNOSIS — Z79899 Other long term (current) drug therapy: Secondary | ICD-10-CM | POA: Diagnosis not present

## 2024-02-19 DIAGNOSIS — M79641 Pain in right hand: Secondary | ICD-10-CM | POA: Diagnosis not present

## 2024-02-19 DIAGNOSIS — M7712 Lateral epicondylitis, left elbow: Secondary | ICD-10-CM

## 2024-02-19 DIAGNOSIS — F410 Panic disorder [episodic paroxysmal anxiety] without agoraphobia: Secondary | ICD-10-CM

## 2024-02-19 DIAGNOSIS — F411 Generalized anxiety disorder: Secondary | ICD-10-CM

## 2024-02-19 DIAGNOSIS — Z8379 Family history of other diseases of the digestive system: Secondary | ICD-10-CM

## 2024-02-19 DIAGNOSIS — L659 Nonscarring hair loss, unspecified: Secondary | ICD-10-CM

## 2024-02-19 DIAGNOSIS — M249 Joint derangement, unspecified: Secondary | ICD-10-CM

## 2024-02-19 DIAGNOSIS — M7711 Lateral epicondylitis, right elbow: Secondary | ICD-10-CM | POA: Diagnosis not present

## 2024-02-19 DIAGNOSIS — R5383 Other fatigue: Secondary | ICD-10-CM

## 2024-02-19 DIAGNOSIS — Z8669 Personal history of other diseases of the nervous system and sense organs: Secondary | ICD-10-CM

## 2024-02-19 DIAGNOSIS — R760 Raised antibody titer: Secondary | ICD-10-CM

## 2024-02-19 DIAGNOSIS — E559 Vitamin D deficiency, unspecified: Secondary | ICD-10-CM

## 2024-02-19 DIAGNOSIS — M79642 Pain in left hand: Secondary | ICD-10-CM

## 2024-02-19 DIAGNOSIS — Z8719 Personal history of other diseases of the digestive system: Secondary | ICD-10-CM

## 2024-02-19 DIAGNOSIS — M7918 Myalgia, other site: Secondary | ICD-10-CM

## 2024-02-19 DIAGNOSIS — E538 Deficiency of other specified B group vitamins: Secondary | ICD-10-CM

## 2024-02-19 DIAGNOSIS — M2242 Chondromalacia patellae, left knee: Secondary | ICD-10-CM

## 2024-02-19 DIAGNOSIS — I7300 Raynaud's syndrome without gangrene: Secondary | ICD-10-CM

## 2024-02-19 DIAGNOSIS — M2241 Chondromalacia patellae, right knee: Secondary | ICD-10-CM

## 2024-02-19 MED ORDER — AZATHIOPRINE 50 MG PO TABS: 75.0000 mg | ORAL_TABLET | Freq: Every day | ORAL | 0 refills | Status: AC

## 2024-02-19 NOTE — Patient Instructions (Signed)
 Standing Labs We placed an order today for your standing lab work.   Please have your standing labs drawn in 2 weeks   Please have your labs drawn 2 weeks prior to your appointment so that the provider can discuss your lab results at your appointment, if possible.  Please note that you may see your imaging and lab results in MyChart before we have reviewed them. We will contact you once all results are reviewed. Please allow our office up to 72 hours to thoroughly review all of the results before contacting the office for clarification of your results.  WALK-IN LAB HOURS  Monday through Thursday from 8:00 am - 4:30 pm and Friday from 8:00 am-12:00 pm.  Patients with office visits requiring labs will be seen before walk-in labs.  You may encounter longer than normal wait times. Please allow additional time. Wait times may be shorter on  Monday and Thursday afternoons.  We do not book appointments for walk-in labs. We appreciate your patience and understanding with our staff.   Labs are drawn by [facility]. Please bring your co-pay at the time of your lab draw.  You may receive a bill from [facility] for your lab work.  Please note if you are on Hydroxychloroquine  and and an order has been placed for a Hydroxychloroquine  level,  you will need to have it drawn 4 hours or more after your last dose.  If you wish to have your labs drawn at another location, please call the office 24 hours in advance so we can fax the orders.  The office is located at [address], [place], [place] [address]   If you have any questions regarding directions or hours of operation,  please call [phone].   As a reminder, please drink plenty of water prior to coming for your lab work. Thanks!

## 2024-02-19 NOTE — Telephone Encounter (Signed)
 Please review and sign the pended no print Imuran  rx to reflect dose change that you advised today. Thanks!

## 2024-02-20 ENCOUNTER — Ambulatory Visit: Payer: Self-pay | Admitting: Rheumatology

## 2024-02-20 LAB — COMPREHENSIVE METABOLIC PANEL WITH GFR
AG Ratio: 1.6 (calc) (ref 1.0–2.5)
ALT: 17 U/L (ref 6–29)
AST: 19 U/L (ref 10–30)
Albumin: 4.6 g/dL (ref 3.6–5.1)
Alkaline phosphatase (APISO): 24 U/L — ABNORMAL LOW (ref 31–125)
BUN: 12 mg/dL (ref 7–25)
CO2: 28 mmol/L (ref 20–32)
Calcium: 9.6 mg/dL (ref 8.6–10.2)
Chloride: 103 mmol/L (ref 98–110)
Creat: 0.84 mg/dL (ref 0.50–0.97)
Globulin: 2.8 g/dL (ref 1.9–3.7)
Glucose, Bld: 59 mg/dL — ABNORMAL LOW (ref 65–99)
Potassium: 4.1 mmol/L (ref 3.5–5.3)
Sodium: 139 mmol/L (ref 135–146)
Total Bilirubin: 0.7 mg/dL (ref 0.2–1.2)
Total Protein: 7.4 g/dL (ref 6.1–8.1)
eGFR: 93 mL/min/1.73m2 (ref >=60)

## 2024-02-20 LAB — CBC WITH DIFFERENTIAL/PLATELET
Absolute Lymphocytes: 1721 {cells}/uL (ref 850–3900)
Absolute Monocytes: 260 {cells}/uL (ref 200–950)
Basophils Absolute: 62 {cells}/uL (ref 0–200)
Basophils Relative: 1.2 %
Eosinophils Absolute: 73 {cells}/uL (ref 15–500)
Eosinophils Relative: 1.4 %
HCT: 46.7 % — ABNORMAL HIGH (ref 35.9–46.0)
Hemoglobin: 15.5 g/dL (ref 11.7–15.5)
MCH: 31.3 pg (ref 27.0–33.0)
MCHC: 33.2 g/dL (ref 31.6–35.4)
MCV: 94.3 fL (ref 81.4–101.7)
MPV: 12.3 fL (ref 7.5–12.5)
Monocytes Relative: 5 %
Neutro Abs: 3084 {cells}/uL (ref 1500–7800)
Neutrophils Relative %: 59.3 %
Platelets: 261 Thousand/uL (ref 140–400)
RBC: 4.95 Million/uL (ref 3.80–5.10)
RDW: 12.3 % (ref 11.0–15.0)
Total Lymphocyte: 33.1 %
WBC: 5.2 Thousand/uL (ref 3.8–10.8)

## 2024-02-20 NOTE — Progress Notes (Signed)
 CBC and CMP are stable.

## 2024-03-01 DIAGNOSIS — Z79899 Other long term (current) drug therapy: Secondary | ICD-10-CM | POA: Diagnosis not present

## 2024-03-02 ENCOUNTER — Encounter: Payer: Self-pay | Admitting: Pediatrics

## 2024-03-02 DIAGNOSIS — F4322 Adjustment disorder with anxiety: Secondary | ICD-10-CM | POA: Diagnosis not present

## 2024-03-03 MED ORDER — TRULANCE 3 MG PO TABS: 3.0000 mg | ORAL_TABLET | Freq: Every day | ORAL | 3 refills | Status: AC

## 2024-03-03 NOTE — Addendum Note (Signed)
 Addended by: KOLEEN PERKINS F on: 03/03/2024 12:58 PM   Modules accepted: Orders

## 2024-03-09 DIAGNOSIS — J029 Acute pharyngitis, unspecified: Secondary | ICD-10-CM | POA: Diagnosis not present

## 2024-03-09 DIAGNOSIS — M791 Myalgia, unspecified site: Secondary | ICD-10-CM | POA: Diagnosis not present

## 2024-03-16 DIAGNOSIS — F4322 Adjustment disorder with anxiety: Secondary | ICD-10-CM | POA: Diagnosis not present

## 2024-03-22 ENCOUNTER — Other Ambulatory Visit: Payer: Self-pay | Admitting: Physician Assistant

## 2024-03-22 DIAGNOSIS — L659 Nonscarring hair loss, unspecified: Secondary | ICD-10-CM

## 2024-03-22 DIAGNOSIS — M359 Systemic involvement of connective tissue, unspecified: Secondary | ICD-10-CM

## 2024-03-22 NOTE — Telephone Encounter (Signed)
 Last Fill: 02/18/2024   Eye exam: 10/22/2022 WNL   Labs:02/19/2024 CBC and CMP are stable.   Next Visit: 05/20/2024  Last Visit: 02/19/2024  DX: Undifferentiated connective tissue disease   Current Dose per office note 02/19/2024: Plaquenil  200 mg 1 tablet by mouth twice daily   Attempted to contact the patient and left a message to call the office back regarding her PLQ eye exam.   Okay to refill Plaquenil ?

## 2024-03-29 ENCOUNTER — Other Ambulatory Visit: Payer: Self-pay

## 2024-03-29 MED ORDER — QULIPTA 60 MG PO TABS: 1.0000 | ORAL_TABLET | Freq: Every day | ORAL | 2 refills | Status: AC

## 2024-04-16 ENCOUNTER — Other Ambulatory Visit: Payer: Self-pay

## 2024-04-16 ENCOUNTER — Encounter: Payer: Self-pay | Admitting: Pediatrics

## 2024-04-16 ENCOUNTER — Telehealth: Payer: Self-pay

## 2024-04-16 MED ORDER — RIFAXIMIN 550 MG PO TABS: 550.0000 mg | ORAL_TABLET | Freq: Two times a day (BID) | ORAL | 0 refills | Status: DC

## 2024-04-16 MED ORDER — RIFAXIMIN 550 MG PO TABS: 550.0000 mg | ORAL_TABLET | Freq: Three times a day (TID) | ORAL | 0 refills | Status: AC

## 2024-04-16 NOTE — Telephone Encounter (Signed)
 See phone note from today. Called & clarified with the patient the correct dosing.

## 2024-04-16 NOTE — Telephone Encounter (Signed)
-----   Message from Inocente Hausen, MD sent at 04/16/2024  1:25 PM EST ----- Regarding: Update pharmacy and patient on Xifaxan  dosing correction In the MyChart message I sent to Jennifer Vincent this morning I made an error and prescribed Xifaxan  550 mg p.o. twice daily rather than 3 times daily.    Please contact the pharmacy to have the prescription edited to TID and let the patient know that the dose is being adjusted so she is not confused between discrepancies in the MyChart message in her prescription bottle.  Thanks,  Inocente

## 2024-04-16 NOTE — Telephone Encounter (Addendum)
 Pharmacy is currently closed. Updated medication in epic & have left message for patient to call back. Will continue to try and reach pharmacy this afternoon.

## 2024-04-16 NOTE — Telephone Encounter (Signed)
 Spoke with the pharmacy & they have the updated prescription and have canceled the previous one. They have enough for patient to have a 5 day supply & then will give her more on Monday. Pt has been made aware of the update & verbalized all understanding.

## 2024-04-19 ENCOUNTER — Other Ambulatory Visit: Payer: Self-pay | Admitting: Physician Assistant

## 2024-04-19 DIAGNOSIS — L659 Nonscarring hair loss, unspecified: Secondary | ICD-10-CM

## 2024-04-19 DIAGNOSIS — M359 Systemic involvement of connective tissue, unspecified: Secondary | ICD-10-CM

## 2024-04-19 NOTE — Telephone Encounter (Signed)
 Last Fill: 03/23/2023  Eye exam: 02/16/2024   Labs: 02/19/2024 CBC and CMP are stable.   Next Visit: 05/20/2024  Last Visit: 02/19/2024  DX: Undifferentiated connective tissue disease   Current Dose per office note on 02/19/2024: Plaquenil  200 mg 1 tablet by mouth twice daily   Okay to refill Plaquenil ?

## 2024-04-26 ENCOUNTER — Encounter: Admitting: Physician Assistant

## 2024-05-18 ENCOUNTER — Ambulatory Visit: Admitting: Adult Health

## 2024-05-20 ENCOUNTER — Ambulatory Visit: Admitting: Physician Assistant
